# Patient Record
Sex: Female | Born: 1990 | Race: Black or African American | Hispanic: No | Marital: Single | Smoking: Former smoker
Health system: Southern US, Community
[De-identification: ages and names within clinical notes are randomized; demographics above are authoritative.]

## PROBLEM LIST (undated history)

## (undated) DIAGNOSIS — J45909 Unspecified asthma, uncomplicated: Secondary | ICD-10-CM

## (undated) DIAGNOSIS — R072 Precordial pain: Secondary | ICD-10-CM

## (undated) DIAGNOSIS — G919 Hydrocephalus, unspecified: Secondary | ICD-10-CM

## (undated) DIAGNOSIS — I619 Nontraumatic intracerebral hemorrhage, unspecified: Secondary | ICD-10-CM

## (undated) DIAGNOSIS — I509 Heart failure, unspecified: Secondary | ICD-10-CM

## (undated) DIAGNOSIS — Q282 Arteriovenous malformation of cerebral vessels: Secondary | ICD-10-CM

## (undated) DIAGNOSIS — I671 Cerebral aneurysm, nonruptured: Secondary | ICD-10-CM

## (undated) DIAGNOSIS — I639 Cerebral infarction, unspecified: Secondary | ICD-10-CM

## (undated) HISTORY — PX: VENTRICULOPERITONEAL SHUNT: SHX204

## (undated) HISTORY — DX: Arteriovenous malformation of cerebral vessels: Q28.2

## (undated) HISTORY — DX: Nontraumatic intracerebral hemorrhage, unspecified: I61.9

## (undated) HISTORY — DX: Precordial pain: R07.2

## (undated) HISTORY — DX: Cerebral infarction, unspecified: I63.9

## (undated) HISTORY — DX: Hydrocephalus, unspecified: G91.9

---
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
## 2010-02-18 ENCOUNTER — Emergency Department: Payer: Self-pay | Admitting: Emergency Medicine

## 2010-09-20 ENCOUNTER — Emergency Department: Payer: Self-pay | Admitting: Emergency Medicine

## 2011-01-15 ENCOUNTER — Emergency Department: Payer: Self-pay | Admitting: Unknown Physician Specialty

## 2012-12-25 ENCOUNTER — Emergency Department: Payer: Self-pay | Admitting: Emergency Medicine

## 2013-04-14 ENCOUNTER — Emergency Department: Payer: Self-pay | Admitting: Emergency Medicine

## 2014-02-27 ENCOUNTER — Emergency Department: Payer: Self-pay | Admitting: Emergency Medicine

## 2014-08-06 ENCOUNTER — Emergency Department: Payer: Self-pay | Admitting: Emergency Medicine

## 2014-09-19 ENCOUNTER — Emergency Department: Payer: Self-pay | Admitting: Emergency Medicine

## 2014-09-19 LAB — BASIC METABOLIC PANEL
Anion Gap: 7 (ref 7–16)
BUN: 13 mg/dL (ref 7–18)
CALCIUM: 8.2 mg/dL — AB (ref 8.5–10.1)
Chloride: 105 mmol/L (ref 98–107)
Co2: 28 mmol/L (ref 21–32)
Creatinine: 0.79 mg/dL (ref 0.60–1.30)
EGFR (Non-African Amer.): >60
Glucose: 90 mg/dL (ref 65–99)
Osmolality: 279 (ref 275–301)
Potassium: 3.7 mmol/L (ref 3.5–5.1)
SODIUM: 140 mmol/L (ref 136–145)

## 2014-09-19 LAB — CBC
HCT: 40.6 % (ref 35.0–47.0)
HGB: 13.4 g/dL (ref 12.0–16.0)
MCH: 29.7 pg (ref 26.0–34.0)
MCHC: 33.1 g/dL (ref 32.0–36.0)
MCV: 90 fL (ref 80–100)
Platelet: 284 10*3/uL (ref 150–440)
RBC: 4.52 10*6/uL (ref 3.80–5.20)
RDW: 13.3 % (ref 11.5–14.5)
WBC: 8.8 10*3/uL (ref 3.6–11.0)

## 2014-09-19 LAB — TROPONIN I: Troponin-I: <0.02 ng/mL

## 2015-04-18 ENCOUNTER — Encounter: Payer: Self-pay | Admitting: Emergency Medicine

## 2015-04-18 ENCOUNTER — Emergency Department
Admission: EM | Admit: 2015-04-18 | Discharge: 2015-04-18 | Disposition: A | Discharge status: Home / Self Care | Payer: Self-pay | Attending: Emergency Medicine | Admitting: Emergency Medicine

## 2015-04-18 ENCOUNTER — Emergency Department: Payer: Self-pay

## 2015-04-18 DIAGNOSIS — R519 Headache, unspecified: Secondary | ICD-10-CM

## 2015-04-18 DIAGNOSIS — R51 Headache: Secondary | ICD-10-CM | POA: Insufficient documentation

## 2015-04-18 DIAGNOSIS — M542 Cervicalgia: Secondary | ICD-10-CM | POA: Insufficient documentation

## 2015-04-18 HISTORY — DX: Heart failure, unspecified: I50.9

## 2015-04-18 HISTORY — DX: Cerebral aneurysm, nonruptured: I67.1

## 2015-04-18 MED ORDER — DIPHENHYDRAMINE HCL 50 MG/ML IJ SOLN
25.0000 mg | Freq: Once | INTRAMUSCULAR | Status: AC
  Administered 2015-04-18: 25 mg via INTRAVENOUS

## 2015-04-18 MED ORDER — DIPHENHYDRAMINE HCL 50 MG/ML IJ SOLN
INTRAMUSCULAR | Status: AC
  Filled 2015-04-18: qty 1

## 2015-04-18 MED ORDER — METOCLOPRAMIDE HCL 5 MG/ML IJ SOLN: 20.0000 mg | Freq: Once | INTRAVENOUS | Status: AC

## 2015-04-18 MED ORDER — METOCLOPRAMIDE HCL 5 MG/ML IJ SOLN
INTRAMUSCULAR | Status: AC
  Administered 2015-04-18: 20 mg
  Filled 2015-04-18: qty 4

## 2015-04-18 NOTE — ED Notes (Signed)
Pt reports that she has had a headache since Friday, now neck is hurting. Denies N/V/D or fever.

## 2015-04-18 NOTE — ED Notes (Signed)
Pt ambulating independently w/ steady gait on d/c in no acute distress, A&Ox4. D/c instructions reviewed w/ pt - pt denies any further questions or concerns at present.  

## 2015-04-18 NOTE — ED Provider Notes (Signed)
[facility] Emergency Department Provider Note  ____________________________________________  Time seen: On arrival  I have reviewed the triage vital signs and the nursing notes.   HISTORY  Chief Complaint Headache      HPI Anna Tapia is a 24 y.o. female who presents with a headache for 3 days. Patient reports a history of similar headaches several times but this when she "cannot shake ". She has a very distant history of a brain aneurysm. She denies any neuro deficits. No fevers no chills. No nausea no vomiting. No change in vision. No sick contacts. She describes the headache as global with pressure behind her eyes which is how it always is. It is moderate in nature     Past Medical History  Diagnosis Date  . CHF (congestive heart failure)   . Brain aneurysm     There are no active problems to display for this patient.   History reviewed. No pertinent past surgical history.  No current outpatient prescriptions on file.  Allergies Review of patient's allergies indicates no known allergies.  History reviewed. No pertinent family history.  Social History History  Substance Use Topics  . Smoking status: Never Smoker   . Smokeless tobacco: Not on file  . Alcohol Use: No    Review of Systems  Constitutional: Negative for fever. Eyes: Negative for visual changes. ENT: Negative for sore throat Cardiovascular: Negative for chest pain. Respiratory: Negative for shortness of breath. Gastrointestinal: Negative for abdominal pain, vomiting and diarrhea. Genitourinary: Negative for dysuria. Musculoskeletal: Negative for back pain. Positive for neck pain Skin: Negative for rash. Neurological: Negative for focal weakness, positive for headache   10-point ROS otherwise negative.  ____________________________________________   PHYSICAL EXAM:  VITAL SIGNS: ED Triage Vitals  Enc Vitals Group     BP 04/18/15 1500 115/70 mmHg   Pulse Rate 04/18/15 1500 76     Resp 04/18/15 1500 18     Temp --      Temp src --      SpO2 04/18/15 1500 99 %     Weight --      Height --      Head Cir --      Peak Flow --      Pain Score 04/18/15 1523 9     Pain Loc --      Pain Edu? --      Excl. in GC? --      Constitutional: Alert and oriented. Well appearing and in no distress. Eyes: Conjunctivae are normal. PERRL. ENT   Head: Normocephalic and atraumatic.   Nose: No rhinnorhea.   Mouth/Throat: Mucous membranes are moist. Cardiovascular: Normal rate, regular rhythm. Normal and symmetric distal pulses are present in all extremities. No murmurs, rubs, or gallops. Respiratory: Normal respiratory effort without tachypnea nor retractions. Breath sounds are clear and equal bilaterally.  Gastrointestinal: Soft and non-tender in all quadrants. No distention. There is no CVA tenderness. Genitourinary: deferred Musculoskeletal: Nontender with normal range of motion in all extremities. No lower extremity tenderness nor edema. Normal range of motion of her neck although she does have some tenderness to palpation at the insertion sites of the trapezius muscle bilaterally.  Neurologic:  Normal speech and language. No gross focal neurologic deficits are appreciated. Skin:  Skin is warm, dry and intact. No rash noted. Psychiatric: Mood and affect are normal. Patient exhibits appropriate insight and judgment.  ____________________________________________    LABS (pertinent positives/negatives)  Labs Reviewed - No data to  display  ____________________________________________   EKG  None  ____________________________________________    RADIOLOGY  CT with no acute distress  ____________________________________________   PROCEDURES  Procedure(s) performed: none  Critical Care performed: none  ____________________________________________   INITIAL IMPRESSION / ASSESSMENT AND PLAN / ED COURSE  Pertinent  labs & imaging results that were available during my care of the patient were reviewed by me and considered in my medical decision making (see chart for details).  Overall patient well-appearing. Neurologically completely intact. Given history we will obtain CT. We will treat with IV medications. ----------------------------------------- 6:11 PM on 04/18/2015 -----------------------------------------  Patient resting lightly in room. She reports her headache is much better ____________________________________________ ----------------------------------------- 7:04 PM on 04/18/2015 -----------------------------------------  Patient and/or asking to go home. She reports her headache is completely resolved  FINAL CLINICAL IMPRESSION(S) / ED DIAGNOSES  Final diagnoses:  Acute nonintractable headache, unspecified headache type     Jene Every, MD 04/18/15 1904

## 2015-04-18 NOTE — Discharge Instructions (Signed)

## 2015-12-04 ENCOUNTER — Emergency Department: Payer: Self-pay

## 2015-12-04 ENCOUNTER — Emergency Department
Admission: EM | Admit: 2015-12-04 | Discharge: 2015-12-04 | Disposition: A | Discharge status: Home / Self Care | Payer: Self-pay | Attending: Emergency Medicine | Admitting: Emergency Medicine

## 2015-12-04 DIAGNOSIS — R0789 Other chest pain: Secondary | ICD-10-CM | POA: Insufficient documentation

## 2015-12-04 DIAGNOSIS — I509 Heart failure, unspecified: Secondary | ICD-10-CM | POA: Insufficient documentation

## 2015-12-04 LAB — BASIC METABOLIC PANEL
ANION GAP: 7 (ref 5–15)
BUN: 7 mg/dL (ref 6–20)
CALCIUM: 8.9 mg/dL (ref 8.9–10.3)
CO2: 25 mmol/L (ref 22–32)
CREATININE: 0.71 mg/dL (ref 0.44–1.00)
Chloride: 106 mmol/L (ref 101–111)
GFR calc Af Amer: >60 mL/min (ref >60)
Glucose, Bld: 79 mg/dL (ref 65–99)
Potassium: 3.4 mmol/L — ABNORMAL LOW (ref 3.5–5.1)
Sodium: 138 mmol/L (ref 135–145)

## 2015-12-04 LAB — CBC
HCT: 41.5 % (ref 35.0–47.0)
Hemoglobin: 13.5 g/dL (ref 12.0–16.0)
MCH: 29.1 pg (ref 26.0–34.0)
MCHC: 32.6 g/dL (ref 32.0–36.0)
MCV: 89.2 fL (ref 80.0–100.0)
PLATELETS: 221 10*3/uL (ref 150–440)
RBC: 4.65 MIL/uL (ref 3.80–5.20)
RDW: 13.1 % (ref 11.5–14.5)
WBC: 5.2 10*3/uL (ref 3.6–11.0)

## 2015-12-04 LAB — TROPONIN I

## 2015-12-04 LAB — BRAIN NATRIURETIC PEPTIDE: B NATRIURETIC PEPTIDE 5: 58 pg/mL (ref 0.0–100.0)

## 2015-12-04 MED ORDER — NAPROXEN 500 MG PO TABS: 500.0000 mg | ORAL_TABLET | Freq: Two times a day (BID) | ORAL | Status: DC

## 2015-12-04 MED ORDER — KETOROLAC TROMETHAMINE 30 MG/ML IJ SOLN
30.0000 mg | Freq: Once | INTRAMUSCULAR | Status: AC
  Administered 2015-12-04: 30 mg via INTRAMUSCULAR
  Filled 2015-12-04: qty 1

## 2015-12-04 NOTE — Discharge Instructions (Signed)

## 2015-12-04 NOTE — ED Notes (Addendum)
Pt c/o left sided chest pain X 2 days that is constant.  Also c/o SHOB.  NAD in triage. Reports pain will get dull but is always present. Pt has hx heart failure.

## 2015-12-04 NOTE — ED Provider Notes (Signed)
[facility] Emergency Department Provider Note  ____________________________________________    I have reviewed the triage vital signs and the nursing notes.   HISTORY  Chief Complaint Chest Pain    HPI CHAMARA Anna Tapia is a 25 y.o. female who presents with complaints of chest pain. She attributes this to lifting heavy objects at work over the last 2 days. She reports the pain is worse when she flexes her arms or pushes against things. She apparently reports a history of CHF although she is unable to tell me more about this. She denies fevers or chills. No cough. No shortness of breath. No recent travel. No calf pain or swelling. She has not taken anything for the pain     Past Medical History  Diagnosis Date  . CHF (congestive heart failure)   . Brain aneurysm     There are no active problems to display for this patient.   No past surgical history on file.  Current Outpatient Rx  Name  Route  Sig  Dispense  Refill  . naproxen (NAPROSYN) 500 MG tablet   Oral   Take 1 tablet (500 mg total) by mouth 2 (two) times daily with a meal.   20 tablet   2     Allergies Review of patient's allergies indicates no known allergies.  No family history on file.  Social History Social History  Substance Use Topics  . Smoking status: Never Smoker   . Smokeless tobacco: Not on file  . Alcohol Use: No    Review of Systems  Constitutional: Negative for fever. Eyes: Negative for visual changes. ENT: Negative for sore throat Cardiovascular: As above Respiratory: Negative for shortness of breath. Gastrointestinal: Negative for abdominal pain, Genitourinary: Negative for dysuria. Musculoskeletal: Negative for back pain. Skin: Negative for rash. Neurological: Negative for headaches  Psychiatric: No anxiety    ____________________________________________   PHYSICAL EXAM:  VITAL SIGNS: ED Triage Vitals  Enc Vitals Group     BP 12/04/15 1425  131/91 mmHg     Pulse Rate 12/04/15 1425 62     Resp 12/04/15 1425 16     Temp 12/04/15 1425 98.2 F (36.8 C)     Temp Source 12/04/15 1425 Oral     SpO2 12/04/15 1425 100 %     Weight 12/04/15 1425 135 lb (61.236 kg)     Height 12/04/15 1425  (1.549 m)     Head Cir --      Peak Flow --      Pain Score 12/04/15 1427 8     Pain Loc --      Pain Edu? --      Excl. in GC? --      Constitutional: Alert and oriented. Well appearing and in no distress. Eyes: Conjunctivae are normal.  ENT   Head: Normocephalic and atraumatic.   Mouth/Throat: Mucous membranes are moist. Cardiovascular: Normal rate, regular rhythm. Normal and symmetric distal pulses are present in all extremities. Patient has pectoralis muscle tenderness bilaterally which replicates her pain exactly., This pain is worse with extension of her arms Respiratory: Normal respiratory effort without tachypnea nor retractions. Breath sounds are clear and equal bilaterally.  Gastrointestinal: Soft and non-tender in all quadrants. No distention. There is no CVA tenderness. Genitourinary: deferred Musculoskeletal: Nontender with normal range of motion in all extremities. No lower extremity tenderness nor edema. Neurologic:  Normal speech and language. No gross focal neurologic deficits are appreciated. Skin:  Skin is warm, dry and  intact. No rash noted. Psychiatric: Mood and affect are normal. Patient exhibits appropriate insight and judgment.  ____________________________________________    LABS (pertinent positives/negatives)  Labs Reviewed  BASIC METABOLIC PANEL - Abnormal; Notable for the following:    Potassium 3.4 (*)    All other components within normal limits  CBC  TROPONIN I  BRAIN NATRIURETIC PEPTIDE    ____________________________________________   EKG  ED ECG REPORT I, Jene Every, the attending physician, personally viewed and interpreted this ECG.  Date: 12/04/2015 EKG Time: 2:22  PM Rate: 67 Rhythm: normal sinus rhythm QRS Axis: normal Intervals: normal ST/T Wave abnormalities: normal Conduction Disturbances: none Narrative Interpretation: unremarkable   ____________________________________________    RADIOLOGY I have personally reviewed any xrays that were ordered on this patient: Chest x-ray unremarkable  ____________________________________________   PROCEDURES  Procedure(s) performed: none  Critical Care performed: none  ____________________________________________   INITIAL IMPRESSION / ASSESSMENT AND PLAN / ED COURSE  Pertinent labs & imaging results that were available during my care of the patient were reviewed by me and considered in my medical decision making (see chart for details).  Patient well-appearing and in no distress. Her labs and chest x-ray are benign. Her exam is consistent with chest wall pain. Toradol 30 mg IM given.  She did report significant improvement of this. I will discharge her with NSAIDs and a work note  ____________________________________________   FINAL CLINICAL IMPRESSION(S) / ED DIAGNOSES  Final diagnoses:  Acute chest wall pain     Jene Every, MD 12/04/15 2138

## 2016-08-16 ENCOUNTER — Emergency Department
Admission: EM | Admit: 2016-08-16 | Discharge: 2016-08-16 | Disposition: A | Discharge status: Home / Self Care | Payer: Self-pay | Attending: Emergency Medicine | Admitting: Emergency Medicine

## 2016-08-16 ENCOUNTER — Encounter: Payer: Self-pay | Admitting: Medical Oncology

## 2016-08-16 DIAGNOSIS — M62830 Muscle spasm of back: Secondary | ICD-10-CM | POA: Insufficient documentation

## 2016-08-16 DIAGNOSIS — M25511 Pain in right shoulder: Secondary | ICD-10-CM | POA: Insufficient documentation

## 2016-08-16 DIAGNOSIS — I509 Heart failure, unspecified: Secondary | ICD-10-CM | POA: Insufficient documentation

## 2016-08-16 MED ORDER — NAPROXEN 500 MG PO TABS: 500.0000 mg | ORAL_TABLET | Freq: Two times a day (BID) | ORAL | 0 refills | Status: DC

## 2016-08-16 MED ORDER — CYCLOBENZAPRINE HCL 10 MG PO TABS
10.0000 mg | ORAL_TABLET | Freq: Three times a day (TID) | ORAL | 0 refills | Status: DC | PRN
Start: 2016-08-16 — End: 2019-06-11

## 2016-08-16 NOTE — ED Triage Notes (Signed)
Pt reports that she woke up yesterday with mid back pain. Denies injury but reports she has been sleeping on an air mattress that is uncomfortable. Pt denies sob/cough. Pt reports pain worsens upon movement.

## 2016-08-16 NOTE — ED Provider Notes (Signed)
[facility] Emergency Department Provider Note  ____________________________________________  Time seen: Approximately 5:28 PM  I have reviewed the triage vital signs and the nursing notes.   HISTORY  Chief Complaint Back Pain    HPI Anna Tapia is a 25 y.o. female, NAD, presents to emergency with one-day history of upper back pain and tightness. States she has been sleeping on a mattress at her aunt's house over the last month. Woke yesterday with significant tightness between her shoulder blades and up to her shoulders that caused her to have decreased range of motion of her neck. Was unable to go to work due to such. Placed a over-the-counter heating patch on the area which seemed to decrease the pain and tightness allowing her to be able to move her neck. Denies any injury or trauma to the neck or back. Has had no fevers, chills, body aches. Denies chest pain or shortness of breath. Has not taken anything else over-the-counter for her pain. Has not noted any skin sores, redness or swelling. No rashes. No numbness, weakness, tingling of the upper extremities.   Past Medical History:  Diagnosis Date  . Brain aneurysm   . CHF (congestive heart failure) (HCC)     There are no active problems to display for this patient.   Past Surgical History:  Procedure Laterality Date  . VENTRICULOPERITONEAL SHUNT      Prior to Admission medications   Medication Sig Start Date End Date Taking? Authorizing Provider  cyclobenzaprine (FLEXERIL) 10 MG tablet Take 1 tablet (10 mg total) by mouth 3 (three) times daily as needed for muscle spasms. 08/16/16   Ashe Graybeal L Signa Cheek, PA-C  naproxen (NAPROSYN) 500 MG tablet Take 1 tablet (500 mg total) by mouth 2 (two) times daily with a meal. 08/16/16   Karen Huhta L Victorious Cosio, PA-C    Allergies Review of patient's allergies indicates no known allergies.  No family history on file.  Social History Social History  Substance Use  Topics  . Smoking status: Never Smoker  . Smokeless tobacco: Not on file  . Alcohol use No     Review of Systems Constitutional: No fever/chills Cardiovascular: No chest pain. Respiratory: No shortness of breath.  Musculoskeletal: Negative for upper back, scapular and shoulder pain and tightness.  Skin: Negative for rash, redness, swelling, skin sores. Neurological: Negative for numbness, weakness, tingling. 10-point ROS otherwise negative.  ____________________________________________   PHYSICAL EXAM:  VITAL SIGNS: ED Triage Vitals  Enc Vitals Group     BP 08/16/16 1646 125/81     Pulse Rate 08/16/16 1646 87     Resp 08/16/16 1646 16     Temp 08/16/16 1646 98 F (36.7 C)     Temp Source 08/16/16 1646 Oral     SpO2 08/16/16 1646 100 %     Weight 08/16/16 1647 130 lb (59 kg)     Height 08/16/16 1647 5' (1.524 m)     Head Circumference --      Peak Flow --      Pain Score 08/16/16 1647 8     Pain Loc --      Pain Edu? --      Excl. in GC? --      Constitutional: Alert and oriented. Well appearing and in no acute distress. Eyes: Conjunctivae are normal. Head: Atraumatic. Neck: Supple with full range of motion. No cervical spine tenderness to palpation. Bilateral moderate trapezial muscle spasms with tenderness to palpation. Hematological/Lymphatic/Immunilogical: No cervical lymphadenopathy. Cardiovascular: Good  peripheral circulation. Respiratory: Normal respiratory effort without tachypnea or retractions.  Musculoskeletal: No tenderness to palpation midline of the thoracic or lumbar spine. Significant muscle spasm noted about the right scapular region with tenderness to palpation. Full range of motion of bilateral upper extremities without pain or difficulty. Negative Apley's. Neurologic:  Normal speech and language. No gross focal neurologic deficits are appreciated.  Skin:  Skin is warm, dry and intact. No rash, redness, swelling, skin sores noted. Psychiatric:  Mood and affect are normal. Speech and behavior are normal. Patient exhibits appropriate insight and judgement.   ____________________________________________   LABS  None ____________________________________________  EKG  None ____________________________________________  RADIOLOGY  None ____________________________________________    PROCEDURES  Procedure(s) performed: None   Procedures   Medications - No data to display   ____________________________________________   INITIAL IMPRESSION / ASSESSMENT AND PLAN / ED COURSE  Pertinent labs & imaging results that were available during my care of the patient were reviewed by me and considered in my medical decision making (see chart for details).  Clinical Course    Patient's diagnosis is consistent with Muscle spasm of upper back. Patient will be discharged home with prescriptions for Flexeril and Naprosyn to take a stretcher. Patient is to continue to apply warm heat to the affected areas 20 minutes 3-4 times daily as needed. Patient may also completing light range of motion stretching exercises as discussed. [facility] if symptoms persist past this treatment course. Patient is given ED precautions to return to the ED for any worsening or new symptoms.   ____________________________________________  FINAL CLINICAL IMPRESSION(S) / ED DIAGNOSES  Final diagnoses:  Muscle spasm of back      NEW MEDICATIONS STARTED DURING THIS VISIT:  Discharge Medication List as of 08/16/2016  5:36 PM    START taking these medications   Details  cyclobenzaprine (FLEXERIL) 10 MG tablet Take 1 tablet (10 mg total) by mouth 3 (three) times daily as needed for muscle spasms., Starting Fri 08/16/2016, Print             Ernestene KielJami L SpragueHagler, PA-C 08/16/16 1755    Jennye MoccasinBrian S Quigley, MD 08/16/16 [phone]

## 2016-08-16 NOTE — ED Notes (Signed)
Pt states upper back pain since yesterday, states she sleeps on an air mattress and believe that's what caused the pain, pt ambulatory to triage

## 2016-10-25 ENCOUNTER — Emergency Department
Admission: EM | Admit: 2016-10-25 | Discharge: 2016-10-25 | Disposition: A | Discharge status: Home / Self Care | Payer: Self-pay | Attending: Emergency Medicine | Admitting: Emergency Medicine

## 2016-10-25 ENCOUNTER — Encounter: Payer: Self-pay | Admitting: Emergency Medicine

## 2016-10-25 DIAGNOSIS — Z79899 Other long term (current) drug therapy: Secondary | ICD-10-CM | POA: Insufficient documentation

## 2016-10-25 DIAGNOSIS — R519 Headache, unspecified: Secondary | ICD-10-CM

## 2016-10-25 DIAGNOSIS — R51 Headache: Secondary | ICD-10-CM | POA: Insufficient documentation

## 2016-10-25 DIAGNOSIS — I509 Heart failure, unspecified: Secondary | ICD-10-CM | POA: Insufficient documentation

## 2016-10-25 LAB — POCT PREGNANCY, URINE: PREG TEST UR: NEGATIVE

## 2016-10-25 MED ORDER — METOCLOPRAMIDE HCL 5 MG/ML IJ SOLN
10.0000 mg | Freq: Once | INTRAMUSCULAR | Status: AC
  Administered 2016-10-25: 10 mg via INTRAVENOUS
  Filled 2016-10-25: qty 2

## 2016-10-25 MED ORDER — METOCLOPRAMIDE HCL 10 MG PO TABS: 10.0000 mg | ORAL_TABLET | Freq: Three times a day (TID) | ORAL | 1 refills | Status: DC | PRN

## 2016-10-25 MED ORDER — KETOROLAC TROMETHAMINE 30 MG/ML IJ SOLN
15.0000 mg | Freq: Once | INTRAMUSCULAR | Status: AC
  Administered 2016-10-25: 15 mg via INTRAVENOUS
  Filled 2016-10-25: qty 1

## 2016-10-25 MED ORDER — TRAMADOL HCL 50 MG PO TABS
50.0000 mg | ORAL_TABLET | Freq: Once | ORAL | Status: AC
  Administered 2016-10-25: 50 mg via ORAL
  Filled 2016-10-25: qty 1

## 2016-10-25 MED ORDER — SODIUM CHLORIDE 0.9 % IV BOLUS (SEPSIS)
1000.0000 mL | Freq: Once | INTRAVENOUS | Status: AC
  Administered 2016-10-25: 1000 mL via INTRAVENOUS

## 2016-10-25 MED ORDER — TRAMADOL HCL 50 MG PO TABS: 50.0000 mg | ORAL_TABLET | Freq: Four times a day (QID) | ORAL | 0 refills | Status: DC | PRN

## 2016-10-25 NOTE — ED Triage Notes (Signed)
Pt reports headache since last Thursday. Pt reports hx of brain aneurism which was diagnosed at birth along with CHF. Pt reports no primary care since ten+ years ago. Pt is ambulatory to triage with NAD noted at this time.

## 2016-10-25 NOTE — ED Notes (Signed)
Pt reports headache pain at 2/10

## 2016-10-25 NOTE — ED Notes (Signed)
Verified Pt is not driving herself home prior to administration of Tramadol; pt confirms her aunt will come and pick her up

## 2016-10-25 NOTE — ED Notes (Signed)
Pt is in good condition; discharge instructions reviewed, follow up care and home care reviewed; prescription medication reviewed; pt verbalized understanding; pt is ambulatory and went home with aunt

## 2016-10-25 NOTE — ED Notes (Signed)
Per MD order, verified with patient the decision to not do the CT scan

## 2016-10-25 NOTE — ED Provider Notes (Signed)
Time Seen: Approximately 2012  I have reviewed the triage notes  Chief Complaint: Headache   History of Present Illness: Anna Tapia is a 25 y.o. female who presents with a persistent left-sided headache with some nausea that she's had over the last several days. Patient states similar headaches in the past and review of the records shows that she was here to this emergency department received a head CT which was negative. She states this headache is typical of headaches that she's had before in the past. She has a remote history of a aneurysm that [facility] was [facility]. She states she did not have insurance so she did not continue follow-up. She states she's had it since birth? She denies any photophobia, blurred vision or loss of vision. She states she did see some floaters yesterday. She denies any chest pain or shortness of breath. No focal neurologic deficits. No midline neck pain or rigidity. No fever or head trauma   Past Medical History:  Diagnosis Date  . Brain aneurysm   . CHF (congestive heart failure) (HCC)     There are no active problems to display for this patient.   Past Surgical History:  Procedure Laterality Date  . VENTRICULOPERITONEAL SHUNT      Past Surgical History:  Procedure Laterality Date  . VENTRICULOPERITONEAL SHUNT      Current Outpatient Rx  . Order #: [id number] Class: Print  . Order #: [id number] Class: Print    Allergies:  Patient has no known allergies.  Family History: No family history on file.  Social History: Social History  Substance Use Topics  . Smoking status: Never Smoker  . Smokeless tobacco: Never Used  . Alcohol use No     Review of Systems:   10 point review of systems was performed and was otherwise negative:  Constitutional: No fever Eyes: No visual disturbances ENT: No sore throat, ear pain Cardiac: No chest pain Respiratory: No shortness of breath, wheezing, or  stridor Abdomen: No abdominal pain, no vomiting, No diarrhea Endocrine: No weight loss, No night sweats Extremities: No peripheral edema, cyanosis Skin: No rashes, easy bruising Neurologic: No focal weakness, trouble with speech or swollowing Urologic: No dysuria, Hematuria, or urinary frequency   Physical Exam:  ED Triage Vitals  Enc Vitals Group     BP 10/25/16 1917 (!) 145/97     Pulse Rate 10/25/16 1917 76     Resp --      Temp 10/25/16 1917 98.6 F (37 C)     Temp Source 10/25/16 1917 Oral     SpO2 10/25/16 1917 100 %     Weight 10/25/16 1922 125 lb (56.7 kg)     Height 10/25/16 1922 5' (1.524 m)     Head Circumference --      Peak Flow --      Pain Score 10/25/16 1922 8     Pain Loc --      Pain Edu? --      Excl. in GC? --     General: Awake , Alert , and Oriented times 3; GCS 15 Head: Normal cephalic , atraumatic. No obvious reproducible component to her headache Eyes: Pupils equal , round, reactive to light. No papilledema Nose/Throat: No nasal drainage, patent upper airway without erythema or exudate.  Neck: Supple, Full range of motion, No anterior adenopathy or palpable thyroid masses. No meningeal signs Lungs: Clear to ascultation without wheezes , rhonchi, or rales Heart: Regular rate, regular  rhythm without murmurs , gallops , or rubs Abdomen: Soft, non tender without rebound, guarding , or rigidity; bowel sounds positive and symmetric in all 4 quadrants. No organomegaly .        Extremities: 2 plus symmetric pulses. No edema, clubbing or cyanosis Neurologic: normal ambulation, Motor symmetric without deficits, sensory intact Skin: warm, dry, no rashes   Labs:   All laboratory work was reviewed including any pertinent negatives or positives listed below:  Labs Reviewed  POC URINE PREG, ED  POCT PREGNANCY, URINE    ED Course:  Patient received treatment for directed toward migraine headache with a liter of fluid along with IV Reglan. Patient received  IV Toradol and had some symptomatic relief and then had by mouth Ultram. Patient was asked initially whether or not she wanted to proceed with a head CT. I did not have a strong clinical suspicion this was a subarachnoid hemorrhage, meningitis, encephalitis, or any other life-threatening cause for her headache but stated that we would be glad to do head CT, spinal tap if necessary etc. Patient lately active bleed declines and we agreed to treat symptomatically. Clinical Course      Assessment: Acute unspecified cephalgia      Plan Outpatient Patient was advised to return immediately if condition worsens. Patient was advised to follow up with their primary care physician or other specialized physicians involved in their outpatient care. The patient and/or family member/power of attorney had laboratory results reviewed at the bedside. All questions and concerns were addressed and appropriate discharge instructions were distributed by the nursing staff.           Jennye MoccasinBrian S Ayat Drenning, MD 10/25/16 2129

## 2016-10-25 NOTE — Discharge Instructions (Signed)
Please return immediately if condition worsens. Please contact her primary physician or the physician you were given for referral. If you have any specialist physicians involved in her treatment and plan please also contact them. Thank you for using [place] [facility]. ° °

## 2017-04-17 ENCOUNTER — Emergency Department
Admission: EM | Admit: 2017-04-17 | Discharge: 2017-04-17 | Disposition: A | Discharge status: Home / Self Care | Payer: Self-pay | Attending: Emergency Medicine | Admitting: Emergency Medicine

## 2017-04-17 ENCOUNTER — Emergency Department: Payer: Self-pay

## 2017-04-17 DIAGNOSIS — R110 Nausea: Secondary | ICD-10-CM | POA: Insufficient documentation

## 2017-04-17 DIAGNOSIS — F17200 Nicotine dependence, unspecified, uncomplicated: Secondary | ICD-10-CM | POA: Insufficient documentation

## 2017-04-17 DIAGNOSIS — I509 Heart failure, unspecified: Secondary | ICD-10-CM | POA: Insufficient documentation

## 2017-04-17 DIAGNOSIS — R079 Chest pain, unspecified: Secondary | ICD-10-CM | POA: Insufficient documentation

## 2017-04-17 LAB — URINALYSIS, COMPLETE (UACMP) WITH MICROSCOPIC
Bilirubin Urine: NEGATIVE
GLUCOSE, UA: NEGATIVE mg/dL
KETONES UR: 5 mg/dL — AB
LEUKOCYTES UA: NEGATIVE
Nitrite: NEGATIVE
PH: 6 (ref 5.0–8.0)
Protein, ur: NEGATIVE mg/dL
SPECIFIC GRAVITY, URINE: 1.025 (ref 1.005–1.030)

## 2017-04-17 LAB — CBC
HCT: 38.4 % (ref 35.0–47.0)
HEMOGLOBIN: 13 g/dL (ref 12.0–16.0)
MCH: 30.8 pg (ref 26.0–34.0)
MCHC: 33.9 g/dL (ref 32.0–36.0)
MCV: 90.8 fL (ref 80.0–100.0)
PLATELETS: 271 10*3/uL (ref 150–440)
RBC: 4.23 MIL/uL (ref 3.80–5.20)
RDW: 13 % (ref 11.5–14.5)
WBC: 7.8 10*3/uL (ref 3.6–11.0)

## 2017-04-17 LAB — FIBRIN DERIVATIVES D-DIMER (ARMC ONLY): Fibrin derivatives D-dimer (ARMC): 121.42 (ref 0.00–499.00)

## 2017-04-17 LAB — BASIC METABOLIC PANEL
ANION GAP: 6 (ref 5–15)
BUN: 11 mg/dL (ref 6–20)
CALCIUM: 8.8 mg/dL — AB (ref 8.9–10.3)
CO2: 26 mmol/L (ref 22–32)
CREATININE: 0.68 mg/dL (ref 0.44–1.00)
Chloride: 104 mmol/L (ref 101–111)
Glucose, Bld: 85 mg/dL (ref 65–99)
Potassium: 3.6 mmol/L (ref 3.5–5.1)
Sodium: 136 mmol/L (ref 135–145)

## 2017-04-17 LAB — TROPONIN I: Troponin I: <0.03 ng/mL (ref <0.03)

## 2017-04-17 LAB — POCT PREGNANCY, URINE: Preg Test, Ur: NEGATIVE

## 2017-04-17 MED ORDER — KETOROLAC TROMETHAMINE 10 MG PO TABS: 10.0000 mg | ORAL_TABLET | Freq: Three times a day (TID) | ORAL | 0 refills | Status: DC | PRN

## 2017-04-17 MED ORDER — KETOROLAC TROMETHAMINE 30 MG/ML IJ SOLN
30.0000 mg | Freq: Once | INTRAMUSCULAR | Status: AC
  Administered 2017-04-17: 30 mg via INTRAVENOUS

## 2017-04-17 NOTE — ED Notes (Signed)

## 2017-04-17 NOTE — ED Notes (Signed)
MD Brown at bedside at this time.  

## 2017-04-17 NOTE — ED Provider Notes (Signed)
[facility] Emergency Department Provider Note    First MD Initiated Contact with Patient 04/17/17 [phone]     (approximate)  I have reviewed the triage vital signs and the nursing notes.   HISTORY  Chief Complaint Chest Pain   HPI Anna Tapia is a 26 y.o. female with below list of chronic medical conditions including congenital heart disease presents to the emergency department with central/left side sharp nonradiating chest pain that started 7 PM last night. Patient admits to nausea however no vomiting. Patient admits to dyspnea however denies any diaphoresis. Patient states her pain score is 9 out of 10 at present. Patient denies any weakness numbness gait instability or visual changes. Patient does admit to a headache as well.   Past Medical History:  Diagnosis Date  . Brain aneurysm   . CHF (congestive heart failure) (HCC)     There are no active problems to display for this patient.   Past Surgical History:  Procedure Laterality Date  . VENTRICULOPERITONEAL SHUNT      Prior to Admission medications   Medication Sig Start Date End Date Taking? Authorizing Provider  cyclobenzaprine (FLEXERIL) 10 MG tablet Take 1 tablet (10 mg total) by mouth 3 (three) times daily as needed for muscle spasms. 08/16/16   Hagler, Jami L, PA-C  metoCLOPramide (REGLAN) 10 MG tablet Take 1 tablet (10 mg total) by mouth every 8 (eight) hours as needed for nausea. 10/25/16 11/04/16  Jennye Moccasin, MD  naproxen (NAPROSYN) 500 MG tablet Take 1 tablet (500 mg total) by mouth 2 (two) times daily with a meal. 08/16/16   Hagler, Jami L, PA-C  traMADol (ULTRAM) 50 MG tablet Take 1 tablet (50 mg total) by mouth every 6 (six) hours as needed. 10/25/16   Jennye Moccasin, MD    Allergies No known drug allergies No family history on file.  Social History Social History  Substance Use Topics  . Smoking status: Current Every Day Smoker  . Smokeless tobacco: Never Used    . Alcohol use No    Review of Systems Constitutional: No fever/chills Eyes: No visual changes. ENT: No sore throat. Cardiovascular: Positive for chest pain. Respiratory: Denies shortness of breath. Gastrointestinal: No abdominal pain.  No nausea, no vomiting.  No diarrhea.  No constipation. Genitourinary: Negative for dysuria. Musculoskeletal: Negative for neck pain.  Negative for back pain. Integumentary: Negative for rash. Neurological: Negative for headaches, focal weakness or numbness.   ____________________________________________   PHYSICAL EXAM:  VITAL SIGNS: ED Triage Vitals  Enc Vitals Group     BP 04/17/17 0430 121/79     Pulse Rate 04/17/17 0430 (!) 59     Resp 04/17/17 0430 (!) 25     Temp --      Temp src --      SpO2 04/17/17 0430 94 %     Weight 04/17/17 0220 54.4 kg (120 lb)     Height 04/17/17 0220 1.549 m (5\' 1" )     Head Circumference --      Peak Flow --      Pain Score 04/17/17 0219 9     Pain Loc --      Pain Edu? --    Constitutional: Alert and oriented. Well appearing and in no acute distress. Eyes: Conjunctivae are normal.  Head: Atraumatic. Nose: No congestion/rhinnorhea. Mouth/Throat: Mucous membranes are moist. Oropharynx non-erythematous. Neck: No stridor.   Cardiovascular: Normal rate, regular rhythm. Good peripheral circulation. Grossly normal heart sounds.Pain  with gentle left parasternal palpation Respiratory: Normal respiratory effort.  No retractions. Lungs CTAB. Gastrointestinal: Soft and nontender. No distention.  Musculoskeletal: No lower extremity tenderness nor edema. No gross deformities of extremities. Neurologic:  Normal speech and language. No gross focal neurologic deficits are appreciated.  Skin:  Skin is warm, dry and intact. No rash noted. Psychiatric: Mood and affect are normal. Speech and behavior are normal.  ____________________________________________   LABS (all labs ordered are listed, but only abnormal  results are displayed)  Labs Reviewed  BASIC METABOLIC PANEL - Abnormal; Notable for the following:       Result Value   Calcium 8.8 (*)    All other components within normal limits  URINALYSIS, COMPLETE (UACMP) WITH MICROSCOPIC - Abnormal; Notable for the following:    Color, Urine YELLOW (*)    APPearance HAZY (*)    Hgb urine dipstick SMALL (*)    Ketones, ur 5 (*)    Bacteria, UA RARE (*)    Squamous Epithelial / LPF 6-30 (*)    All other components within normal limits  CBC  TROPONIN I  FIBRIN DERIVATIVES D-DIMER ([facility] ONLY)  TROPONIN I  POC URINE PREG, ED  POCT PREGNANCY, URINE   ____________________________________________  EKG  ED ECG REPORT I, [place] N Drema Eddington, the attending physician, personally viewed and interpreted this ECG.   Date: 04/17/2017  EKG Time: 2:18 AM  Rate: 80  Rhythm: Normal sinus rhythm  Axis: Normal  Intervals: Normal  ST&T Change: None  ____________________________________________  RADIOLOGY I, [place] N Wayland Baik, personally viewed and evaluated these images (plain radiographs) as part of my medical decision making, as well as reviewing the written report by the radiologist.  Dg Chest 2 View  Result Date: 04/17/2017 CLINICAL DATA:  Centralized left-sided chest pain EXAM: CHEST  2 VIEW COMPARISON:  12/04/2015 FINDINGS: No focal pulmonary infiltrate, consolidation or effusion. Mild cardiomegaly without edema. No pneumothorax. IMPRESSION: The heart appears slightly enlarged.  No edema or infiltrate. Electronically Signed   By: Jasmine PangKim  Fujinaga M.D.   On: 04/17/2017 02:54      Procedures   ____________________________________________   INITIAL IMPRESSION / ASSESSMENT AND PLAN / ED COURSE  Pertinent labs & imaging results that were available during my care of the patient were reviewed by me and considered in my medical decision making (see chart for details).  She does laboratory data unremarkable including troponin 2 and d-dimer. EKG  revealed no evidence of ischemia or infarction. Patient given IV Toradol with marked improvement of pain. Patient be referred to Dr. Vennie [place] cardiologist.      ____________________________________________  FINAL CLINICAL IMPRESSION(S) / ED DIAGNOSES  Final diagnoses:  Chest pain, unspecified type     MEDICATIONS GIVEN DURING THIS VISIT:  Medications  ketorolac (TORADOL) 30 MG/ML injection 30 mg (30 mg Intravenous Given 04/17/17 0452)     NEW OUTPATIENT MEDICATIONS STARTED DURING THIS VISIT:  New Prescriptions   No medications on file    Modified Medications   No medications on file    Discontinued Medications   No medications on file     Note:  This document was prepared using Dragon voice recognition software and may include unintentional dictation errors.    Darci CurrentBrown,  N, MD 04/17/17 [phone]

## 2017-04-17 NOTE — ED Triage Notes (Addendum)
Pt presents to ED via POV with c/o centralized/left-sided CP without radiation that started around 7pm last night. +nausea, -vomiting, +SHOB, -diaphoresis. Pt also c/o generalized headache that started around the same time. Pt is A&O, in NAD; RR even, regular, and unlabored.

## 2017-09-22 ENCOUNTER — Encounter: Payer: Self-pay | Admitting: *Deleted

## 2017-09-22 ENCOUNTER — Emergency Department: Payer: Self-pay

## 2017-09-22 ENCOUNTER — Emergency Department
Admission: EM | Admit: 2017-09-22 | Discharge: 2017-09-22 | Disposition: A | Discharge status: Home / Self Care | Payer: Self-pay | Attending: Emergency Medicine | Admitting: Emergency Medicine

## 2017-09-22 DIAGNOSIS — R519 Headache, unspecified: Secondary | ICD-10-CM

## 2017-09-22 DIAGNOSIS — Z982 Presence of cerebrospinal fluid drainage device: Secondary | ICD-10-CM | POA: Insufficient documentation

## 2017-09-22 DIAGNOSIS — F17210 Nicotine dependence, cigarettes, uncomplicated: Secondary | ICD-10-CM | POA: Insufficient documentation

## 2017-09-22 DIAGNOSIS — R51 Headache: Secondary | ICD-10-CM | POA: Insufficient documentation

## 2017-09-22 DIAGNOSIS — I509 Heart failure, unspecified: Secondary | ICD-10-CM | POA: Insufficient documentation

## 2017-09-22 DIAGNOSIS — R079 Chest pain, unspecified: Secondary | ICD-10-CM | POA: Insufficient documentation

## 2017-09-22 DIAGNOSIS — T85618A Breakdown (mechanical) of other specified internal prosthetic devices, implants and grafts, initial encounter: Secondary | ICD-10-CM

## 2017-09-22 LAB — BASIC METABOLIC PANEL
Anion gap: 8 (ref 5–15)
BUN: 9 mg/dL (ref 6–20)
CALCIUM: 8.9 mg/dL (ref 8.9–10.3)
CO2: 24 mmol/L (ref 22–32)
Chloride: 105 mmol/L (ref 101–111)
Creatinine, Ser: 0.72 mg/dL (ref 0.44–1.00)
GFR calc Af Amer: >60 mL/min (ref >60)
GFR calc non Af Amer: >60 mL/min (ref >60)
GLUCOSE: 86 mg/dL (ref 65–99)
Potassium: 3.5 mmol/L (ref 3.5–5.1)
Sodium: 137 mmol/L (ref 135–145)

## 2017-09-22 LAB — CBC
HCT: 38.1 % (ref 35.0–47.0)
Hemoglobin: 12.8 g/dL (ref 12.0–16.0)
MCH: 30.7 pg (ref 26.0–34.0)
MCHC: 33.7 g/dL (ref 32.0–36.0)
MCV: 91 fL (ref 80.0–100.0)
Platelets: 234 10*3/uL (ref 150–440)
RBC: 4.18 MIL/uL (ref 3.80–5.20)
RDW: 12.7 % (ref 11.5–14.5)
WBC: 5.8 10*3/uL (ref 3.6–11.0)

## 2017-09-22 LAB — POCT PREGNANCY, URINE: Preg Test, Ur: NEGATIVE

## 2017-09-22 LAB — BRAIN NATRIURETIC PEPTIDE: B Natriuretic Peptide: 63 pg/mL (ref 0.0–100.0)

## 2017-09-22 LAB — TROPONIN I

## 2017-09-22 MED ORDER — KETOROLAC TROMETHAMINE 30 MG/ML IJ SOLN
30.0000 mg | Freq: Once | INTRAMUSCULAR | Status: AC
  Administered 2017-09-22: 30 mg via INTRAVENOUS
  Filled 2017-09-22: qty 1

## 2017-09-22 MED ORDER — METOCLOPRAMIDE HCL 5 MG/ML IJ SOLN
10.0000 mg | Freq: Once | INTRAMUSCULAR | Status: AC
  Administered 2017-09-22: 10 mg via INTRAVENOUS
  Filled 2017-09-22: qty 2

## 2017-09-22 MED ORDER — SODIUM CHLORIDE 0.9 % IV SOLN
Freq: Once | INTRAVENOUS | Status: AC
  Administered 2017-09-22: 08:00:00 via INTRAVENOUS

## 2017-09-22 MED ORDER — DIAZEPAM 5 MG PO TABS: 5.0000 mg | ORAL_TABLET | Freq: Three times a day (TID) | ORAL | 0 refills | Status: DC | PRN

## 2017-09-22 MED ORDER — BUTALBITAL-APAP-CAFFEINE 50-325-40 MG PO TABS: 1.0000 | ORAL_TABLET | Freq: Four times a day (QID) | ORAL | 0 refills | Status: AC | PRN

## 2017-09-22 MED ORDER — IBUPROFEN 800 MG PO TABS: 800.0000 mg | ORAL_TABLET | Freq: Once | ORAL | Status: DC

## 2017-09-22 MED ORDER — DIAZEPAM 5 MG PO TABS: 5.0000 mg | ORAL_TABLET | Freq: Once | ORAL | Status: DC

## 2017-09-22 NOTE — ED Notes (Signed)
ED Provider at bedside. 

## 2017-09-22 NOTE — ED Triage Notes (Signed)
States left sided chest pressure for 2 days with a migraine, states no relief with OTC meds, states hx of migranes, CHF and a brain aneurysm

## 2017-09-22 NOTE — ED Notes (Signed)
Patient transported to X-ray 

## 2017-09-22 NOTE — ED Provider Notes (Signed)
[facility] Emergency Department Provider Note       Time seen: ----------------------------------------- 7:46 AM on 09/22/2017 -----------------------------------------   I have reviewed the triage vital signs and the nursing notes.  HISTORY   Chief Complaint Chest Pain    HPI Anna Tapia is a 26 y.o. female with a history of digestive heart failure who presents to the ED for headache and chest pain.  Patient reports left-sided chest pressure for 2 days with a "migraine headache".  Patient states she has not had any relief with over-the-counter medications.  She reports she has a history of CHF and a brain aneurysm.  Nothing makes her symptoms better or worse.  Pain is 8 out of 10 in the left chest and head  Past Medical History:  Diagnosis Date  . Brain aneurysm   . CHF (congestive heart failure) (HCC)     There are no active problems to display for this patient.   Past Surgical History:  Procedure Laterality Date  . VENTRICULOPERITONEAL SHUNT      Allergies Patient has no known allergies.  Social History Social History   Tobacco Use  . Smoking status: Current Every Day Smoker  . Smokeless tobacco: Never Used  Substance Use Topics  . Alcohol use: No  . Drug use: No    Review of Systems Constitutional: Negative for fever. Eyes: Negative for vision changes ENT:  Negative for congestion, sore throat Cardiovascular: Positive for chest pain Respiratory: Negative for shortness of breath. Gastrointestinal: Negative for abdominal pain, vomiting and diarrhea. Genitourinary: Negative for dysuria. Musculoskeletal: Negative for back pain. Skin: Negative for rash. Neurological: Positive for headache  All systems negative/normal/unremarkable except as stated in the HPI  ____________________________________________   PHYSICAL EXAM:  VITAL SIGNS: ED Triage Vitals  Enc Vitals Group     BP 09/22/17 0731 139/89     Pulse Rate  09/22/17 0731 77     Resp 09/22/17 0731 16     Temp 09/22/17 0731 97.7 F (36.5 C)     Temp Source 09/22/17 0731 Oral     SpO2 09/22/17 0731 100 %     Weight 09/22/17 0729 116 lb (52.6 kg)     Height 09/22/17 0729 5' (1.524 m)     Head Circumference --      Peak Flow --      Pain Score 09/22/17 0729 8     Pain Loc --      Pain Edu? --      Excl. in GC? --     Constitutional: Alert and oriented. Well appearing and in no distress. Eyes: Conjunctivae are normal. Normal extraocular movements. ENT   Head: Normocephalic and atraumatic.   Nose: No congestion/rhinnorhea.   Mouth/Throat: Mucous membranes are moist.   Neck: No stridor. Cardiovascular: Normal rate, regular rhythm. No murmurs, rubs, or gallops. Respiratory: Normal respiratory effort without tachypnea nor retractions. Breath sounds are clear and equal bilaterally. No wheezes/rales/rhonchi. Gastrointestinal: Soft and nontender. Normal bowel sounds Musculoskeletal: Nontender with normal range of motion in extremities. No lower extremity tenderness nor edema. Neurologic:  Normal speech and language. No gross focal neurologic deficits are appreciated.  Skin:  Skin is warm, dry and intact. No rash noted. Psychiatric: Mood and affect are normal. Speech and behavior are normal.  ____________________________________________  EKG: Interpreted by me.  Sinus rhythm rate of 77 bpm, normal PR interval, normal QRS, normal QT.  ____________________________________________  ED COURSE:  Pertinent labs & imaging results that were available during  my care of the patient were reviewed by me and considered in my medical decision making (see chart for details). Patient presents for chest pain and headache, we will assess with labs and imaging as indicated.   Procedures ____________________________________________   LABS (pertinent positives/negatives)  Labs Reviewed  BASIC METABOLIC PANEL  CBC  TROPONIN I  BRAIN  NATRIURETIC PEPTIDE  POC URINE PREG, ED  POCT PREGNANCY, URINE    RADIOLOGY  Chest x-ray, CT head, shunt series Were negative ____________________________________________  DIFFERENTIAL DIAGNOSIS   Tension headache, migraine, cluster headache, costochondritis, PE, MI, pneumothorax  FINAL ASSESSMENT AND PLAN  Chest pain, headache   Plan: Patient had presented for chest pain and headache. Patient's labs were reassuring. Patient's imaging was also reassuring.  Will prescribe medication for headache as well as muscle spasms.  She is stable for outpatient follow-up.   Emily FilbertWilliams, Taneshia Lorence E, MD   Note: This note was generated in part or whole with voice recognition software. Voice recognition is usually quite accurate but there are transcription errors that can and very often do occur. I apologize for any typographical errors that were not detected and corrected.     Emily FilbertWilliams, Keir Viernes E, MD 09/22/17 [phone]

## 2017-09-22 NOTE — ED Notes (Signed)
Pt signed paper copy for discharge due to tapaz malfnx

## 2017-09-22 NOTE — ED Notes (Signed)
Pt verbalizes d/c teaching and rx given. Pt in NAD at time of d/c, VS stable. PT denies further questions at this time, pt ambulatory with significant other to lobby.

## 2018-07-31 ENCOUNTER — Other Ambulatory Visit: Payer: Self-pay

## 2018-07-31 ENCOUNTER — Encounter: Payer: Self-pay | Admitting: Emergency Medicine

## 2018-07-31 ENCOUNTER — Emergency Department
Admission: EM | Admit: 2018-07-31 | Discharge: 2018-07-31 | Disposition: A | Discharge status: Home / Self Care | Payer: Medicaid Other | Attending: Student in an Organized Health Care Education/Training Program | Admitting: Student in an Organized Health Care Education/Training Program

## 2018-07-31 ENCOUNTER — Emergency Department: Payer: Medicaid Other

## 2018-07-31 DIAGNOSIS — R0789 Other chest pain: Secondary | ICD-10-CM | POA: Insufficient documentation

## 2018-07-31 DIAGNOSIS — I509 Heart failure, unspecified: Secondary | ICD-10-CM | POA: Insufficient documentation

## 2018-07-31 DIAGNOSIS — F439 Reaction to severe stress, unspecified: Secondary | ICD-10-CM | POA: Insufficient documentation

## 2018-07-31 DIAGNOSIS — J45909 Unspecified asthma, uncomplicated: Secondary | ICD-10-CM | POA: Insufficient documentation

## 2018-07-31 DIAGNOSIS — F17210 Nicotine dependence, cigarettes, uncomplicated: Secondary | ICD-10-CM | POA: Insufficient documentation

## 2018-07-31 HISTORY — DX: Unspecified asthma, uncomplicated: J45.909

## 2018-07-31 LAB — BASIC METABOLIC PANEL
ANION GAP: 9 (ref 5–15)
BUN: 6 mg/dL (ref 6–20)
CALCIUM: 9 mg/dL (ref 8.9–10.3)
CHLORIDE: 103 mmol/L (ref 98–111)
CO2: 25 mmol/L (ref 22–32)
Creatinine, Ser: 0.72 mg/dL (ref 0.44–1.00)
GFR calc Af Amer: >60 mL/min (ref >60)
GFR calc non Af Amer: >60 mL/min (ref >60)
GLUCOSE: 82 mg/dL (ref 70–99)
POTASSIUM: 3.6 mmol/L (ref 3.5–5.1)
Sodium: 137 mmol/L (ref 135–145)

## 2018-07-31 LAB — CBC
HEMATOCRIT: 39.5 % (ref 35.0–47.0)
Hemoglobin: 13.3 g/dL (ref 12.0–16.0)
MCH: 30.5 pg (ref 26.0–34.0)
MCHC: 33.7 g/dL (ref 32.0–36.0)
MCV: 90.5 fL (ref 80.0–100.0)
Platelets: 252 10*3/uL (ref 150–440)
RBC: 4.36 MIL/uL (ref 3.80–5.20)
RDW: 13 % (ref 11.5–14.5)
WBC: 4.8 10*3/uL (ref 3.6–11.0)

## 2018-07-31 LAB — HCG, QUANTITATIVE, PREGNANCY: hCG, Beta Chain, Quant, S: <1 m[IU]/mL (ref <5)

## 2018-07-31 LAB — TROPONIN I

## 2018-07-31 MED ORDER — LORAZEPAM 1 MG PO TABS
1.0000 mg | ORAL_TABLET | Freq: Once | ORAL | Status: AC
  Administered 2018-07-31: 1 mg via ORAL
  Filled 2018-07-31: qty 1

## 2018-07-31 MED ORDER — KETOROLAC TROMETHAMINE 30 MG/ML IJ SOLN
15.0000 mg | Freq: Once | INTRAMUSCULAR | Status: AC
  Administered 2018-07-31: 15 mg via INTRAMUSCULAR
  Filled 2018-07-31: qty 1

## 2018-07-31 MED ORDER — LORAZEPAM 1 MG PO TABS: 1.0000 mg | ORAL_TABLET | Freq: Three times a day (TID) | ORAL | 0 refills | Status: DC | PRN

## 2018-07-31 NOTE — ED Notes (Signed)
Unavailable wow scanner, attempted two which didn't work, witness Comptroller, Charity fundraiser

## 2018-07-31 NOTE — ED Provider Notes (Signed)
[facility] Emergency Department Provider Note    First MD Initiated Contact with Patient 07/31/18 1324     (approximate)  I have reviewed the triage vital signs and the nursing notes.   HISTORY  Chief Complaint Chest Pain and Headache    HPI Anna Tapia is a 27 y.o. female presents the ER chief complaint of midsternal nonradiating chest pain worsened with movement that started this morning associated with mild headache.  Has had similar headaches in the past.  Her primary concern is pain.  States that she is not concerned about the headache.  Does have a history of possible brain aneurysm followed by [facility].  Also has a history of self-reported congestive heart failure.  Denies any orthopnea.  No leg swelling.  Is not on any birth control.  Does smoke 1 to 2 cigarettes a day.  No productive cough or pleuritic pain.    Past Medical History:  Diagnosis Date  . Asthma   . Brain aneurysm   . CHF (congestive heart failure) (HCC)    History reviewed. No pertinent family history. Past Surgical History:  Procedure Laterality Date  . VENTRICULOPERITONEAL SHUNT     There are no active problems to display for this patient.     Prior to Admission medications   Medication Sig Start Date End Date Taking? Authorizing Provider  butalbital-acetaminophen-caffeine (FIORICET, ESGIC) 50-325-40 MG tablet Take 1-2 tablets every 6 (six) hours as needed by mouth for headache. 09/22/17 09/22/18  Emily Filbert, MD  cyclobenzaprine (FLEXERIL) 10 MG tablet Take 1 tablet (10 mg total) by mouth 3 (three) times daily as needed for muscle spasms. Patient not taking: Reported on 09/22/2017 08/16/16   Hagler, Jami L, PA-C  diazepam (VALIUM) 5 MG tablet Take 1 tablet (5 mg total) every 8 (eight) hours as needed by mouth for muscle spasms. 09/22/17   Emily Filbert, MD  ketorolac (TORADOL) 10 MG tablet Take 1 tablet (10 mg total) by mouth every 8 (eight) hours as  needed. Patient not taking: Reported on 09/22/2017 04/17/17   Darci Current, MD  LORazepam (ATIVAN) 1 MG tablet Take 1 tablet (1 mg total) by mouth every 8 (eight) hours as needed for anxiety. 07/31/18 07/31/19  Willy Eddy, MD  metoCLOPramide (REGLAN) 10 MG tablet Take 1 tablet (10 mg total) by mouth every 8 (eight) hours as needed for nausea. Patient not taking: Reported on 09/22/2017 10/25/16 11/04/16  Jennye Moccasin, MD  naproxen (NAPROSYN) 500 MG tablet Take 1 tablet (500 mg total) by mouth 2 (two) times daily with a meal. Patient not taking: Reported on 09/22/2017 08/16/16   Hagler, Jami L, PA-C  traMADol (ULTRAM) 50 MG tablet Take 1 tablet (50 mg total) by mouth every 6 (six) hours as needed. Patient not taking: Reported on 09/22/2017 10/25/16   Jennye Moccasin, MD    Allergies Patient has no known allergies.    Social History Social History   Tobacco Use  . Smoking status: Current Every Day Smoker    Types: Cigarettes  . Smokeless tobacco: Never Used  Substance Use Topics  . Alcohol use: Yes    Comment: Rare  . Drug use: No    Review of Systems Patient denies headaches, rhinorrhea, blurry vision, numbness, shortness of breath, chest pain, edema, cough, abdominal pain, nausea, vomiting, diarrhea, dysuria, fevers, rashes or hallucinations unless otherwise stated above in HPI. ____________________________________________   PHYSICAL EXAM:  VITAL SIGNS: Vitals:   07/31/18 1129  BP: (!) 142/79  Pulse: 69  Resp: 18  Temp: 98.3 F (36.8 C)  SpO2: 100%    Constitutional: Alert and oriented.  Eyes: Conjunctivae are normal.  Head: Atraumatic. Nose: No congestion/rhinnorhea. Mouth/Throat: Mucous membranes are moist.   Neck: No stridor. Painless ROM.  Cardiovascular: Normal rate, regular rhythm. Grossly normal heart sounds.  Good peripheral circulation. Respiratory: Normal respiratory effort.  No retractions. Lungs CTAB. Gastrointestinal: Soft and nontender.  No distention. No abdominal bruits. No CVA tenderness. Genitourinary:  Musculoskeletal: No lower extremity tenderness nor edema.  No joint effusions. Neurologic:  Normal speech and language. No gross focal neurologic deficits are appreciated. No facial droop Skin:  Skin is warm, dry and intact. No rash noted. Psychiatric: Mood and affect are normal. Speech and behavior are normal.  ____________________________________________   LABS (all labs ordered are listed, but only abnormal results are displayed)  Results for orders placed or performed during the hospital encounter of 07/31/18 (from the past 24 hour(s))  Basic metabolic panel     Status: None   Collection Time: 07/31/18 11:28 AM  Result Value Ref Range   Sodium 137 135 - 145 mmol/L   Potassium 3.6 3.5 - 5.1 mmol/L   Chloride 103 98 - 111 mmol/L   CO2 25 22 - 32 mmol/L   Glucose, Bld 82 70 - 99 mg/dL   BUN 6 6 - 20 mg/dL   Creatinine, Ser 1.61 0.44 - 1.00 mg/dL   Calcium 9.0 8.9 - 09.6 mg/dL   GFR calc non Af Amer >60 >60 mL/min   GFR calc Af Amer >60 >60 mL/min   Anion gap 9 5 - 15  CBC     Status: None   Collection Time: 07/31/18 11:28 AM  Result Value Ref Range   WBC 4.8 3.6 - 11.0 K/uL   RBC 4.36 3.80 - 5.20 MIL/uL   Hemoglobin 13.3 12.0 - 16.0 g/dL   HCT 04.5 40.9 - 81.1 %   MCV 90.5 80.0 - 100.0 fL   MCH 30.5 26.0 - 34.0 pg   MCHC 33.7 32.0 - 36.0 g/dL   RDW 91.4 78.2 - 95.6 %   Platelets 252 150 - 440 K/uL  Troponin I     Status: None   Collection Time: 07/31/18 11:28 AM  Result Value Ref Range   Troponin I <0.03 <0.03 ng/mL  hCG, quantitative, pregnancy     Status: None   Collection Time: 07/31/18  1:00 PM  Result Value Ref Range   hCG, Beta Chain, Quant, S <1 <5 mIU/mL   ____________________________________________  EKG My review and personal interpretation at Time: 11:26   Indication: chest pain  Rate: 65  Rhythm: sinus Axis: normal Other: normal intervals, no  stemi ____________________________________________  RADIOLOGY  I personally reviewed all radiographic images ordered to evaluate for the above acute complaints and reviewed radiology reports and findings.  These findings were personally discussed with the patient.  Please see medical record for radiology report.  ____________________________________________   PROCEDURES  Procedure(s) performed:  Procedures    Critical Care performed: no ____________________________________________   INITIAL IMPRESSION / ASSESSMENT AND PLAN / ED COURSE  Pertinent labs & imaging results that were available during my care of the patient were reviewed by me and considered in my medical decision making (see chart for details).   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 who presents to the ED with symptoms as described above.  Patient low risk by heart score.  Low risk by Wells criteria and is PERC negative.  EKG shows nonspecific changes.  Does have some slight PR depression a few leads but no diffuse ST elevation or diffuse PR depression.  Would be atypical for pericarditis based on symptoms.  Do not appreciate any friction rub.  Chest x-ray is reassuring.  Patient admits to feeling very stressed at work and at home.  Certainly may have a component of stress-induced symptomatology.  Trial Ativan as well as Toradol for muscular skeletal pain and reassess.  Clinical Course as of Jul 31 1434  Fri Jul 31, 2018  1429 Patient rechecked.  Pain improving.  Do suspect some component of stress induced symptomatology.  Patient feels very overwhelmed at work and at home.  No SI or HI.  Headache is resolved.  This point do believe she stable and appropriate for close outpatient follow-up.  Have discussed with the patient and available family all diagnostics and treatments performed thus far and all questions were answered to  the best of my ability. The patient demonstrates understanding and agreement with plan.    [PR]    Clinical Course User Index [PR] Willy Eddy, MD     As part of my medical decision making, I reviewed the following data within the electronic MEDICAL RECORD NUMBER Nursing notes reviewed and incorporated, Labs reviewed, notes from prior ED visits and [place] Controlled Substance Database   ____________________________________________   FINAL CLINICAL IMPRESSION(S) / ED DIAGNOSES  Final diagnoses:  Atypical chest pain  Stress      NEW MEDICATIONS STARTED DURING THIS VISIT:  New Prescriptions   LORAZEPAM (ATIVAN) 1 MG TABLET    Take 1 tablet (1 mg total) by mouth every 8 (eight) hours as needed for anxiety.     Note:  This document was prepared using Dragon voice recognition software and may include unintentional dictation errors.    Willy Eddy, MD 07/31/18 1435

## 2018-07-31 NOTE — ED Triage Notes (Signed)
Pt presents to ED with c/o sudden onset CP and HA that started this AM at approx 0400-0500. Pt endorses pain worsens when she bends over and she feels nauseated, denies any radiation of the pain in her chest. Pt denies photosensitivity or sound sensitivity. Pt A&O x4, NAD noted, ambulatory without difficulty at this time.

## 2018-11-04 DIAGNOSIS — Z8781 Personal history of (healed) traumatic fracture: Secondary | ICD-10-CM

## 2018-11-04 HISTORY — DX: Personal history of (healed) traumatic fracture: Z87.81

## 2018-11-23 IMAGING — CR DG CERVICAL SPINE 1V
1 series · 1 of 1 positions shown · non-contrast
Comparison: Head CT without contrast 04/18/2015 and earlier. Chest
radiographs 04/17/2017

CLINICAL DATA: 25-year-old female with history of prior
intracranial embolization and "Brain aneurysm" . Headache, left side
chest pressure for 2 days. "Has had shunt since a baby. "Query shunt
malfunction.

EXAM:
SKULL - 1-3 VIEW; CHEST 1 VIEW; ABDOMEN - 1 VIEW; DG CERVICAL SPINE
- 1 VIEW

[c-spine lat]
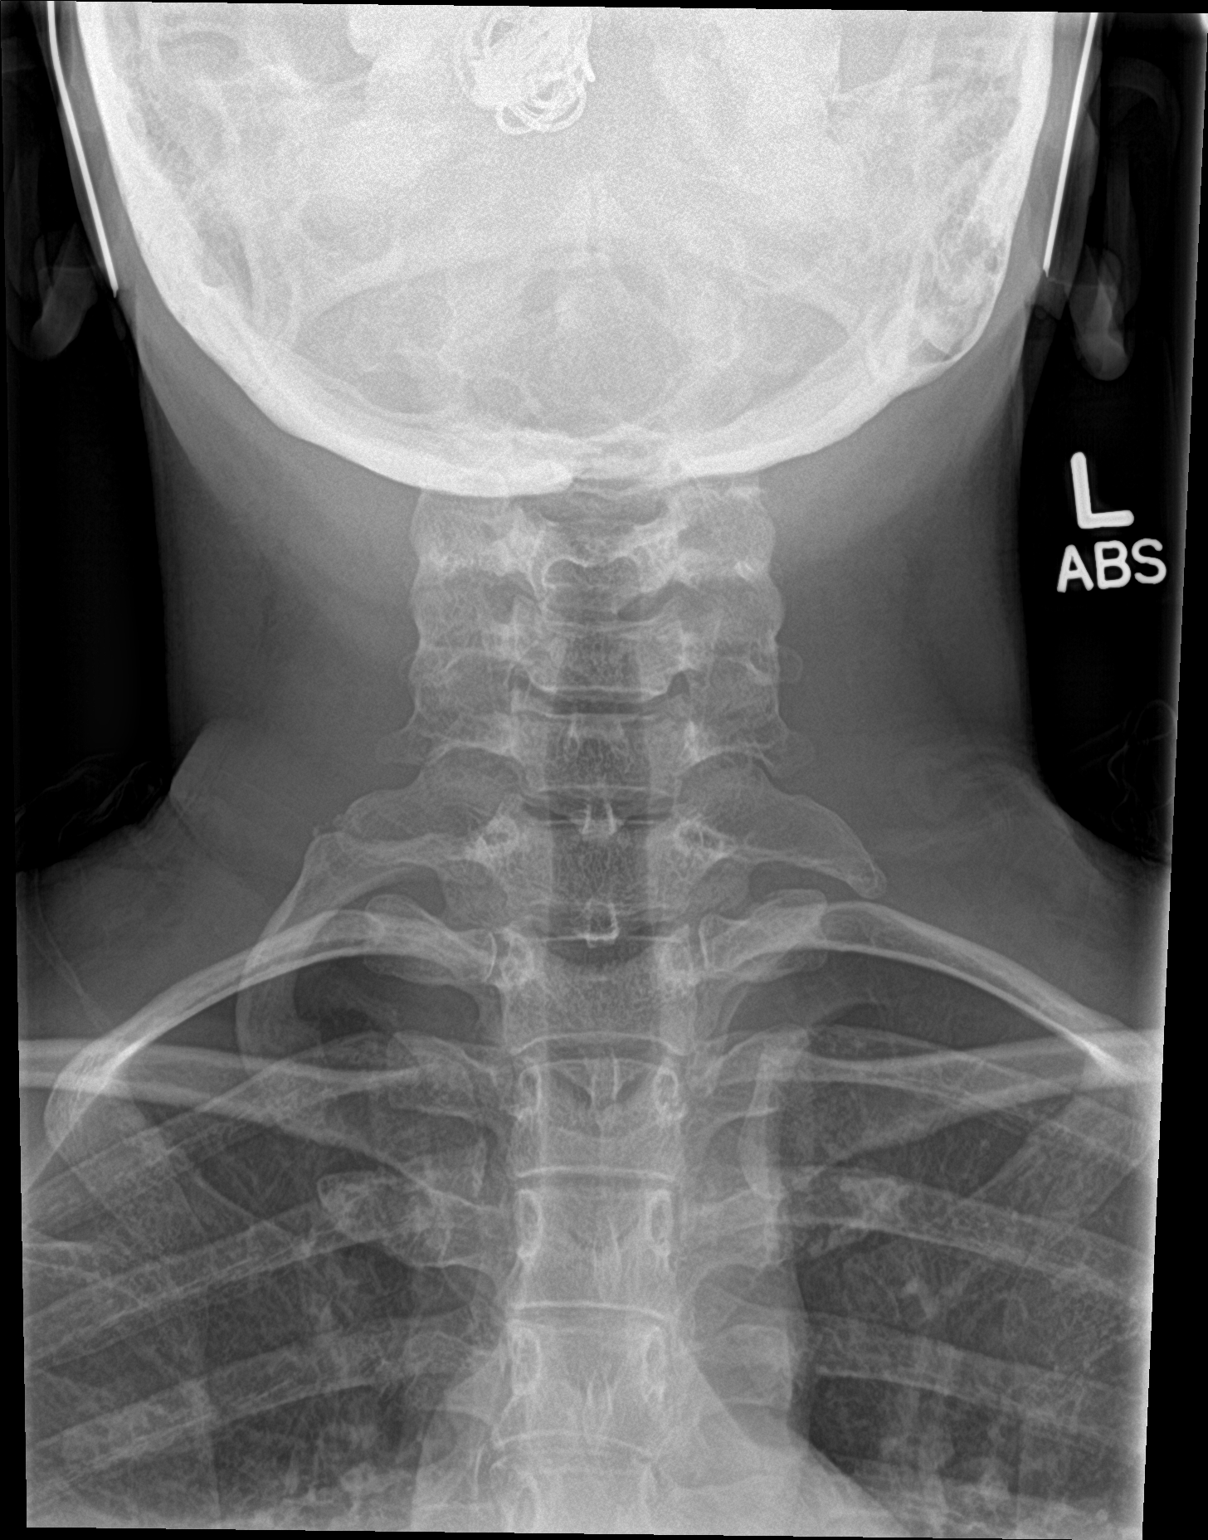

[1 of 1 positions shown; findings below may reference images not displayed]

FINDINGS: Sequelae of intracranial vascular embolization with both bulky 2-3
cm area of vascular coils and surrounding radiopaque glue like
material appears stable compared to the 8253 comparison. There is no
superimposed ventricular drain or CSF shunt.

Prior left occipital small craniectomy near the midline better
demonstrated on the 8253 CT. Bone mineralization is within normal
limits. The skull appears intact. Paranasal sinuses and mastoids
appear symmetrically pneumatized.

Right side C7 cervical rib incidentally noted. Slight dextroconvex
thoracic scoliosis and more moderate levoconvex lumbar scoliosis. No
acute osseous abnormality identified.

Mild cardiomegaly. Other mediastinal contours are within normal
limits. The lungs are clear. No pneumothorax or pneumoperitoneum.
Non obstructed bowel gas pattern. Negative visualized abdominal and
pelvic visceral contours. No catheter material identified in the
neck, chest, abdomen, or pelvis.
IMPRESSION: 1. There is no CSF shunt or catheter tubing.
2. Stable radiographic appearance of prior intracranial embolization
with both coil pack and glue.
3. Chronic cardiomegaly with suspected left atrial enlargement.
4. Previous small left cerebellar craniectomy to the left of
midline. No acute osseous abnormality identified.

## 2018-11-23 IMAGING — CT CT HEAD W/O CM
3 series · 16 of 47 positions shown, 19 images · non-contrast
Comparison: CT scan of April 18, 2015.

CLINICAL DATA: Migraine headache.

EXAM:
CT HEAD WITHOUT CONTRAST
TECHNIQUE: Contiguous axial images were obtained from the base of the skull
through the vertex without intravenous contrast.

[Series 2: head wo · axial · 0.43mm/px · z∈[-77,+58]mm · 10 of 33 slices shown, 13 images]
[im 3/33  brain]
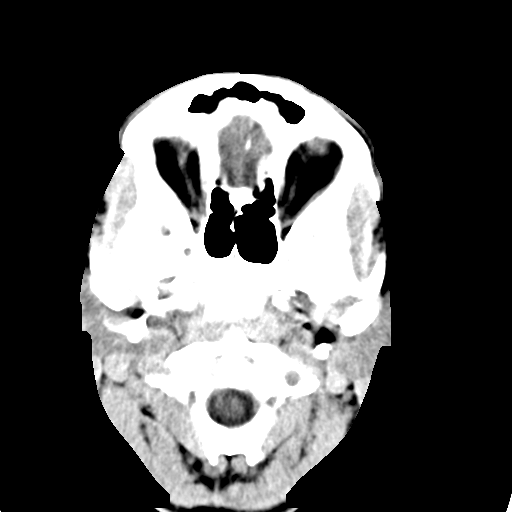
[im 3/33  bone]
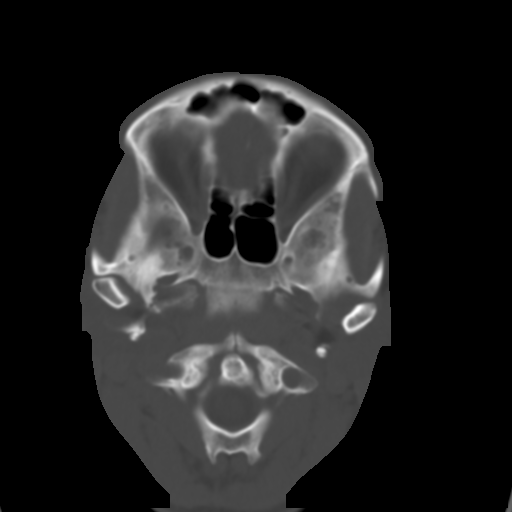
[im 6/33  brain]
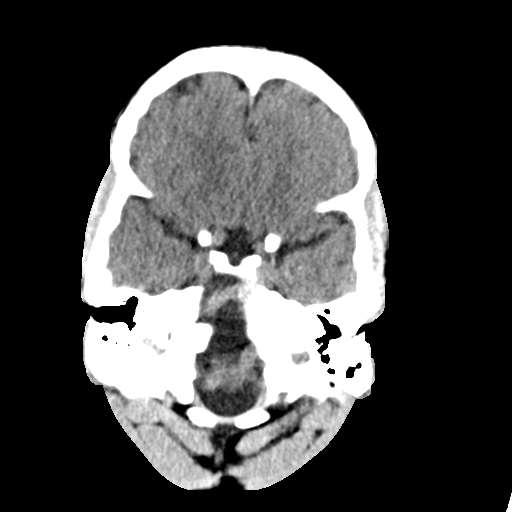
[im 9/33  brain]
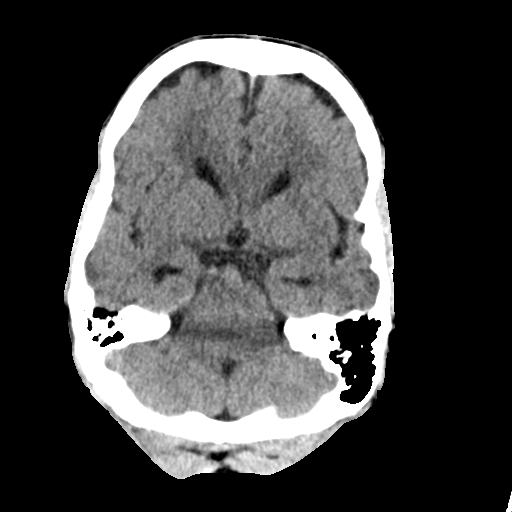
[im 12/33  brain]
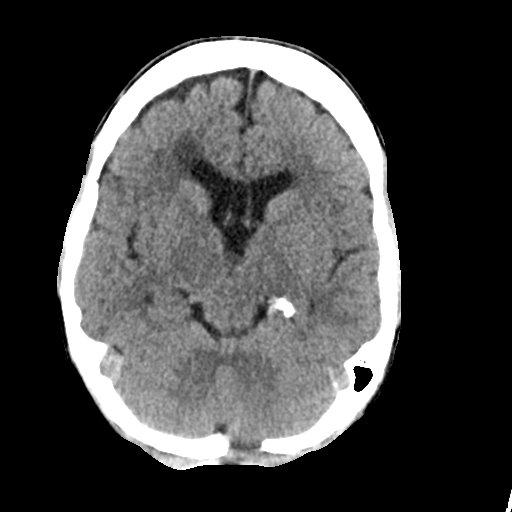
[im 15/33  brain]
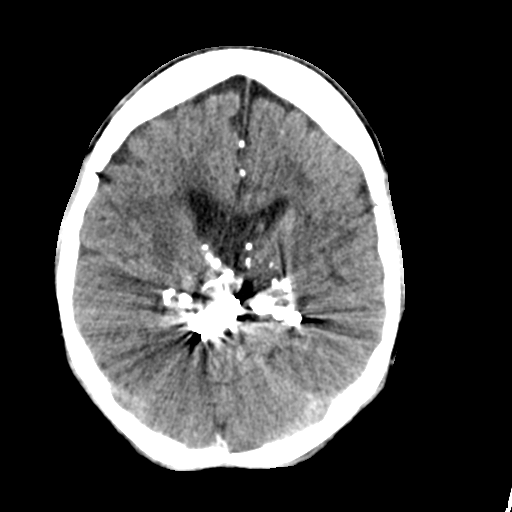
[im 15/33  bone]
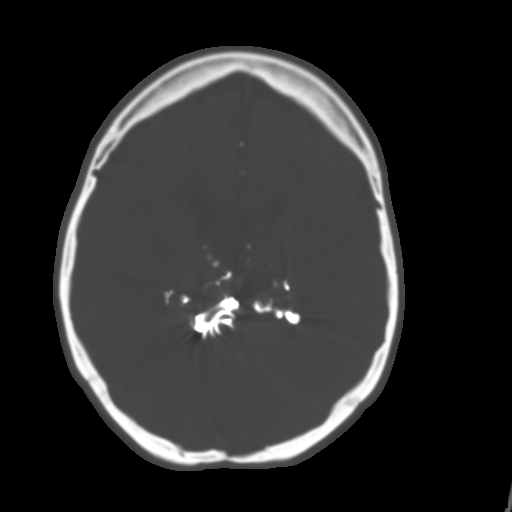
[im 18/33  brain]
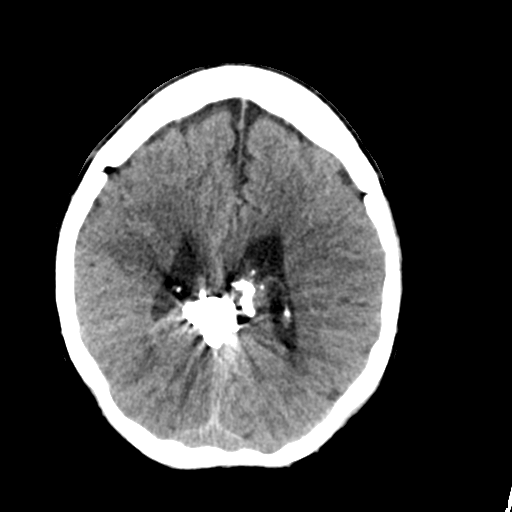
[im 21/33  brain]
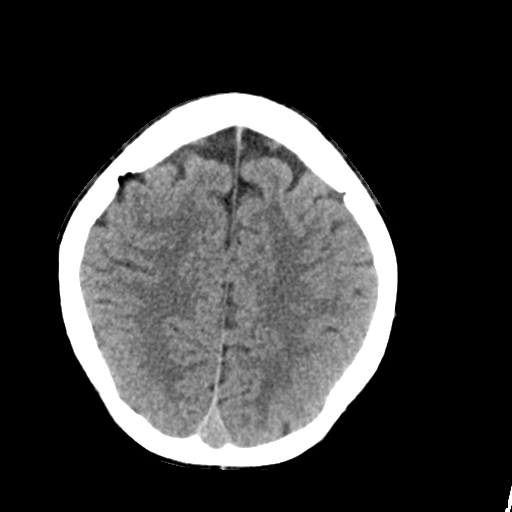
[im 25/33  brain]
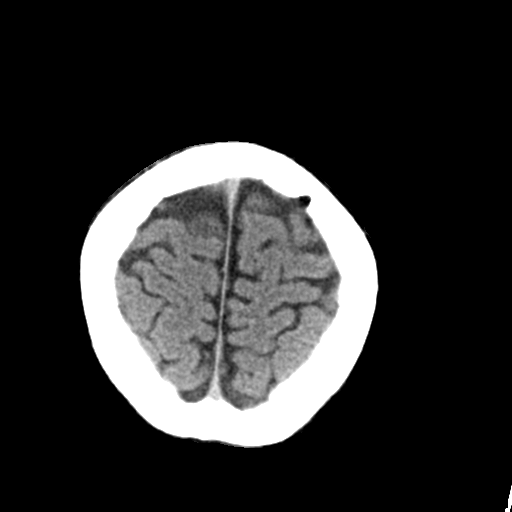
[im 27/33  brain]
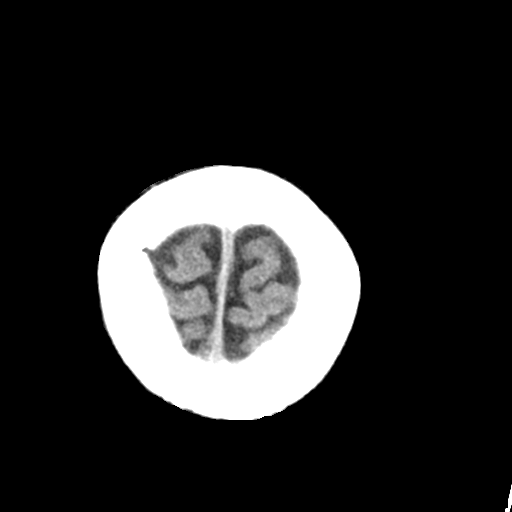
[im 27/33  bone]
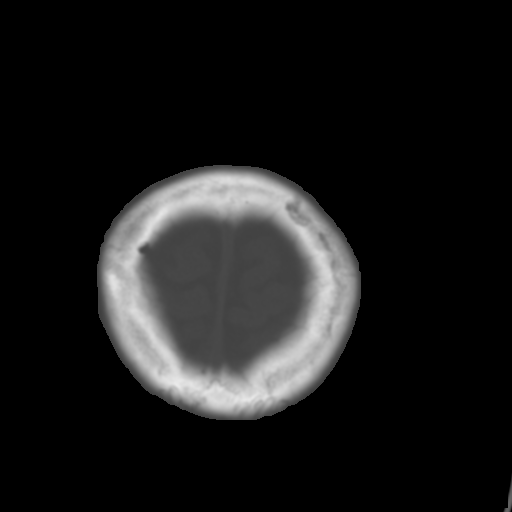
[im 30/33  brain]
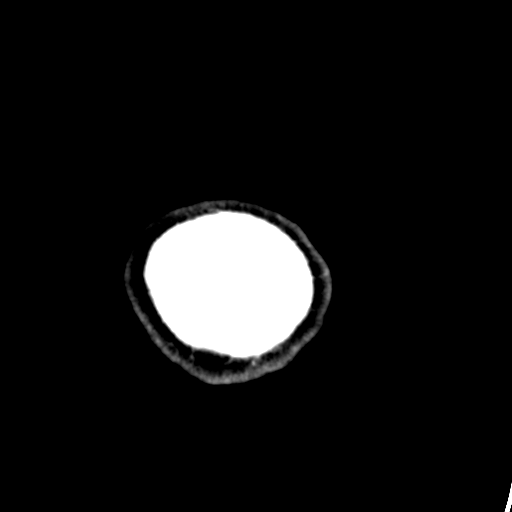

[Series 4: coronal soft tissue · coronal · 0.35mm/px · 3 of 72 slices shown]
[im 24/72  brain]
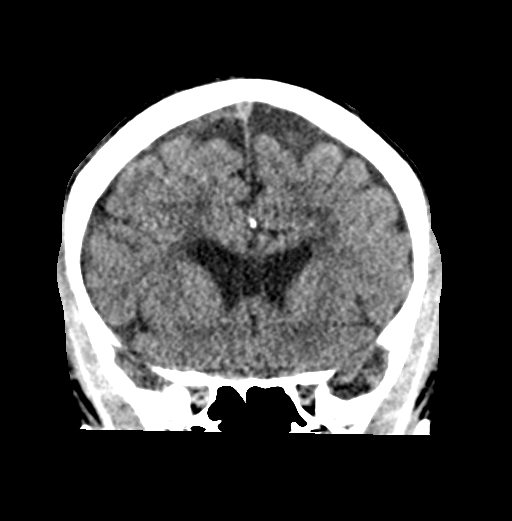
[im 32/72  brain]
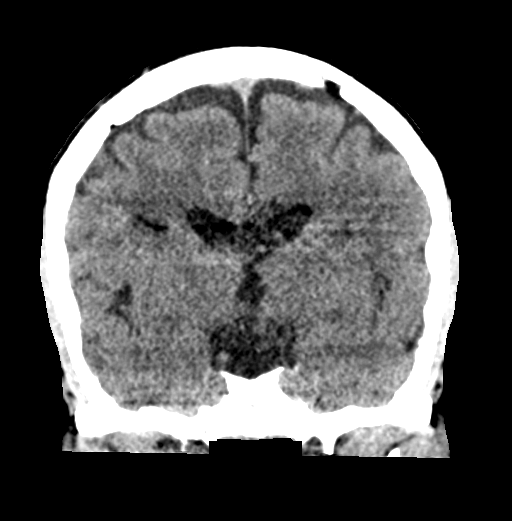
[im 40/72  brain]
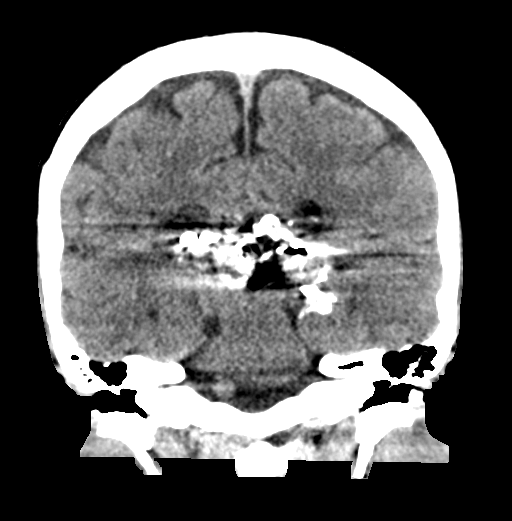

[Series 5: sagittal soft tissue · sagittal · 0.37mm/px · 3 of 55 slices shown]
[im 19/55  brain]
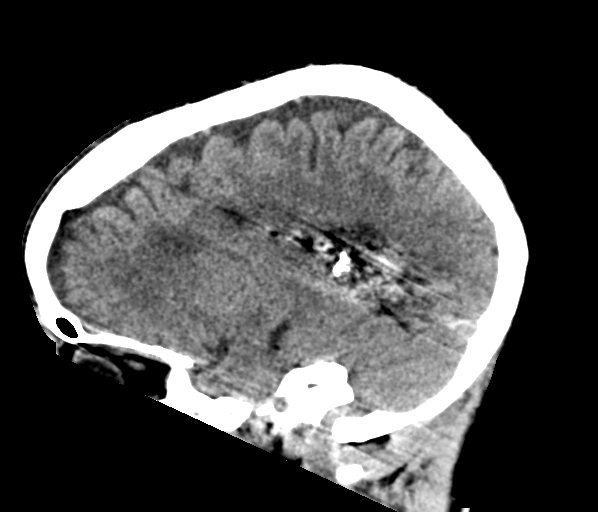
[im 28/55  brain]
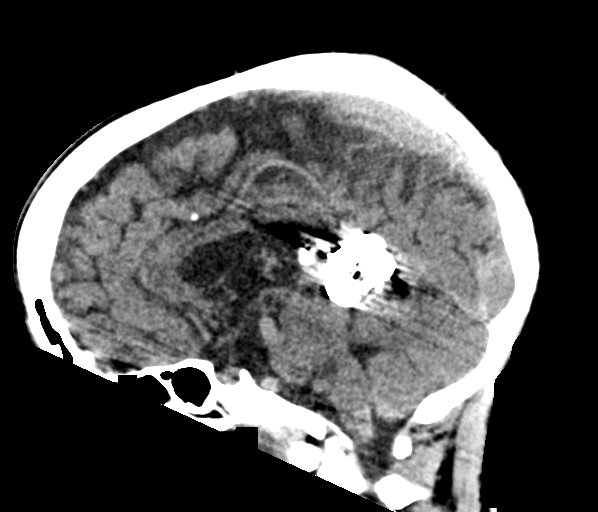
[im 37/55  brain]
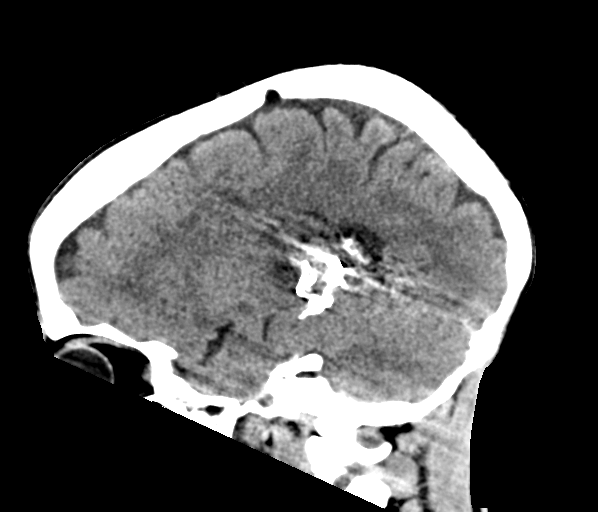

[16 of 47 positions shown; findings below may reference images not displayed]

FINDINGS: Brain: There is again noted multiple embolization coils seen
centrally in the region of the septum vergae consistent with prior
endovascular treatment of AVM. This is unchanged compared to prior
exam. No mass effect or midline shift is noted. Ventricular size is
within normal limits and unchanged compared to prior exam. No acute
hemorrhage or acute infarction is noted.

Vascular: Stable dolichoectasia the basilar artery is noted. No new
findings are noted.

Skull: Stable left occipital craniotomy. No acute abnormality is
noted.

Sinuses/Orbits: No acute finding.

Other: None.
IMPRESSION: Stable postsurgical and other changes are noted consistent with
prior treatment of probable AVM. No significant changes noted
compared to prior exam. No acute abnormality is noted.

## 2019-06-11 ENCOUNTER — Encounter: Payer: Self-pay | Admitting: Emergency Medicine

## 2019-06-11 ENCOUNTER — Emergency Department: Payer: Self-pay

## 2019-06-11 ENCOUNTER — Emergency Department
Admission: EM | Admit: 2019-06-11 | Discharge: 2019-06-11 | Disposition: A | Discharge status: Home / Self Care | Payer: Self-pay | Attending: Emergency Medicine | Admitting: Emergency Medicine

## 2019-06-11 ENCOUNTER — Other Ambulatory Visit: Payer: Self-pay

## 2019-06-11 DIAGNOSIS — F17210 Nicotine dependence, cigarettes, uncomplicated: Secondary | ICD-10-CM | POA: Insufficient documentation

## 2019-06-11 DIAGNOSIS — J45909 Unspecified asthma, uncomplicated: Secondary | ICD-10-CM | POA: Insufficient documentation

## 2019-06-11 DIAGNOSIS — I509 Heart failure, unspecified: Secondary | ICD-10-CM | POA: Insufficient documentation

## 2019-06-11 DIAGNOSIS — G43009 Migraine without aura, not intractable, without status migrainosus: Secondary | ICD-10-CM | POA: Insufficient documentation

## 2019-06-11 MED ORDER — PROCHLORPERAZINE EDISYLATE 10 MG/2ML IJ SOLN
10.0000 mg | Freq: Once | INTRAMUSCULAR | Status: AC
  Administered 2019-06-11: 10 mg via INTRAVENOUS
  Filled 2019-06-11: qty 2

## 2019-06-11 MED ORDER — SODIUM CHLORIDE 0.9 % IV BOLUS
1000.0000 mL | Freq: Once | INTRAVENOUS | Status: AC
  Administered 2019-06-11: 1000 mL via INTRAVENOUS

## 2019-06-11 MED ORDER — DEXAMETHASONE SODIUM PHOSPHATE 10 MG/ML IJ SOLN
10.0000 mg | Freq: Once | INTRAMUSCULAR | Status: AC
  Administered 2019-06-11: 10 mg via INTRAVENOUS
  Filled 2019-06-11: qty 1

## 2019-06-11 MED ORDER — DIPHENHYDRAMINE HCL 50 MG/ML IJ SOLN
25.0000 mg | Freq: Once | INTRAMUSCULAR | Status: AC
  Administered 2019-06-11: 11:00:00 25 mg via INTRAVENOUS
  Filled 2019-06-11: qty 1

## 2019-06-11 MED ORDER — BUTALBITAL-APAP-CAFFEINE 50-325-40 MG PO TABS: 1.0000 | ORAL_TABLET | Freq: Four times a day (QID) | ORAL | 0 refills | Status: DC | PRN

## 2019-06-11 NOTE — ED Notes (Signed)
Esign not working at this time. Pt verbalized discharge instructions and has no questions at this time. 

## 2019-06-11 NOTE — ED Notes (Signed)
CT called and informed pt not able to provide urine specimen at this time

## 2019-06-11 NOTE — ED Provider Notes (Addendum)
[facility] Emergency Department Provider Note  ____________________________________________   First MD Initiated Contact with Patient 06/11/19 1028     (approximate)  I have reviewed the triage vital signs and the nursing notes.   HISTORY  Chief Complaint Headache    HPI Anna Tapia is a 28 y.o. female with remote history of CHF, brain aneurysm, here with headache.   Patient states that ever since her surgery as a small child, she has had near at least weekly headaches.  She has recurrent migraines.  She is unable to follow-up with a neurologist due to lack of insurance.  She reports that over the last 2 days, she has had progressively worsening, aching, throbbing, generalized headache.  This headache is similar to her usual migraines.  Denies any recent trauma.  Denies any focal weakness or numbness.  No vision changes.  She takes over-the-counter medications without relief.  No other acute complaints.  No recent fevers or chills.  No neck pain or neck stiffness.       Past Medical History:  Diagnosis Date   Asthma    Brain aneurysm    CHF (congestive heart failure) (HCC)     There are no active problems to display for this patient.   Past Surgical History:  Procedure Laterality Date   VENTRICULOPERITONEAL SHUNT      Prior to Admission medications   Medication Sig Start Date End Date Taking? Authorizing Provider  acetaminophen (TYLENOL) 325 MG tablet Take 650 mg by mouth every 6 (six) hours as needed.   Yes [provider]  butalbital-acetaminophen-caffeine (FIORICET) 50-325-40 MG tablet Take 1-2 tablets by mouth every 6 (six) hours as needed for headache. 06/11/19 06/10/20  Shaune PollackIsaacs, Arneta Mahmood, MD    Allergies Patient has no known allergies.  No family history on file.  Social History Social History   Tobacco Use   Smoking status: Current Every Day Smoker    Types: Cigarettes   Smokeless tobacco: Never Used  Substance  Use Topics   Alcohol use: Yes    Comment: Rare   Drug use: No    Review of Systems  Review of Systems  Constitutional: Positive for fatigue. Negative for fever.  HENT: Negative for congestion and sore throat.   Eyes: Negative for visual disturbance.  Respiratory: Negative for cough and shortness of breath.   Cardiovascular: Negative for chest pain.  Gastrointestinal: Negative for abdominal pain, diarrhea, nausea and vomiting.  Genitourinary: Negative for flank pain.  Musculoskeletal: Negative for back pain and neck pain.  Skin: Negative for rash and wound.  Neurological: Positive for headaches. Negative for weakness.  All other systems reviewed and are negative.    ____________________________________________  PHYSICAL EXAM:      VITAL SIGNS: ED Triage Vitals  Enc Vitals Group     BP 06/11/19 0953 128/86     Pulse Rate 06/11/19 0953 73     Resp 06/11/19 0953 18     Temp 06/11/19 0953 99.7 F (37.6 C)     Temp Source 06/11/19 0953 Oral     SpO2 06/11/19 0953 100 %     Weight 06/11/19 1124 119 lb 7.8 oz (54.2 kg)     Height 06/11/19 1124 5\' 3"  (1.6 m)     Head Circumference --      Peak Flow --      Pain Score 06/11/19 1001 7     Pain Loc --      Pain Edu? --  Excl. in GC? --      Physical Exam Vitals signs and nursing note reviewed.  Constitutional:      General: She is not in acute distress.    Appearance: She is well-developed.  HENT:     Head: Normocephalic and atraumatic.  Eyes:     Conjunctiva/sclera: Conjunctivae normal.  Neck:     Musculoskeletal: Neck supple.  Cardiovascular:     Rate and Rhythm: Normal rate and regular rhythm.     Heart sounds: Normal heart sounds. No murmur. No friction rub.  Pulmonary:     Effort: Pulmonary effort is normal. No respiratory distress.     Breath sounds: Normal breath sounds. No wheezing or rales.  Abdominal:     General: There is no distension.     Palpations: Abdomen is soft.     Tenderness: There is no  abdominal tenderness.  Skin:    General: Skin is warm.     Capillary Refill: Capillary refill takes less than 2 seconds.  Neurological:     Mental Status: She is alert and oriented to person, place, and time.     GCS: GCS eye subscore is 4. GCS verbal subscore is 5. GCS motor subscore is 6.     Cranial Nerves: Cranial nerves are intact.     Sensory: Sensation is intact.     Motor: Motor function is intact. No abnormal muscle tone.     Coordination: Coordination is intact.       ____________________________________________   LABS (all labs ordered are listed, but only abnormal results are displayed)  Labs Reviewed  POC URINE PREG, ED    ____________________________________________  EKG: NSR, no acute changes ________________________________________  RADIOLOGY All imaging, including plain films, CT scans, and ultrasounds, independently reviewed by me, and interpretations confirmed via formal radiology reads.  ED MD interpretation:   CT Head; Negative  Official radiology report(s): Ct Head Wo Contrast  Result Date: 06/11/2019 CLINICAL DATA:  Headache for 2 days. Previous arteriovenous malformation with coiling. EXAM: CT HEAD WITHOUT CONTRAST TECHNIQUE: Contiguous axial images were obtained from the base of the skull through the vertex without intravenous contrast. COMPARISON:  September 22, 2017 and April 18, 2015 FINDINGS: Brain: The ventricles are stable in appearance, upper normal in size. There is a cavum septum pellucidum and cavum vergae. There is extensive increased attenuation material in the cavum vergae region consistent with multiple coils and apparent glue for arteriovenous malformation embolization. This area appears stable compared to prior studies. Note that there is susceptibility artifact from the embolization material, essentially stable. No demonstrable mass, hemorrhage, extra-axial fluid collection, or midline shift. No acute infarct evident. Areas of relative  decreased attenuation in portions of the periventricular white matter are stable and may in part be due to artifact. 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 No hyperdense vessel. No appreciable vascular calcification. Skull: Previous left occipital craniotomy defect is stable. Bony calvarium otherwise appears intact. Non fusion of the anterior arch of C1 is an anatomic variant. Sinuses/Orbits: Visualized paranasal sinuses are clear. Visualized orbits appear symmetric bilaterally. Other: Mastoid air cells are clear. IMPRESSION: 1. Previous coil and glue embolization for apparent arteriovenous malformation in the region of the cavum vergae, stable. Artifact from this increased attenuation material is extensive and stable. 2. Ventricles upper normal in size, stable. There is a patent cavum septum pellucidum, an anatomic variant. 3.  No acute infarct evident.  No mass or hemorrhage. 4.  Postoperative left occipital craniotomy defect.  wwwwww Electronically Signed   By: Lowella Grip III M.D.   On: 06/11/2019 12:04    ____________________________________________  PROCEDURES   Procedure(s) performed (including Critical Care):  Procedures  ____________________________________________  INITIAL IMPRESSION / MDM / [place] / ED COURSE  As part of my medical decision making, I reviewed the following data within the electronic MEDICAL RECORD NUMBER Notes from prior ED visits and [place] Controlled Substance Database      *Anna Tapia was evaluated in Emergency Department on 06/11/2019 for the symptoms described in the history of present illness. She was evaluated in the context of the global COVID-19 pandemic, which necessitated consideration that the patient might be at risk for infection with the SARS-CoV-2 virus that causes COVID-19. Institutional protocols and algorithms that pertain to the evaluation of patients at risk for COVID-19 are in a state of rapid change  based on information released by regulatory bodies including the CDC and federal and state organizations. These policies and algorithms were followed during the patient's care in the ED.  Some ED evaluations and interventions may be delayed as a result of limited staffing during the pandemic.*      Medical Decision Making: 28 yo F here with acute on chronic HA. H/o chronic migraines. No fever, neck pain/stiffness or signs of meningitis or encephalitis. HA began gradually and do not suspect this is SAH. No focal neuro deficits. CT head neg. Will start on brief course of migraine meds, d/c with outpt follow-up.  ____________________________________________  FINAL CLINICAL IMPRESSION(S) / ED DIAGNOSES  Final diagnoses:  Migraine without aura and without status migrainosus, not intractable     MEDICATIONS GIVEN DURING THIS VISIT:  Medications  sodium chloride 0.9 % bolus 1,000 mL (0 mLs Intravenous Stopped 06/11/19 1319)  dexamethasone (DECADRON) injection 10 mg (10 mg Intravenous Given 06/11/19 1120)  prochlorperazine (COMPAZINE) injection 10 mg (10 mg Intravenous Given 06/11/19 1119)  diphenhydrAMINE (BENADRYL) injection 25 mg (25 mg Intravenous Given 06/11/19 1120)     ED Discharge Orders         Ordered    butalbital-acetaminophen-caffeine (FIORICET) 50-325-40 MG tablet  Every 6 hours PRN     06/11/19 1323           Note:  This document was prepared using Dragon voice recognition software and may include unintentional dictation errors.   Duffy Bruce, MD 06/11/19 1605    Duffy Bruce, MD 06/28/19 Curly [place]

## 2019-06-11 NOTE — ED Notes (Addendum)
Pt A&Ox4 and playing on phone when nurse entered the room. Pt neurologically intact. NAD noted. Pt states hx of migraines, states pain with nausea x2 days. States has tried 500 mg of tylenol without relief. Will continue to monitor.

## 2019-06-11 NOTE — ED Triage Notes (Signed)
PT c/o headache x2days. Hx of migraines, takes OTC meds. Speech clear, NAD noted, VSS

## 2019-06-13 ENCOUNTER — Other Ambulatory Visit: Payer: Self-pay

## 2019-06-13 DIAGNOSIS — Y999 Unspecified external cause status: Secondary | ICD-10-CM | POA: Insufficient documentation

## 2019-06-13 DIAGNOSIS — W01198A Fall on same level from slipping, tripping and stumbling with subsequent striking against other object, initial encounter: Secondary | ICD-10-CM | POA: Insufficient documentation

## 2019-06-13 DIAGNOSIS — I509 Heart failure, unspecified: Secondary | ICD-10-CM | POA: Insufficient documentation

## 2019-06-13 DIAGNOSIS — Y92002 Bathroom of unspecified non-institutional (private) residence single-family (private) house as the place of occurrence of the external cause: Secondary | ICD-10-CM | POA: Insufficient documentation

## 2019-06-13 DIAGNOSIS — S42021A Displaced fracture of shaft of right clavicle, initial encounter for closed fracture: Secondary | ICD-10-CM | POA: Insufficient documentation

## 2019-06-13 DIAGNOSIS — F17210 Nicotine dependence, cigarettes, uncomplicated: Secondary | ICD-10-CM | POA: Insufficient documentation

## 2019-06-13 DIAGNOSIS — Y9389 Activity, other specified: Secondary | ICD-10-CM | POA: Insufficient documentation

## 2019-06-13 MED ORDER — OXYCODONE-ACETAMINOPHEN 5-325 MG PO TABS
1.0000 | ORAL_TABLET | Freq: Once | ORAL | Status: AC
  Administered 2019-06-13: 1 via ORAL
  Filled 2019-06-13: qty 1

## 2019-06-13 NOTE — ED Triage Notes (Signed)
Patient reports mechanical fall, struck arm on tub. Patient c/o right shoulder pain.

## 2019-06-14 ENCOUNTER — Emergency Department
Admission: EM | Admit: 2019-06-14 | Discharge: 2019-06-14 | Disposition: A | Discharge status: Home / Self Care | Payer: Self-pay | Attending: Emergency Medicine | Admitting: Emergency Medicine

## 2019-06-14 ENCOUNTER — Telehealth: Payer: Self-pay | Admitting: Emergency Medicine

## 2019-06-14 ENCOUNTER — Emergency Department: Payer: Self-pay

## 2019-06-14 DIAGNOSIS — S42021A Displaced fracture of shaft of right clavicle, initial encounter for closed fracture: Secondary | ICD-10-CM

## 2019-06-14 MED ORDER — OXYCODONE-ACETAMINOPHEN 5-325 MG PO TABS: 2.0000 | ORAL_TABLET | Freq: Four times a day (QID) | ORAL | 0 refills | Status: DC | PRN

## 2019-06-14 MED ORDER — DOCUSATE SODIUM 100 MG PO CAPS: ORAL_CAPSULE | ORAL | 0 refills | Status: DC

## 2019-06-14 MED ORDER — HYDROMORPHONE HCL 1 MG/ML IJ SOLN
1.0000 mg | Freq: Once | INTRAMUSCULAR | Status: AC
  Administered 2019-06-14: 1 mg via INTRAMUSCULAR
  Filled 2019-06-14: qty 1

## 2019-06-14 MED ORDER — OXYCODONE-ACETAMINOPHEN 5-325 MG PO TABS: 1.0000 | ORAL_TABLET | Freq: Four times a day (QID) | ORAL | 0 refills | Status: DC | PRN

## 2019-06-14 NOTE — ED Provider Notes (Signed)
[facility] Emergency Department Provider Note  ____________________________________________   First MD Initiated Contact with Patient 06/14/19 0104     (approximate)  I have reviewed the triage vital signs and the nursing notes.   HISTORY  Chief Complaint Shoulder Pain    HPI Anna Tapia is a 28 y.o. female with medical history as listed below who presents for evaluation of pain in her right shoulder after mechanical fall.  She reports that she slipped in the bathroom and struck her arm on the bathtub.  She did not hit her head and did not lose consciousness.  She has no headache or neck pain but has severe pain in her right shoulder that is worse with any amount of movement.  She has no numbness nor tingling in the hand but feels like she cannot move her arm because of the pain.  She has no other signs or symptoms and has not been exposed to COVID-19 recently of which she is aware.  She has no sore throat, chest pain, shortness of breath, nor cough.        Past Medical History:  Diagnosis Date   Asthma    Brain aneurysm    CHF (congestive heart failure) (HCC)     There are no active problems to display for this patient.   Past Surgical History:  Procedure Laterality Date   VENTRICULOPERITONEAL SHUNT      Prior to Admission medications   Medication Sig Start Date End Date Taking? Authorizing Provider  acetaminophen (TYLENOL) 325 MG tablet Take 650 mg by mouth every 6 (six) hours as needed.    [provider]  butalbital-acetaminophen-caffeine (FIORICET) [phone] MG tablet Take 1-2 tablets by mouth every 6 (six) hours as needed for headache. 06/11/19 06/10/20  Shaune PollackIsaacs, [place], MD  docusate sodium (COLACE) 100 MG capsule Take 1 tablet once or twice daily as needed for constipation while taking narcotic pain medicine 06/14/19   Loleta RoseForbach, Brinsley Wence, MD  oxyCODONE-acetaminophen (PERCOCET) 5-325 MG tablet Take 2 tablets by mouth every 6  (six) hours as needed for severe pain. 06/14/19   Loleta RoseForbach, Ismeal Heider, MD    Allergies Patient has no known allergies.  No family history on file.  Social History Social History   Tobacco Use   Smoking status: Current Every Day Smoker    Types: Cigarettes   Smokeless tobacco: Never Used  Substance Use Topics   Alcohol use: Yes    Comment: Rare   Drug use: No    Review of Systems Constitutional: No fever/chills ENT: No sore throat. Cardiovascular: Denies chest pain. Respiratory: Denies shortness of breath. Musculoskeletal: Pain in right shoulder. Integumentary: Negative for rash. Neurological: Negative for headaches, focal weakness or numbness.   ____________________________________________   PHYSICAL EXAM:  VITAL SIGNS: ED Triage Vitals  Enc Vitals Group     BP 06/13/19 2331 134/87     Pulse Rate 06/13/19 2331 94     Resp 06/13/19 2331 19     Temp 06/13/19 2331 98.9 F (37.2 C)     Temp src --      SpO2 06/13/19 2331 100 %     Weight 06/13/19 2332 54 kg (119 lb 0.8 oz)     Height --      Head Circumference --      Peak Flow --      Pain Score 06/13/19 2332 10     Pain Loc --      Pain Edu? --  Excl. in GC? --     Constitutional: Alert and oriented.  Appears to be in pain from the shoulder but without any other signs or symptoms or injuries. Eyes: Conjunctivae are normal.  Head: Atraumatic. Cardiovascular: Normal rate, regular rhythm. Good peripheral circulation. Respiratory: Normal respiratory effort.  No retractions. Musculoskeletal: Pain in the right shoulder reproducible with any attempted movement.  No gross deformity except for a slight bump in the middle of her right clavicle.  No tenting of the skin.  Neurovascularly intact distally down to the wrist. Neurologic:  Normal speech and language. No gross focal neurologic deficits are appreciated.  Skin:  Skin is warm, dry and intact.   ____________________________________________   LABS (all  labs ordered are listed, but only abnormal results are displayed)  Labs Reviewed - No data to display ____________________________________________  EKG  No indication for EKG ____________________________________________  RADIOLOGY I, Loleta Roseory Kamaljit Hizer, personally viewed and evaluated these images (plain radiographs) as part of my medical decision making, as well as reviewing the written report by the radiologist.  ED MD interpretation: Displaced distal clavicular fracture on the right    Official radiology report(s): Dg Shoulder Right  Result Date: 06/14/2019 CLINICAL DATA:  28 year old female with fall and right shoulder pain. EXAM: RIGHT SHOULDER - 2+ VIEW COMPARISON:  None. FINDINGS: There is a displaced fracture of the distal third of the right clavicle with full shaft width inferior displacement of the distal fracture fragment and approximately 2 cm overlap. No other acute fracture identified. There is no dislocation. The soft tissues are grossly unremarkable. IMPRESSION: Displaced fracture of the distal third of the right clavicle. Electronically Signed   By: Elgie CollardArash  Radparvar M.D.   On: 06/14/2019 00:29    ____________________________________________   PROCEDURES   Procedure(s) performed (including Critical Care):  Procedures   ____________________________________________   INITIAL IMPRESSION / MDM / ASSESSMENT AND PLAN / ED COURSE  As part of my medical decision making, I reviewed the following data within the electronic MEDICAL RECORD NUMBER Nursing notes reviewed and incorporated, Old chart reviewed, Radiograph reviewed , Discussed with orthopedics, Notes from prior ED visits and [place] Controlled Substance Database   Patient has a fracture of the distal third of the right clavicle with some displacement and overlap.  Neurovascular intact.  No other signs of trauma or injury and she is acting very appropriate in the room.  I gave her Dilaudid 1 mg intramuscular and in writing a  prescription for Percocet for her.  I discussed the case with Dr. Martha ClanKrasinski with orthopedics who will follow up with her in clinic and recommended immobilization and discharge.  The patient is comfortable with this plan.  I gave her my usual customary return precautions.          ____________________________________________  FINAL CLINICAL IMPRESSION(S) / ED DIAGNOSES  Final diagnoses:  Displaced fracture of shaft of right clavicle, initial encounter for closed fracture     MEDICATIONS GIVEN DURING THIS VISIT:  Medications  oxyCODONE-acetaminophen (PERCOCET/ROXICET) 5-325 MG per tablet 1 tablet (1 tablet Oral Given 06/13/19 2344)  HYDROmorphone (DILAUDID) injection 1 mg (1 mg Intramuscular Given 06/14/19 0151)     ED Discharge Orders         Ordered    oxyCODONE-acetaminophen (PERCOCET) 5-325 MG tablet  Every 6 hours PRN     06/14/19 0223    docusate sodium (COLACE) 100 MG capsule     06/14/19 0223          *Please note:  Anna Tapia was evaluated in Emergency Department on 06/14/2019 for the symptoms described in the history of present illness. She was evaluated in the context of the global COVID-19 pandemic, which necessitated consideration that the patient might be at risk for infection with the SARS-CoV-2 virus that causes COVID-19. Institutional protocols and algorithms that pertain to the evaluation of patients at risk for COVID-19 are in a state of rapid change based on information released by regulatory bodies including the CDC and federal and state organizations. These policies and algorithms were followed during the patient's care in the ED.  Some ED evaluations and interventions may be delayed as a result of limited staffing during the pandemic.*  Note:  This document was prepared using Dragon voice recognition software and may include unintentional dictation errors.   Hinda Kehr, MD 06/14/19 [phone]

## 2019-06-14 NOTE — Discharge Instructions (Signed)
You were seen today for an injury that resulted in a fracture of your clavicle.  Please use these sling provided as much as possible to keep the arm in place.  Take over-the-counter pain medicine as needed for discomfort or use the prescription medication for moderate to severe pain, but remember it will make you sleepy and may cause constipation.  Follow-up with the orthopedic doctor listed in his documentation as recommended.  Return to the emergency department if you develop new or worsening symptoms that concern you.  Take Percocet as prescribed for severe pain. Do not drink alcohol, drive or participate in any other potentially dangerous activities while taking this medication as it may make you sleepy. Do not take this medication with any other sedating medications, either prescription or over-the-counter. If you were prescribed Percocet or Vicodin, do not take these with acetaminophen (Tylenol) as it is already contained within these medications.   This medication is an opiate (or narcotic) pain medication and can be habit forming.  Use it as little as possible to achieve adequate pain control.  Do not use or use it with extreme caution if you have a history of opiate abuse or dependence.  If you are on a pain contract with your primary care doctor or a pain specialist, be sure to let them know you were prescribed this medication today from the [facility] Emergency Department.  This medication is intended for your use only - do not give any to anyone else and keep it in a secure place where nobody else, especially children, have access to it.  It will also cause or worsen constipation, so you may want to consider taking an over-the-counter stool softener while you are taking this medication.

## 2019-06-14 NOTE — Telephone Encounter (Signed)
[facility] not filling narcotic rx

## 2019-11-05 DIAGNOSIS — A419 Sepsis, unspecified organism: Secondary | ICD-10-CM

## 2019-11-05 HISTORY — DX: Urinary tract infection, site not specified: A41.9

## 2020-02-18 ENCOUNTER — Emergency Department
Admission: EM | Admit: 2020-02-18 | Discharge: 2020-02-18 | Disposition: A | Discharge status: Home / Self Care | Payer: Self-pay | Attending: Emergency Medicine | Admitting: Emergency Medicine

## 2020-02-18 ENCOUNTER — Other Ambulatory Visit: Payer: Self-pay

## 2020-02-18 ENCOUNTER — Encounter: Payer: Self-pay | Admitting: Emergency Medicine

## 2020-02-18 ENCOUNTER — Emergency Department: Payer: Self-pay

## 2020-02-18 DIAGNOSIS — K5900 Constipation, unspecified: Secondary | ICD-10-CM | POA: Insufficient documentation

## 2020-02-18 DIAGNOSIS — M25511 Pain in right shoulder: Secondary | ICD-10-CM | POA: Insufficient documentation

## 2020-02-18 DIAGNOSIS — J45909 Unspecified asthma, uncomplicated: Secondary | ICD-10-CM | POA: Insufficient documentation

## 2020-02-18 DIAGNOSIS — R110 Nausea: Secondary | ICD-10-CM | POA: Insufficient documentation

## 2020-02-18 DIAGNOSIS — F17210 Nicotine dependence, cigarettes, uncomplicated: Secondary | ICD-10-CM | POA: Insufficient documentation

## 2020-02-18 LAB — URINALYSIS, COMPLETE (UACMP) WITH MICROSCOPIC
Bilirubin Urine: NEGATIVE
Glucose, UA: NEGATIVE mg/dL
Hgb urine dipstick: NEGATIVE
Ketones, ur: NEGATIVE mg/dL
Leukocytes,Ua: NEGATIVE
Nitrite: NEGATIVE
Protein, ur: 30 mg/dL — AB
Specific Gravity, Urine: 1.03 (ref 1.005–1.030)
pH: 5 (ref 5.0–8.0)

## 2020-02-18 LAB — CBC
HCT: 40 % (ref 36.0–46.0)
Hemoglobin: 13.6 g/dL (ref 12.0–15.0)
MCH: 29.9 pg (ref 26.0–34.0)
MCHC: 34 g/dL (ref 30.0–36.0)
MCV: 87.9 fL (ref 80.0–100.0)
Platelets: 302 10*3/uL (ref 150–400)
RBC: 4.55 MIL/uL (ref 3.87–5.11)
RDW: 12.6 % (ref 11.5–15.5)
WBC: 6.5 10*3/uL (ref 4.0–10.5)
nRBC: 0 % (ref 0.0–0.2)

## 2020-02-18 LAB — COMPREHENSIVE METABOLIC PANEL
ALT: 27 U/L (ref 0–44)
AST: 27 U/L (ref 15–41)
Albumin: 4.1 g/dL (ref 3.5–5.0)
Alkaline Phosphatase: 47 U/L (ref 38–126)
Anion gap: 9 (ref 5–15)
BUN: 6 mg/dL (ref 6–20)
CO2: 24 mmol/L (ref 22–32)
Calcium: 8.8 mg/dL — ABNORMAL LOW (ref 8.9–10.3)
Chloride: 104 mmol/L (ref 98–111)
Creatinine, Ser: 0.66 mg/dL (ref 0.44–1.00)
GFR calc Af Amer: >60 mL/min (ref >60)
GFR calc non Af Amer: >60 mL/min (ref >60)
Glucose, Bld: 101 mg/dL — ABNORMAL HIGH (ref 70–99)
Potassium: 4 mmol/L (ref 3.5–5.1)
Sodium: 137 mmol/L (ref 135–145)
Total Bilirubin: 0.6 mg/dL (ref 0.3–1.2)
Total Protein: 7.7 g/dL (ref 6.5–8.1)

## 2020-02-18 LAB — LIPASE, BLOOD: Lipase: 30 U/L (ref 11–51)

## 2020-02-18 LAB — PREGNANCY, URINE: Preg Test, Ur: NEGATIVE

## 2020-02-18 MED ORDER — DOCUSATE SODIUM 50 MG/5ML PO LIQD
100.0000 mg | Freq: Once | ORAL | Status: AC
  Administered 2020-02-18: 100 mg via ORAL
  Filled 2020-02-18: qty 10

## 2020-02-18 MED ORDER — MAGNESIUM CITRATE PO SOLN
1.0000 | Freq: Once | ORAL | Status: AC
  Administered 2020-02-18: 17:00:00 1 via ORAL
  Filled 2020-02-18: qty 296

## 2020-02-18 MED ORDER — SODIUM CHLORIDE 0.9% FLUSH: 3.0000 mL | Freq: Once | INTRAVENOUS | Status: DC

## 2020-02-18 NOTE — ED Triage Notes (Signed)
Pt to ED via POV c/o nausea. Pt states that for the past few days she has been having nausea and unable to eat. Pt states that anytime she tries to eat anything it hurts her stomach. Pt also reports having right should pain. Pt states that last time she was here she was told that she had a broken collar bone. Pt does not believe that it healed correctly, pt has not been able to follow up because she cannot afford it. Pt would also liked this looked at while she is here. Pt is in NAD.

## 2020-02-18 NOTE — Discharge Instructions (Addendum)
You may take naproxen (Aleve) 1 twice a day with food every day until your shoulder feels better.  You may also use ice as needed for discomfort.  X-rays also showed that you have constipation.  You should increase fiber in your diet.  Drink lots of water.  The medication that is given to you to take home is a stool softener plus medication to help with your constipation.  You should plan on drinking at least half the bottle, then drinking 16 ounces of water.  Drink the other half of the bottle later this evening and drink water with that as well.  You may also obtain glycerin suppositories which are over-the-counter and inexpensive.  This will loosen up the stool and your rectal area.  If this becomes a frequent problem you may want to look at a bottle of MiraLAX and mix a capful of this with any liquid first thing in the morning and drink 2 bottles of water with it.

## 2020-02-18 NOTE — ED Notes (Signed)
See triage note  Presents with 2-3 days of nausea  Denies any vomiting  No fever  Also having right shoulder pain  States she had a fx clavicle last year and thinks that is where the pain is coming from

## 2020-02-18 NOTE — ED Provider Notes (Signed)
[facility] Emergency Department Provider Note   ____________________________________________   First MD Initiated Contact with Patient 02/18/20 1428     (approximate)  I have reviewed the triage vital signs and the nursing notes.   HISTORY  Chief Complaint Nausea and Shoulder Pain   HPI Anna Tapia is a 29 y.o. female presents to the ED with 2 complaints.  Patient first wants to have an x-ray of her right clavicle as she was seen in August 2020 for a fracture but never followed up with the orthopedist.  She states that there is pain coming from that area and wants to make sure that the clavicle has healed properly.  The other is nausea without vomiting.  She states that anytime she eats it hurts her stomach.  She also gives a history of constipation with having a difficult time having a BM last night.  She denies any fever or chills.       Past Medical History:  Diagnosis Date  . Asthma   . Brain aneurysm   . CHF (congestive heart failure) (HCC)     There are no problems to display for this patient.   Past Surgical History:  Procedure Laterality Date  . VENTRICULOPERITONEAL SHUNT      Prior to Admission medications   Medication Sig Start Date End Date Taking? Authorizing Provider  acetaminophen (TYLENOL) 325 MG tablet Take 650 mg by mouth every 6 (six) hours as needed.    [provider]    Allergies Patient has no known allergies.  No family history on file.  Social History Social History   Tobacco Use  . Smoking status: Current Every Day Smoker    Types: Cigarettes  . Smokeless tobacco: Never Used  Substance Use Topics  . Alcohol use: Yes    Comment: Rare  . Drug use: No    Review of Systems Constitutional: No fever/chills Eyes: No visual changes. ENT: No sore throat. Cardiovascular: Denies chest pain. Respiratory: Denies shortness of breath. Gastrointestinal: Positive abdominal pain.  Positive nausea, no  vomiting.  No diarrhea.  Positive constipation. Genitourinary: Negative for dysuria. Musculoskeletal: Painful right clavicle. Skin: Negative for rash. Neurological: Negative for headaches, focal weakness or numbness. ____________________________________________   PHYSICAL EXAM:  VITAL SIGNS: ED Triage Vitals  Enc Vitals Group     BP 02/18/20 1221 122/75     Pulse Rate 02/18/20 1221 85     Resp 02/18/20 1221 16     Temp 02/18/20 1221 98.8 F (37.1 C)     Temp Source 02/18/20 1221 Oral     SpO2 02/18/20 1221 99 %     Weight 02/18/20 1222 120 lb (54.4 kg)     Height 02/18/20 1222 5' (1.524 m)     Head Circumference --      Peak Flow --      Pain Score 02/18/20 1222 8     Pain Loc --      Pain Edu? --      Excl. in [place]? --     Constitutional: Alert and oriented. Well appearing and in no acute distress. Eyes: Conjunctivae are normal. PERRL. EOMI. Head: Atraumatic. Neck: No stridor.   Cardiovascular: Normal rate, regular rhythm. Grossly normal heart sounds.  Good peripheral circulation. Respiratory: Normal respiratory effort.  No retractions. Lungs CTAB. Gastrointestinal: Soft and nontender. No distention.  Bowel sounds are normoactive x4 quadrants. Musculoskeletal: Tender right clavicle with no new skin abrasions or discoloration.  Patient is ambulatory without  any assistance. Neurologic:  Normal speech and language. No gross focal neurologic deficits are appreciated. No gait instability. Skin:  Skin is warm, dry and intact. No rash noted. Psychiatric: Mood and affect are normal. Speech and behavior are normal.  ____________________________________________   LABS (all labs ordered are listed, but only abnormal results are displayed)  Labs Reviewed  COMPREHENSIVE METABOLIC PANEL - Abnormal; Notable for the following components:      Result Value   Glucose, Bld 101 (*)    Calcium 8.8 (*)    All other components within normal limits  URINALYSIS, COMPLETE (UACMP) WITH  MICROSCOPIC - Abnormal; Notable for the following components:   Color, Urine YELLOW (*)    APPearance CLOUDY (*)    Protein, ur 30 (*)    Bacteria, UA RARE (*)    All other components within normal limits  LIPASE, BLOOD  CBC  PREGNANCY, URINE     RADIOLOGY   Official radiology report(s): DG Clavicle Right  Result Date: 02/18/2020 CLINICAL DATA:  Right clavicle pain after falling in October 2020 EXAM: RIGHT CLAVICLE - 2+ VIEWS COMPARISON:  06/14/2019 FINDINGS: Interval healing of the previously demonstrated displaced right clavicle fracture. There is 1 shaft width of inferior displacement of the distal fragment with 1.3 cm of overlapping of the fragments. No acute fracture or dislocation is seen. IMPRESSION: Interval healing of the previously demonstrated displaced right clavicle fracture. Electronically Signed   By: Beckie Salts M.D.   On: 02/18/2020 16:16   DG Abdomen 1 View  Result Date: 02/18/2020 CLINICAL DATA:  Constipation and nausea for the past 3 days. EXAM: ABDOMEN - 1 VIEW COMPARISON:  09/22/2017. FINDINGS: Normal bowel gas pattern. Mildly prominent stool in the right colon, including the cecum located in the central pelvis on the right. Tortuous and mildly dilated gas-filled sigmoid colon in the left mid abdomen. Gas in normal caliber rectosigmoid junction region with no visible gas in the rectum. Mild levoconvex thoracolumbar rotary scoliosis. IMPRESSION: 1. Prominent stool in the right colon. 2. Mild ileus involving the sigmoid colon. Electronically Signed   By: Beckie Salts M.D.   On: 02/18/2020 16:19    ____________________________________________   PROCEDURES  Procedure(s) performed (including Critical Care):  Procedures   ____________________________________________   INITIAL IMPRESSION / ASSESSMENT AND PLAN / ED COURSE  As part of my medical decision making, I reviewed the following data within the electronic MEDICAL RECORD NUMBER Notes from prior ED visits and [place]  Controlled Substance Database  29 year old female presents to the ED with complaint of right shoulder pain and is worried about her fractured clavicle that happened in August 2020. She wants her shoulder x-ray so that she can see if it healed right. She also has had some issues with constipation and has been unable to have a BM in the last 2 days. She states that she had a small one last evening which was difficult. X-ray abdomen shows constipation and patient was made aware. She was given a combination of Colace 100 mg suspension mixed with a bottle of mag citrate. She was given instructions to drink half the bottle when she gets home and follow it by 2 bottles of water. She may drink the other half of the mag citrate Colace combination later this evening and continue drinking water. She also is encouraged to increase fluids on a daily basis and also vegetables and fruits.  ____________________________________________   FINAL CLINICAL IMPRESSION(S) / ED DIAGNOSES  Final diagnoses:  Right shoulder pain, unspecified chronicity  Constipation, unspecified constipation type     ED Discharge Orders    None       Note:  This document was prepared using Dragon voice recognition software and may include unintentional dictation errors.    Tommi Rumps, PA-C 02/18/20 Marcene Brawn, MD 02/19/20 [phone]

## 2020-03-04 ENCOUNTER — Emergency Department
Admission: EM | Admit: 2020-03-04 | Discharge: 2020-03-04 | Disposition: A | Discharge status: Home / Self Care | Payer: Medicaid Other | Attending: Emergency Medicine | Admitting: Emergency Medicine

## 2020-03-04 ENCOUNTER — Other Ambulatory Visit: Payer: Self-pay

## 2020-03-04 DIAGNOSIS — J45909 Unspecified asthma, uncomplicated: Secondary | ICD-10-CM | POA: Insufficient documentation

## 2020-03-04 DIAGNOSIS — I509 Heart failure, unspecified: Secondary | ICD-10-CM | POA: Insufficient documentation

## 2020-03-04 DIAGNOSIS — Z79899 Other long term (current) drug therapy: Secondary | ICD-10-CM | POA: Insufficient documentation

## 2020-03-04 DIAGNOSIS — F17210 Nicotine dependence, cigarettes, uncomplicated: Secondary | ICD-10-CM | POA: Insufficient documentation

## 2020-03-04 DIAGNOSIS — N760 Acute vaginitis: Secondary | ICD-10-CM

## 2020-03-04 DIAGNOSIS — K5900 Constipation, unspecified: Secondary | ICD-10-CM

## 2020-03-04 DIAGNOSIS — B9689 Other specified bacterial agents as the cause of diseases classified elsewhere: Secondary | ICD-10-CM

## 2020-03-04 DIAGNOSIS — K640 First degree hemorrhoids: Secondary | ICD-10-CM

## 2020-03-04 LAB — COMPREHENSIVE METABOLIC PANEL
ALT: 24 U/L (ref 0–44)
AST: 28 U/L (ref 15–41)
Albumin: 4.4 g/dL (ref 3.5–5.0)
Alkaline Phosphatase: 52 U/L (ref 38–126)
Anion gap: 9 (ref 5–15)
BUN: 9 mg/dL (ref 6–20)
CO2: 21 mmol/L — ABNORMAL LOW (ref 22–32)
Calcium: 8.9 mg/dL (ref 8.9–10.3)
Chloride: 106 mmol/L (ref 98–111)
Creatinine, Ser: 0.69 mg/dL (ref 0.44–1.00)
GFR calc Af Amer: >60 mL/min (ref >60)
GFR calc non Af Amer: >60 mL/min (ref >60)
Glucose, Bld: 146 mg/dL — ABNORMAL HIGH (ref 70–99)
Potassium: 3.5 mmol/L (ref 3.5–5.1)
Sodium: 136 mmol/L (ref 135–145)
Total Bilirubin: 0.8 mg/dL (ref 0.3–1.2)
Total Protein: 8 g/dL (ref 6.5–8.1)

## 2020-03-04 LAB — CBC
HCT: 40.1 % (ref 36.0–46.0)
Hemoglobin: 13.4 g/dL (ref 12.0–15.0)
MCH: 29.4 pg (ref 26.0–34.0)
MCHC: 33.4 g/dL (ref 30.0–36.0)
MCV: 87.9 fL (ref 80.0–100.0)
Platelets: 312 10*3/uL (ref 150–400)
RBC: 4.56 MIL/uL (ref 3.87–5.11)
RDW: 12.1 % (ref 11.5–15.5)
WBC: 7.4 10*3/uL (ref 4.0–10.5)
nRBC: 0 % (ref 0.0–0.2)

## 2020-03-04 LAB — CHLAMYDIA/NGC RT PCR (ARMC ONLY)
Chlamydia Tr: NOT DETECTED
N gonorrhoeae: NOT DETECTED

## 2020-03-04 LAB — URINALYSIS, COMPLETE (UACMP) WITH MICROSCOPIC
Bacteria, UA: NONE SEEN
Bilirubin Urine: NEGATIVE
Glucose, UA: NEGATIVE mg/dL
Ketones, ur: NEGATIVE mg/dL
Nitrite: NEGATIVE
Protein, ur: NEGATIVE mg/dL
Specific Gravity, Urine: 1.023 (ref 1.005–1.030)
pH: 5 (ref 5.0–8.0)

## 2020-03-04 LAB — WET PREP, GENITAL
Sperm: NONE SEEN
Trich, Wet Prep: NONE SEEN
Yeast Wet Prep HPF POC: NONE SEEN

## 2020-03-04 LAB — TYPE AND SCREEN
ABO/RH(D): B POS
Antibody Screen: NEGATIVE

## 2020-03-04 LAB — LIPASE, BLOOD: Lipase: 32 U/L (ref 11–51)

## 2020-03-04 LAB — POCT PREGNANCY, URINE: Preg Test, Ur: NEGATIVE

## 2020-03-04 MED ORDER — HYDROCORTISONE ACETATE 25 MG RE SUPP
25.0000 mg | Freq: Once | RECTAL | Status: AC
  Administered 2020-03-04: 16:00:00 25 mg via RECTAL
  Filled 2020-03-04: qty 1

## 2020-03-04 MED ORDER — ONDANSETRON 4 MG PO TBDP
4.0000 mg | ORAL_TABLET | Freq: Once | ORAL | Status: AC
  Administered 2020-03-04: 4 mg via ORAL
  Filled 2020-03-04: qty 1

## 2020-03-04 MED ORDER — HYDROCORTISONE ACETATE 25 MG RE SUPP: 25.0000 mg | Freq: Two times a day (BID) | RECTAL | 0 refills | Status: AC

## 2020-03-04 MED ORDER — METRONIDAZOLE 500 MG PO TABS: 500.0000 mg | ORAL_TABLET | Freq: Two times a day (BID) | ORAL | 0 refills | Status: AC

## 2020-03-04 MED ORDER — ONDANSETRON 4 MG PO TBDP: 4.0000 mg | ORAL_TABLET | Freq: Three times a day (TID) | ORAL | 0 refills | Status: DC | PRN

## 2020-03-04 MED ORDER — DIBUCAINE (PERIANAL) 1 % EX OINT
1.0000 | TOPICAL_OINTMENT | Freq: Three times a day (TID) | CUTANEOUS | 0 refills | Status: DC | PRN
Start: 2020-03-04 — End: 2022-04-16

## 2020-03-04 MED ORDER — METRONIDAZOLE 500 MG PO TABS
500.0000 mg | ORAL_TABLET | Freq: Once | ORAL | Status: AC
  Administered 2020-03-04: 17:00:00 500 mg via ORAL
  Filled 2020-03-04: qty 1

## 2020-03-04 MED ORDER — POLYETHYLENE GLYCOL 3350 17 G PO PACK
17.0000 g | PACK | Freq: Every day | ORAL | Status: DC
  Administered 2020-03-04: 16:00:00 17 g via ORAL
  Filled 2020-03-04: qty 1

## 2020-03-04 NOTE — Discharge Instructions (Signed)
You are being treated for hemorrhoids due to constipation and BV. Use the suppositories as directed. Start Miralax daily as a stool softener. Take the antibiotic as directed for BV. Follow-up with [facility] for continued symptoms and routine care. Increase your fiber-rich food intake.

## 2020-03-04 NOTE — ED Provider Notes (Signed)
Medical screening examination/treatment/procedure(s) were conducted as a shared visit with non-physician practitioner(s) and myself.  I personally evaluated the patient during the encounter.    Sharyn Creamer, MD 03/04/20 1550

## 2020-03-04 NOTE — ED Triage Notes (Signed)
FIRST NURSE NOTE: Pt c/o stomach pain and nausea, seen on 4/16 for similar issues.

## 2020-03-04 NOTE — ED Triage Notes (Signed)
Pt states nausea and abd pain. Seen on 4/16 for same. States been taking colace and drinking magnesium. States now having blood in stool, states formed no liquid.   Pt requesting to be checked for STD.

## 2020-03-09 NOTE — ED Provider Notes (Signed)
[facility] Emergency Department Provider Note ____________________________________________  Time seen: 1421  I have reviewed the triage vital signs and the nursing notes.  HISTORY  Chief Complaint  Nausea  HPI Anna Tapia is a 29 y.o. female presents her self to the ED with complaints of nausea abdominal pain.  Patient was seen on 4/16, for similar symptoms.   She has been taking the Colace intermittently and drinking the magnesium citrate, but denies any significant benefit.  She continues to eat, and describes food that a carb rich and even heavy in dairy and cheese.  She presented today because she noted bright red blood on the toilet tissue after having a bowel movement.  She describes the stool was formed and denies any diarrhea.  She gives a history of chronic slow outlet constipation.  She admits to not following up with GI as she had been previously suggested, citing financial constraints.  She also reports she has been poorly compliant with taking over-the-counter daily stool softeners.  Patient denies any interim fevers, chills, or sweats.  She also has a secondary request for STD check, citing some vaginal discharge.  Past Medical History:  Diagnosis Date  . Asthma   . Brain aneurysm   . CHF (congestive heart failure) (HCC)     There are no problems to display for this patient.   Past Surgical History:  Procedure Laterality Date  . VENTRICULOPERITONEAL SHUNT      Prior to Admission medications   Medication Sig Start Date End Date Taking? Authorizing Provider  acetaminophen (TYLENOL) 325 MG tablet Take 650 mg by mouth every 6 (six) hours as needed.    [provider]  dibucaine (NUPERCAINAL) 1 % OINT Place 1 application rectally 3 (three) times daily as needed. 03/04/20   Purity Irmen, Charlesetta Ivory, PA-C  hydrocortisone (ANUSOL-HC) 25 MG suppository Place 1 suppository (25 mg total) rectally every 12 (twelve) hours for 6 days. 03/04/20  03/10/20  Bomani Oommen, Charlesetta Ivory, PA-C  metroNIDAZOLE (FLAGYL) 500 MG tablet Take 1 tablet (500 mg total) by mouth 2 (two) times daily for 7 days. 03/05/20 03/12/20  Jaionna Weisse, Charlesetta Ivory, PA-C  ondansetron (ZOFRAN ODT) 4 MG disintegrating tablet Take 1 tablet (4 mg total) by mouth every 8 (eight) hours as needed. 03/04/20   Jazariah Teall, Charlesetta Ivory, PA-C    Allergies Patient has no known allergies.  History reviewed. No pertinent family history.  Social History Social History   Tobacco Use  . Smoking status: Current Every Day Smoker    Types: Cigarettes  . Smokeless tobacco: Never Used  Substance Use Topics  . Alcohol use: Yes    Comment: Rare  . Drug use: No    Review of Systems  Constitutional: Negative for fever. Cardiovascular: Negative for chest pain. Respiratory: Negative for shortness of breath. Gastrointestinal: Negative for vomiting and diarrhea.  Reports nausea and lower abdominal pain bright red blood on the toilet tissue. Genitourinary: Negative for dysuria.  Reports vaginal discharge as above. Musculoskeletal: Negative for back pain. Skin: Negative for rash. Neurological: Negative for headaches, focal weakness or numbness. ____________________________________________  PHYSICAL EXAM:  VITAL SIGNS: ED Triage Vitals  Enc Vitals Group     BP 03/04/20 1245 136/79     Pulse Rate 03/04/20 1245 100     Resp 03/04/20 1245 16     Temp 03/04/20 1245 98.4 F (36.9 C)     Temp Source 03/04/20 1245 Oral     SpO2 03/04/20 1245 99 %  Weight 03/04/20 1245 120 lb (54.4 kg)     Height 03/04/20 1245 5' (1.524 m)     Head Circumference --      Peak Flow --      Pain Score 03/04/20 1249 7     Pain Loc --      Pain Edu? --      Excl. in [place]? --     Constitutional: Alert and oriented. Well appearing and in no distress. Head: Normocephalic and atraumatic. Eyes: Conjunctivae are normal. Normal extraocular movements Cardiovascular: Normal rate, regular rhythm. Normal distal  pulses. Respiratory: Normal respiratory effort. No wheezes/rales/rhonchi. Gastrointestinal: Soft and nontender. No distention, rebound, guarding, or rigidity.  No CVA tenderness is elicited. Tenderness on digital rectal exam, consistent with internal hemorrhoid.  GU: Normal external genitalia.  Whitish discharge noted in the vault.  No CMT or adnexal masses appreciated. Musculoskeletal: Nontender with normal range of motion in all extremities.  Neurologic:  Normal gait without ataxia. Normal speech and language. No gross focal neurologic deficits are appreciated. ____________________________________________   LABS (pertinent positives/negatives) Labs Reviewed  WET PREP, GENITAL - Abnormal; Notable for the following components:      Result Value   Clue Cells Wet Prep HPF POC PRESENT (*)    WBC, Wet Prep HPF POC MANY (*)    All other components within normal limits  COMPREHENSIVE METABOLIC PANEL - Abnormal; Notable for the following components:   CO2 21 (*)    Glucose, Bld 146 (*)    All other components within normal limits  URINALYSIS, COMPLETE (UACMP) WITH MICROSCOPIC - Abnormal; Notable for the following components:   Color, Urine YELLOW (*)    APPearance CLOUDY (*)    Hgb urine dipstick SMALL (*)    Leukocytes,Ua SMALL (*)    All other components within normal limits  CHLAMYDIA/NGC RT PCR ([facility] ONLY)  LIPASE, BLOOD  CBC  POC URINE PREG, ED  POCT PREGNANCY, URINE  TYPE AND SCREEN  ____________________________________________  PROCEDURES  Cortisone suppository 1 PR Zofran 4 mg ODT Miralax 17 g PO Metronidazole 500 mg PO  Procedures ____________________________________________  INITIAL IMPRESSION / ASSESSMENT AND PLAN / ED COURSE  Patient with ED evaluation of abdominal pain and nausea as well as bright red blood on toilet tissue.  Patient's exam is consistent with likely internal hemorrhoid and acute rectal bleeding.  Labs are reassuring at this time.  Wet prep  did reveal clue cells.  Patient be treated for rectal pain due to hemorrhoids as well as BV.  Prescriptions for cortisone suppositories, metronidazole, Zofran are provided for her benefit.  She is to follow-up with multiple community clinic for ongoing symptom management.  She is again referred to GI for ongoing treatment.  Anna Tapia was evaluated in Emergency Department on 03/09/2020 for the symptoms described in the history of present illness. She was evaluated in the context of the global COVID-19 pandemic, which necessitated consideration that the patient might be at risk for infection with the SARS-CoV-2 virus that causes COVID-19. Institutional protocols and algorithms that pertain to the evaluation of patients at risk for COVID-19 are in a state of rapid change based on information released by regulatory bodies including the CDC and federal and state organizations. These policies and algorithms were followed during the patient's care in the ED. ____________________________________________  FINAL CLINICAL IMPRESSION(S) / ED DIAGNOSES  Final diagnoses:  Constipation, unspecified constipation type  Grade I hemorrhoids  BV (bacterial vaginosis)      Penni Penado V  Loreli Slot 03/09/20 2012    Delman Kitten, MD 03/11/20 [phone]

## 2020-05-01 ENCOUNTER — Inpatient Hospital Stay
Admission: EM | Admit: 2020-05-01 | Discharge: 2020-05-04 | DRG: 872 | Disposition: A | Discharge status: Home / Self Care | Payer: Self-pay | Attending: Internal Medicine | Admitting: Internal Medicine

## 2020-05-01 ENCOUNTER — Encounter: Payer: Self-pay | Admitting: Intensive Care

## 2020-05-01 ENCOUNTER — Other Ambulatory Visit: Payer: Self-pay

## 2020-05-01 ENCOUNTER — Emergency Department: Payer: Self-pay

## 2020-05-01 DIAGNOSIS — N83201 Unspecified ovarian cyst, right side: Secondary | ICD-10-CM

## 2020-05-01 DIAGNOSIS — J45909 Unspecified asthma, uncomplicated: Secondary | ICD-10-CM | POA: Diagnosis present

## 2020-05-01 DIAGNOSIS — F17210 Nicotine dependence, cigarettes, uncomplicated: Secondary | ICD-10-CM | POA: Diagnosis present

## 2020-05-01 DIAGNOSIS — R7989 Other specified abnormal findings of blood chemistry: Secondary | ICD-10-CM | POA: Diagnosis present

## 2020-05-01 DIAGNOSIS — A419 Sepsis, unspecified organism: Secondary | ICD-10-CM

## 2020-05-01 DIAGNOSIS — N12 Tubulo-interstitial nephritis, not specified as acute or chronic: Secondary | ICD-10-CM

## 2020-05-01 DIAGNOSIS — N390 Urinary tract infection, site not specified: Secondary | ICD-10-CM

## 2020-05-01 DIAGNOSIS — Z982 Presence of cerebrospinal fluid drainage device: Secondary | ICD-10-CM

## 2020-05-01 DIAGNOSIS — N939 Abnormal uterine and vaginal bleeding, unspecified: Secondary | ICD-10-CM | POA: Diagnosis present

## 2020-05-01 DIAGNOSIS — Z20822 Contact with and (suspected) exposure to covid-19: Secondary | ICD-10-CM | POA: Diagnosis present

## 2020-05-01 DIAGNOSIS — N10 Acute tubulo-interstitial nephritis: Secondary | ICD-10-CM | POA: Diagnosis present

## 2020-05-01 DIAGNOSIS — A4151 Sepsis due to Escherichia coli [E. coli]: Principal | ICD-10-CM | POA: Diagnosis present

## 2020-05-01 DIAGNOSIS — Z8679 Personal history of other diseases of the circulatory system: Secondary | ICD-10-CM

## 2020-05-01 LAB — COMPREHENSIVE METABOLIC PANEL
ALT: 68 U/L — ABNORMAL HIGH (ref 0–44)
AST: 61 U/L — ABNORMAL HIGH (ref 15–41)
Albumin: 4.3 g/dL (ref 3.5–5.0)
Alkaline Phosphatase: 68 U/L (ref 38–126)
Anion gap: 11 (ref 5–15)
BUN: 7 mg/dL (ref 6–20)
CO2: 24 mmol/L (ref 22–32)
Calcium: 9.2 mg/dL (ref 8.9–10.3)
Chloride: 102 mmol/L (ref 98–111)
Creatinine, Ser: 0.78 mg/dL (ref 0.44–1.00)
GFR calc Af Amer: >60 mL/min (ref >60)
GFR calc non Af Amer: >60 mL/min (ref >60)
Glucose, Bld: 95 mg/dL (ref 70–99)
Potassium: 3.6 mmol/L (ref 3.5–5.1)
Sodium: 137 mmol/L (ref 135–145)
Total Bilirubin: 0.6 mg/dL (ref 0.3–1.2)
Total Protein: 7.9 g/dL (ref 6.5–8.1)

## 2020-05-01 LAB — CBC
HCT: 37.9 % (ref 36.0–46.0)
Hemoglobin: 13.1 g/dL (ref 12.0–15.0)
MCH: 30.1 pg (ref 26.0–34.0)
MCHC: 34.6 g/dL (ref 30.0–36.0)
MCV: 87.1 fL (ref 80.0–100.0)
Platelets: 350 10*3/uL (ref 150–400)
RBC: 4.35 MIL/uL (ref 3.87–5.11)
RDW: 12.4 % (ref 11.5–15.5)
WBC: 20.2 10*3/uL — ABNORMAL HIGH (ref 4.0–10.5)
nRBC: 0 % (ref 0.0–0.2)

## 2020-05-01 LAB — URINALYSIS, COMPLETE (UACMP) WITH MICROSCOPIC
Bilirubin Urine: NEGATIVE
Glucose, UA: NEGATIVE mg/dL
Ketones, ur: NEGATIVE mg/dL
Nitrite: POSITIVE — AB
Protein, ur: 100 mg/dL — AB
Specific Gravity, Urine: 1.012 (ref 1.005–1.030)
WBC, UA: >50 WBC/hpf — ABNORMAL HIGH (ref 0–5)
pH: 6 (ref 5.0–8.0)

## 2020-05-01 LAB — POCT PREGNANCY, URINE: Preg Test, Ur: NEGATIVE

## 2020-05-01 LAB — LIPASE, BLOOD: Lipase: 25 U/L (ref 11–51)

## 2020-05-01 LAB — LACTIC ACID, PLASMA: Lactic Acid, Venous: 1.1 mmol/L (ref 0.5–1.9)

## 2020-05-01 MED ORDER — ACETAMINOPHEN 650 MG RE SUPP: 650.0000 mg | Freq: Four times a day (QID) | RECTAL | Status: DC | PRN

## 2020-05-01 MED ORDER — ACETAMINOPHEN 325 MG PO TABS
650.0000 mg | ORAL_TABLET | Freq: Four times a day (QID) | ORAL | Status: DC | PRN
  Administered 2020-05-03 – 2020-05-04 (×2): 650 mg via ORAL
  Filled 2020-05-01 (×2): qty 2

## 2020-05-01 MED ORDER — OXYCODONE-ACETAMINOPHEN 5-325 MG PO TABS
1.0000 | ORAL_TABLET | ORAL | Status: AC | PRN
  Administered 2020-05-01 (×2): 1 via ORAL
  Filled 2020-05-01 (×2): qty 1

## 2020-05-01 MED ORDER — SODIUM CHLORIDE 0.9 % IV SOLN
1.0000 g | Freq: Once | INTRAVENOUS | Status: AC
  Administered 2020-05-01: 1 g via INTRAVENOUS
  Filled 2020-05-01: qty 10

## 2020-05-01 MED ORDER — ONDANSETRON HCL 4 MG PO TABS: 4.0000 mg | ORAL_TABLET | Freq: Four times a day (QID) | ORAL | Status: DC | PRN

## 2020-05-01 MED ORDER — ACETAMINOPHEN 325 MG PO TABS
650.0000 mg | ORAL_TABLET | Freq: Once | ORAL | Status: AC | PRN
  Administered 2020-05-01: 650 mg via ORAL
  Filled 2020-05-01: qty 2

## 2020-05-01 MED ORDER — MORPHINE SULFATE (PF) 4 MG/ML IV SOLN: 4.0000 mg | Freq: Once | INTRAVENOUS | Status: AC

## 2020-05-01 MED ORDER — SODIUM CHLORIDE 0.9 % IV SOLN
1.0000 g | INTRAVENOUS | Status: DC
  Administered 2020-05-02 – 2020-05-03 (×2): 1 g via INTRAVENOUS
  Filled 2020-05-01 (×2): qty 1
  Filled 2020-05-01: qty 10

## 2020-05-01 MED ORDER — MORPHINE SULFATE (PF) 4 MG/ML IV SOLN
INTRAVENOUS | Status: AC
  Administered 2020-05-01: 4 mg
  Filled 2020-05-01: qty 1

## 2020-05-01 MED ORDER — LACTATED RINGERS IV BOLUS
1000.0000 mL | Freq: Once | INTRAVENOUS | Status: AC
  Administered 2020-05-01: 1000 mL via INTRAVENOUS

## 2020-05-01 MED ORDER — ONDANSETRON 4 MG PO TBDP
4.0000 mg | ORAL_TABLET | Freq: Once | ORAL | Status: AC | PRN
  Administered 2020-05-01: 4 mg via ORAL
  Filled 2020-05-01: qty 1

## 2020-05-01 MED ORDER — ONDANSETRON HCL 4 MG/2ML IJ SOLN
4.0000 mg | Freq: Four times a day (QID) | INTRAMUSCULAR | Status: DC | PRN
  Administered 2020-05-02 – 2020-05-03 (×5): 4 mg via INTRAVENOUS
  Filled 2020-05-01 (×5): qty 2

## 2020-05-01 MED ORDER — SODIUM CHLORIDE 0.9 % IV SOLN: INTRAVENOUS | Status: DC

## 2020-05-01 MED ORDER — KETOROLAC TROMETHAMINE 30 MG/ML IJ SOLN
30.0000 mg | Freq: Four times a day (QID) | INTRAMUSCULAR | Status: DC | PRN
  Administered 2020-05-02 (×2): 30 mg via INTRAVENOUS
  Filled 2020-05-01 (×3): qty 1

## 2020-05-01 MED ORDER — ONDANSETRON HCL 4 MG/2ML IJ SOLN
4.0000 mg | Freq: Once | INTRAMUSCULAR | Status: AC
  Administered 2020-05-01: 4 mg via INTRAVENOUS

## 2020-05-01 MED ORDER — HYDROCODONE-ACETAMINOPHEN 5-325 MG PO TABS
1.0000 | ORAL_TABLET | ORAL | Status: DC | PRN
  Administered 2020-05-02 – 2020-05-03 (×6): 2 via ORAL
  Filled 2020-05-01 (×6): qty 2

## 2020-05-01 MED ORDER — ENOXAPARIN SODIUM 40 MG/0.4ML ~~LOC~~ SOLN
40.0000 mg | SUBCUTANEOUS | Status: DC
  Filled 2020-05-01 (×2): qty 0.4

## 2020-05-01 MED ORDER — ONDANSETRON HCL 4 MG/2ML IJ SOLN
INTRAMUSCULAR | Status: AC
  Filled 2020-05-01: qty 2

## 2020-05-01 NOTE — ED Provider Notes (Signed)
[facility] Emergency Department Provider Note   ____________________________________________   First MD Initiated Contact with Patient 05/01/20 1734     (approximate)  I have reviewed the triage vital signs and the nursing notes.   HISTORY  Chief Complaint Abdominal Pain and Fever    HPI Anna Tapia is a 29 y.o. female with past medical history of remote CHF, brain aneurysm status post VP shunt who presents to the ED complaining of abdominal pain.  Patient reports that she developed pain last night in the right upper and lower quadrants of her abdomen that has extended into her right flank.  She describes it as a constant crampy pain that is not exacerbated or alleviated by anything.  She has noticed some vaginal spotting as well as dysuria, but denies any discharge.  She was not aware of any fevers until she checked into the ED and was told she had one.  She denies any cough, chest pain, shortness of breath.  She did feel nauseous and had one episode of vomiting just prior to arrival.  She denies any history of kidney stones, does not think she could be pregnant.        Past Medical History:  Diagnosis Date  . Asthma   . Brain aneurysm   . CHF (congestive heart failure) [facility])     Patient Active Problem List   Diagnosis Date Noted  . Sepsis secondary to UTI (HCC) 05/01/2020    Past Surgical History:  Procedure Laterality Date  . VENTRICULOPERITONEAL SHUNT      Prior to Admission medications   Medication Sig Start Date End Date Taking? Authorizing Provider  acetaminophen (TYLENOL) 325 MG tablet Take 650 mg by mouth every 6 (six) hours as needed.    [provider]  dibucaine (NUPERCAINAL) 1 % OINT Place 1 application rectally 3 (three) times daily as needed. 03/04/20   Menshew, Charlesetta Ivory, PA-C  ondansetron (ZOFRAN ODT) 4 MG disintegrating tablet Take 1 tablet (4 mg total) by mouth every 8 (eight) hours as needed. 03/04/20    Menshew, Charlesetta Ivory, PA-C    Allergies Patient has no known allergies.  History reviewed. No pertinent family history.  Social History Social History   Tobacco Use  . Smoking status: Current Every Day Smoker    Types: Cigarettes  . Smokeless tobacco: Never Used  Substance Use Topics  . Alcohol use: Yes    Comment: Rare  . Drug use: No    Review of Systems  Constitutional: Positive for fever/chills Eyes: No visual changes. ENT: No sore throat. Cardiovascular: Denies chest pain. Respiratory: Denies shortness of breath. Gastrointestinal: Positive for abdominal pain.  Positive for nausea and vomiting.  No diarrhea.  No constipation. Genitourinary: Negative for dysuria. Musculoskeletal: Negative for back pain. Skin: Negative for rash. Neurological: Negative for headaches, focal weakness or numbness.  ____________________________________________   PHYSICAL EXAM:  VITAL SIGNS: ED Triage Vitals  Enc Vitals Group     BP 05/01/20 1533 (!) 127/92     Pulse Rate 05/01/20 1533 (!) 115     Resp 05/01/20 1533 16     Temp 05/01/20 1533 (!) 101.4 F (38.6 C)     Temp Source 05/01/20 1533 Oral     SpO2 05/01/20 1533 98 %     Weight 05/01/20 1534 120 lb (54.4 kg)     Height 05/01/20 1534 5' (1.524 m)     Head Circumference --      Peak Flow --  Pain Score 05/01/20 1533 10     Pain Loc --      Pain Edu? --      Excl. in [place]? --     Constitutional: Alert and oriented. Eyes: Conjunctivae are normal. Head: Atraumatic. Nose: No congestion/rhinnorhea. Mouth/Throat: Mucous membranes are moist. Neck: Normal ROM Cardiovascular: Tachycardic, regular rhythm. Grossly normal heart sounds. Respiratory: Normal respiratory effort.  No retractions. Lungs CTAB. Gastrointestinal: Soft and tender to palpation in the right upper and right lower quadrants with right-sided CVA tenderness. No distention. Genitourinary: deferred Musculoskeletal: No lower extremity tenderness nor  edema. Neurologic:  Normal speech and language. No gross focal neurologic deficits are appreciated. Skin:  Skin is warm, dry and intact. No rash noted. Psychiatric: Mood and affect are normal. Speech and behavior are normal.  ____________________________________________   LABS (all labs ordered are listed, but only abnormal results are displayed)  Labs Reviewed  COMPREHENSIVE METABOLIC PANEL - Abnormal; Notable for the following components:      Result Value   AST 61 (*)    ALT 68 (*)    All other components within normal limits  CBC - Abnormal; Notable for the following components:   WBC 20.2 (*)    All other components within normal limits  URINALYSIS, COMPLETE (UACMP) WITH MICROSCOPIC - Abnormal; Notable for the following components:   Color, Urine YELLOW (*)    APPearance CLOUDY (*)    Hgb urine dipstick MODERATE (*)    Protein, ur 100 (*)    Nitrite POSITIVE (*)    Leukocytes,Ua LARGE (*)    WBC, UA >50 (*)    Bacteria, UA MANY (*)    All other components within normal limits  CULTURE, BLOOD (ROUTINE X 2)  CULTURE, BLOOD (ROUTINE X 2)  URINE CULTURE  SARS CORONAVIRUS 2 (TAT 6-24 HRS)  LIPASE, BLOOD  LACTIC ACID, PLASMA  LACTIC ACID, PLASMA  POC URINE PREG, ED  POCT PREGNANCY, URINE    PROCEDURES  Procedure(s) performed (including Critical Care):  Procedures   ____________________________________________   INITIAL IMPRESSION / ASSESSMENT AND PLAN / ED COURSE       29 year old female with possible history of remote CHF, brain aneurysm, and VP shunt who presents to the ED complaining of right-sided abdominal pain extending into her flank starting last night and continuing into today.  She has tenderness along the right side of her abdomen with right-sided CVA tenderness on my exam, UA is also concerning for infection consistent with pyelonephritis.  She was febrile on arrival to the ED and tachycardic, noted to have leukocytosis on labs.  She is overall  well-appearing, but we will check blood cultures and lactate, hydrate with IV fluids and treat with Rocephin.  We will check CT renal to rule out infected kidney stone.  CT is negative for kidney stones or appendicitis.  Patient continues to have pain and nausea following treatment with IV fluids, Rocephin, oxycodone, and Zofran.  We will treat with morphine and IV Zofran, continue hydration with IV fluids.  Patient meets sepsis criteria based on her temperature, tachycardia, and leukocytosis, case was discussed with hospitalist for admission.      ____________________________________________   FINAL CLINICAL IMPRESSION(S) / ED DIAGNOSES  Final diagnoses:  Pyelonephritis     ED Discharge Orders    None       Note:  This document was prepared using Dragon voice recognition software and may include unintentional dictation errors.   Chesley Noon, MD 05/01/20 [phone]

## 2020-05-01 NOTE — H&P (Signed)
History and Physical    Anna Tapia:[id number] DOB: 1991-02-10 DOA: 05/01/2020  PCP: Patient, No Pcp Per   Patient coming from: Home  I have personally briefly reviewed patient's old medical records in [facility]  Chief Complaint: Right flank pain, nausea vomiting  HPI: Anna Tapia is a 29 y.o. 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  No aggravating or alleviating factors.  She denies change in bowel habits.  She has no cough.  Did not detect a fever at home.  No affected contacts.  Denies headache, rash ED Course: On arrival in the ER, she was febrile at 101.4 and tachycardic at 115 with otherwise normal vitals.  Blood work significant for leukocytosis of 20,000, with lactic acid 1.1.  Urinalysis strongly consistent with UTI.  CT abdomen and pelvis was negative for stone but showed an incidental finding of a 5 cm midline cystic mass on the right ovary.  Patient was given sepsis fluid bolus, started on Rocephin.  Hospitalist consulted for admission.  Review of Systems: As per HPI otherwise all other systems on review of systems negative.    Past Medical History:  Diagnosis Date  . Asthma   . Brain aneurysm   . CHF (congestive heart failure) (HCC)     Past Surgical History:  Procedure Laterality Date  . VENTRICULOPERITONEAL SHUNT       reports that she has been smoking cigarettes. She has never used smokeless tobacco. She reports current alcohol use. She reports that she does not use drugs.  No Known Allergies  History reviewed. No pertinent family history.    Prior to Admission medications   Medication Sig Start Date End Date Taking?  Authorizing Provider  acetaminophen (TYLENOL) 325 MG tablet Take 650 mg by mouth every 6 (six) hours as needed.    [provider]  dibucaine (NUPERCAINAL) 1 % OINT Place 1 application rectally 3 (three) times daily as needed. 03/04/20   Menshew, Charlesetta Ivory, PA-C  ondansetron (ZOFRAN ODT) 4 MG disintegrating tablet Take 1 tablet (4 mg total) by mouth every 8 (eight) hours as needed. 03/04/20   Menshew, Charlesetta Ivory, PA-C    Physical Exam: Vitals:   05/01/20 1819 05/01/20 1820 05/01/20 1821 05/01/20 1821  BP:    114/74  Pulse: 77 72 74 78  Resp:    18  Temp:      TempSrc:      SpO2: 98% 99% 98% 100%  Weight:      Height:         Vitals:   05/01/20 1819 05/01/20 1820 05/01/20 1821 05/01/20 1821  BP:    114/74  Pulse: 77 72 74 78  Resp:    18  Temp:      TempSrc:      SpO2: 98% 99% 98% 100%  Weight:      Height:          Constitutional: Alert and oriented x 3 . Not in any apparent distress HEENT:      Head: Normocephalic and atraumatic.         Eyes: PERLA, EOMI, Conjunctivae are normal. Sclera is non-icteric.       Mouth/Throat: Mucous membranes  are moist.       Neck: Supple with no signs of meningismus. Cardiovascular: Regular rate and rhythm. No murmurs, gallops, or rubs. 2+ symmetrical distal pulses are present . No JVD. No LE edema Respiratory: Respiratory effort normal .Lungs sounds clear bilaterally. N wheezes, crackles, or rhonchi.  Gastrointestinal:  Low right abdominal wall tenderness, non distended with positive bowel sounds. No rebound or guarding. Genitourinary: mild CVA tenderness. Musculoskeletal: Nontender with normal range of motion in all extremities. No cyanosis, or erythema of extremities. Neurologic: Normal speech and language. Face is symmetric. Moving all extremities. No gross focal neurologic deficits . Skin: Skin is warm, dry.  No rash or ulcers Psychiatric: Mood and affect are normal Speech and behavior are normal   Labs on  Admission: I have personally reviewed following labs and imaging studies  CBC: Recent Labs  Lab 05/01/20 1544  WBC 20.2*  HGB 13.1  HCT 37.9  MCV 87.1  PLT 518   Basic Metabolic Panel: Recent Labs  Lab 05/01/20 1544  NA 137  K 3.6  CL 102  CO2 24  GLUCOSE 95  BUN 7  CREATININE 0.78  CALCIUM 9.2   GFR: Estimated Creatinine Clearance: 74.5 mL/min (by C-G formula based on SCr of 0.78 mg/dL). Liver Function Tests: Recent Labs  Lab 05/01/20 1544  AST 61*  ALT 68*  ALKPHOS 68  BILITOT 0.6  PROT 7.9  ALBUMIN 4.3   Recent Labs  Lab 05/01/20 1544  LIPASE 25   No results for input(s): AMMONIA in the last 168 hours. Coagulation Profile: No results for input(s): INR, PROTIME in the last 168 hours. Cardiac Enzymes: No results for input(s): CKTOTAL, CKMB, CKMBINDEX, TROPONINI in the last 168 hours. BNP (last 3 results) No results for input(s): PROBNP in the last 8760 hours. HbA1C: No results for input(s): HGBA1C in the last 72 hours. CBG: No results for input(s): GLUCAP in the last 168 hours. Lipid Profile: No results for input(s): CHOL, HDL, LDLCALC, TRIG, CHOLHDL, LDLDIRECT in the last 72 hours. Thyroid Function Tests: No results for input(s): TSH, T4TOTAL, FREET4, T3FREE, THYROIDAB in the last 72 hours. Anemia Panel: No results for input(s): VITAMINB12, FOLATE, FERRITIN, TIBC, IRON, RETICCTPCT in the last 72 hours. Urine analysis:    Component Value Date/Time   COLORURINE YELLOW (A) 05/01/2020 1544   APPEARANCEUR CLOUDY (A) 05/01/2020 1544   LABSPEC 1.012 05/01/2020 1544   PHURINE 6.0 05/01/2020 1544   GLUCOSEU NEGATIVE 05/01/2020 1544   HGBUR MODERATE (A) 05/01/2020 1544   BILIRUBINUR NEGATIVE 05/01/2020 1544   KETONESUR NEGATIVE 05/01/2020 1544   PROTEINUR 100 (A) 05/01/2020 1544   NITRITE POSITIVE (A) 05/01/2020 1544   LEUKOCYTESUR LARGE (A) 05/01/2020 1544    Radiological Exams on Admission: CT Renal Stone Study  Result Date: 05/01/2020 CLINICAL  DATA:  Right flank pain, nausea, vomiting EXAM: CT ABDOMEN AND PELVIS WITHOUT CONTRAST TECHNIQUE: Multidetector CT imaging of the abdomen and pelvis was performed following the standard protocol without IV contrast. COMPARISON:  None. FINDINGS: Lower chest: No acute abnormality. Hepatobiliary: No focal hepatic abnormality. Gallbladder unremarkable. Pancreas: No focal abnormality or ductal dilatation. Spleen: No focal abnormality.  Normal size. Adrenals/Urinary Tract: No adrenal abnormality. No focal renal abnormality. No stones or hydronephrosis. Urinary bladder is unremarkable. Stomach/Bowel: Normal appendix. Stomach, large and small bowel grossly unremarkable. Vascular/Lymphatic: No evidence of aneurysm or adenopathy. Reproductive: 5 cm cystic areas seen in the midline of the pelvis superior to the urinary bladder, this likely arises from the right ovary. Uterus and  left adnexa unremarkable. Other: No free fluid or free air. Musculoskeletal: No acute bony abnormality. IMPRESSION: No renal or ureteral stones.  No hydronephrosis. 5 cm midline cystic mass in the pelvis, likely of right ovarian origin. Normal appendix. Electronically Signed   By: Charlett Nose M.D.   On: 05/01/2020 18:45    EKG: Not done in ER  Assessment/Plan 29 year old female with history of a VP shunt in childhood and questionable history of childhood structural cardiac abnormality lost to cardiology follow-up who presents with right flank pain    Sepsis secondary to UTI [facility]) Suspect acute pyelonephritis -Patient presents with right flank pain, UTI on urinalysis, fever and tachycardia.  Lactic acid was normal at 1.1 -Continue IV fluids -Rocephin IV -Follow cultures    VP (ventriculoperitoneal) shunt status -No acute concerns    Right ovarian cyst -Incidental finding on CT abdomen and pelvis -Outpatient referral  Elevated LFTs -AST 61, ALT 68 -Possibly related to current acute infection.  Continue to monitor -Follow  hepatitis panel  Vaginal spotting -Patient reports spotting on her underwear that started along with UTI symptoms. -Suspect related to UTI but can refer to GYN as outpatient or inpatient consult if worsening    DVT prophylaxis: Lovenox  Code Status: full code  Family Communication:  none  Disposition Plan: Back to previous home environment Consults called: none  Status:obs    Andris Baumann MD [facility]     05/01/2020, 9:14 PM

## 2020-05-01 NOTE — ED Notes (Signed)
Patient is resting comfortably. 

## 2020-05-01 NOTE — Progress Notes (Signed)
CODE SEPSIS - PHARMACY COMMUNICATION  **Broad Spectrum Antibiotics should be administered within 1 hour of Sepsis diagnosis**  Time Code Sepsis Called/Page Received: 2101  Antibiotics Ordered: ceftriaxone  Time of 1st antibiotic administration: 1820  Additional action taken by pharmacy:   If necessary, Name of Provider/Nurse Contacted:     Thomasene Ripple ,PharmD Clinical Pharmacist  05/01/2020  10:42 PM

## 2020-05-01 NOTE — ED Notes (Signed)
Pt unhooked to go to the bathroom.  

## 2020-05-01 NOTE — ED Triage Notes (Addendum)
Patient c/o abd pain/right flank pain with nausea/vomiting. Reports generalized body aches. Fever 101.4 present in triage. Patient reports she started spotting X2 days ago but it is not time for her period. States having to urinate frequently with little results.

## 2020-05-02 DIAGNOSIS — Z982 Presence of cerebrospinal fluid drainage device: Secondary | ICD-10-CM

## 2020-05-02 DIAGNOSIS — N83201 Unspecified ovarian cyst, right side: Secondary | ICD-10-CM

## 2020-05-02 DIAGNOSIS — R7989 Other specified abnormal findings of blood chemistry: Secondary | ICD-10-CM

## 2020-05-02 DIAGNOSIS — N939 Abnormal uterine and vaginal bleeding, unspecified: Secondary | ICD-10-CM

## 2020-05-02 LAB — CBC
HCT: 35.1 % — ABNORMAL LOW (ref 36.0–46.0)
Hemoglobin: 11.5 g/dL — ABNORMAL LOW (ref 12.0–15.0)
MCH: 29.3 pg (ref 26.0–34.0)
MCHC: 32.8 g/dL (ref 30.0–36.0)
MCV: 89.5 fL (ref 80.0–100.0)
Platelets: 300 10*3/uL (ref 150–400)
RBC: 3.92 MIL/uL (ref 3.87–5.11)
RDW: 12.6 % (ref 11.5–15.5)
WBC: 14.8 10*3/uL — ABNORMAL HIGH (ref 4.0–10.5)
nRBC: 0 % (ref 0.0–0.2)

## 2020-05-02 LAB — PROTIME-INR
INR: 1.1 (ref 0.8–1.2)
Prothrombin Time: 13.9 seconds (ref 11.4–15.2)

## 2020-05-02 LAB — BASIC METABOLIC PANEL
Anion gap: 7 (ref 5–15)
BUN: 8 mg/dL (ref 6–20)
CO2: 26 mmol/L (ref 22–32)
Calcium: 8.3 mg/dL — ABNORMAL LOW (ref 8.9–10.3)
Chloride: 105 mmol/L (ref 98–111)
Creatinine, Ser: 0.68 mg/dL (ref 0.44–1.00)
GFR calc Af Amer: >60 mL/min (ref >60)
GFR calc non Af Amer: >60 mL/min (ref >60)
Glucose, Bld: 113 mg/dL — ABNORMAL HIGH (ref 70–99)
Potassium: 3.9 mmol/L (ref 3.5–5.1)
Sodium: 138 mmol/L (ref 135–145)

## 2020-05-02 LAB — HEPATITIS PANEL, ACUTE
HCV Ab: NONREACTIVE
Hep A IgM: NONREACTIVE
Hep B C IgM: NONREACTIVE
Hepatitis B Surface Ag: NONREACTIVE

## 2020-05-02 LAB — PROCALCITONIN: Procalcitonin: 22.1 ng/mL

## 2020-05-02 LAB — SARS CORONAVIRUS 2 (TAT 6-24 HRS): SARS Coronavirus 2: NEGATIVE

## 2020-05-02 LAB — HIV ANTIBODY (ROUTINE TESTING W REFLEX): HIV Screen 4th Generation wRfx: NONREACTIVE

## 2020-05-02 LAB — CORTISOL-AM, BLOOD: Cortisol - AM: 3.9 ug/dL — ABNORMAL LOW (ref 6.7–22.6)

## 2020-05-02 NOTE — Progress Notes (Signed)
   05/02/20 1300  Clinical Encounter Type  Visited With Patient  Visit Type Initial  Referral From Chaplain  Consult/Referral To Chaplain  Chaplain briefly visited with patient. Patient said that she is feeling much better than yesterday. Chaplain told her that she will check on her tomorrow.

## 2020-05-02 NOTE — Progress Notes (Signed)
Patient alert and oriented x 4,pain is controled at this time, given Toradol, patient reports having improved appetite and eating sandwich, no nausea at this time, explained to patient what tests physician had ordered and reason for IV fluids, answered all questions

## 2020-05-02 NOTE — Progress Notes (Signed)
Patient has stat HIV test that was  ordered in ER, that was not collected, called lab to get test drawn, and was told that it was send out test and they added it to her morning labs, to decrease patient being stuck extra times

## 2020-05-02 NOTE — Progress Notes (Signed)
PROGRESS NOTE    Anna Tapia  KGM:[id number] DOB: 1991-03-26 DOA: 05/01/2020 PCP: Patient, No Pcp Per   Brief Narrative:  HPI On 05/01/2020 By Dr. Lindajo Royal  Anna Tapia is a 29 y.o. 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  No aggravating or alleviating factors.  She denies change in bowel habits.  She has no cough.  Did not detect a fever at home.  No affected contacts.  Denies headache, rash  Assessment & Plan   Sepsis secondary to UTI/suspected acute pyelonephritis -Present on admission, patient presented with fever, tachycardia, leukocytosis, along with suprapubic and right flank pain -UA: Many bacteria,>50 WBC, large leukocytes, positive nitrites, moderate hemoglobin urine dipstick -Reason urine culture pending -Patient placed on IV fluids as well as IV ceftriaxone -CT renal stone study: No renal or ureteral stones.  No hydronephrosis.  VP shunt -Currently stable no acute concerns  Right ovarian cyst -Incidental finding on CT scan -5 cm midline cystic mass in the pelvis, likely of right ovarian origin -Would follow-up with gynecology as an outpatient  Elevated LFTs -AST 61, ALT 68 -Hepatitis panel pending -Continue IV fluids -Continue to monitor   Vaginal spotting -Possibly secondary to UTI -Patient states she had her menstrual cycle earlier this month approximately 2 weeks ago -Hemoglobin has dropped mildly, currently down to 11.5.  However suspect this is dilutional component given IV fluids -Continue to monitor CBC  DVT Prophylaxis Lovenox  Code Status: Full  Family Communication: None at bedside  Disposition Plan:  Status is:  Observation  The patient will require care spanning > 2 midnights and should be moved to inpatient because: IV treatments appropriate due to intensity of illness or inability to take PO and Inpatient level of care appropriate due to severity of illness  Dispo: The patient is from: Home              Anticipated d/c is to: Home              Anticipated d/c date is: 1 day              Patient currently is not medically stable to d/c.  Consultants None  Procedures  None  Antibiotics   Anti-infectives (From admission, onward)   Start     Dose/Rate Route Frequency Ordered Stop   05/02/20 1800  cefTRIAXone (ROCEPHIN) 1 g in sodium chloride 0.9 % 100 mL IVPB     Discontinue     1 g 200 mL/hr over 30 Minutes Intravenous Every 24 hours 05/01/20 2104     05/01/20 1800  cefTRIAXone (ROCEPHIN) 1 g in sodium chloride 0.9 % 100 mL IVPB        1 g 200 mL/hr over 30 Minutes Intravenous  Once 05/01/20 1746 05/01/20 1855      Subjective:   Anna Tapia seen and examined today.  Patient continues to complain of suprapubic pain and urinary frequency along with burning.  She denies current chest pain or shortness of breath, nausea or vomiting, dizziness or headache.  Objective:   Vitals:   05/01/20 2140 05/01/20 2216 05/01/20 2358 05/02/20 0409  BP:  128/74 120/79 139/75  Pulse:  87 94 73  Resp:  17 17   Temp: 98.7 F (37.1 C) 99.8 F (37.7 C) 98.4 F (36.9 C) 98.2 F (36.8 C)  TempSrc: Oral Oral Oral Oral  SpO2:  100% 99% 99%  Weight:  66 kg    Height:  5' (1.524 m)      Intake/Output Summary (Last 24 hours) at 05/02/2020 0913 Last data filed at 05/02/2020 0400 Gross per 24 hour  Intake 297.57 ml  Output 350 ml  Net -52.43 ml   Filed Weights   05/01/20 1534 05/01/20 2216  Weight: 54.4 kg 66 kg    Exam  General: Well developed, well nourished, NAD, appears stated age  HEENT: NCAT, mucous membranes moist.   Cardiovascular: S1 S2 auscultated, RRR, soft  SEM  Respiratory: Clear to auscultation bilaterally with equal chest rise  Abdomen: Soft, suprapubic TTP, nondistended, + bowel sounds  Extremities: warm dry without cyanosis clubbing or edema  Neuro: AAOx3, nonfocal  Psych: Mood and affect, pleasant   Data Reviewed: I have personally reviewed following labs and imaging studies  CBC: Recent Labs  Lab 05/01/20 1544 05/02/20 0541  WBC 20.2* 14.8*  HGB 13.1 11.5*  HCT 37.9 35.1*  MCV 87.1 89.5  PLT 350 300   Basic Metabolic Panel: Recent Labs  Lab 05/01/20 1544 05/02/20 0541  NA 137 138  K 3.6 3.9  CL 102 105  CO2 24 26  GLUCOSE 95 113*  BUN 7 8  CREATININE 0.78 0.68  CALCIUM 9.2 8.3*   GFR: Estimated Creatinine Clearance: 88 mL/min (by C-G formula based on SCr of 0.68 mg/dL). Liver Function Tests: Recent Labs  Lab 05/01/20 1544  AST 61*  ALT 68*  ALKPHOS 68  BILITOT 0.6  PROT 7.9  ALBUMIN 4.3   Recent Labs  Lab 05/01/20 1544  LIPASE 25   No results for input(s): AMMONIA in the last 168 hours. Coagulation Profile: Recent Labs  Lab 05/02/20 0541  INR 1.1   Cardiac Enzymes: No results for input(s): CKTOTAL, CKMB, CKMBINDEX, TROPONINI in the last 168 hours. BNP (last 3 results) No results for input(s): PROBNP in the last 8760 hours. HbA1C: No results for input(s): HGBA1C in the last 72 hours. CBG: No results for input(s): GLUCAP in the last 168 hours. Lipid Profile: No results for input(s): CHOL, HDL, LDLCALC, TRIG, CHOLHDL, LDLDIRECT in the last 72 hours. Thyroid Function Tests: No results for input(s): TSH, T4TOTAL, FREET4, T3FREE, THYROIDAB in the last 72 hours. Anemia Panel: No results for input(s): VITAMINB12, FOLATE, FERRITIN, TIBC, IRON, RETICCTPCT in the last 72 hours. Urine analysis:    Component Value Date/Time   COLORURINE YELLOW (A) 05/01/2020 1544   APPEARANCEUR CLOUDY (A) 05/01/2020 1544   LABSPEC 1.012 05/01/2020 1544   PHURINE 6.0 05/01/2020 1544   GLUCOSEU NEGATIVE  05/01/2020 1544   HGBUR MODERATE (A) 05/01/2020 1544   BILIRUBINUR NEGATIVE 05/01/2020 1544   KETONESUR NEGATIVE 05/01/2020 1544   PROTEINUR 100 (A) 05/01/2020 1544   NITRITE POSITIVE (A) 05/01/2020 1544   LEUKOCYTESUR LARGE (A) 05/01/2020 1544   Sepsis Labs: @LABRCNTIP (procalcitonin:4,lacticidven:4)  ) Recent Results (from the past 240 hour(s))  Culture, blood (routine x 2)     Status: None (Preliminary result)   Collection Time: 05/01/20  6:10 PM   Specimen: BLOOD  Result Value Ref Range Status   Specimen Description BLOOD LEFT ANTECUBITAL  Final   Special Requests   Final    BOTTLES DRAWN AEROBIC AND ANAEROBIC Blood Culture adequate volume   Culture   Final    NO  GROWTH < 12 HOURS Performed at [facility], [address] [place], [place] [address]    Report Status PENDING  Incomplete  Culture, blood (routine x 2)     Status: None (Preliminary result)   Collection Time: 05/01/20  6:10 PM   Specimen: BLOOD  Result Value Ref Range Status   Specimen Description BLOOD BLOOD LEFT ARM  Final   Special Requests   Final    BOTTLES DRAWN AEROBIC AND ANAEROBIC Blood Culture adequate volume   Culture   Final    NO GROWTH < 12 HOURS Performed at [facility], [address], [place], [place] [address]    Report Status PENDING  Incomplete      Radiology Studies: CT Renal Stone Study  Result Date: 05/01/2020 CLINICAL DATA:  Right flank pain, nausea, vomiting EXAM: CT ABDOMEN AND PELVIS WITHOUT CONTRAST TECHNIQUE: Multidetector CT imaging of the abdomen and pelvis was performed following the standard protocol without IV contrast. COMPARISON:  None. FINDINGS: Lower chest: No acute abnormality. Hepatobiliary: No focal hepatic abnormality. Gallbladder unremarkable. Pancreas: No focal abnormality or ductal dilatation. Spleen: No focal abnormality.  Normal size. Adrenals/Urinary Tract: No adrenal abnormality. No focal renal abnormality. No stones or hydronephrosis.  Urinary bladder is unremarkable. Stomach/Bowel: Normal appendix. Stomach, large and small bowel grossly unremarkable. Vascular/Lymphatic: No evidence of aneurysm or adenopathy. Reproductive: 5 cm cystic areas seen in the midline of the pelvis superior to the urinary bladder, this likely arises from the right ovary. Uterus and left adnexa unremarkable. Other: No free fluid or free air. Musculoskeletal: No acute bony abnormality. IMPRESSION: No renal or ureteral stones.  No hydronephrosis. 5 cm midline cystic mass in the pelvis, likely of right ovarian origin. Normal appendix. Electronically Signed   By: Charlett Nose M.D.   On: 05/01/2020 18:45     Scheduled Meds: . enoxaparin (LOVENOX) injection  40 mg Subcutaneous Q24H   Continuous Infusions: . sodium chloride 100 mL/hr at 05/02/20 0400  . cefTRIAXone (ROCEPHIN)  IV       LOS: 0 days   Time Spent in minutes   45 minutes  Cortez Steelman D.O. on 05/02/2020 at 9:13 AM  Between 7am to 7pm - Please see pager noted on [email or web address]  After 7pm go to [email or web address]  And look for the night coverage person covering for me after hours  [facility] Office  [phone]

## 2020-05-02 NOTE — Plan of Care (Signed)
  Problem: Nutrition Goal: Patient maintains adequate hydration Outcome: Progressing Goal: Patient maintains weight Outcome: Progressing Goal: Patient/Family demonstrates understanding of diet Outcome: Progressing Goal: Patient/Family independently completes tube feeding Outcome: Progressing Goal: Patient will have no more than 5 lb weight change during LOS Outcome: Progressing Goal: Patient will utilize adaptive techniques to administer nutrition Outcome: Progressing Goal: Patient will verbalize dietary restrictions Outcome: Progressing   

## 2020-05-03 DIAGNOSIS — N10 Acute tubulo-interstitial nephritis: Secondary | ICD-10-CM

## 2020-05-03 LAB — PROCALCITONIN: Procalcitonin: 14.92 ng/mL

## 2020-05-03 LAB — COMPREHENSIVE METABOLIC PANEL
ALT: 56 U/L — ABNORMAL HIGH (ref 0–44)
AST: 45 U/L — ABNORMAL HIGH (ref 15–41)
Albumin: 3.5 g/dL (ref 3.5–5.0)
Alkaline Phosphatase: 76 U/L (ref 38–126)
Anion gap: 7 (ref 5–15)
BUN: 7 mg/dL (ref 6–20)
CO2: 25 mmol/L (ref 22–32)
Calcium: 8.2 mg/dL — ABNORMAL LOW (ref 8.9–10.3)
Chloride: 104 mmol/L (ref 98–111)
Creatinine, Ser: 0.71 mg/dL (ref 0.44–1.00)
GFR calc Af Amer: >60 mL/min (ref >60)
GFR calc non Af Amer: >60 mL/min (ref >60)
Glucose, Bld: 93 mg/dL (ref 70–99)
Potassium: 3.7 mmol/L (ref 3.5–5.1)
Sodium: 136 mmol/L (ref 135–145)
Total Bilirubin: 0.6 mg/dL (ref 0.3–1.2)
Total Protein: 7.1 g/dL (ref 6.5–8.1)

## 2020-05-03 LAB — CBC
HCT: 35 % — ABNORMAL LOW (ref 36.0–46.0)
Hemoglobin: 11.7 g/dL — ABNORMAL LOW (ref 12.0–15.0)
MCH: 29.7 pg (ref 26.0–34.0)
MCHC: 33.4 g/dL (ref 30.0–36.0)
MCV: 88.8 fL (ref 80.0–100.0)
Platelets: 298 10*3/uL (ref 150–400)
RBC: 3.94 MIL/uL (ref 3.87–5.11)
RDW: 12.3 % (ref 11.5–15.5)
WBC: 11.3 10*3/uL — ABNORMAL HIGH (ref 4.0–10.5)
nRBC: 0 % (ref 0.0–0.2)

## 2020-05-03 NOTE — Progress Notes (Signed)
PROGRESS NOTE    Anna Tapia  PNT:[id number] DOB: 31-Oct-1991 DOA: 05/01/2020 PCP: Patient, No Pcp Per   Chief complaint.  Right flank pain. Brief Narrative:  Anna Tapia is a 29 y.o. 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  No aggravating or alleviating factors.  She denies change in bowel habits.  She has no cough.  Did not detect a fever at home.  No affected contacts.  Denies headache, rash ED Course: On arrival in the ER, she was febrile at 101.4 and tachycardic at 115 with otherwise normal vitals.  Blood work significant for leukocytosis of 20,000, with lactic acid 1.1.  Urinalysis strongly consistent with UTI.  CT abdomen and pelvis was negative for stone but showed an incidental finding of a 5 cm midline cystic mass on the right ovary.  Patient was given sepsis fluid bolus, started on Rocephin.  Hospitalist consulted for admission.  6/30.  Patient feels much better today.  Flank pain seem to be resolved.  Still has significant elevation of procalcitonin level.  Urine culture still pending.  Assessment & Plan:   Principal Problem:   Sepsis secondary to UTI [facility]) Active Problems:   VP (ventriculoperitoneal) shunt status   Right ovarian cyst   Elevated LFTs   Vaginal spotting  #1.  Sepsis secondary to E. coli pyelonephritis. Condition has improved.  Discontinue IV fluids.  Continue antibiotics.  2.  Acute pyelonephritis secondary to E. coli. Patient had a significant right flank pain, which has resolved totally today.  Patient might have passed a kidney stone.  Due to profound elevation of procalcitonin level, I will continue IV Rocephin for another  day.  Check procalcitonin level tomorrow.  3.  Vaginal spotting. Patient can follow-up with OB/GYN as outpatient.   DVT prophylaxis: Lovenox Code Status: Full Family Communication: Boyfriend at bedside Disposition Plan:  . Patient came from: Home            . Anticipated d/c place: Home . Barriers to d/c OR conditions which need to be met to effect a safe d/c:   Consultants:   None  Procedures: None. Antimicrobials: Rocephin.  Subjective: Patient condition much improved today.  Right flank pain is resolved.  No nausea vomiting.  No fever or chills.  She still has some headaches.  Objective: Vitals:   05/02/20 2130 05/03/20 0529 05/03/20 1207 05/03/20 1218  BP: 134/86 132/75 (!) 150/91 (!) 142/80  Pulse: 78 72 70   Resp: _0 Temp: 99.1 F (37.3 C) 98.8 F (37.1 C) 98.9 F (37.2 C)   TempSrc: Oral Oral Oral   SpO2: 99% 96% 100%   Weight:      Height:        Intake/Output Summary (Last 24 hours) at 05/03/2020 1521 Last data filed at 05/03/2020 0529 Gross per 24 hour  Intake 2893.24 ml  Output 1400 ml  Net 1493.24 ml   Filed Weights   05/01/20 1534 05/01/20 2216  Weight: 54.4 kg 66 kg    Examination:  General exam: Appears calm and comfortable  Respiratory system: Clear to auscultation. Respiratory effort normal. Cardiovascular system: S1 & S2 heard, RRR. No JVD, murmurs, rubs, gallops or clicks. No pedal  edema. Gastrointestinal system: Abdomen is nondistended, soft and nontender. No organomegaly or masses felt. Normal bowel sounds heard. Central nervous system: Alert and oriented. No focal neurological deficits. Extremities: Symmetric 5 x 5 power. Skin: No rashes, lesions or ulcers Psychiatry: Judgement and insight appear normal. Mood & affect appropriate.     Data Reviewed: I have personally reviewed following labs and imaging studies  CBC: Recent Labs  Lab 05/01/20 1544 05/02/20 0541 05/03/20 0600  WBC 20.2* 14.8* 11.3*  HGB 13.1 11.5*  11.7*  HCT 37.9 35.1* 35.0*  MCV 87.1 89.5 88.8  PLT 350 300 665   Basic Metabolic Panel: Recent Labs  Lab 05/01/20 1544 05/02/20 0541 05/03/20 0600  NA 137 138 136  K 3.6 3.9 3.7  CL 102 105 104  CO2 _0 GLUCOSE 95 113* 93  BUN _1 CREATININE 0.78 0.68 0.71  CALCIUM 9.2 8.3* 8.2*   GFR: Estimated Creatinine Clearance: 88 mL/min (by C-G formula based on SCr of 0.71 mg/dL). Liver Function Tests: Recent Labs  Lab 05/01/20 1544 05/03/20 0600  AST 61* 45*  ALT 68* 56*  ALKPHOS 68 76  BILITOT 0.6 0.6  PROT 7.9 7.1  ALBUMIN 4.3 3.5   Recent Labs  Lab 05/01/20 1544  LIPASE 25   No results for input(s): AMMONIA in the last 168 hours. Coagulation Profile: Recent Labs  Lab 05/02/20 0541  INR 1.1   Cardiac Enzymes: No results for input(s): CKTOTAL, CKMB, CKMBINDEX, TROPONINI in the last 168 hours. BNP (last 3 results) No results for input(s): PROBNP in the last 8760 hours. HbA1C: No results for input(s): HGBA1C in the last 72 hours. CBG: No results for input(s): GLUCAP in the last 168 hours. Lipid Profile: No results for input(s): CHOL, HDL, LDLCALC, TRIG, CHOLHDL, LDLDIRECT in the last 72 hours. Thyroid Function Tests: No results for input(s): TSH, T4TOTAL, FREET4, T3FREE, THYROIDAB in the last 72 hours. Anemia Panel: No results for input(s): VITAMINB12, FOLATE, FERRITIN, TIBC, IRON, RETICCTPCT in the last 72 hours. Sepsis Labs: Recent Labs  Lab 05/01/20 1810 05/02/20 0541 05/03/20 0600  PROCALCITON  --  22.10 14.92  LATICACIDVEN 1.1  --   --     Recent Results (from the past 240 hour(s))  Urine culture     Status: Abnormal (Preliminary result)   Collection Time: 05/01/20  3:44 PM   Specimen: Urine, Random  Result Value Ref Range Status   Specimen Description   Final    URINE, RANDOM Performed at [facility], [address], [place], [place] [address]    Special Requests   Final    NONE Performed at [facility], [address] [place], [place] [address]    Culture (A)  Final    >=100,000 COLONIES/mL ESCHERICHIA COLI SUSCEPTIBILITIES TO FOLLOW Performed at [facility], [place] [address], [place], [place] [address]    Report Status PENDING  Incomplete  Culture, blood (routine x 2)     Status: None (Preliminary result)   Collection Time: 05/01/20  6:10 PM   Specimen: BLOOD  Result Value Ref Range Status   Specimen Description BLOOD LEFT ANTECUBITAL  Final   Special Requests   Final    BOTTLES DRAWN AEROBIC AND ANAEROBIC Blood Culture adequate volume   Culture   Final    NO GROWTH 2 DAYS Performed at [facility], [address] [place], [place] [address]    Report Status PENDING  Incomplete  Culture, blood (routine  x 2)     Status: None (Preliminary result)   Collection Time: 05/01/20  6:10 PM   Specimen: BLOOD  Result Value Ref Range Status   Specimen Description BLOOD BLOOD LEFT ARM  Final   Special Requests   Final    BOTTLES DRAWN AEROBIC AND ANAEROBIC Blood Culture adequate volume   Culture   Final    NO GROWTH 2 DAYS Performed at [facility], [place], [place] [address]    Report Status PENDING  Incomplete  SARS CORONAVIRUS 2 (TAT 6-24 HRS) Nasopharyngeal Nasopharyngeal Swab     Status: None   Collection Time: 05/01/20  8:36 PM   Specimen: Nasopharyngeal Swab  Result Value Ref Range Status   SARS Coronavirus 2 NEGATIVE NEGATIVE Final    Comment: (NOTE) SARS-CoV-2 target nucleic acids are NOT DETECTED.  The SARS-CoV-2 RNA is generally detectable in upper and lower respiratory specimens during the acute phase of infection. Negative results do not preclude SARS-CoV-2 infection, do not rule out co-infections with other pathogens, and should not be used as the sole basis for treatment or other patient management decisions. Negative results must be combined with clinical observations, patient history, and epidemiological information. The  expected result is Negative.  Fact Sheet for Patients: SugarRoll.be  Fact Sheet for Healthcare Providers: https://www.woods-mathews.com/  This test is not yet approved or cleared by the [place] FDA and  has been authorized for detection and/or diagnosis of SARS-CoV-2 by FDA under an Emergency Use Authorization (EUA). This EUA will remain  in effect (meaning this test can be used) for the duration of the COVID-19 declaration under Se ction 564(b)(1) of the Act, 21 U.S.C. section 360bbb-3(b)(1), unless the authorization is terminated or revoked sooner.  Performed at [facility], [place] [address] [place], [place] [address]          Radiology Studies: CT Renal Stone Study  Result Date: 05/01/2020 CLINICAL DATA:  Right flank pain, nausea, vomiting EXAM: CT ABDOMEN AND PELVIS WITHOUT CONTRAST TECHNIQUE: Multidetector CT imaging of the abdomen and pelvis was performed following the standard protocol without IV contrast. COMPARISON:  None. FINDINGS: Lower chest: No acute abnormality. Hepatobiliary: No focal hepatic abnormality. Gallbladder unremarkable. Pancreas: No focal abnormality or ductal dilatation. Spleen: No focal abnormality.  Normal size. Adrenals/Urinary Tract: No adrenal abnormality. No focal renal abnormality. No stones or hydronephrosis. Urinary bladder is unremarkable. Stomach/Bowel: Normal appendix. Stomach, large and small bowel grossly unremarkable. Vascular/Lymphatic: No evidence of aneurysm or adenopathy. Reproductive: 5 cm cystic areas seen in the midline of the pelvis superior to the urinary bladder, this likely arises from the right ovary. Uterus and left adnexa unremarkable. Other: No free fluid or free air. Musculoskeletal: No acute bony abnormality. IMPRESSION: No renal or ureteral stones.  No hydronephrosis. 5 cm midline cystic mass in the pelvis, likely of right ovarian origin. Normal appendix. Electronically Signed    By: Rolm Baptise M.D.   On: 05/01/2020 18:45        Scheduled Meds: . enoxaparin (LOVENOX) injection  40 mg Subcutaneous Q24H   Continuous Infusions: . cefTRIAXone (ROCEPHIN)  IV Stopped (05/02/20 1851)     LOS: 1 day    Time spent: 28 minutes    Sharen Hones, MD [facility]   To contact the attending provider between 7A-7P or the covering provider during after hours 7P-7A, please log into the web site [email or web address] and access using universal [place] password for that web site. If you do not  have the password, please call the hospital operator.  05/03/2020, 3:21 PM

## 2020-05-04 ENCOUNTER — Telehealth: Payer: Self-pay | Admitting: Gerontology

## 2020-05-04 LAB — CBC WITH DIFFERENTIAL/PLATELET
Abs Immature Granulocytes: 0.02 10*3/uL (ref 0.00–0.07)
Basophils Absolute: 0.1 10*3/uL (ref 0.0–0.1)
Basophils Relative: 1 %
Eosinophils Absolute: 0.3 10*3/uL (ref 0.0–0.5)
Eosinophils Relative: 3 %
HCT: 34.8 % — ABNORMAL LOW (ref 36.0–46.0)
Hemoglobin: 12 g/dL (ref 12.0–15.0)
Immature Granulocytes: 0 %
Lymphocytes Relative: 30 %
Lymphs Abs: 2.8 10*3/uL (ref 0.7–4.0)
MCH: 29.6 pg (ref 26.0–34.0)
MCHC: 34.5 g/dL (ref 30.0–36.0)
MCV: 85.7 fL (ref 80.0–100.0)
Monocytes Absolute: 1.2 10*3/uL — ABNORMAL HIGH (ref 0.1–1.0)
Monocytes Relative: 13 %
Neutro Abs: 4.9 10*3/uL (ref 1.7–7.7)
Neutrophils Relative %: 53 %
Platelets: 321 10*3/uL (ref 150–400)
RBC: 4.06 MIL/uL (ref 3.87–5.11)
RDW: 12.1 % (ref 11.5–15.5)
WBC: 9.2 10*3/uL (ref 4.0–10.5)
nRBC: 0 % (ref 0.0–0.2)

## 2020-05-04 LAB — BASIC METABOLIC PANEL
Anion gap: 9 (ref 5–15)
BUN: 6 mg/dL (ref 6–20)
CO2: 27 mmol/L (ref 22–32)
Calcium: 8.4 mg/dL — ABNORMAL LOW (ref 8.9–10.3)
Chloride: 102 mmol/L (ref 98–111)
Creatinine, Ser: 0.8 mg/dL (ref 0.44–1.00)
GFR calc Af Amer: >60 mL/min (ref >60)
GFR calc non Af Amer: >60 mL/min (ref >60)
Glucose, Bld: 85 mg/dL (ref 70–99)
Potassium: 3.8 mmol/L (ref 3.5–5.1)
Sodium: 138 mmol/L (ref 135–145)

## 2020-05-04 LAB — URINE CULTURE: Culture: >=100000 — AB

## 2020-05-04 LAB — PROCALCITONIN: Procalcitonin: 8.18 ng/mL

## 2020-05-04 MED ORDER — AMOXICILLIN-POT CLAVULANATE 875-125 MG PO TABS
1.0000 | ORAL_TABLET | Freq: Two times a day (BID) | ORAL | 0 refills | Status: AC
Start: 2020-05-04 — End: 2020-05-12

## 2020-05-04 MED ORDER — AMOXICILLIN-POT CLAVULANATE 875-125 MG PO TABS
1.0000 | ORAL_TABLET | Freq: Two times a day (BID) | ORAL | 0 refills | Status: DC
Start: 2020-05-04 — End: 2020-05-04

## 2020-05-04 NOTE — Discharge Instructions (Signed)
Follow-up with PCP in 1 week.  Social worker will help set up. Please follow-up with OB/GYN in the next month.

## 2020-05-04 NOTE — Care Management (Signed)
Patient to discharge today.  Uninsured.  Reviewed options for free and low cost PCP.  Patient would like application for [facility] .  Patient provided application to [facility]  And Medication Management .  RNCM notified [facility]  That patient would be submitting application  Patient provided good rx coupons prior to discharge.  She confirms she will be able to afford her medications at discharge

## 2020-05-04 NOTE — Discharge Summary (Signed)
Physician Discharge Summary  Patient ID: Anna Tapia MRN: [id number] DOB/AGE: 11/29/1990 29 y.o.  Admit date: 05/01/2020 Discharge date: 05/04/2020  Admission Diagnoses:  Discharge Diagnoses:  Principal Problem:   Sepsis secondary to UTI [facility]) Active Problems:   VP (ventriculoperitoneal) shunt status   Right ovarian cyst   Elevated LFTs   Vaginal spotting   Discharged Condition: good  Hospital Course: Anna L Holmesis a 29 y.o.femalewith medical history significant forhistory of VP shunt as a child, and questionable childhood history of CHF, lost to follow-up to cardiology, who presents with 1 day history of severe, colicky right flank pain radiating to the groin, associated with nausea and one episode of nonbloody nonbilious vomiting, body aches, frequent urination with spotting. No aggravating or alleviating factors. She denies change in bowel habits. She has no cough. Did not detect a fever at home. No affected contacts. Denies headache, rash ED Course:On arrival in the ER, she was febrile at 101.4 and tachycardic at 115 with otherwise normal vitals. Blood work significant for leukocytosis of 20,000, with lactic acid 1.1. Urinalysis strongly consistent with UTI. CT abdomen and pelvis was negative for stone but showed an incidental finding of a 5 cm midline cystic mass on the right ovary. Patient was given sepsis fluid bolus, started on Rocephin. Hospitalist consulted for admission.  6/30.  Patient feels much better today.  Flank pain seem to be resolved.  Still has significant elevation of procalcitonin level.  Urine culture still pending.  7/1.  Urine culture came back with E. coli pansensitive.  We will treat with oral Augmentin for total course of 8 more days.  #1.  Sepsis secondary to E. coli pyelonephritis. Condition has improved.    2.  Acute pyelonephritis secondary to E. coli. Patient had a significant right flank pain, which has resolved  totally.  Patient might have passed a kidney stone.  Due to profound elevation of procalcitonin level, she was kept in the hospital with IV antibiotics.  Condition had improved today.  Urine culture grow pansensitive E. coli.  Continue Augmentin for additional 8 days.  3.  Vaginal spotting. Patient can follow-up with OB/GYN as outpatient.  Consults: None  Significant Diagnostic Studies:  Multidetector CT imaging of the abdomen and pelvis was performed following the standard protocol without IV contrast.  COMPARISON:  None.  FINDINGS: Lower chest: No acute abnormality.  Hepatobiliary: No focal hepatic abnormality. Gallbladder unremarkable.  Pancreas: No focal abnormality or ductal dilatation.  Spleen: No focal abnormality.  Normal size.  Adrenals/Urinary Tract: No adrenal abnormality. No focal renal abnormality. No stones or hydronephrosis. Urinary bladder is unremarkable.  Stomach/Bowel: Normal appendix. Stomach, large and small bowel grossly unremarkable.  Vascular/Lymphatic: No evidence of aneurysm or adenopathy.  Reproductive: 5 cm cystic areas seen in the midline of the pelvis superior to the urinary bladder, this likely arises from the right ovary. Uterus and left adnexa unremarkable.  Other: No free fluid or free air.  Musculoskeletal: No acute bony abnormality.  IMPRESSION: No renal or ureteral stones.  No hydronephrosis.  5 cm midline cystic mass in the pelvis, likely of right ovarian origin.  Normal appendix.   Electronically Signed   By: Charlett Nose M.D.   On: 05/01/2020 18:45    Treatments: Antibiotics with Rocephin.  Discharge Exam: Blood pressure 135/77, pulse 63, temperature 98.6 F (37 C), temperature source Oral, resp. rate 16, height 5' (1.524 m), weight 66 kg, last menstrual period 04/19/2020, SpO2 100 %. General appearance: alert and cooperative Resp:  clear to auscultation bilaterally Cardio: regular rate and rhythm,  S1, S2 normal, no murmur, click, rub or gallop GI: soft, non-tender; bowel sounds normal; no masses,  no organomegaly Extremities: extremities normal, atraumatic, no cyanosis or edema  Disposition: Discharge disposition: 01-Home or Self Care       Discharge Instructions    Diet - low sodium heart healthy   Complete by: As directed    Increase activity slowly   Complete by: As directed      Allergies as of 05/04/2020   No Known Allergies     Medication List    TAKE these medications   acetaminophen 325 MG tablet Commonly known as: TYLENOL Take 650 mg by mouth every 6 (six) hours as needed.   amoxicillin-clavulanate 875-125 MG tablet Commonly known as: Augmentin Take 1 tablet by mouth 2 (two) times daily for 8 days.   dibucaine 1 % Oint Commonly known as: NUPERCAINAL Place 1 application rectally 3 (three) times daily as needed.   ondansetron 4 MG disintegrating tablet Commonly known as: Zofran ODT Take 1 tablet (4 mg total) by mouth every 8 (eight) hours as needed.        Signed: Marrion Coy 05/04/2020, 12:59 PM

## 2020-05-04 NOTE — Progress Notes (Signed)
Patient ready for discharge, no complaints of pain at this time. IV removed. AVS given to patient along with goodrx coupon and [facility] application. Patient requests wheelchair, this writer called volunteers to pick her up, she states her ride will be out front. No questions comments or concerns, and verbalizes understanding via education and packet.

## 2020-05-04 NOTE — Plan of Care (Signed)
  Problem: Nutrition Goal: Patient maintains adequate hydration Outcome: Progressing Goal: Patient maintains weight Outcome: Progressing Goal: Patient/Family demonstrates understanding of diet Outcome: Progressing Goal: Patient/Family independently completes tube feeding Outcome: Progressing Goal: Patient will have no more than 5 lb weight change during LOS Outcome: Progressing Goal: Patient will utilize adaptive techniques to administer nutrition Outcome: Progressing Goal: Patient will verbalize dietary restrictions Outcome: Progressing   

## 2020-05-06 ENCOUNTER — Emergency Department: Payer: Medicaid Other

## 2020-05-06 ENCOUNTER — Emergency Department
Admission: EM | Admit: 2020-05-06 | Discharge: 2020-05-06 | Disposition: A | Discharge status: Home / Self Care | Payer: Medicaid Other | Attending: Emergency Medicine | Admitting: Emergency Medicine

## 2020-05-06 ENCOUNTER — Other Ambulatory Visit: Payer: Self-pay

## 2020-05-06 DIAGNOSIS — F17210 Nicotine dependence, cigarettes, uncomplicated: Secondary | ICD-10-CM | POA: Insufficient documentation

## 2020-05-06 DIAGNOSIS — G43819 Other migraine, intractable, without status migrainosus: Secondary | ICD-10-CM

## 2020-05-06 DIAGNOSIS — J45909 Unspecified asthma, uncomplicated: Secondary | ICD-10-CM | POA: Insufficient documentation

## 2020-05-06 DIAGNOSIS — I509 Heart failure, unspecified: Secondary | ICD-10-CM | POA: Insufficient documentation

## 2020-05-06 DIAGNOSIS — T85618A Breakdown (mechanical) of other specified internal prosthetic devices, implants and grafts, initial encounter: Secondary | ICD-10-CM

## 2020-05-06 LAB — COMPREHENSIVE METABOLIC PANEL
ALT: 34 U/L (ref 0–44)
AST: 32 U/L (ref 15–41)
Albumin: 3.9 g/dL (ref 3.5–5.0)
Alkaline Phosphatase: 72 U/L (ref 38–126)
Anion gap: 10 (ref 5–15)
BUN: 9 mg/dL (ref 6–20)
CO2: 25 mmol/L (ref 22–32)
Calcium: 8.8 mg/dL — ABNORMAL LOW (ref 8.9–10.3)
Chloride: 103 mmol/L (ref 98–111)
Creatinine, Ser: 0.8 mg/dL (ref 0.44–1.00)
GFR calc Af Amer: >60 mL/min (ref >60)
GFR calc non Af Amer: >60 mL/min (ref >60)
Glucose, Bld: 142 mg/dL — ABNORMAL HIGH (ref 70–99)
Potassium: 3.4 mmol/L — ABNORMAL LOW (ref 3.5–5.1)
Sodium: 138 mmol/L (ref 135–145)
Total Bilirubin: 0.4 mg/dL (ref 0.3–1.2)
Total Protein: 7.9 g/dL (ref 6.5–8.1)

## 2020-05-06 LAB — CBC
HCT: 37.5 % (ref 36.0–46.0)
Hemoglobin: 12.9 g/dL (ref 12.0–15.0)
MCH: 29.7 pg (ref 26.0–34.0)
MCHC: 34.4 g/dL (ref 30.0–36.0)
MCV: 86.2 fL (ref 80.0–100.0)
Platelets: 387 10*3/uL (ref 150–400)
RBC: 4.35 MIL/uL (ref 3.87–5.11)
RDW: 12.4 % (ref 11.5–15.5)
WBC: 7.8 10*3/uL (ref 4.0–10.5)
nRBC: 0 % (ref 0.0–0.2)

## 2020-05-06 LAB — CULTURE, BLOOD (ROUTINE X 2)
Culture: NO GROWTH
Culture: NO GROWTH
Special Requests: ADEQUATE
Special Requests: ADEQUATE

## 2020-05-06 LAB — POCT PREGNANCY, URINE: Preg Test, Ur: NEGATIVE

## 2020-05-06 MED ORDER — PROCHLORPERAZINE EDISYLATE 10 MG/2ML IJ SOLN
10.0000 mg | Freq: Once | INTRAMUSCULAR | Status: AC
  Administered 2020-05-06: 10 mg via INTRAVENOUS
  Filled 2020-05-06: qty 2

## 2020-05-06 MED ORDER — IOHEXOL 350 MG/ML SOLN
75.0000 mL | Freq: Once | INTRAVENOUS | Status: AC | PRN
  Administered 2020-05-06: 75 mL via INTRAVENOUS

## 2020-05-06 MED ORDER — DEXAMETHASONE SODIUM PHOSPHATE 10 MG/ML IJ SOLN
10.0000 mg | Freq: Once | INTRAMUSCULAR | Status: DC
  Filled 2020-05-06: qty 1

## 2020-05-06 MED ORDER — DIPHENHYDRAMINE HCL 50 MG/ML IJ SOLN
12.5000 mg | Freq: Once | INTRAMUSCULAR | Status: AC
  Administered 2020-05-06: 12.5 mg via INTRAVENOUS
  Filled 2020-05-06: qty 1

## 2020-05-06 MED ORDER — HYDROMORPHONE HCL 1 MG/ML IJ SOLN: 0.5000 mg | Freq: Once | INTRAMUSCULAR | Status: DC

## 2020-05-06 MED ORDER — HALOPERIDOL LACTATE 5 MG/ML IJ SOLN
2.0000 mg | Freq: Once | INTRAMUSCULAR | Status: DC
  Filled 2020-05-06: qty 1

## 2020-05-06 MED ORDER — SODIUM CHLORIDE 0.9 % IV BOLUS
1000.0000 mL | Freq: Once | INTRAVENOUS | Status: AC
  Administered 2020-05-06: 1000 mL via INTRAVENOUS

## 2020-05-06 MED ORDER — MAGNESIUM SULFATE IN D5W 1-5 GM/100ML-% IV SOLN
1.0000 g | Freq: Once | INTRAVENOUS | Status: DC
  Filled 2020-05-06: qty 100

## 2020-05-06 NOTE — ED Notes (Signed)
Pt stating she wants to leave AMA. Pt stating she does not want anymore medication either and just wants to go home. MD made aware.

## 2020-05-06 NOTE — ED Notes (Signed)
MD at bedside. 

## 2020-05-06 NOTE — ED Provider Notes (Signed)
Told by nursing staff that patient wanted to leave. Discussed with patient that CT scans had not yet resulted. Did discuss concern for bleeding. However patient stated she did not want to wait for the results to come back.    Phineas Semen, MD 05/06/20 [phone]

## 2020-05-06 NOTE — ED Provider Notes (Signed)
[facility] Emergency Department Provider Note  ____________________________________________   First MD Initiated Contact with Patient 05/06/20 1344     (approximate)  I have reviewed the triage vital signs and the nursing notes.   HISTORY  Chief Complaint Migraine    HPI NISHKA HEIDE is a 29 y.o. female with history of CHF, brain aneurysm who comes in with headache.  Patient was recently admitted for sepsis secondary to pyelonephritis E. Coli.  Patient reports upon discharge she has started have a little bit of a headache but is been gradual getting worse over the past 2 days.  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  She also has a VP shunt still in place per patient however she is not sure about this..  She denies any recurrent fevers, neck stiffness.  She just reports the headache is over her entire head, constant, not better with Tylenol, nothing makes it worse.  She states that she is had headaches previously and this feels similar but she is just getting tired of constantly having coming to the hospital.  She reports some depression associated with this but she is able to contract for safety stating that she did not have any SI.  She is not on any medications and does not have a therapist that she sees          Past Medical History:  Diagnosis Date  . Asthma   . Brain aneurysm   . CHF (congestive heart failure) [facility])     Patient Active Problem List   Diagnosis Date Noted  . Sepsis secondary to UTI (HCC) 05/01/2020  . 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 Right ovarian cyst 05/01/2020  . Elevated LFTs 05/01/2020  . Vaginal spotting 05/01/2020    Past Surgical History:  Procedure Laterality Date  . VENTRICULOPERITONEAL SHUNT      Prior to Admission medications   Medication Sig Start Date End Date Taking? Authorizing Provider  acetaminophen (TYLENOL) 325 MG tablet Take 650 mg by  mouth every 6 (six) hours as needed.    [provider]  amoxicillin-clavulanate (AUGMENTIN) 875-125 MG tablet Take 1 tablet by mouth 2 (two) times daily for 8 days. 05/04/20 05/12/20  Marrion Coy, MD  dibucaine (NUPERCAINAL) 1 % OINT Place 1 application rectally 3 (three) times daily as needed. 03/04/20   Menshew, Charlesetta Ivory, PA-C  ondansetron (ZOFRAN ODT) 4 MG disintegrating tablet Take 1 tablet (4 mg total) by mouth every 8 (eight) hours as needed. 03/04/20   Menshew, Charlesetta Ivory, PA-C    Allergies Patient has no known allergies.  History reviewed. No pertinent family history.  Social History Social History   Tobacco Use  . Smoking status: Current Every Day Smoker    Types: Cigarettes  . Smokeless tobacco: Never Used  Substance Use Topics  . Alcohol use: Yes    Comment: Rare  . Drug use: No      Review of Systems Constitutional: No fever/chills Eyes: No visual changes. ENT: No sore throat. Cardiovascular: Denies chest pain. Respiratory: Denies shortness of breath. Gastrointestinal: No abdominal pain.  No nausea, no vomiting.  No diarrhea.  No constipation. Genitourinary: Negative for dysuria. Musculoskeletal: Negative for back pain. Skin: Negative for rash. Neurological: Positive headache, no focal weakness or numbness. Psych: Some depression related to being in the hospital recently but denies any SI All other ROS negative ____________________________________________   PHYSICAL EXAM:  VITAL SIGNS: ED Triage  Vitals  Enc Vitals Group     BP 05/06/20 1127 (!) 149/99     Pulse Rate 05/06/20 1127 78     Resp 05/06/20 1127 18     Temp 05/06/20 1127 98.1 F (36.7 C)     Temp Source 05/06/20 1127 Oral     SpO2 05/06/20 1127 99 %     Weight 05/06/20 1128 120 lb (54.4 kg)     Height 05/06/20 1128 5' (1.524 m)     Head Circumference --      Peak Flow --      Pain Score 05/06/20 1127 10     Pain Loc --      Pain Edu? --      Excl. in GC? --      Constitutional: Alert and oriented. Well appearing and in no acute distress. Eyes: Conjunctivae are normal. EOMI. Head: Atraumatic.  No palpable shunt Nose: No congestion/rhinnorhea. Mouth/Throat: Mucous membranes are moist.   Neck: No stridor. Trachea Midline. FROM Cardiovascular: Normal rate, regular rhythm. Grossly normal heart sounds.  Good peripheral circulation. Respiratory: Normal respiratory effort.  No retractions. Lungs CTAB. Gastrointestinal: Soft and nontender. No distention. No abdominal bruits.  Musculoskeletal: No lower extremity tenderness nor edema.  No joint effusions. Neurologic:  Normal speech and language. No gross focal neurologic deficits are appreciated.  Cranial nerves II to XII are intact.  Denies any difficulties with walking.  Equal strength in arms and legs. Skin:  Skin is warm, dry and intact. No rash noted. Psychiatric: Mood and affect are normal. Speech and behavior are normal. GU: Deferred   ____________________________________________   LABS (all labs ordered are listed, but only abnormal results are displayed)  Labs Reviewed  COMPREHENSIVE METABOLIC PANEL - Abnormal; Notable for the following components:      Result Value   Potassium 3.4 (*)    Glucose, Bld 142 (*)    Calcium 8.8 (*)    All other components within normal limits  CBC  POC URINE PREG, ED  POCT PREGNANCY, URINE   ____________________________________________   RADIOLOGY Vela Prose, personally viewed and evaluated these images (plain radiographs) as part of my medical decision making, as well as reviewing the written report by the radiologist.  ED MD interpretation: Pending  Official radiology report(s): No results found.  ____________________________________________   PROCEDURES  Procedure(s) performed (including Critical Care):  Procedures   ____________________________________________   INITIAL IMPRESSION / ASSESSMENT AND PLAN / ED COURSE  JOYCE HEITMAN was evaluated in Emergency Department on 05/06/2020 for the symptoms described in the history of present illness. She was evaluated in the context of the global COVID-19 pandemic, which necessitated consideration that the patient might be at risk for infection with the SARS-CoV-2 virus that causes COVID-19. Institutional protocols and algorithms that pertain to the evaluation of patients at risk for COVID-19 are in a state of rapid change based on information released by regulatory bodies including the CDC and federal and state organizations. These policies and algorithms were followed during the patient's care in the ED.     Patient is a well-appearing 29 year old who comes in with headaches.  Most likely this is secondary to her baseline migraines.  It does not sound like a subarachnoid hemorrhage given it was gradual in onset over the past 2 days but given her history of AVMs I think it be wise to repeat a CTA to make sure no signs of aneurysm rupture, recurrent AVM, intracranial hemorrhage.  Patient has  no neck stiffness or fever to suggest meningitis.  She reports having a shot by not able to palpate warm exam I reviewed her prior CT imaging I do not see a shunt on the CT.  I went to call x-ray but patient was already got an x-ray and they also were not able to visualize a shunt either.  Patient was given initial migraine cocktail then her symptoms were somewhat getting better so given some magnesium, Haldol, steroids to try to help with the rest of her pain.  Patient did report some sadness about being in the hospital but she is able to contract for safety denies any SI.  At this time do not think she meets IVC criteria but she is willing to stay voluntarily and just talk with psychiatry for resources for outpatient.  Patient handed off to oncoming team pending CT scans, reevaluation of headache and further disposition      ____________________________________________   FINAL CLINICAL  IMPRESSION(S) / ED DIAGNOSES   Final diagnoses:  Other migraine without status migrainosus, intractable      MEDICATIONS GIVEN DURING THIS VISIT:  Medications  prochlorperazine (COMPAZINE) injection 10 mg (10 mg Intravenous Given 05/06/20 1408)  diphenhydrAMINE (BENADRYL) injection 12.5 mg (12.5 mg Intravenous Given 05/06/20 1409)  sodium chloride 0.9 % bolus 1,000 mL (0 mLs Intravenous Stopped 05/06/20 1507)  iohexol (OMNIPAQUE) 350 MG/ML injection 75 mL (75 mLs Intravenous Contrast Given 05/06/20 1517)     ED Discharge Orders    None       Note:  This document was prepared using Dragon voice recognition software and may include unintentional dictation errors.   Concha Se, MD 05/07/20 [phone]

## 2020-05-06 NOTE — ED Notes (Signed)
When asked if pt wants to hurt herself, pt states "i'm just tired of the hurting. To be honest yes ma'am." but pt denies thoughts of SI, just generalized depression.

## 2020-05-06 NOTE — ED Triage Notes (Signed)
Pt comes POV with migraine for two days. Reports pulsating all over her head. Recently d/c for kidney infection and has been taking all meds. Hx of aneurysm and headaches similar.

## 2020-05-06 NOTE — ED Notes (Signed)
First Nurse Note: Pt to ED for HA. Pt is in NAD

## 2020-05-06 NOTE — ED Notes (Signed)
Pt still wanting to leave AMA. Pt assured we are waiting on CT scan to result prior to discharge. Pt stating she wants to leave now. Pt IV taken out and and pt signed AMA form.

## 2020-05-06 NOTE — ED Notes (Signed)
Pt transported to xray 

## 2020-05-06 NOTE — ED Notes (Signed)
Pt stating migraine pain is still significant and has decreased only a little. Pt rating pain 9/10. MD made aware.

## 2020-05-09 ENCOUNTER — Telehealth: Payer: Self-pay | Admitting: Emergency Medicine

## 2020-05-09 NOTE — Telephone Encounter (Addendum)
Says she has the application filled out and knows the documents outside of that she needs to bring... should be stopping by later this week  ----- Message from Benjamin Stain, CMA sent at 05/04/2020  2:13 PM EDT ----- Referral from [facility]. Please contact patient to review eligibility. ----- Message ----- From: Chapman Fitch, RN Sent: 05/04/2020   1:11 PM EDT To: Benjamin Stain, CMA  Patient interested in establishing care.  I have provided application

## 2020-05-09 NOTE — Telephone Encounter (Signed)
Called patient to discuss follow up plans as she left ama.(  I had dr Lenard Lance review ct result and there is nothing acute, but she should just follow with her neurosurgeon.)  She says she does not have any neurosurgeon or any specialty care since her medicaid ran out. Says she was [facility] patient as a child.   She is aware of [facility].  I told her that [facility] has charity care that she can apply for, where charges are on sliding scale.  She  Says she will look into that.  I told her that she can always return here any time.

## 2020-05-23 ENCOUNTER — Telehealth: Payer: Self-pay | Admitting: General Practice

## 2020-05-23 NOTE — Telephone Encounter (Signed)
Individual has been contacted 3+ times regarding ED referral. No further attempts to contact individual will be made. 

## 2020-08-14 ENCOUNTER — Encounter: Payer: Self-pay | Admitting: Emergency Medicine

## 2020-08-14 ENCOUNTER — Other Ambulatory Visit: Payer: Self-pay

## 2020-08-14 DIAGNOSIS — K5900 Constipation, unspecified: Secondary | ICD-10-CM | POA: Insufficient documentation

## 2020-08-14 DIAGNOSIS — J45909 Unspecified asthma, uncomplicated: Secondary | ICD-10-CM | POA: Insufficient documentation

## 2020-08-14 DIAGNOSIS — I509 Heart failure, unspecified: Secondary | ICD-10-CM | POA: Insufficient documentation

## 2020-08-14 DIAGNOSIS — K625 Hemorrhage of anus and rectum: Secondary | ICD-10-CM | POA: Insufficient documentation

## 2020-08-14 DIAGNOSIS — F17210 Nicotine dependence, cigarettes, uncomplicated: Secondary | ICD-10-CM | POA: Insufficient documentation

## 2020-08-14 LAB — COMPREHENSIVE METABOLIC PANEL
ALT: 47 U/L — ABNORMAL HIGH (ref 0–44)
AST: 33 U/L (ref 15–41)
Albumin: 4.3 g/dL (ref 3.5–5.0)
Alkaline Phosphatase: 59 U/L (ref 38–126)
Anion gap: 13 (ref 5–15)
BUN: 6 mg/dL (ref 6–20)
CO2: 23 mmol/L (ref 22–32)
Calcium: 9.4 mg/dL (ref 8.9–10.3)
Chloride: 103 mmol/L (ref 98–111)
Creatinine, Ser: 0.7 mg/dL (ref 0.44–1.00)
GFR, Estimated: >60 mL/min (ref >60)
Glucose, Bld: 124 mg/dL — ABNORMAL HIGH (ref 70–99)
Potassium: 3.7 mmol/L (ref 3.5–5.1)
Sodium: 139 mmol/L (ref 135–145)
Total Bilirubin: 0.5 mg/dL (ref 0.3–1.2)
Total Protein: 8.3 g/dL — ABNORMAL HIGH (ref 6.5–8.1)

## 2020-08-14 LAB — CBC
HCT: 41.4 % (ref 36.0–46.0)
Hemoglobin: 13.5 g/dL (ref 12.0–15.0)
MCH: 29 pg (ref 26.0–34.0)
MCHC: 32.6 g/dL (ref 30.0–36.0)
MCV: 89 fL (ref 80.0–100.0)
Platelets: 334 10*3/uL (ref 150–400)
RBC: 4.65 MIL/uL (ref 3.87–5.11)
RDW: 12.3 % (ref 11.5–15.5)
WBC: 8.3 10*3/uL (ref 4.0–10.5)
nRBC: 0 % (ref 0.0–0.2)

## 2020-08-14 LAB — POCT PREGNANCY, URINE: Preg Test, Ur: NEGATIVE

## 2020-08-14 NOTE — ED Triage Notes (Addendum)
Pt presents to ED with headache for the past week with constipation for the past 4 days. Pt reports she was concerned after noticing bright red blood from her rectum when wiping the past two days after having a a couple small bowel movements. Pt denies abd pain or vomiting. Dx with internal hemorrhoids in June but pt states she thought they were better now and reports there is more blood this time.

## 2020-08-15 ENCOUNTER — Emergency Department
Admission: EM | Admit: 2020-08-15 | Discharge: 2020-08-15 | Disposition: A | Discharge status: Home / Self Care | Payer: Medicaid Other | Attending: Emergency Medicine | Admitting: Emergency Medicine

## 2020-08-15 DIAGNOSIS — K625 Hemorrhage of anus and rectum: Secondary | ICD-10-CM

## 2020-08-15 DIAGNOSIS — K5900 Constipation, unspecified: Secondary | ICD-10-CM

## 2020-08-15 MED ORDER — LACTULOSE 10 GM/15ML PO SOLN
30.0000 g | Freq: Once | ORAL | Status: AC
  Administered 2020-08-15: 30 g via ORAL
  Filled 2020-08-15: qty 60

## 2020-08-15 NOTE — Discharge Instructions (Signed)
You were seen in the emergency department today for constipation and rectal bleeding.  We believe that you have some internal hemorrhoids and they are being exacerbated and made to bleed as a result of your constipation.  We recommend that you use one or more of the following over-the-counter medications in the order described:   1)  Colace (or Dulcolax) 100 mg:  This is a stool softener, and you may take it once or twice a day as needed. 2)  Senna tablets:  This is a bowel stimulant that will help "push" out your stool. It is the next step to add after you have tried a stool softener. 3)  Miralax (powder):  This medication works by drawing additional fluid into your intestines and helps to flush out your stool.  Mix the powder with water or juice according to label instructions.  It may help if the Colace and Senna are not sufficient, but you must be sure to use the recommended amount of water or juice when you mix up the powder. 4)  Look for magnesium citrate at the pharmacy (it is usually a small glass bottle).  Drink the bottle according to the label instructions.  Remember that narcotic pain medications are constipating, so avoid them or minimize their use.  Drink plenty of fluids.  Also keep in mind what we discussed, that MiraLAX is a very safe and well-tolerated medication and treatment for chronic constipation and you can safely take a dose every morning with juice, Gatorade, or even water, and it will help you with your chronic constipation.  Please return to the Emergency Department immediately if you develop new or worsening symptoms that concern you, such as (but not limited to) fever > 101 degrees, severe abdominal pain, or persistent vomiting.

## 2020-08-15 NOTE — ED Provider Notes (Signed)
[facility] Emergency Department Provider Note  ____________________________________________   First MD Initiated Contact with Patient 08/15/20 0601     (approximate)  I have reviewed the triage vital signs and the nursing notes.   HISTORY  Chief Complaint Headache and Constipation    HPI Anna Tapia is a 29 y.o. female with noncontributory medical history as listed below who also suffers from issues with constipation.  She presents for evaluation of constipation for about a week.  She has had a few very small hard bowel movements and said that the Colace she has been taking does not seem to be helping.  She came in tonight because of the constipation but also because she has started to notice bright red blood in her stool and also sometimes when she is not having a bowel movement.  It is not painful although having the bowel movements themselves are sometimes painful.  She is not lightheaded and dizzy or weak.  She has been told in the past that she has internal hemorrhoids but she did not have any bleeding before the last couple of days.  Nothing particular seems to make the symptoms better or worse and she is concerned that it is severe.  She denies abdominal pain, nausea, vomiting, chest pain, shortness of breath, fever/chills, and dysuria.         Past Medical History:  Diagnosis Date  . Asthma   . Brain aneurysm   . CHF (congestive heart failure) [facility])     Patient Active Problem List   Diagnosis Date Noted  . Sepsis secondary to UTI (HCC) 05/01/2020  . 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 Right ovarian cyst 05/01/2020  . Elevated LFTs 05/01/2020  . Vaginal spotting 05/01/2020    Past Surgical History:  Procedure Laterality Date  . VENTRICULOPERITONEAL SHUNT      Prior to Admission medications   Medication Sig Start Date End Date Taking? Authorizing Provider  acetaminophen (TYLENOL) 325 MG tablet Take 650 mg by  mouth every 6 (six) hours as needed.    [provider]  dibucaine (NUPERCAINAL) 1 % OINT Place 1 application rectally 3 (three) times daily as needed. 03/04/20   Menshew, Charlesetta Ivory, PA-C  ondansetron (ZOFRAN ODT) 4 MG disintegrating tablet Take 1 tablet (4 mg total) by mouth every 8 (eight) hours as needed. 03/04/20   Menshew, Charlesetta Ivory, PA-C    Allergies Patient has no known allergies.  No family history on file.  Social History Social History   Tobacco Use  . Smoking status: Current Some Day Smoker    Types: Cigarettes  . Smokeless tobacco: Never Used  Vaping Use  . Vaping Use: Every day  Substance Use Topics  . Alcohol use: Yes    Comment: Rare  . Drug use: No    Review of Systems Constitutional: No fever/chills Eyes: No visual changes. ENT: No sore throat. Cardiovascular: Denies chest pain. Respiratory: Denies shortness of breath. Gastrointestinal: Constipation and bright red blood per rectum.  no abdominal pain, no nausea no vomiting. Genitourinary: Negative for dysuria. Musculoskeletal: Negative for neck pain.  Negative for back pain. Integumentary: Negative for rash. Neurological: Negative for headaches, focal weakness or numbness.   ____________________________________________   PHYSICAL EXAM:  VITAL SIGNS: ED Triage Vitals  Enc Vitals Group     BP 08/14/20 1934 (!) 131/92     Pulse Rate 08/14/20 1934 (!) 102     Resp 08/14/20 1934 20  Temp 08/14/20 1934 98.5 F (36.9 C)     Temp Source 08/14/20 1934 Oral     SpO2 08/14/20 1934 100 %     Weight 08/14/20 1935 52.2 kg (115 lb)     Height 08/14/20 1935 1.524 m (5')     Head Circumference --      Peak Flow --      Pain Score 08/14/20 1935 5     Pain Loc --      Pain Edu? --      Excl. in GC? --     Constitutional: Alert and oriented.  Eyes: Conjunctivae are normal.  Head: Atraumatic. Nose: No congestion/rhinnorhea. Mouth/Throat: Patient is wearing a mask. Neck: No stridor.  No  meningeal signs.   Cardiovascular: Normal rate, regular rhythm. Good peripheral circulation. Grossly normal heart sounds. Respiratory: Normal respiratory effort.  No retractions. Gastrointestinal: Soft and nontender. No distention.  Rectal exam is notable for no external abnormalities including no external hemorrhoids.  She has formed stool in her rectum but no large volume that would preclude passage of stool.  There is no gross blood present but the stool is Hemoccult positive.  ED nurse was present throughout the exam as a chaperone. Musculoskeletal: No lower extremity tenderness nor edema. No gross deformities of extremities. Neurologic:  Normal speech and language. No gross focal neurologic deficits are appreciated.  Skin:  Skin is warm, dry and intact. Psychiatric: Mood and affect are normal. Speech and behavior are normal.  ____________________________________________   LABS (all labs ordered are listed, but only abnormal results are displayed)  Labs Reviewed  COMPREHENSIVE METABOLIC PANEL - Abnormal; Notable for the following components:      Result Value   Glucose, Bld 124 (*)    Total Protein 8.3 (*)    ALT 47 (*)    All other components within normal limits  CBC  POC URINE PREG, ED  POCT PREGNANCY, URINE   ____________________________________________  EKG  No indication for emergent EKG ____________________________________________  RADIOLOGY I, Loleta Rose, personally viewed and evaluated these images (plain radiographs) as part of my medical decision making, as well as reviewing the written report by the radiologist.  ED MD interpretation: No indication for emergent imaging  Official radiology report(s): No results found.  ____________________________________________   PROCEDURES   Procedure(s) performed (including Critical Care):  Procedures   ____________________________________________   INITIAL IMPRESSION / MDM / ASSESSMENT AND PLAN / ED  COURSE  As part of my medical decision making, I reviewed the following data within the electronic MEDICAL RECORD NUMBER Nursing notes reviewed and incorporated, Labs reviewed , Old chart reviewed, Notes from prior ED visits and [place] Controlled Substance Database   Differential diagnosis includes, but is not limited to, constipation, electrolyte abnormality, hemorrhoids, AV malformation, diverticulosis, neoplasm.  The patient seems to suffer from a degree of chronic constipation.  She is taking some Dulcolax but nothing osmotic such as MiraLAX.  Her physical exam was reassuring with Hemoccult positive stool but no gross blood.  No abdominal tenderness no distention.  We discussed various options and I encouraged her to try over-the-counter glycerin suppositories, Dulcolax, and aggressive use of MiraLAX.  I am giving her a dose of lactulose 30 g by mouth prior to discharge and she is comfortable with the plan to go home.  She does not have a primary care doctor but I provided her the number of the patient navigator as well as with the GI clinic for follow-up.  Her rectal bleeding  does not appear to be the result of an acute or emergent medical condition requiring additional evaluation or admission to the hospital at this time and she knows to follow-up as an outpatient and return if she develops new or worsening symptoms.           ____________________________________________  FINAL CLINICAL IMPRESSION(S) / ED DIAGNOSES  Final diagnoses:  Constipation, unspecified constipation type  Rectal bleeding     MEDICATIONS GIVEN DURING THIS VISIT:  Medications  lactulose (CHRONULAC) 10 GM/15ML solution 30 g (has no administration in time range)     ED Discharge Orders    None      *Please note:  Anna Tapia was evaluated in Emergency Department on 08/15/2020 for the symptoms described in the history of present illness. She was evaluated in the context of the global COVID-19 pandemic,  which necessitated consideration that the patient might be at risk for infection with the SARS-CoV-2 virus that causes COVID-19. Institutional protocols and algorithms that pertain to the evaluation of patients at risk for COVID-19 are in a state of rapid change based on information released by regulatory bodies including the CDC and federal and state organizations. These policies and algorithms were followed during the patient's care in the ED.  Some ED evaluations and interventions may be delayed as a result of limited staffing during and after the pandemic.*  Note:  This document was prepared using Dragon voice recognition software and may include unintentional dictation errors.   Loleta Rose, MD 08/15/20 [phone]

## 2020-11-04 DIAGNOSIS — U071 COVID-19: Secondary | ICD-10-CM

## 2020-11-04 HISTORY — DX: COVID-19: U07.1

## 2020-11-10 ENCOUNTER — Other Ambulatory Visit: Payer: Self-pay

## 2020-11-10 ENCOUNTER — Emergency Department: Payer: Medicaid Other

## 2020-11-10 ENCOUNTER — Emergency Department
Admission: EM | Admit: 2020-11-10 | Discharge: 2020-11-10 | Disposition: A | Discharge status: Home / Self Care | Payer: Medicaid Other | Attending: Emergency Medicine | Admitting: Emergency Medicine

## 2020-11-10 DIAGNOSIS — J45909 Unspecified asthma, uncomplicated: Secondary | ICD-10-CM | POA: Insufficient documentation

## 2020-11-10 DIAGNOSIS — I509 Heart failure, unspecified: Secondary | ICD-10-CM | POA: Insufficient documentation

## 2020-11-10 DIAGNOSIS — R079 Chest pain, unspecified: Secondary | ICD-10-CM

## 2020-11-10 DIAGNOSIS — F17210 Nicotine dependence, cigarettes, uncomplicated: Secondary | ICD-10-CM | POA: Insufficient documentation

## 2020-11-10 LAB — BASIC METABOLIC PANEL
Anion gap: 9 (ref 5–15)
BUN: 9 mg/dL (ref 6–20)
CO2: 25 mmol/L (ref 22–32)
Calcium: 9.1 mg/dL (ref 8.9–10.3)
Chloride: 105 mmol/L (ref 98–111)
Creatinine, Ser: 0.63 mg/dL (ref 0.44–1.00)
GFR, Estimated: >60 mL/min (ref >60)
Glucose, Bld: 100 mg/dL — ABNORMAL HIGH (ref 70–99)
Potassium: 3.8 mmol/L (ref 3.5–5.1)
Sodium: 139 mmol/L (ref 135–145)

## 2020-11-10 LAB — CBC
HCT: 39.5 % (ref 36.0–46.0)
Hemoglobin: 13.2 g/dL (ref 12.0–15.0)
MCH: 30.1 pg (ref 26.0–34.0)
MCHC: 33.4 g/dL (ref 30.0–36.0)
MCV: 90 fL (ref 80.0–100.0)
Platelets: 362 10*3/uL (ref 150–400)
RBC: 4.39 MIL/uL (ref 3.87–5.11)
RDW: 12.4 % (ref 11.5–15.5)
WBC: 5.9 10*3/uL (ref 4.0–10.5)
nRBC: 0 % (ref 0.0–0.2)

## 2020-11-10 LAB — POC URINE PREG, ED
Preg Test, Ur: NEGATIVE
Preg Test, Ur: NEGATIVE

## 2020-11-10 LAB — TROPONIN I (HIGH SENSITIVITY): Troponin I (High Sensitivity): 3 ng/L (ref <18)

## 2020-11-10 MED ORDER — NAPROXEN 500 MG PO TABS
500.0000 mg | ORAL_TABLET | Freq: Once | ORAL | Status: AC
  Administered 2020-11-10: 500 mg via ORAL
  Filled 2020-11-10: qty 1

## 2020-11-10 NOTE — ED Provider Notes (Signed)
[facility] Emergency Department Provider Note  ____________________________________________   Event Date/Time   First MD Initiated Contact with Patient 11/10/20 [phone]     (approximate)  I have reviewed the triage vital signs and the nursing notes.   HISTORY  Chief Complaint Chest Pain   HPI Anna Tapia is a 30 y.o. female with a history of CHF, asthma, brain aneurysm presents to the emergency department for treatment and evaluation of heaviness. She has had similar pain in the past.  Since last night, she has felt like something was sitting on her chest. She is not had any shortness of breath, nausea, vomiting, diaphoresis. She has a history of clavicle fracture and states that when the weather changes she has pain that seems to radiate across her chest. She is not had any palpitations or syncope/near syncope episodes  Past Medical History:  Diagnosis Date  . Asthma   . Brain aneurysm   . CHF (congestive heart failure) [facility])     Patient Active Problem List   Diagnosis Date Noted  . Sepsis secondary to UTI ([place]) 05/01/2020  . 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 Right ovarian cyst 05/01/2020  . Elevated LFTs 05/01/2020  . Vaginal spotting 05/01/2020    Past Surgical History:  Procedure Laterality Date  . VENTRICULOPERITONEAL SHUNT      Prior to Admission medications   Medication Sig Start Date End Date Taking? Authorizing Provider  acetaminophen (TYLENOL) 325 MG tablet Take 650 mg by mouth every 6 (six) hours as needed.    [provider]  dibucaine (NUPERCAINAL) 1 % OINT Place 1 application rectally 3 (three) times daily as needed. 03/04/20   Menshew, Dannielle Karvonen, PA-C  ondansetron (ZOFRAN ODT) 4 MG disintegrating tablet Take 1 tablet (4 mg total) by mouth every 8 (eight) hours as needed. 03/04/20   Menshew, Dannielle Karvonen, PA-C    Allergies Patient has no known allergies.  No family history on  file.  Social History Social History   Tobacco Use  . Smoking status: Current Some Day Smoker    Types: Cigarettes  . Smokeless tobacco: Never Used  Vaping Use  . Vaping Use: Every day  Substance Use Topics  . Alcohol use: Yes    Comment: Rare  . Drug use: No    Review of Systems  Constitutional: No fever/chills. Eyes: No visual changes. ENT: No sore throat. Cardiovascular: Positive for chest pressure. Negative for pleuritic pain. Negative for palpitations. Negative for leg pain. Respiratory: Negative shortness of breath. Gastrointestinal: No abdominal pain. No nausea, no vomiting.  No diarrhea.  No constipation. Genitourinary: Negative for dysuria. Musculoskeletal: Negative for back pain.  Skin: Negative for rash, lesion, wound. Neurological: Negative for headaches, focal weakness or numbness. ____________________________________________   PHYSICAL EXAM:  VITAL SIGNS: ED Triage Vitals  Enc Vitals Group     BP 11/10/20 0801 104/77     Pulse Rate 11/10/20 0801 79     Resp 11/10/20 0801 18     Temp 11/10/20 0804 98.4 F (36.9 C)     Temp Source 11/10/20 0801 Oral     SpO2 11/10/20 0801 100 %     Weight 11/10/20 0801 120 lb (54.4 kg)     Height 11/10/20 0801 5' (1.524 m)     Head Circumference --      Peak Flow --      Pain Score 11/10/20 0801 9     Pain Loc --  Pain Edu? --      Excl. in GC? --     Constitutional: Alert and oriented. Well appearing and in no acute distress. Normal mental status. Eyes: Conjunctivae are normal. PERRL. Head: Atraumatic. Nose: No congestion/rhinnorhea. Mouth/Throat: Mucous membranes are moist.  Oropharynx non-erythematous. Tongue normal in size and color. Neck: No stridor. No carotid bruit appreciated on exam. Hematological/Lymphatic/Immunilogical: No cervical lymphadenopathy. Cardiovascular: Normal rate, regular rhythm. Grossly normal heart sounds.  Good peripheral circulation. Respiratory: Normal respiratory effort.  No  retractions. Lungs CTAB. Gastrointestinal: Soft and nontender. No distention. No abdominal bruits. No CVA tenderness. Genitourinary: Exam deferred. Musculoskeletal: No lower extremity tenderness. No edema of extremities. Neurologic:  Normal speech and language. No gross focal neurologic deficits are appreciated. Skin:  Skin is warm, dry and intact. No rash noted. Psychiatric: Mood and affect are normal. Speech and behavior are normal.  ____________________________________________   LABS (all labs ordered are listed, but only abnormal results are displayed)  Labs Reviewed  BASIC METABOLIC PANEL - Abnormal; Notable for the following components:      Result Value   Glucose, Bld 100 (*)    All other components within normal limits  CBC  POC URINE PREG, ED  POC URINE PREG, ED  TROPONIN I (HIGH SENSITIVITY)   ____________________________________________  EKG  ED ECG REPORT I, Lyana Asbill, FNP-BC personally viewed and interpreted this ECG.   Date: 11/10/2020  EKG Time: 0806  Rate: 77  Rhythm: normal EKG, normal sinus rhythm  Axis: normal  Intervals:none  ST&T Change: no ST elevation  ____________________________________________  RADIOLOGY  ED MD interpretation:  No acute cardiopulmonary abnormality.  I, Kem Boroughs, personally viewed and evaluated these images (plain radiographs) as part of my medical decision making, as well as reviewing the written report by the radiologist.  Official radiology report(s): DG Chest 2 View  Result Date: 11/10/2020 CLINICAL DATA:  Pt states chest pain/pressure "like someone is sitting on my chest" since last night, denies SOB, nausea. Hx of asthma and CHF. Current smoker. EXAM: CHEST - 2 VIEW COMPARISON:  05/06/2020 FINDINGS: Cardiac silhouette is borderline to mildly enlarged. No mediastinal or hilar masses. No evidence of of adenopathy. Clear lungs.  No pleural effusion or pneumothorax. Old healed right clavicle fracture. Skeletal  structures otherwise unremarkable. IMPRESSION: 1. No acute cardiopulmonary disease. Borderline to mildly increased heart size. Electronically Signed   By: Amie Portland M.D.   On: 11/10/2020 08:29    ____________________________________________   PROCEDURES  Procedure(s) performed: None  Procedures  Critical Care performed: No  ____________________________________________   INITIAL IMPRESSION / ASSESSMENT AND PLAN   As part of my medical decision making, I reviewed the following data within the electronic MEDICAL RECORD NUMBER     30 year old female presenting to the emergency department for treatment and evaluation of heaviness in her chest. See HPI for further details. EKG is unremarkable. Troponin is normal. She will be discharged home and advised to follow up with PCP or cardiology. She is to return to the ER for symptoms that change or worsen if unable to schedule an appointment.    FINAL CLINICAL IMPRESSION(S) / ED DIAGNOSES  Final diagnoses:  Nonspecific chest pain     ED Discharge Orders    None       PASCUALA KLUTTS was evaluated in Emergency Department on 11/10/2020 for the symptoms described in the history of present illness. She was evaluated in the context of the global COVID-19 pandemic, which necessitated consideration that the patient might  be at risk for infection with the SARS-CoV-2 virus that causes COVID-19. Institutional protocols and algorithms that pertain to the evaluation of patients at risk for COVID-19 are in a state of rapid change based on information released by regulatory bodies including the CDC and federal and state organizations. These policies and algorithms were followed during the patient's care in the ED.   Note:  This document was prepared using Dragon voice recognition software and may include unintentional dictation errors.   Chinita Pester, FNP 11/10/20 1601    Jene Every, MD 11/10/20 [phone]

## 2020-11-10 NOTE — ED Triage Notes (Signed)
Pt c/o chest pain/pressure "like someone is sitting on my chest" since last night, denies SOB, nausea or diaphoresis.Marland Kitchen pt is in NAD.Marland Kitchen

## 2020-11-10 NOTE — ED Notes (Signed)
Pt provided with warm blanket. 

## 2021-03-21 ENCOUNTER — Encounter: Payer: Self-pay | Admitting: Emergency Medicine

## 2021-03-21 ENCOUNTER — Emergency Department
Admission: EM | Admit: 2021-03-21 | Discharge: 2021-03-21 | Disposition: A | Discharge status: Home / Self Care | Payer: Self-pay | Attending: Emergency Medicine | Admitting: Emergency Medicine

## 2021-03-21 ENCOUNTER — Other Ambulatory Visit: Payer: Self-pay

## 2021-03-21 ENCOUNTER — Emergency Department: Payer: Self-pay

## 2021-03-21 DIAGNOSIS — I509 Heart failure, unspecified: Secondary | ICD-10-CM | POA: Insufficient documentation

## 2021-03-21 DIAGNOSIS — F17210 Nicotine dependence, cigarettes, uncomplicated: Secondary | ICD-10-CM | POA: Insufficient documentation

## 2021-03-21 DIAGNOSIS — J45909 Unspecified asthma, uncomplicated: Secondary | ICD-10-CM | POA: Insufficient documentation

## 2021-03-21 DIAGNOSIS — U071 COVID-19: Secondary | ICD-10-CM | POA: Insufficient documentation

## 2021-03-21 MED ORDER — BENZONATATE 100 MG PO CAPS: 100.0000 mg | ORAL_CAPSULE | Freq: Four times a day (QID) | ORAL | 0 refills | Status: AC | PRN

## 2021-03-21 MED ORDER — PSEUDOEPH-BROMPHEN-DM 30-2-10 MG/5ML PO SYRP: 10.0000 mL | ORAL_SOLUTION | Freq: Four times a day (QID) | ORAL | 0 refills | Status: DC | PRN

## 2021-03-21 MED ORDER — PREDNISONE 50 MG PO TABS: 50.0000 mg | ORAL_TABLET | Freq: Every day | ORAL | 0 refills | Status: DC

## 2021-03-21 MED ORDER — NIRMATRELVIR/RITONAVIR (PAXLOVID)TABLET: 3.0000 | ORAL_TABLET | Freq: Two times a day (BID) | ORAL | 0 refills | Status: AC

## 2021-03-21 NOTE — ED Triage Notes (Signed)
Pt comes with c/o COVID+ dx yesterday. Pt states fever, sweats and chills.

## 2021-03-21 NOTE — ED Notes (Signed)
Pt to X-Ray at this time.

## 2021-03-21 NOTE — ED Notes (Signed)
Pt back from xray at this time.

## 2021-03-21 NOTE — ED Notes (Signed)
Pt verbalized understanding of d/c instructions at this time. Prescriptions reviewed. Pt given opportunity to ask questions as needed. Pt ambulatory to ED lobby, NAD noted, steady gait noted.

## 2021-03-21 NOTE — ED Notes (Signed)
See triage note. Pt got positive home covid test yesterday.  Pt states hx of congestive heart failure. Pt states she is having trouble catching her breath starting two days ago. Pt states she had one episode of emesis this weekend.

## 2021-03-21 NOTE — ED Provider Notes (Signed)
[facility] Emergency Department Provider Note  ____________________________________________  Time seen: Approximately 4:51 PM  I have reviewed the triage vital signs and the nursing notes.   HISTORY  Chief Complaint COVID+    HPI Anna Tapia is a 30 y.o. female who presents the emergency department complaining of COVID-like symptoms.  Patient states that she is having headache, sore throat, cough.  Today she felt short of breath and presented to the emergency department.  No frank difficulty breathing.  Patient had some GI upset 3 days ago, none currently.  At this time patient has taken positive home COVID test yesterday.  History of asthma, CHF, and brain aneurysm.  Patient denies any chest pain, frank difficulty breathing, visual changes, unilateral weakness or numbness.        Past Medical History:  Diagnosis Date  . Asthma   . Brain aneurysm   . CHF (congestive heart failure) [facility])     Patient Active Problem List   Diagnosis Date Noted  . Sepsis secondary to UTI ([facility]) 05/01/2020  . 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 Right ovarian cyst 05/01/2020  . Elevated LFTs 05/01/2020  . Vaginal spotting 05/01/2020    Past Surgical History:  Procedure Laterality Date  . VENTRICULOPERITONEAL SHUNT      Prior to Admission medications   Medication Sig Start Date End Date Taking? Authorizing Provider  benzonatate (TESSALON PERLES) 100 MG capsule Take 1 capsule (100 mg total) by mouth every 6 (six) hours as needed for cough. 03/21/21 03/21/22 Yes Jonell Brumbaugh, Delorise Royals, PA-C  brompheniramine-pseudoephedrine-DM 30-2-10 MG/5ML syrup Take 10 mLs by mouth 4 (four) times daily as needed. 03/21/21  Yes Jahmeir Geisen, Delorise Royals, PA-C  nirmatrelvir/ritonavir EUA (PAXLOVID) TABS Take 3 tablets by mouth 2 (two) times daily for 5 days. Patient GFR is >60. Take nirmatrelvir (150 mg) two tablets twice daily for 5 days and ritonavir (100 mg) one tablet  twice daily for 5 days. 03/21/21 03/26/21 Yes Camillia Marcy, Delorise Royals, PA-C  predniSONE (DELTASONE) 50 MG tablet Take 1 tablet (50 mg total) by mouth daily with breakfast. 03/21/21  Yes Bekki Tavenner, Delorise Royals, PA-C  acetaminophen (TYLENOL) 325 MG tablet Take 650 mg by mouth every 6 (six) hours as needed.    [provider]  dibucaine (NUPERCAINAL) 1 % OINT Place 1 application rectally 3 (three) times daily as needed. 03/04/20   Menshew, Charlesetta Ivory, PA-C  ondansetron (ZOFRAN ODT) 4 MG disintegrating tablet Take 1 tablet (4 mg total) by mouth every 8 (eight) hours as needed. 03/04/20   Menshew, Charlesetta Ivory, PA-C    Allergies Patient has no known allergies.  No family history on file.  Social History Social History   Tobacco Use  . Smoking status: Current Some Day Smoker    Types: Cigarettes  . Smokeless tobacco: Never Used  Vaping Use  . Vaping Use: Every day  Substance Use Topics  . Alcohol use: Yes    Comment: Rare  . Drug use: No     Review of Systems  Constitutional: No fever/chills.  Positive for sweats Eyes: No visual changes. No discharge ENT: No upper respiratory complaints. Cardiovascular: no chest pain. Respiratory: Positive cough.  Mild SOB. Gastrointestinal: No abdominal pain.  No nausea, no vomiting.  No diarrhea.  No constipation. Musculoskeletal: Negative for musculoskeletal pain. Skin: Negative for rash, abrasions, lacerations, ecchymosis. Neurological: Negative for headaches, focal weakness or numbness.  10 System ROS otherwise negative.  ____________________________________________   PHYSICAL EXAM:  VITAL SIGNS:  ED Triage Vitals  Enc Vitals Group     BP 03/21/21 1506 125/89     Pulse Rate 03/21/21 1506 71     Resp 03/21/21 1506 19     Temp 03/21/21 1506 98.1 F (36.7 C)     Temp Source 03/21/21 1506 Oral     SpO2 03/21/21 1506 98 %     Weight --      Height --      Head Circumference --      Peak Flow --      Pain Score 03/21/21 1504 6      Pain Loc --      Pain Edu? --      Excl. in GC? --      Constitutional: Alert and oriented. Well appearing and in no acute distress. Eyes: Conjunctivae are normal. PERRL. EOMI. Head: Atraumatic. ENT:      Ears:       Nose: No congestion/rhinnorhea.      Mouth/Throat: Mucous membranes are moist.  Neck: No stridor.  Neck is supple full range of motion Hematological/Lymphatic/Immunilogical: No cervical lymphadenopathy. Cardiovascular: Normal rate, regular rhythm. Normal S1 and S2.  Good peripheral circulation. Respiratory: Normal respiratory effort without tachypnea or retractions. Lungs CTAB. Good air entry to the bases with no decreased or absent breath sounds. Gastrointestinal: Bowel sounds 4 quadrants. Soft and nontender to palpation. No guarding or rigidity. No palpable masses. No distention. No CVA tenderness. Musculoskeletal: Full range of motion to all extremities. No gross deformities appreciated. Neurologic:  Normal speech and language. No gross focal neurologic deficits are appreciated.  Skin:  Skin is warm, dry and intact. No rash noted. Psychiatric: Mood and affect are normal. Speech and behavior are normal. Patient exhibits appropriate insight and judgement.   ____________________________________________   LABS (all labs ordered are listed, but only abnormal results are displayed)  Labs Reviewed - No data to display ____________________________________________  EKG   ____________________________________________  RADIOLOGY I personally viewed and evaluated these images as part of my medical decision making, as well as reviewing the written report by the radiologist.  ED Provider Interpretation:   DG Chest 2 View  Result Date: 03/21/2021 CLINICAL DATA:  COVID, cough EXAM: CHEST - 2 VIEW COMPARISON:  11/10/2020 FINDINGS: Borderline to mild cardiomegaly. No focal opacity or pleural effusion. No pneumothorax. Mild dextrocurvature of the spine IMPRESSION: No  active cardiopulmonary disease.  Borderline to mild cardiomegaly Electronically Signed   By: Jasmine Pang M.D.   On: 03/21/2021 17:56    ____________________________________________    PROCEDURES  Procedure(s) performed:    Procedures    Medications - No data to display   ____________________________________________   INITIAL IMPRESSION / ASSESSMENT AND PLAN / ED COURSE  Pertinent labs & imaging results that were available during my care of the patient were reviewed by me and considered in my medical decision making (see chart for details).  Review of the [place] CSRS was performed in accordance of the [facility] prior to dispensing any controlled drugs.           Patient's diagnosis is consistent with COVID-19.  Patient presents emergency department multiple viral illness symptoms.  She has a positive COVID-19 test at home.  This time symptoms were consistent with COVID-19.  She does have a history of CHF given the shortness of breath chest x-ray was obtained.  No acute cardiopulmonary findings.  There is no chest pain.  No increased work of breathing.  Vital signs of been stable.  At this time symptoms are most consistent with COVID-19.  Have given strict return precautions to the patient to return for any new or worsening symptoms.  Differential included influenza, COVID-19, pneumonia, CHF exacerbation, PE.Marland Kitchen  Patient will have prescription for prednisone, Paxlovid, Bromfed cough syrup and Tessalon Perles.  Patient is given ED precautions to return to the ED for any worsening or new symptoms.     ____________________________________________  FINAL CLINICAL IMPRESSION(S) / ED DIAGNOSES  Final diagnoses:  COVID-19      NEW MEDICATIONS STARTED DURING THIS VISIT:  ED Discharge Orders         Ordered    predniSONE (DELTASONE) 50 MG tablet  Daily with breakfast        03/21/21 1820    nirmatrelvir/ritonavir EUA (PAXLOVID) TABS  2 times daily        03/21/21 1820     brompheniramine-pseudoephedrine-DM 30-2-10 MG/5ML syrup  4 times daily PRN        03/21/21 1820    benzonatate (TESSALON PERLES) 100 MG capsule  Every 6 hours PRN        03/21/21 1820              This chart was dictated using voice recognition software/Dragon. Despite best efforts to proofread, errors can occur which can change the meaning. Any change was purely unintentional.    Racheal Patches, PA-C 03/21/21 1823    Shaune Pollack, MD 03/27/21 2055

## 2021-11-04 DIAGNOSIS — I619 Nontraumatic intracerebral hemorrhage, unspecified: Secondary | ICD-10-CM

## 2021-11-04 HISTORY — DX: Nontraumatic intracerebral hemorrhage, unspecified: I61.9

## 2022-01-10 ENCOUNTER — Ambulatory Visit: Payer: Medicaid Other

## 2022-01-21 ENCOUNTER — Other Ambulatory Visit: Payer: Self-pay

## 2022-01-21 ENCOUNTER — Ambulatory Visit: Payer: Medicaid Other | Admitting: Nurse Practitioner

## 2022-01-21 VITALS — BP 135/78 | Ht 60.0 in | Wt 126.2 lb

## 2022-01-21 DIAGNOSIS — Z3009 Encounter for other general counseling and advice on contraception: Secondary | ICD-10-CM | POA: Diagnosis not present

## 2022-01-21 DIAGNOSIS — Z113 Encounter for screening for infections with a predominantly sexual mode of transmission: Secondary | ICD-10-CM

## 2022-01-21 DIAGNOSIS — Z309 Encounter for contraceptive management, unspecified: Secondary | ICD-10-CM | POA: Diagnosis not present

## 2022-01-21 DIAGNOSIS — Z0389 Encounter for observation for other suspected diseases and conditions ruled out: Secondary | ICD-10-CM | POA: Diagnosis not present

## 2022-01-21 DIAGNOSIS — Z01419 Encounter for gynecological examination (general) (routine) without abnormal findings: Secondary | ICD-10-CM

## 2022-01-21 DIAGNOSIS — Z1388 Encounter for screening for disorder due to exposure to contaminants: Secondary | ICD-10-CM | POA: Diagnosis not present

## 2022-01-21 DIAGNOSIS — A599 Trichomoniasis, unspecified: Secondary | ICD-10-CM

## 2022-01-21 DIAGNOSIS — F32A Depression, unspecified: Secondary | ICD-10-CM

## 2022-01-21 LAB — WET PREP FOR TRICH, YEAST, CLUE
Trichomonas Exam: POSITIVE — AB
Yeast Exam: NEGATIVE

## 2022-01-21 LAB — HM HIV SCREENING LAB: HM HIV Screening: NEGATIVE

## 2022-01-21 MED ORDER — METRONIDAZOLE 500 MG PO TABS: 500.0000 mg | ORAL_TABLET | Freq: Two times a day (BID) | ORAL | 0 refills | Status: DC

## 2022-01-21 MED ORDER — METRONIDAZOLE 500 MG PO TABS: 500.0000 mg | ORAL_TABLET | Freq: Two times a day (BID) | ORAL | 0 refills | Status: AC
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
## 2022-01-21 NOTE — Progress Notes (Signed)
Pt here for family planning visit and requested STD screening. Wet prep reviewed. Pt treated for Trich per providers orders.  ?Bwiedenheft RN ?

## 2022-01-21 NOTE — Progress Notes (Addendum)
Pt here for STD testing. Positive for Trich. Medication given per standing orders. Contact card given.  ?Norm Parcel RN ?

## 2022-01-25 LAB — IGP, APTIMA HPV
HPV Aptima: POSITIVE — AB
PAP Smear Comment: 0

## 2022-02-07 ENCOUNTER — Ambulatory Visit: Payer: Medicaid Other | Admitting: Licensed Clinical Social Worker

## 2022-02-07 DIAGNOSIS — F411 Generalized anxiety disorder: Secondary | ICD-10-CM

## 2022-02-07 DIAGNOSIS — F331 Major depressive disorder, recurrent, moderate: Secondary | ICD-10-CM
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
## 2022-02-14 ENCOUNTER — Ambulatory Visit: Payer: Medicaid Other | Admitting: Licensed Clinical Social Worker

## 2022-02-14 DIAGNOSIS — F411 Generalized anxiety disorder: Secondary | ICD-10-CM

## 2022-02-14 DIAGNOSIS — F331 Major depressive disorder, recurrent, moderate: Secondary | ICD-10-CM

## 2022-02-14 NOTE — Progress Notes (Signed)
Counselor/Therapist Progress Note ? ?Patient ID: Anna Tapia, MRN: [id number],   ? ?Date: 02/14/2022 ? ?Time Spent: 48 minutes   ? ?Treatment Type: Psychotherapy ? ?Reported Symptoms: Feelings of Worthlessness, Hopelessness, Obsessive thinking, Anhedonia, Sleep disturbance, Appetite disturbance, Isolation and withdrawal, Fatigue, Physical aches and pain, and Depression  ? ?Mental Status Exam: ? ?Appearance:   Casual and Well Groomed     ?Behavior:  Appropriate and Sharing  ?Motor:  Normal  ?Speech/Language:   Clear and Coherent and Normal Rate  ?Affect:  Appropriate, Congruent, Depressed, Full Range, and Tearful  ?Mood:  sad  ?Thought process:  normal  ?Thought content:    WNL  ?Sensory/Perceptual disturbances:    WNL  ?Orientation:  oriented to person, place, time/date, situation, and day of week  ?Attention:  Good  ?Concentration:  Good  ?Memory:  WNL  ?Fund of knowledge:   Good  ?Insight:    Good  ?Judgment:   Good  ?Impulse Control:  Good  ? ?Risk Assessment: ?Danger to Self:  No ?Self-injurious Behavior: No ?Danger to Others: No ?Duty to Warn:no ?Physical Aggression / Violence:No  ?Access to Firearms a concern: No  ?Gang Involvement:No  ? ?Subjective: Patient was receptive to feedback and intervention from LCSW and actively and effectively participated throughout the session sharing thoughts and feelings.  ?  ?Interventions: Cognitive Behavioral Therapy ?Checked in with patient and reviewed previous session, including assessment and goal of treatment. Reviewed CBTs, including a rational for all the new wave cognitive behavioral therapies. Explored patient's goal of treatment and continued to explore and understand patient's challenges and worked collaboratively to develop CBTs treatment plan. Provided support through active listening, validation of feelings, and highlighted patient's strengths.  ? ?Diagnosis: ?  ICD-10-CM   ?1. Major depressive disorder, recurrent episode, moderate (HCC)  F33.1   ?  ?2.  Generalized anxiety disorder  F41.1   ?  ? ?Plan: Patient's goal of treatment is to have a peace of mind. To stop thinking all of the time.  ? ?Treatment Target: Understand the relationship between thoughts, emotions, and behaviors  ?Psychoeducation on CBT model   ?Teach the connection between thoughts, emotions, and behaviors  ? ?Treatment Target: Increase Contact With the Present Moment  ?Explore client's ability to be ?in-touch? with the present moment ?Teach mindfulness skills  ?Noting or describing  ?Body scan meditation  ? ?Treatment Target: Increase realistic balanced thinking -to learn how to replace thinking with thoughts that are more accurate or helpful ?Explore patient's thoughts, beliefs, automatic thoughts, assumptions  ?Identify and replace unhelpful thinking patterns (upsetting ideas, self-talk and mental images) ?Process distress and allow for emotional release  ?Process distress/trauma, identify ?stuck? points and allow for emotional release  ?Questioning and challenging thoughts ?Cognitive reappraisal  ?Restructuring, Socratic questioning  ?Provided psychoeducation on core beliefs, explore, and assist patient in identifying core beliefs and noticing them   ? ?Treatment Target: Reducing vulnerability to ?emotional mind? ?Values clarification   ?Self-care - nutrition, sleep, exercise  ?Increase positive events ?Radical Acceptance  ? ?Future Appointments  ?Date Time Provider Department Center  ?02/21/2022  2:00 PM Kathreen Cosier, LCSW [facility] None  ? ? ?Kathreen Cosier, LCSW ? ?

## 2022-02-21 ENCOUNTER — Ambulatory Visit: Payer: Medicaid Other | Admitting: Licensed Clinical Social Worker

## 2022-02-21 DIAGNOSIS — F331 Major depressive disorder, recurrent, moderate: Secondary | ICD-10-CM

## 2022-02-21 DIAGNOSIS — F411 Generalized anxiety disorder: Secondary | ICD-10-CM

## 2022-02-21 NOTE — Progress Notes (Signed)
Counselor/Therapist Progress Note ? ?Patient ID: Anna Tapia, MRN: [id number],   ? ?Date: 02/21/2022 ? ?Time Spent: 45 minutes  ? ?Treatment Type: Psychotherapy ? ?Reported Symptoms:  Overall stable with increased anxiety since yesterday and low mood  ? ?Mental Status Exam: ? ?Appearance:   Casual and Well Groomed     ?Behavior:  Appropriate and Sharing  ?Motor:  Normal  ?Speech/Language:   Clear and Coherent and Normal Rate  ?Affect:  Appropriate, Congruent, and Full Range  ?Mood:  normal  ?Thought process:  normal  ?Thought content:    WNL  ?Sensory/Perceptual disturbances:    WNL  ?Orientation:  oriented to person, place, time/date, and situation  ?Attention:  Good  ?Concentration:  Good  ?Memory:  WNL  ?Fund of knowledge:   Good  ?Insight:    Good  ?Judgment:   Good  ?Impulse Control:  Good  ? ?Risk Assessment: ?Danger to Self:  No ?Self-injurious Behavior: No ?Danger to Others: No ?Duty to Warn:no ?Physical Aggression / Violence:No  ?Access to Firearms a concern: No  ?Gang Involvement:No  ? ?Subjective: Patient was engaged and cooperative throughout the session using time effectively to discuss thoughts and feelings. Patient was receptive to feedback and intervention from LCSW and reported engaging in activities with family over the weekend. Patient is likely to benefit from future treatment because they remain motivated to decrease symptoms and reports some benefit of regular sessions.       ? ?Interventions: Cognitive Behavioral Therapy ?Checked in with patient regarding her week. Reviewed previous session regarding engaging in activities. Taught patient about radical acceptance. LCSW assisted patient in processing their emotions about their recent and previous experiences with their mom. Validated patient's feelings. Provided support through active listening, validation of feelings, and highlighted patient's strengths.   ? ?Diagnosis: ?  ICD-10-CM   ?1. Major depressive disorder, recurrent episode,  moderate (HCC)  F33.1   ?  ?2. Generalized anxiety disorder  F41.1   ?  ? ?Plan: Patient's goal of treatment is to have a peace of mind. To stop thinking all of the time.  ?  ?Treatment Target: Understand the relationship between thoughts, emotions, and behaviors  ?Psychoeducation on CBT model   ?Teach the connection between thoughts, emotions, and behaviors  ?  ?Treatment Target: Increase Contact With the Present Moment  ?Explore client's ability to be ?in-touch? with the present moment ?Teach mindfulness skills  ?Noting or describing  ?Body scan meditation  ?  ?Treatment Target: Increase realistic balanced thinking -to learn how to replace thinking with thoughts that are more accurate or helpful ?Explore patient's thoughts, beliefs, automatic thoughts, assumptions  ?Identify and replace unhelpful thinking patterns (upsetting ideas, self-talk and mental images) ?Process distress and allow for emotional release  ?Process distress/trauma, identify ?stuck? points and allow for emotional release  ?Questioning and challenging thoughts ?Cognitive reappraisal  ?Restructuring, Socratic questioning  ?Provided psychoeducation on core beliefs, explore, and assist patient in identifying core beliefs and noticing them   ?  ?Treatment Target: Reducing vulnerability to ?emotional mind? ?Values clarification   ?Self-care - nutrition, sleep, exercise  ?Behavior activation  ?Increase positive events ?Radical Acceptance  ? ?Future Appointments  ?Date Time Provider Department Center  ?02/28/2022 11:00 AM Kathreen Cosier, LCSW [facility] None  ? ? ? ?Kathreen Cosier, LCSW ? ?

## 2022-02-23 ENCOUNTER — Other Ambulatory Visit: Payer: Self-pay

## 2022-02-23 ENCOUNTER — Emergency Department
Admission: EM | Admit: 2022-02-23 | Discharge: 2022-02-23 | Disposition: A | Discharge status: Home / Self Care | Payer: Medicaid Other | Attending: Emergency Medicine | Admitting: Emergency Medicine

## 2022-02-23 DIAGNOSIS — M542 Cervicalgia: Secondary | ICD-10-CM | POA: Insufficient documentation

## 2022-02-23 DIAGNOSIS — I509 Heart failure, unspecified: Secondary | ICD-10-CM | POA: Insufficient documentation

## 2022-02-23 DIAGNOSIS — G44209 Tension-type headache, unspecified, not intractable: Secondary | ICD-10-CM

## 2022-02-23 DIAGNOSIS — J45909 Unspecified asthma, uncomplicated: Secondary | ICD-10-CM | POA: Insufficient documentation

## 2022-02-23 MED ORDER — IBUPROFEN 400 MG PO TABS
400.0000 mg | ORAL_TABLET | Freq: Once | ORAL | Status: AC
  Administered 2022-02-23: 400 mg via ORAL
  Filled 2022-02-23: qty 1

## 2022-02-23 MED ORDER — CYCLOBENZAPRINE HCL 10 MG PO TABS: 10.0000 mg | ORAL_TABLET | Freq: Three times a day (TID) | ORAL | 0 refills | Status: AC | PRN

## 2022-02-23 MED ORDER — ACETAMINOPHEN 500 MG PO TABS
1000.0000 mg | ORAL_TABLET | Freq: Once | ORAL | Status: AC
  Administered 2022-02-23: 1000 mg via ORAL
  Filled 2022-02-23: qty 2

## 2022-02-23 MED ORDER — CYCLOBENZAPRINE HCL 10 MG PO TABS
5.0000 mg | ORAL_TABLET | Freq: Once | ORAL | Status: AC
  Administered 2022-02-23: 5 mg via ORAL
  Filled 2022-02-23: qty 1

## 2022-02-23 NOTE — Discharge Instructions (Addendum)
-  Take the cyclobenzaprine as prescribed.  You may additionally add Tylenol/ibuprofen as needed. ?-Please follow-up with your primary care provider. ?-Return to the emergency department anytime if you begin to experience any new or worsening symptoms. ?

## 2022-02-23 NOTE — ED Notes (Signed)
Pt to room alert and oriented on foot. Pt requesting lights to be turned off ?
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
## 2022-02-23 NOTE — ED Provider Notes (Signed)
? ?[facility] ?Provider Note ? ? ? Event Date/Time  ? First MD Initiated Contact with Patient 02/23/22 1406   ?  (approximate) ? ? ?History  ? ?Chief Complaint ?Headache ? ? ?HPI ?Anna Tapia is a 31 y.o. female, history of MDD, asthma, CHF, presents to the emergency department for evaluation of headache/neck pain.  She states that she fractured her clavicle a few years ago and since then she has had intermittent headaches and neck pain.  She states that the past few weeks, has been particularly bad.  Endorses paraspinal neck pain with headache along the posterior aspect of her head.  Patient states that she currently has a mild headache right now.  Denies fever/chills, chest pain, shortness of breath, abdominal pain, numbness/tingling in upper extremities, visual changes, hearing changes, rash/lesions, or nausea/vomiting ? ?History Limitations: No limitations. ? ?    ? ? ?Physical Exam  ?Triage Vital Signs: ?ED Triage Vitals  ?Enc Vitals Group  ?   BP 02/23/22 1402 134/88  ?   Pulse Rate 02/23/22 1402 72  ?   Resp 02/23/22 1402 18  ?   Temp 02/23/22 1402 98 ?F (36.7 ?C)  ?   Temp src --   ?   SpO2 02/23/22 1402 98 %  ?   Weight --   ?   Height --   ?   Head Circumference --   ?   Peak Flow --   ?   Pain Score 02/23/22 1401 9  ?   Pain Loc --   ?   Pain Edu? --   ?   Excl. in GC? --   ? ? ?Most recent vital signs: ?Vitals:  ? 02/23/22 1402  ?BP: 134/88  ?Pulse: 72  ?Resp: 18  ?Temp: 98 ?F (36.7 ?C)  ?SpO2: 98%  ? ? ?General: Awake, NAD.  ?Skin: Warm, dry. No rashes or lesions.  ?Eyes: PERRL.  EOMI.  Conjunctivae normal.  ?CV: Good peripheral perfusion.  ?Resp: Normal effort.  ?Abd: Soft, non-tender. No distention.  ?Neuro: At baseline. No gross neurological deficits.  ? ?Focused Exam: Notable paraspinal muscle tightness.  Patient maintains normal range of motion of her neck.  No midline cervical spine tenderness.  No nuchal rigidity. ? ?Physical Exam ? ? ? ?ED Results / Procedures /  Treatments  ?Labs ?(all labs ordered are listed, but only abnormal results are displayed) ?Labs Reviewed - No data to display ? ? ?EKG ?Not applicable. ? ? ?RADIOLOGY ? ?ED Provider Interpretation: Not applicable. ? ?No results found. ? ?PROCEDURES: ? ?Critical Care performed: Not applicable ? ?Procedures ? ? ? ?MEDICATIONS ORDERED IN ED: ?Medications  ?cyclobenzaprine (FLEXERIL) tablet 5 mg (5 mg Oral Given 02/23/22 1522)  ?ibuprofen (ADVIL) tablet 400 mg (400 mg Oral Given 02/23/22 1521)  ?acetaminophen (TYLENOL) tablet 1,000 mg (1,000 mg Oral Given 02/23/22 1520)  ? ? ? ?IMPRESSION / MDM / ASSESSMENT AND PLAN / ED COURSE  ?I reviewed the triage vital signs and the nursing notes. ?             ?               ? ?Differential diagnosis includes, but is not limited to, tension headache, migraine, cervical strain ? ?ED Course ?Patient appears well, vitals within normal limits.  NAD. ? ?Assessment/Plan ?Presentation consistent with tension headache.  Her symptoms appear chronic.  Low suspicion for any acute process requiring advanced imaging.  Her history is not concerning  for subarachnoid hemorrhage or meningitis.  She is currently endorsing mild pain in her neck and head at this time.  Treated here with acetaminophen, ibuprofen, and cyclobenzaprine.  We will provide her with a prescription for cyclobenzaprine for the next 5 days.  Encouraged her to follow-up with her primary care provider for ongoing management. ? ?Provided the patient with anticipatory guidance, return precautions, and educational material. Encouraged the patient to return to the emergency department at any time if they begin to experience any new or worsening symptoms. Patient expressed understanding and agreed with the plan.  ? ?  ? ? ?FINAL CLINICAL IMPRESSION(S) / ED DIAGNOSES  ? ?Final diagnoses:  ?Tension headache  ? ? ? ?Rx / DC Orders  ? ?ED Discharge Orders   ? ?      Ordered  ?  cyclobenzaprine (FLEXERIL) 10 MG tablet  3 times daily PRN        ? 02/23/22 1510  ? ?  ?  ? ?  ? ? ? ?Note:  This document was prepared using Dragon voice recognition software and may include unintentional dictation errors. ?  ?Varney Daily, [place] ?02/23/22 1714 ? ?  ?Georga Hacking, MD ?02/24/22 1104 ? ?

## 2022-02-28 ENCOUNTER — Ambulatory Visit: Payer: Medicaid Other | Admitting: Licensed Clinical Social Worker

## 2022-02-28 DIAGNOSIS — F411 Generalized anxiety disorder: Secondary | ICD-10-CM

## 2022-02-28 DIAGNOSIS — F331 Major depressive disorder, recurrent, moderate: Secondary | ICD-10-CM

## 2022-02-28 NOTE — Progress Notes (Signed)
Counselor/Therapist Progress Note ? ?Patient ID: Anna Tapia, MRN: [id number],   ? ?Date: 02/28/2022 ? ?Time Spent: 33 minutes  ? ?Treatment Type: Psychotherapy ? ?Reported Symptoms:  mild depressive symptoms and anxiety symptoms; decrease in ruminating negative thoughts ? ?Mental Status Exam: ? ?Appearance:   Casual, Neat, and Well Groomed     ?Behavior:  Appropriate and Sharing  ?Motor:  Normal  ?Speech/Language:   Clear and Coherent and Normal Rate  ?Affect:  Appropriate, Congruent, and Full Range  ?Mood:  normal  ?Thought process:  normal  ?Thought content:    WNL  ?Sensory/Perceptual disturbances:    WNL  ?Orientation:  oriented to person, place, time/date, and situation  ?Attention:  Good  ?Concentration:  Good  ?Memory:  WNL  ?Fund of knowledge:   Good  ?Insight:    Good  ?Judgment:   Good  ?Impulse Control:  Good  ? ?Risk Assessment: ?Danger to Self:  No ?Self-injurious Behavior: No ?Danger to Others: No ?Duty to Warn:no ?Physical Aggression / Violence:No  ?Access to Firearms a concern: No  ?Gang Involvement:No  ? ?Subjective: Patient was receptive to feedback and intervention from LCSW and actively and effectively participated throughout the session. Patient is likely to benefit from future treatment because she remains motivated to decrease depressive symptoms and anxiety and reports benefit of regular sessions in addressing these symptoms.   ?  ? ?Interventions: Cognitive Behavioral Therapy and Mindfulness Meditation ?Checked in with patient regarding their week. Engaged patient in processing challenges in relationship with her mother, validating patient's feelings. Briefly discussed radical acceptance. Provided Psychoeducation on mindfulness, engaged patient in mindfulness exercise, processed exercise, and contracted with patient to complete daily.  Provided support through active listening, validation of feelings, and highlighted patient's strengths.   ? ?Diagnosis: ?  ICD-10-CM   ?1. Major  depressive disorder, recurrent episode, moderate (HCC)  F33.1   ?  ?2. Generalized anxiety disorder  F41.1   ?  ? ?Plan: Patient's goal of treatment is to have a peace of mind. To stop thinking all of the time.  ?  ?Treatment Target: Understand the relationship between thoughts, emotions, and behaviors  ?Psychoeducation on CBT model   ?Teach the connection between thoughts, emotions, and behaviors  ?  ?Treatment Target: Increase Contact With the Present Moment  ?Explore client's ability to be ?in-touch? with the present moment ?Teach mindfulness skills  ?Noting or describing  ?Body scan meditation  ?  ?Treatment Target: Increase realistic balanced thinking -to learn how to replace thinking with thoughts that are more accurate or helpful ?Explore patient's thoughts, beliefs, automatic thoughts, assumptions  ?Identify and replace unhelpful thinking patterns (upsetting ideas, self-talk and mental images) ?Process distress and allow for emotional release  ?Process distress/trauma, identify ?stuck? points and allow for emotional release  ?Questioning and challenging thoughts ?Cognitive reappraisal  ?Restructuring, Socratic questioning  ?Provided psychoeducation on core beliefs, explore, and assist patient in identifying core beliefs and noticing them   ?  ?Treatment Target: Reducing vulnerability to ?emotional mind? ?Values clarification   ?Self-care - nutrition, sleep, exercise  ?Behavior activation  ?Increase positive events ?Radical Acceptance  ? ?Future Appointments  ?Date Time Provider Department Center  ?03/21/2022 11:00 AM Kathreen Cosier, LCSW [facility] None  ? ? ?Kathreen Cosier, LCSW ? ?

## 2022-03-21 ENCOUNTER — Ambulatory Visit: Payer: Medicaid Other | Admitting: Licensed Clinical Social Worker

## 2022-03-21 DIAGNOSIS — F411 Generalized anxiety disorder: Secondary | ICD-10-CM

## 2022-03-21 DIAGNOSIS — F331 Major depressive disorder, recurrent, moderate: Secondary | ICD-10-CM

## 2022-03-21 NOTE — Progress Notes (Signed)
Counselor/Therapist Progress Note  Patient ID: Anna Tapia, MRN: [id number],    Date: 03/21/2022  Time Spent: 45 minutes   Treatment Type: Psychotherapy  Reported Symptoms:  mild depressive symptoms and mild anxiety symptoms  Mental Status Exam:  Appearance:   Casual and Well Groomed     Behavior:  Appropriate and Sharing  Motor:  Normal  Speech/Language:   Clear and Coherent and Normal Rate  Affect:  Appropriate, Congruent, and Full Range  Mood:  normal  Thought process:  normal  Thought content:    WNL  Sensory/Perceptual disturbances:    WNL  Orientation:  oriented to person, place, time/date, and situation  Attention:  Good  Concentration:  Good  Memory:  WNL  Fund of knowledge:   Good  Insight:    Good  Judgment:   Good  Impulse Control:  Good   Risk Assessment: Danger to Self:  No Self-injurious Behavior: No Danger to Others: No Duty to Warn:no Physical Aggression / Violence:No  Access to Firearms a concern: No  Gang Involvement:No   Subjective: Patient was receptive to feedback and intervention from LCSW and actively and effectively participated throughout the session. Patient is likely to benefit from future treatment because she remains motivated to decrease depression and anxiety and reports benefit of regular sessions in addressing these symptoms.      Interventions: Cognitive Behavioral Therapy Checked in with patient regarding their week. LCSW assisted patient in processing their thoughts and emotions about what they have experienced in their relationship with their mom. LCSW highlighted unhelpful thoughts and reframed thoughts leading to distress. LCSW provided psychoeducation on trauma and radical acceptance. Provided patient with Unhelpful Thinking Styles worksheet to read over prior to next session.  Provided support through active listening, validation of feelings, and highlighted patient's strengths.    Diagnosis:   ICD-10-CM   1. Major  depressive disorder, recurrent episode, moderate (HCC)  F33.1     2. Generalized anxiety disorder  F41.1      Plan: Review Unhelpful Thought Styles at next session.   Patient's goal of treatment is to have a peace of mind. To stop thinking all of the time.    Treatment Target: Understand the relationship between thoughts, emotions, and behaviors  Psychoeducation on CBT model   Teach the connection between thoughts, emotions, and behaviors    Treatment Target: Increase Contact With the Present Moment  Explore client's ability to be "in-touch" with the present moment Teach mindfulness skills  Noting or describing  Body scan meditation    Treatment Target: Increase realistic balanced thinking -to learn how to replace thinking with thoughts that are more accurate or helpful Explore patient's thoughts, beliefs, automatic thoughts, assumptions  Identify and replace unhelpful thinking patterns (upsetting ideas, self-talk and mental images) Process distress and allow for emotional release  Process distress/trauma, identify "stuck" points and allow for emotional release  Questioning and challenging thoughts Cognitive reappraisal  Restructuring, Socratic questioning  Provided psychoeducation on core beliefs, explore, and assist patient in identifying core beliefs and noticing them     Treatment Target: Reducing vulnerability to "emotional mind" Values clarification   Self-care - nutrition, sleep, exercise  Behavior activation  Increase positive events Radical Acceptance   Future Appointments  Date Time Provider Department Center  03/27/2022 11:30 AM Kathreen Cosier, LCSW [facility] None   Kathreen Cosier, LCSW

## 2022-03-27 ENCOUNTER — Ambulatory Visit: Payer: Medicaid Other | Admitting: Licensed Clinical Social Worker

## 2022-03-27 DIAGNOSIS — F411 Generalized anxiety disorder: Secondary | ICD-10-CM

## 2022-03-27 DIAGNOSIS — F331 Major depressive disorder, recurrent, moderate: Secondary | ICD-10-CM

## 2022-03-27 NOTE — Progress Notes (Signed)
Counselor/Therapist Progress Note  Patient ID: CITLALLI GARGUILO, MRN: [id number],    Date: 03/27/2022  Time Spent: 45 minutes    Treatment Type: Psychotherapy  Reported Symptoms:  Anxiety, anxious thoughts, irritability, overwhelm   Mental Status Exam:  Appearance:   Casual and Neat     Behavior:  Appropriate and Sharing  Motor:  Normal  Speech/Language:   Clear and Coherent and Normal Rate  Affect:  Appropriate, Congruent, and Full Range  Mood:  normal  Thought process:  normal  Thought content:    WNL  Sensory/Perceptual disturbances:    WNL  Orientation:  oriented to person, place, time/date, and situation  Attention:  Good  Concentration:  Good  Memory:  WNL  Fund of knowledge:   Good  Insight:    Good  Judgment:   Good  Impulse Control:  Good   Risk Assessment: Danger to Self:  No Self-injurious Behavior: No Danger to Others: No Duty to Warn:no Physical Aggression / Violence:No  Access to Firearms a concern: No  Gang Involvement:No   Subjective: Patient was receptive to feedback and intervention from LCSW and actively and effectively participated throughout the session. Patient is likely to benefit from future treatment because she remains motivated to decrease depression and anxiety and reports benefit of regular sessions in addressing these symptoms.      Interventions: Cognitive Behavioral Therapy and Mindfulness Meditation Checked in with patient regarding her week. LCSW provided supportive space encouraging emotional release and processing of current psychosocial stressors, continued challenges with where she is in her life. LCSW checked in with patient about completing tasks towards stated goals and addressed barriers/ avoidance behavior. LCSW reviewed mindfulness meditation and the mindfulness attitudes encouraging patient to engage in guided practice daily and use of 5 senses to be present. Provided support through active listening, validation of feelings,  and highlighted patient's strengths.    Diagnosis:   ICD-10-CM   1. Generalized anxiety disorder  F41.1     2. Major depressive disorder, recurrent episode, moderate ([place])  F33.1      Plan: Patient's goal of treatment is to have a peace of mind. To stop thinking all of the time.    Treatment Target: Understand the relationship between thoughts, emotions, and behaviors  Psychoeducation on CBT model   Teach the connection between thoughts, emotions, and behaviors    Treatment Target: Increase Contact With the Present Moment  Explore client's ability to be "in-touch" with the present moment Teach mindfulness skills  Noting or describing  Body scan meditation    Treatment Target: Increase realistic balanced thinking -to learn how to replace thinking with thoughts that are more accurate or helpful Explore patient's thoughts, beliefs, automatic thoughts, assumptions  Identify and replace unhelpful thinking patterns (upsetting ideas, self-talk and mental images) Process distress and allow for emotional release  Process distress/trauma, identify "stuck" points and allow for emotional release  Questioning and challenging thoughts Cognitive reappraisal  Restructuring, Socratic questioning  Provided psychoeducation on core beliefs, explore, and assist patient in identifying core beliefs and noticing them     Treatment Target: Reducing vulnerability to "emotional mind" Values clarification   Self-care - nutrition, sleep, exercise  Behavior activation  Increase positive events Radical Acceptance   Future Appointments  Date Time Provider [place]  04/03/2022 11:00 AM Milton Ferguson, LCSW [facility] None    Milton Ferguson, [place]

## 2022-04-03 ENCOUNTER — Ambulatory Visit: Payer: Medicaid Other | Admitting: Licensed Clinical Social Worker

## 2022-04-16 ENCOUNTER — Emergency Department
Admission: EM | Admit: 2022-04-16 | Discharge: 2022-04-16 | Disposition: A | Discharge status: Home / Self Care | Payer: Self-pay | Attending: Emergency Medicine | Admitting: Emergency Medicine

## 2022-04-16 ENCOUNTER — Other Ambulatory Visit: Payer: Self-pay

## 2022-04-16 DIAGNOSIS — K5900 Constipation, unspecified: Secondary | ICD-10-CM | POA: Insufficient documentation

## 2022-04-16 DIAGNOSIS — K649 Unspecified hemorrhoids: Secondary | ICD-10-CM | POA: Insufficient documentation

## 2022-04-16 DIAGNOSIS — R42 Dizziness and giddiness: Secondary | ICD-10-CM | POA: Insufficient documentation

## 2022-04-16 LAB — BASIC METABOLIC PANEL
Anion gap: 7 (ref 5–15)
BUN: 7 mg/dL (ref 6–20)
CO2: 26 mmol/L (ref 22–32)
Calcium: 9.2 mg/dL (ref 8.9–10.3)
Chloride: 106 mmol/L (ref 98–111)
Creatinine, Ser: 0.68 mg/dL (ref 0.44–1.00)
GFR, Estimated: >60 mL/min (ref >60)
Glucose, Bld: 121 mg/dL — ABNORMAL HIGH (ref 70–99)
Potassium: 3.8 mmol/L (ref 3.5–5.1)
Sodium: 139 mmol/L (ref 135–145)

## 2022-04-16 LAB — CBC
HCT: 38.2 % (ref 36.0–46.0)
Hemoglobin: 12.6 g/dL (ref 12.0–15.0)
MCH: 29.2 pg (ref 26.0–34.0)
MCHC: 33 g/dL (ref 30.0–36.0)
MCV: 88.4 fL (ref 80.0–100.0)
Platelets: 268 10*3/uL (ref 150–400)
RBC: 4.32 MIL/uL (ref 3.87–5.11)
RDW: 11.9 % (ref 11.5–15.5)
WBC: 7.5 10*3/uL (ref 4.0–10.5)
nRBC: 0 % (ref 0.0–0.2)

## 2022-04-16 MED ORDER — DIBUCAINE (PERIANAL) 1 % EX OINT: 1.0000 "application " | TOPICAL_OINTMENT | Freq: Three times a day (TID) | CUTANEOUS | 0 refills | Status: DC | PRN

## 2022-04-16 MED ORDER — HYDROCORTISONE ACETATE 25 MG RE SUPP: 25.0000 mg | Freq: Two times a day (BID) | RECTAL | 0 refills | Status: AC

## 2022-04-16 NOTE — Discharge Instructions (Addendum)
Use the prescription meds as directed.  Increase the MiraLAX to 3 times daily for the next few days until stools are soft and spontaneous.  Follow-up with your primary provider in the community clinic for ongoing symptoms.  Return to the ED if needed.

## 2022-04-16 NOTE — ED Triage Notes (Signed)
Pt states she was dx with internal hemorrhoids a year ago but states the bleeding and the pain have gotten worse. Pt also states lightheadedness after having a bowel movement due to the straining.

## 2022-04-17 ENCOUNTER — Ambulatory Visit: Payer: Medicaid Other | Admitting: Licensed Clinical Social Worker

## 2022-04-17 DIAGNOSIS — F411 Generalized anxiety disorder: Secondary | ICD-10-CM

## 2022-04-17 DIAGNOSIS — F331 Major depressive disorder, recurrent, moderate: Secondary | ICD-10-CM
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
## 2022-04-17 NOTE — ED Provider Notes (Cosign Needed)
[facility] Emergency Department Provider Note     Event Date/Time   First MD Initiated Contact with Patient 04/16/22 1734     (approximate)   History   Hemorrhoids   HPI  Anna Tapia is a 31 y.o. female presents to the ED for evaluation of bright red blood per rectum with recent firm and straining bowel movements.  She denies any nausea, vomiting, or dizziness patient also denies any fevers, chills, sweats.  Patient reports some lightheadedness after sitting on the toilet for prolonged period and straining to have bowel movements.  She denies any recent use of medications for her report of internal hemorrhoids.     Physical Exam   Triage Vital Signs: ED Triage Vitals [04/16/22 1620]  Enc Vitals Group     BP 125/80     Pulse Rate 83     Resp 16     Temp 98.9 F (37.2 C)     Temp Source Oral     SpO2 99 %     Weight 124 lb (56.2 kg)     Height 5' (1.524 m)     Head Circumference      Peak Flow      Pain Score 7     Pain Loc      Pain Edu?      Excl. in [place]?     Most recent vital signs: Vitals:   04/16/22 1620  BP: 125/80  Pulse: 83  Resp: 16  Temp: 98.9 F (37.2 C)  SpO2: 99%    General Awake, no distress.  CV:  Good peripheral perfusion.  RESP:  Normal effort.  ABD:  No distention. DRE deferred   ED Results / Procedures / Treatments   Labs (all labs ordered are listed, but only abnormal results are displayed) Labs Reviewed  BASIC METABOLIC PANEL - Abnormal; Notable for the following components:      Result Value   Glucose, Bld 121 (*)    All other components within normal limits  CBC     EKG    RADIOLOGY   No results found.   PROCEDURES:  Critical Care performed: No  Procedures   MEDICATIONS ORDERED IN ED: Medications - No data to display   IMPRESSION / MDM / [place] / ED COURSE  I reviewed the triage vital signs and the nursing notes.                               Differential diagnosis includes, but is not limited to, hemorrhoids, anal fissures, fecal impaction, constipation  Patient's presentation is most consistent with acute, uncomplicated illness.  Patient to the ED for evaluation of bright red blood per rectum with associated firm stools and constipation.  Patient gives a history of hemorrhoids diagnosed about a year prior.  She denies any current treatment.  She was concerned for the bright red bleeding, that she might be anemic.  Patient's work-up is overall reassuring.  She is stable without signs of critical anemia, hypotension, or acute abdominal process.  Patient has declined a visual or DRE at this time.  Patient's diagnosis is consistent with hemorrhoid. Patient will be discharged home with prescriptions for Dibucaine and Anusol suppositories. Patient is to follow up with her primary provider as needed or otherwise directed. Patient is given ED precautions to return to the ED for any worsening or new symptoms.  FINAL CLINICAL IMPRESSION(S) / ED DIAGNOSES   Final diagnoses:  Hemorrhoids, unspecified hemorrhoid type  Constipation, unspecified constipation type     Rx / DC Orders   ED Discharge Orders          Ordered    dibucaine (NUPERCAINAL) 1 % OINT  3 times daily PRN        04/16/22 1753    hydrocortisone (ANUSOL-HC) 25 MG suppository  Every 12 hours        04/16/22 1753             Note:  This document was prepared using Dragon voice recognition software and may include unintentional dictation errors.    Melvenia Needles, PA-C 04/18/22 0001

## 2022-05-02 ENCOUNTER — Ambulatory Visit: Payer: Medicaid Other | Admitting: Licensed Clinical Social Worker

## 2022-05-02 DIAGNOSIS — F331 Major depressive disorder, recurrent, moderate: Secondary | ICD-10-CM

## 2022-05-02 DIAGNOSIS — F411 Generalized anxiety disorder: Secondary | ICD-10-CM

## 2022-05-02 NOTE — Progress Notes (Signed)
Counselor/Therapist Progress Note  Patient ID: Anna Tapia, MRN: [id number],    Date: 05/02/2022  Time Spent: 45 minutes   Treatment Type: Individual Therapy  Reported Symptoms:  Depressed mood, sadness, nightmares, physical pain, low energy and low motivation, difficulties getting out of bed; anxiety, anxious thoughts  Mental Status Exam:  Appearance:   Neat and Well Groomed     Behavior:  Appropriate and Sharing  Motor:  Normal  Speech/Language:   Clear and Coherent and Normal Rate  Affect:  Appropriate, Congruent, and Full Range  Mood:  normal  Thought process:  normal  Thought content:    WNL  Sensory/Perceptual disturbances:    WNL  Orientation:  oriented to person, place, time/date, and situation  Attention:  Good  Concentration:  Good  Memory:  WNL  Fund of knowledge:   Good  Insight:    Good  Judgment:   Good  Impulse Control:  Good   Risk Assessment: Danger to Self:  No Self-injurious Behavior: No Danger to Others: No Duty to Warn:no Physical Aggression / Violence:No  Access to Firearms a concern: No  Gang Involvement:No   Subjective: Patient was receptive to feedback and intervention from LCSW and actively and effectively participated throughout the session. Patient is likely to benefit from future treatment because she remains motivated to decrease depressive symptoms and anxiety and reports benefit from mindfulness meditation and use of CBT strategies learned in sessions.     Interventions: Cognitive Behavioral Therapy and Mindfulness Meditation Checked in with patient regarding their week. LCSW engaged patient in processing their thoughts and emotions about what they have experienced 2 years ago when she was hospitalized, and with unresolved past issues with family. LCSW taught patient about triggers, highlighted unhelpful thoughts and reframed thoughts and discussed noticing unhelpful thoughts leading to increased distress. LCSW encouraged patient to  continue mindfulness meditations. Provided support through active listening, validation of feelings, and highlighted patient's strengths.    Diagnosis:   ICD-10-CM   1. Generalized anxiety disorder  F41.1     2. Major depressive disorder, recurrent episode, moderate (HCC)  F33.1      Plan:  Patient's goal of treatment is to have a peace of mind. To stop thinking all of the time.    Treatment Target: Understand the relationship between thoughts, emotions, and behaviors  Psychoeducation on CBT model   Teach the connection between thoughts, emotions, and behaviors    Treatment Target: Increase Contact With the Present Moment  Explore client's ability to be "in-touch" with the present moment Teach mindfulness skills  Noting or describing  Body scan meditation    Treatment Target: Increase realistic balanced thinking -to learn how to replace thinking with thoughts that are more accurate or helpful Explore patient's thoughts, beliefs, automatic thoughts, assumptions  Identify and replace unhelpful thinking patterns (upsetting ideas, self-talk and mental images) Process distress and allow for emotional release  Process distress/trauma, identify "stuck" points and allow for emotional release  Questioning and challenging thoughts Cognitive reappraisal  Restructuring, Socratic questioning  Provided psychoeducation on core beliefs, explore, and assist patient in identifying core beliefs and noticing them     Treatment Target: Reducing vulnerability to "emotional mind" Values clarification   Self-care - nutrition, sleep, exercise  Behavior activation  Increase positive events Radical Acceptance   Future Appointments  Date Time Provider Department Center  05/09/2022 11:20 AM Kathreen Cosier, LCSW [facility] None     Kathreen Cosier, LCSW

## 2022-05-09 ENCOUNTER — Ambulatory Visit: Payer: Medicaid Other | Admitting: Licensed Clinical Social Worker

## 2022-05-09 DIAGNOSIS — F331 Major depressive disorder, recurrent, moderate: Secondary | ICD-10-CM

## 2022-05-09 DIAGNOSIS — F411 Generalized anxiety disorder: Secondary | ICD-10-CM
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
## 2022-05-23 ENCOUNTER — Ambulatory Visit: Payer: Medicaid Other | Admitting: Licensed Clinical Social Worker

## 2022-05-23 DIAGNOSIS — F331 Major depressive disorder, recurrent, moderate: Secondary | ICD-10-CM

## 2022-05-23 DIAGNOSIS — F411 Generalized anxiety disorder: Secondary | ICD-10-CM

## 2022-05-23 NOTE — Progress Notes (Signed)
Counselor/Therapist Progress Note  Patient ID: Anna Tapia, MRN: [id number],    Date: 05/23/2022  Time Spent: 30 minutes    Treatment Type: Psychotherapy  Reported Symptoms: Feelings of Worthlessness, Hopelessness, Anhedonia, Isolation and withdrawal, Fatigue, Physical aches and pain, Lack of motivation, and depressed, sleep disturbance  Mental Status Exam:  Appearance:   Casual and Well Groomed     Behavior:  Appropriate and Sharing  Motor:  Normal  Speech/Language:   Clear and Coherent and Normal Rate  Affect:  Appropriate, Congruent, and Full Range  Mood:  normal  Thought process:  normal  Thought content:    WNL  Sensory/Perceptual disturbances:    WNL  Orientation:  oriented to person, place, time/date, and situation  Attention:  Good  Concentration:  Good  Memory:  WNL  Fund of knowledge:   Good  Insight:    Good  Judgment:   Good  Impulse Control:  Good   Risk Assessment: Danger to Self:  No Self-injurious Behavior: No Danger to Others: No Duty to Warn:no Physical Aggression / Violence:No  Access to Firearms a concern: No  Gang Involvement:No   Subjective: Patient was receptive to feedback and intervention from LCSW and actively and effectively participated throughout the session. Patient reports trying to use cognitive strategies and mindfulness to manage symptoms and reports that it has been too difficult. She is open to transferring care to [facility] to seek medication management and is in agreement to terminate services at this time. Patient is likely to benefit from psychiatric medication management evaluation.   Interventions: Cognitive Behavioral Therapy Checked in with patient about her week. Continued depressive symptoms and anxiety. Discussed lack of lasting benefit from coping skills, including mindfulness practice, cognitive strategies and behavior activation. Discussed termination of services and patient seeking future care at [facility] to have access to  possible medication management due to lack of symptom improvement. Provided support through active listening, validation of feelings, and highlighted patient's strengths.    Diagnosis:   ICD-10-CM   1. Generalized anxiety disorder  F41.1     2. Major depressive disorder, recurrent episode, moderate (HCC)  F33.1      Plan: Patient to transfer care to [facility] due to lack of symptoms reduction and need for medication evaluation.    No future appointments.  Kathreen Cosier, LCSW

## 2022-07-03 ENCOUNTER — Other Ambulatory Visit: Payer: Self-pay

## 2022-07-03 ENCOUNTER — Emergency Department: Payer: Self-pay

## 2022-07-03 ENCOUNTER — Emergency Department
Admission: EM | Admit: 2022-07-03 | Discharge: 2022-07-03 | Disposition: A | Discharge status: Home / Self Care | Payer: Self-pay | Attending: Emergency Medicine | Admitting: Emergency Medicine

## 2022-07-03 DIAGNOSIS — Z20822 Contact with and (suspected) exposure to covid-19: Secondary | ICD-10-CM | POA: Insufficient documentation

## 2022-07-03 DIAGNOSIS — R110 Nausea: Secondary | ICD-10-CM | POA: Insufficient documentation

## 2022-07-03 DIAGNOSIS — R0789 Other chest pain: Secondary | ICD-10-CM | POA: Insufficient documentation

## 2022-07-03 DIAGNOSIS — J45909 Unspecified asthma, uncomplicated: Secondary | ICD-10-CM | POA: Insufficient documentation

## 2022-07-03 DIAGNOSIS — I509 Heart failure, unspecified: Secondary | ICD-10-CM | POA: Insufficient documentation

## 2022-07-03 DIAGNOSIS — R0602 Shortness of breath: Secondary | ICD-10-CM | POA: Insufficient documentation

## 2022-07-03 LAB — CBC
HCT: 40.7 % (ref 36.0–46.0)
Hemoglobin: 13 g/dL (ref 12.0–15.0)
MCH: 28.6 pg (ref 26.0–34.0)
MCHC: 31.9 g/dL (ref 30.0–36.0)
MCV: 89.6 fL (ref 80.0–100.0)
Platelets: 311 10*3/uL (ref 150–400)
RBC: 4.54 MIL/uL (ref 3.87–5.11)
RDW: 12.2 % (ref 11.5–15.5)
WBC: 7.7 10*3/uL (ref 4.0–10.5)
nRBC: 0 % (ref 0.0–0.2)

## 2022-07-03 LAB — SARS CORONAVIRUS 2 BY RT PCR: SARS Coronavirus 2 by RT PCR: NEGATIVE

## 2022-07-03 LAB — HEPATIC FUNCTION PANEL
ALT: 39 U/L (ref 0–44)
AST: 34 U/L (ref 15–41)
Albumin: 4.1 g/dL (ref 3.5–5.0)
Alkaline Phosphatase: 51 U/L (ref 38–126)
Bilirubin, Direct: <0.1 mg/dL (ref 0.0–0.2)
Total Bilirubin: 0.6 mg/dL (ref 0.3–1.2)
Total Protein: 7.7 g/dL (ref 6.5–8.1)

## 2022-07-03 LAB — BASIC METABOLIC PANEL
Anion gap: 11 (ref 5–15)
BUN: 8 mg/dL (ref 6–20)
CO2: 19 mmol/L — ABNORMAL LOW (ref 22–32)
Calcium: 9.2 mg/dL (ref 8.9–10.3)
Chloride: 105 mmol/L (ref 98–111)
Creatinine, Ser: 0.82 mg/dL (ref 0.44–1.00)
GFR, Estimated: >60 mL/min (ref >60)
Glucose, Bld: 159 mg/dL — ABNORMAL HIGH (ref 70–99)
Potassium: 3.3 mmol/L — ABNORMAL LOW (ref 3.5–5.1)
Sodium: 135 mmol/L (ref 135–145)

## 2022-07-03 LAB — TROPONIN I (HIGH SENSITIVITY)
Troponin I (High Sensitivity): <2 ng/L (ref <18)
Troponin I (High Sensitivity): <2 ng/L (ref <18)

## 2022-07-03 LAB — D-DIMER, QUANTITATIVE: D-Dimer, Quant: 0.38 ug/mL-FEU (ref 0.00–0.50)

## 2022-07-03 LAB — LIPASE, BLOOD: Lipase: 40 U/L (ref 11–51)

## 2022-07-03 MED ORDER — FAMOTIDINE 20 MG PO TABS: 20.0000 mg | ORAL_TABLET | Freq: Two times a day (BID) | ORAL | 0 refills | Status: DC

## 2022-07-03 MED ORDER — METOCLOPRAMIDE HCL 10 MG PO TABS: 10.0000 mg | ORAL_TABLET | Freq: Four times a day (QID) | ORAL | 0 refills | Status: DC | PRN

## 2022-07-03 MED ORDER — FAMOTIDINE 20 MG PO TABS
40.0000 mg | ORAL_TABLET | Freq: Once | ORAL | Status: AC
  Administered 2022-07-03: 40 mg via ORAL
  Filled 2022-07-03: qty 2

## 2022-07-03 MED ORDER — ALUMINUM-MAGNESIUM-SIMETHICONE 200-200-20 MG/5ML PO SUSP: 30.0000 mL | Freq: Three times a day (TID) | ORAL | 0 refills | Status: DC

## 2022-07-03 MED ORDER — ALUM & MAG HYDROXIDE-SIMETH 200-200-20 MG/5ML PO SUSP
30.0000 mL | Freq: Once | ORAL | Status: AC
  Administered 2022-07-03: 30 mL via ORAL
  Filled 2022-07-03: qty 30

## 2022-07-03 MED ORDER — METOCLOPRAMIDE HCL 10 MG PO TABS
10.0000 mg | ORAL_TABLET | Freq: Once | ORAL | Status: AC
  Administered 2022-07-03: 10 mg via ORAL
  Filled 2022-07-03: qty 1

## 2022-07-03 NOTE — ED Triage Notes (Signed)
Pt here with CP since Friday evening. Pt states that the pain varies in intensity but she has felt it consistently. Pt states the pain radiates to her back, jaw, and arm. Pt also nauseous but denies vomiting or diarrhea.

## 2022-07-03 NOTE — ED Notes (Signed)
First Nurse Note: Pt to ED via POV stating that since Friday she has been having chest pain, jaw pain, and headache. Pt is currently in NAD. Pt pulled by EDT for EKG.

## 2022-07-03 NOTE — ED Provider Notes (Signed)
[facility] Provider Note    Event Date/Time   First MD Initiated Contact with Patient 07/03/22 1041     (approximate)   History   Chief Complaint: Chest Pain   HPI  Anna Tapia is a 31 y.o. female with a history of CHF, VP shunt, asthma who comes the ED complaining of chest pain for the past 5 days, constant, no aggravating or alleviating factors.  Mostly feels like "indigestion," radiating up to the throat.  Also at times seems to radiate to the back and the left arm.  She has had nausea but no vomiting or diarrhea.  No diaphoresis palpitations or syncope.  No exertional chest pain.  Pain is not pleuritic.  She does report some associated shortness of breath.     Physical Exam   Triage Vital Signs: ED Triage Vitals  Enc Vitals Group     BP 07/03/22 0940 127/72     Pulse Rate 07/03/22 0940 (!) 102     Resp 07/03/22 0940 18     Temp 07/03/22 0940 98.5 F (36.9 C)     Temp Source 07/03/22 0940 Oral     SpO2 07/03/22 0940 100 %     Weight 07/03/22 0941 123 lb 14.4 oz (56.2 kg)     Height 07/03/22 0941 5' (1.524 m)     Head Circumference --      Peak Flow --      Pain Score 07/03/22 0941 8     Pain Loc --      Pain Edu? --      Excl. in [place]? --     Most recent vital signs: Vitals:   07/03/22 1158 07/03/22 1200  BP:  112/79  Pulse:  79  Resp:  12  Temp:    SpO2: 100% 98%    General: Awake, no distress.  CV:  Good peripheral perfusion.  Regular rate and rhythm, heart rate of 80. Resp:  Normal effort.  Clear to auscultation bilaterally.  No wheezes or crackles. Abd:  No distention.  Soft and nontender Other:  No lower extremity tenderness, no calf swelling.  Symmetric calf circumference.  Moist oral mucosa.   ED Results / Procedures / Treatments   Labs (all labs ordered are listed, but only abnormal results are displayed) Labs Reviewed  BASIC METABOLIC PANEL - Abnormal; Notable for the following components:      Result Value    Potassium 3.3 (*)    CO2 19 (*)    Glucose, Bld 159 (*)    All other components within normal limits  SARS CORONAVIRUS 2 BY RT PCR  CBC  HEPATIC FUNCTION PANEL  LIPASE, BLOOD  D-DIMER, QUANTITATIVE (NOT AT [facility])  POC URINE PREG, ED  TROPONIN I (HIGH SENSITIVITY)  TROPONIN I (HIGH SENSITIVITY)     EKG Interpreted by me Sinus tachycardia rate 108.  Normal axis and intervals.  Normal QRS and ST segments.  Isolated T wave inversion in aVF which is nonspecific.   Compared to prior EKG on November 10, 2020, no significant change  RADIOLOGY Chest x-ray interpreted by me, appears normal.  Radiology report reviewed   PROCEDURES:  .1-3 Lead EKG Interpretation  Performed by: Sharman Cheek, MD Authorized by: Sharman Cheek, MD     Interpretation: normal     ECG rate:  85   ECG rate assessment: normal     Rhythm: sinus rhythm     Ectopy: none     Conduction: normal  MEDICATIONS ORDERED IN ED: Medications  alum & mag hydroxide-simeth (MAALOX/MYLANTA) 200-200-20 MG/5ML suspension 30 mL (30 mLs Oral Given 07/03/22 1115)  famotidine (PEPCID) tablet 40 mg (40 mg Oral Given 07/03/22 1114)  metoCLOPramide (REGLAN) tablet 10 mg (10 mg Oral Given 07/03/22 1114)     IMPRESSION / MDM / ASSESSMENT AND PLAN / ED COURSE  I reviewed the triage vital signs and the nursing notes.                              Differential diagnosis includes, but is not limited to, GERD, musculoskeletal pain, non-STEMI, pulmonary embolism, viral illness, bronchitis  Patient's presentation is most consistent with acute presentation with potential threat to life or bodily function.  Patient presents with atypical chest pain for the past 5 days with normal exam.  Initial EKG is unremarkable, chest x-ray normal, troponin negative.  No further cardiac work-up indicated.  I am adding a D-dimer to risk stratify for PE or TAD which I think is unlikely.  ----------------------------------------- 12:47 PM  on 07/03/2022 ----------------------------------------- LFTs, lipase, D-dimer all normal.  Patient reports that her symptoms have resolved after antacids. Considering the patient's symptoms, medical history, and physical examination today, I have low suspicion for ACS, PE, TAD, pneumothorax, carditis, mediastinitis, pneumonia, CHF, or sepsis. Does not require admission, stable for discharge       FINAL CLINICAL IMPRESSION(S) / ED DIAGNOSES   Final diagnoses:  Atypical chest pain     Rx / DC Orders   ED Discharge Orders          Ordered    aluminum-magnesium hydroxide-simethicone (MAALOX) 200-200-20 MG/5ML SUSP  3 times daily before meals & bedtime        07/03/22 1247    famotidine (PEPCID) 20 MG tablet  2 times daily        07/03/22 1247    metoCLOPramide (REGLAN) 10 MG tablet  Every 6 hours PRN        07/03/22 1247             Note:  This document was prepared using Dragon voice recognition software and may include unintentional dictation errors.   Sharman Cheek, MD 07/03/22 1248

## 2022-07-24 ENCOUNTER — Encounter: Payer: Self-pay | Admitting: Emergency Medicine

## 2022-07-24 ENCOUNTER — Emergency Department: Payer: Medicaid Other

## 2022-07-24 ENCOUNTER — Emergency Department
Admission: EM | Admit: 2022-07-24 | Discharge: 2022-07-24 | Disposition: A | Discharge status: Other Acute Inpatient Hospital | Payer: Medicaid Other | Attending: Emergency Medicine | Admitting: Emergency Medicine

## 2022-07-24 ENCOUNTER — Other Ambulatory Visit: Payer: Self-pay

## 2022-07-24 DIAGNOSIS — R4781 Slurred speech: Secondary | ICD-10-CM | POA: Insufficient documentation

## 2022-07-24 DIAGNOSIS — J45909 Unspecified asthma, uncomplicated: Secondary | ICD-10-CM | POA: Insufficient documentation

## 2022-07-24 DIAGNOSIS — R531 Weakness: Secondary | ICD-10-CM | POA: Insufficient documentation

## 2022-07-24 DIAGNOSIS — Q282 Arteriovenous malformation of cerebral vessels: Secondary | ICD-10-CM | POA: Insufficient documentation

## 2022-07-24 DIAGNOSIS — Y93E1 Activity, personal bathing and showering: Secondary | ICD-10-CM | POA: Insufficient documentation

## 2022-07-24 DIAGNOSIS — I639 Cerebral infarction, unspecified: Secondary | ICD-10-CM | POA: Insufficient documentation

## 2022-07-24 DIAGNOSIS — I613 Nontraumatic intracerebral hemorrhage in brain stem: Secondary | ICD-10-CM

## 2022-07-24 DIAGNOSIS — W182XXA Fall in (into) shower or empty bathtub, initial encounter: Secondary | ICD-10-CM | POA: Insufficient documentation

## 2022-07-24 DIAGNOSIS — I629 Nontraumatic intracranial hemorrhage, unspecified: Secondary | ICD-10-CM | POA: Insufficient documentation

## 2022-07-24 DIAGNOSIS — I509 Heart failure, unspecified: Secondary | ICD-10-CM | POA: Insufficient documentation

## 2022-07-24 DIAGNOSIS — S06360A Traumatic hemorrhage of cerebrum, unspecified, without loss of consciousness, initial encounter: Secondary | ICD-10-CM | POA: Insufficient documentation

## 2022-07-24 DIAGNOSIS — Q283 Other malformations of cerebral vessels: Secondary | ICD-10-CM

## 2022-07-24 LAB — CBC
HCT: 38 % (ref 36.0–46.0)
Hemoglobin: 12.3 g/dL (ref 12.0–15.0)
MCH: 28.9 pg (ref 26.0–34.0)
MCHC: 32.4 g/dL (ref 30.0–36.0)
MCV: 89.4 fL (ref 80.0–100.0)
Platelets: 325 10*3/uL (ref 150–400)
RBC: 4.25 MIL/uL (ref 3.87–5.11)
RDW: 12.4 % (ref 11.5–15.5)
WBC: 9.8 10*3/uL (ref 4.0–10.5)
nRBC: 0 % (ref 0.0–0.2)

## 2022-07-24 LAB — DIFFERENTIAL
Abs Immature Granulocytes: 0.03 10*3/uL (ref 0.00–0.07)
Basophils Absolute: 0.1 10*3/uL (ref 0.0–0.1)
Basophils Relative: 1 %
Eosinophils Absolute: 0.1 10*3/uL (ref 0.0–0.5)
Eosinophils Relative: 1 %
Immature Granulocytes: 0 %
Lymphocytes Relative: 21 %
Lymphs Abs: 2 10*3/uL (ref 0.7–4.0)
Monocytes Absolute: 0.8 10*3/uL (ref 0.1–1.0)
Monocytes Relative: 8 %
Neutro Abs: 6.8 10*3/uL (ref 1.7–7.7)
Neutrophils Relative %: 69 %

## 2022-07-24 LAB — COMPREHENSIVE METABOLIC PANEL
ALT: 45 U/L — ABNORMAL HIGH (ref 0–44)
AST: 44 U/L — ABNORMAL HIGH (ref 15–41)
Albumin: 4.6 g/dL (ref 3.5–5.0)
Alkaline Phosphatase: 53 U/L (ref 38–126)
Anion gap: 8 (ref 5–15)
BUN: 13 mg/dL (ref 6–20)
CO2: 25 mmol/L (ref 22–32)
Calcium: 9.2 mg/dL (ref 8.9–10.3)
Chloride: 106 mmol/L (ref 98–111)
Creatinine, Ser: 0.7 mg/dL (ref 0.44–1.00)
GFR, Estimated: >60 mL/min (ref >60)
Glucose, Bld: 120 mg/dL — ABNORMAL HIGH (ref 70–99)
Potassium: 3.7 mmol/L (ref 3.5–5.1)
Sodium: 139 mmol/L (ref 135–145)
Total Bilirubin: 0.5 mg/dL (ref 0.3–1.2)
Total Protein: 8.1 g/dL (ref 6.5–8.1)

## 2022-07-24 LAB — ETHANOL: Alcohol, Ethyl (B): <10 mg/dL (ref <10)

## 2022-07-24 LAB — PROTIME-INR
INR: 1.1 (ref 0.8–1.2)
Prothrombin Time: 13.7 seconds (ref 11.4–15.2)

## 2022-07-24 LAB — APTT: aPTT: 28 seconds (ref 24–36)

## 2022-07-24 LAB — CBG MONITORING, ED: Glucose-Capillary: 125 mg/dL — ABNORMAL HIGH (ref 70–99)

## 2022-07-24 MED ORDER — SODIUM CHLORIDE 0.9% FLUSH
3.0000 mL | Freq: Once | INTRAVENOUS | Status: AC
  Administered 2022-07-24: 3 mL via INTRAVENOUS

## 2022-07-24 MED ORDER — IOHEXOL 350 MG/ML SOLN
75.0000 mL | Freq: Once | INTRAVENOUS | Status: AC | PRN
  Administered 2022-07-24: 75 mL via INTRAVENOUS

## 2022-07-24 NOTE — ED Notes (Signed)
[facility] flight crew at bedside, report given.  PT transferred to their care.

## 2022-07-24 NOTE — ED Notes (Signed)
[facility] refused pt at approx. 8:15pm

## 2022-07-24 NOTE — ED Provider Notes (Signed)
[facility] Provider Note    Event Date/Time   First MD Initiated Contact with Patient 07/24/22 1941     (approximate)   History   Code Stroke   HPI  Anna Tapia is a 31 y.o. female who fell in the shower at about 630.  She is now having slurry speech and right-sided weakness.  She has a history of aneurysms.  She looks to me like there is some hemorrhage.      Physical Exam   Triage Vital Signs: ED Triage Vitals [07/24/22 1924]  Enc Vitals Group     BP 134/83     Pulse Rate 94     Resp 18     Temp 98.1 F (36.7 C)     Temp Source Oral     SpO2 100 %     Weight      Height      Head Circumference      Peak Flow      Pain Score      Pain Loc      Pain Edu?      Excl. in [place]?     Most recent vital signs: Vitals:   07/24/22 1924 07/24/22 1948  BP: 134/83 138/76  Pulse: 94 83  Resp: 18 18  Temp: 98.1 F (36.7 C)   SpO2: 100% 100%     General: Awake, alert slurry speech CV:  Good peripheral perfusion.  Regular rate and rhythm no audible murmurs Resp:  Normal effort. \Lungs are clear Abd:  No distention.  Soft and nontender Neuro patient with slurry speech unable to lift right arm or leg off the bed.  Seems to have a little bit of a visual field cut as well difficult to tell right this minute   ED Results / Procedures / Treatments   Labs (all labs ordered are listed, but only abnormal results are displayed) Labs Reviewed  COMPREHENSIVE METABOLIC PANEL - Abnormal; Notable for the following components:      Result Value   Glucose, Bld 120 (*)    AST 44 (*)    ALT 45 (*)    All other components within normal limits  CBG MONITORING, ED - Abnormal; Notable for the following components:   Glucose-Capillary 125 (*)    All other components within normal limits  PROTIME-INR  APTT  CBC  DIFFERENTIAL  ETHANOL  I-STAT CREATININE, ED  POC URINE PREG, ED     EKG  EKG read and interpreted by me shows normal sinus  rhythm rate of 84 normal axis no acute ST-T changes.   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 No evidence of intraventricular extension.   PROCEDURES:  Critical Care performed: Critical care time at least 35 minutes.  This includes speaking to [facility] transfer center and [facility] transfer center and Dr. Marcell Barlow our neurosurgeon at least twice and are neurologist at least 3 times.  Additionally I examined the patient reviewed her's findings spoke with the radiologist about her CT and monitor her progress.  Procedures   MEDICATIONS ORDERED IN ED: Medications  sodium chloride flush (NS) 0.9 % injection 3 mL (3 mLs Intravenous Given 07/24/22 2029)  iohexol (OMNIPAQUE) 350 MG/ML injection 75 mL (75 mLs Intravenous Contrast Given 07/24/22 2019)     IMPRESSION / MDM / ASSESSMENT AND  PLAN / ED COURSE  I reviewed the triage vital signs and the nursing notes. Discussed the patient with Dr. Cari Caraway who will call the neuro people over at [facility] and give them a heads up.  I have already spoken with [facility] and waiting for them to have the stroke team call back.  We will talk to the [facility] transfer center as well and neurology is talking to [facility].  We do not do any intracranial coiling or gluing here.  We will keep the patient's blood pressure stable Dr. Cari Caraway advises no TXA or any other kind of clot enhancer  Patient's presentation is most consistent with acute presentation with potential threat to life or bodily function.  The patient is on the cardiac monitor to evaluate for evidence of arrhythmia and/or significant heart rate changes.None have been seen  Dr. Cari Caraway talk to Dr. Pattricia Boss at [facility].  He is happy to see the patient.  Transfer center has come through and the patient is excepted will go  ER to ER.  We will try to fly her there.  Patient has been stable.  She is on no medicines. FINAL CLINICAL IMPRESSION(S) / ED DIAGNOSES   Final diagnoses:  Intracranial hemorrhage ([place])     Rx / DC Orders   ED Discharge Orders     None        Note:  This document was prepared using Dragon voice recognition software and may include unintentional dictation errors.   Nena Polio, MD 07/24/22 2055

## 2022-07-24 NOTE — ED Notes (Signed)
[facility] called for transport spoke with Lennette Bihari

## 2022-07-24 NOTE — Progress Notes (Signed)
1938 Code Stroke activated, pt already back from CT. Slurred speech and R sided weakness and facial droop. H/O aneurysm. 1944 Dr Curly Shores paged [phone] Dr Curly Shores at bedside- pt has bleed on CT, will consult neurosurgery.

## 2022-07-24 NOTE — ED Notes (Signed)
Pt accepted to  [facility])

## 2022-07-24 NOTE — ED Notes (Signed)
Pt taken via [place] flight to [facility] for further care.

## 2022-07-24 NOTE — ED Notes (Signed)
CODE STROKE CALLED SPOKE WITH TAMMY

## 2022-07-24 NOTE — ED Triage Notes (Signed)
Pt in via POV, had unwitnessed fall while in shower at approximately 1830, denies LOC, states she does recall the fall but does not know what caused the fall.  Was found by family with right side weakness and brought directly to ER.  Patient presents with weakness to right arm and leg, slurred speech, right side facial droop.    Hx brain aneurysm.  A/Ox4 at this time; difficulty finding her words.  EDP, Malinda at bedside upon arrival from CT.

## 2022-07-24 NOTE — ED Notes (Signed)
Called [facility] transfer [facility]) per Cinda Quest, MD concerning transfer

## 2022-07-24 NOTE — Progress Notes (Signed)
   07/24/22 2100  Clinical Encounter Type  Visited With Patient and family together  Visit Type Initial  Referral From Nurse  Consult/Referral To Chaplain   Chaplain responded to code stroke. Chaplain provided compassionate presence and reflective listening as family members spoke of patient's health challenges. Chaplain provided space for family to share strong emotions about patient's current situation. Chaplain is available for follow up as needed.

## 2022-07-24 NOTE — ED Notes (Signed)
Spoke with Katrina of [employer].

## 2022-07-24 NOTE — ED Notes (Signed)
Called [facility] transfer [facility] Hammans) concerning transfer per Cinda Quest, MD

## 2022-07-24 NOTE — ED Notes (Signed)
Lynnell Dike, MD per Cinda Quest, MD at 7:53 pm and 8:05pm

## 2022-07-24 NOTE — ED Notes (Signed)
Report given to Sarah RN of [facility]

## 2022-07-24 NOTE — Consult Note (Signed)
Neurology Consultation Reason for Consult: Code stroke Requesting Physician: Conni Slipper  CC: Right-sided weakness  History is obtained from: Patient, chart review and family at bedside  HPI: Anna Tapia is a 31 y.o. 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  Per report from family she was showering at 1830 and had an unwitnessed fall.  She was subsequently found by family with right-sided weakness, confusion, gaze abnormality and brought to the ED by private vehicle  LKW: 1830 Thrombolytic given?: No, intracerebral hemorrhage IA performed?: No, intracerebral hemorrhage Premorbid modified rankin scale: 0-1     0 - No symptoms.     1 - No significant disability. Able to carry out all usual activities, despite some symptoms.  ICH Score 1 point for infratentorial location  ROS: Unable to obtain due to altered mental status.   Past Medical History:  Diagnosis Date   Asthma    Brain aneurysm    CHF (congestive heart failure) (HCC)    Past Surgical History:  Procedure Laterality Date   VENTRICULOPERITONEAL SHUNT     Family History  Problem Relation Age of Onset   Seizures Mother    Hypertension Mother    Diabetes Mother    Cancer Maternal Aunt    Cancer Maternal Uncle    Social History:  reports that she has quit smoking. Her smoking use included cigarettes. She has never used smokeless tobacco. She reports current alcohol use. She reports that she does not use drugs.   Exam: Current vital signs: BP 138/76 (BP Location: Left Arm)   Pulse 83   Temp 98.1 F (36.7 C) (Oral)   Resp 18   LMP 06/13/2022   SpO2 100%  Vital signs in last 24 hours: Temp:  [98.1 F (36.7 C)] 98.1 F (36.7 C) (09/20 1924) Pulse Rate:  [83-94] 83  (09/20 1948) Resp:  [18] 18 (09/20 1948) BP: (134-138)/(76-83) 138/76 (09/20 1948) SpO2:  [100 %] 100 % (09/20 1948)   Physical Exam  Constitutional: Appears well-developed and well-nourished.  Psych: Confused but calm and cooperative Eyes: No scleral injection HENT: No oropharyngeal obstruction.  MSK: no joint deformities.  Cardiovascular: Normal rate and regular rhythm. Perfusing extremities well Respiratory: Effort normal, non-labored breathing GI: Soft.  No distension. There is no tenderness.  Skin: Warm dry and intact visible skin  Neuro: Mental Status: Patient is slightly drowsy but following commands, disoriented to age reporting she is 49; cannot answer what month it is Patient is able to give limited history, unable to name her thumb replying "I do not know" Cranial Nerves: II: Visual Fields are full to orienting to stimuli in all 4 quadrants. Pupils are equal, round, and reactive to light sluggish 3 mm to 2 mm.   III,IV, VI: Right eye is medially deviated.  Left eye has more intact eye movements but both eyes exhibit significant direction changing nystagmus and patient reports a double vision  V: Facial sensation is symmetric to light touch VII: Facial movement is notable for right facial droop VIII: hearing is intact to voice Motor: Tone is normal. Bulk is normal.  Withdraws in the right upper and lower extremity to noxious stimulation.  No drift in the left upper and lower extremity Sensory: Reduced reaction to stimuli on the right but does respond to painful  stimulation Cerebellar: Finger-nose and heel-to-shin intact on the left, unable to perform on the right Gait:  Deferred in acute setting  . NIHSS total  Score breakdown: 12  I have reviewed labs in epic and the results pertinent to this consultation are:  Basic Metabolic Panel: Recent Labs  Lab 07/24/22 1948  NA 139  K 3.7  CL 106  CO2 25  GLUCOSE 120*  BUN 13  CREATININE 0.70  CALCIUM 9.2     CBC: Recent Labs  Lab 07/24/22 1948  WBC 9.8  NEUTROABS 6.8  HGB 12.3  HCT 38.0  MCV 89.4  PLT 325    Coagulation Studies: Recent Labs    07/24/22 1948  LABPROT 13.7  INR 1.1      I have reviewed the images obtained:  Head CT personally reviewed, agree with radiology: Acute intracranial hemorrhage in the left pons, midbrain, cerebral peduncle and thalamus, adjacent to and presumably related to a previously treated arteriovenous malformation. No evidence of intraventricular extension.  Impression: Intracerebral hemorrhage likely related to her AV malformation.  Recommendations: -Strict blood pressure control, systolic blood pressure less than 150 -Emergent neurosurgical consultation to tertiary care center familiar with vein of Galen malformations -Head of bed 30 degrees -CTA head and neck if patient can tolerate laying flat without significant increase in blood pressure or symptoms -Discussed with Dr. Cinda Quest in person  Lesleigh Noe MD-PhD [facility] [phone] [facility] coverage for [facility] is from 8 AM to 4 AM in-house and 4 PM to 8 PM by telephone/video. 8 PM to 8 AM emergent questions or overnight urgent questions should be addressed to Teleneurology On-call or [facility] neurohospitalist; contact information can be found on AMION   Total critical care time: 40 minutes   Critical care time was exclusive of separately billable procedures and treating other patients.   Critical care was necessary to treat or prevent imminent or life-threatening deterioration.   Critical care was time spent personally by me on the following activities: development of treatment plan with patient and/or surrogate as well as nursing, discussions with consultants/primary team, evaluation of patient's response to treatment, examination of patient, obtaining history from patient or surrogate, ordering and performing treatments and interventions, ordering  and review of laboratory studies, ordering and review of radiographic studies, and re-evaluation of patient's condition as needed, as documented above.

## 2022-07-24 NOTE — ED Notes (Signed)
Called [facility] transfer [facility] Hammans) for update

## 2022-08-01 DIAGNOSIS — I619 Nontraumatic intracerebral hemorrhage, unspecified: Secondary | ICD-10-CM | POA: Insufficient documentation

## 2022-10-04 DIAGNOSIS — Z419 Encounter for procedure for purposes other than remedying health state, unspecified: Secondary | ICD-10-CM | POA: Diagnosis not present

## 2022-10-06 DIAGNOSIS — M79601 Pain in right arm: Secondary | ICD-10-CM | POA: Diagnosis not present

## 2022-10-06 DIAGNOSIS — G459 Transient cerebral ischemic attack, unspecified: Secondary | ICD-10-CM | POA: Diagnosis not present

## 2022-10-06 DIAGNOSIS — Z8673 Personal history of transient ischemic attack (TIA), and cerebral infarction without residual deficits: Secondary | ICD-10-CM | POA: Diagnosis not present

## 2022-10-06 DIAGNOSIS — R531 Weakness: Secondary | ICD-10-CM | POA: Diagnosis not present

## 2022-10-06 DIAGNOSIS — R933 Abnormal findings on diagnostic imaging of other parts of digestive tract: Secondary | ICD-10-CM | POA: Diagnosis not present

## 2022-10-06 DIAGNOSIS — R29818 Other symptoms and signs involving the nervous system: Secondary | ICD-10-CM | POA: Diagnosis not present

## 2022-10-06 DIAGNOSIS — K625 Hemorrhage of anus and rectum: Secondary | ICD-10-CM | POA: Diagnosis not present

## 2022-10-06 DIAGNOSIS — R10819 Abdominal tenderness, unspecified site: Secondary | ICD-10-CM | POA: Diagnosis not present

## 2022-10-06 DIAGNOSIS — R29898 Other symptoms and signs involving the musculoskeletal system: Secondary | ICD-10-CM | POA: Diagnosis not present

## 2022-10-06 DIAGNOSIS — M79604 Pain in right leg: Secondary | ICD-10-CM | POA: Diagnosis not present

## 2022-10-06 DIAGNOSIS — Q282 Arteriovenous malformation of cerebral vessels: Secondary | ICD-10-CM | POA: Diagnosis not present

## 2022-10-06 DIAGNOSIS — F17200 Nicotine dependence, unspecified, uncomplicated: Secondary | ICD-10-CM | POA: Diagnosis not present

## 2022-10-06 DIAGNOSIS — R200 Anesthesia of skin: Secondary | ICD-10-CM | POA: Diagnosis not present

## 2022-10-06 DIAGNOSIS — R0989 Other specified symptoms and signs involving the circulatory and respiratory systems: Secondary | ICD-10-CM | POA: Diagnosis not present

## 2022-10-06 DIAGNOSIS — R109 Unspecified abdominal pain: Secondary | ICD-10-CM | POA: Diagnosis not present

## 2022-10-09 DIAGNOSIS — Z8673 Personal history of transient ischemic attack (TIA), and cerebral infarction without residual deficits: Secondary | ICD-10-CM | POA: Diagnosis not present

## 2022-10-14 DIAGNOSIS — F32A Depression, unspecified: Secondary | ICD-10-CM | POA: Diagnosis not present

## 2022-10-14 DIAGNOSIS — Q283 Other malformations of cerebral vessels: Secondary | ICD-10-CM | POA: Diagnosis not present

## 2022-10-14 DIAGNOSIS — I5022 Chronic systolic (congestive) heart failure: Secondary | ICD-10-CM | POA: Diagnosis not present

## 2022-10-14 DIAGNOSIS — F419 Anxiety disorder, unspecified: Secondary | ICD-10-CM | POA: Diagnosis not present

## 2022-10-14 DIAGNOSIS — I69151 Hemiplegia and hemiparesis following nontraumatic intracerebral hemorrhage affecting right dominant side: Secondary | ICD-10-CM | POA: Diagnosis not present

## 2022-10-14 DIAGNOSIS — K5900 Constipation, unspecified: Secondary | ICD-10-CM | POA: Diagnosis not present

## 2022-10-14 DIAGNOSIS — J45909 Unspecified asthma, uncomplicated: Secondary | ICD-10-CM | POA: Diagnosis not present

## 2022-10-18 DIAGNOSIS — F419 Anxiety disorder, unspecified: Secondary | ICD-10-CM | POA: Diagnosis not present

## 2022-10-18 DIAGNOSIS — K5900 Constipation, unspecified: Secondary | ICD-10-CM | POA: Diagnosis not present

## 2022-10-18 DIAGNOSIS — J45909 Unspecified asthma, uncomplicated: Secondary | ICD-10-CM | POA: Diagnosis not present

## 2022-10-18 DIAGNOSIS — Q283 Other malformations of cerebral vessels: Secondary | ICD-10-CM | POA: Diagnosis not present

## 2022-10-18 DIAGNOSIS — I5022 Chronic systolic (congestive) heart failure: Secondary | ICD-10-CM | POA: Diagnosis not present

## 2022-10-18 DIAGNOSIS — F32A Depression, unspecified: Secondary | ICD-10-CM | POA: Diagnosis not present

## 2022-10-18 DIAGNOSIS — I69151 Hemiplegia and hemiparesis following nontraumatic intracerebral hemorrhage affecting right dominant side: Secondary | ICD-10-CM | POA: Diagnosis not present

## 2022-10-22 DIAGNOSIS — K5900 Constipation, unspecified: Secondary | ICD-10-CM | POA: Diagnosis not present

## 2022-10-22 DIAGNOSIS — I5022 Chronic systolic (congestive) heart failure: Secondary | ICD-10-CM | POA: Diagnosis not present

## 2022-10-22 DIAGNOSIS — I69151 Hemiplegia and hemiparesis following nontraumatic intracerebral hemorrhage affecting right dominant side: Secondary | ICD-10-CM | POA: Diagnosis not present

## 2022-10-22 DIAGNOSIS — F32A Depression, unspecified: Secondary | ICD-10-CM | POA: Diagnosis not present

## 2022-10-22 DIAGNOSIS — Q283 Other malformations of cerebral vessels: Secondary | ICD-10-CM | POA: Diagnosis not present

## 2022-10-22 DIAGNOSIS — J45909 Unspecified asthma, uncomplicated: Secondary | ICD-10-CM | POA: Diagnosis not present

## 2022-10-22 DIAGNOSIS — F419 Anxiety disorder, unspecified: Secondary | ICD-10-CM | POA: Diagnosis not present

## 2022-10-24 DIAGNOSIS — F32A Depression, unspecified: Secondary | ICD-10-CM | POA: Diagnosis not present

## 2022-10-24 DIAGNOSIS — K5900 Constipation, unspecified: Secondary | ICD-10-CM | POA: Diagnosis not present

## 2022-10-24 DIAGNOSIS — J45909 Unspecified asthma, uncomplicated: Secondary | ICD-10-CM | POA: Diagnosis not present

## 2022-10-24 DIAGNOSIS — F419 Anxiety disorder, unspecified: Secondary | ICD-10-CM | POA: Diagnosis not present

## 2022-10-24 DIAGNOSIS — I69151 Hemiplegia and hemiparesis following nontraumatic intracerebral hemorrhage affecting right dominant side: Secondary | ICD-10-CM | POA: Diagnosis not present

## 2022-10-24 DIAGNOSIS — I5022 Chronic systolic (congestive) heart failure: Secondary | ICD-10-CM | POA: Diagnosis not present

## 2022-10-24 DIAGNOSIS — Q283 Other malformations of cerebral vessels: Secondary | ICD-10-CM | POA: Diagnosis not present

## 2022-10-29 DIAGNOSIS — K5900 Constipation, unspecified: Secondary | ICD-10-CM | POA: Diagnosis not present

## 2022-10-29 DIAGNOSIS — F32A Depression, unspecified: Secondary | ICD-10-CM | POA: Diagnosis not present

## 2022-10-29 DIAGNOSIS — Q283 Other malformations of cerebral vessels: Secondary | ICD-10-CM | POA: Diagnosis not present

## 2022-10-29 DIAGNOSIS — J45909 Unspecified asthma, uncomplicated: Secondary | ICD-10-CM | POA: Diagnosis not present

## 2022-10-29 DIAGNOSIS — F419 Anxiety disorder, unspecified: Secondary | ICD-10-CM | POA: Diagnosis not present

## 2022-10-29 DIAGNOSIS — I69151 Hemiplegia and hemiparesis following nontraumatic intracerebral hemorrhage affecting right dominant side: Secondary | ICD-10-CM | POA: Diagnosis not present

## 2022-10-29 DIAGNOSIS — I5022 Chronic systolic (congestive) heart failure: Secondary | ICD-10-CM | POA: Diagnosis not present

## 2022-11-04 DIAGNOSIS — Z419 Encounter for procedure for purposes other than remedying health state, unspecified: Secondary | ICD-10-CM | POA: Diagnosis not present

## 2022-11-07 DIAGNOSIS — Q283 Other malformations of cerebral vessels: Secondary | ICD-10-CM | POA: Diagnosis not present

## 2022-11-07 DIAGNOSIS — F419 Anxiety disorder, unspecified: Secondary | ICD-10-CM | POA: Diagnosis not present

## 2022-11-07 DIAGNOSIS — K5900 Constipation, unspecified: Secondary | ICD-10-CM | POA: Diagnosis not present

## 2022-11-07 DIAGNOSIS — I5022 Chronic systolic (congestive) heart failure: Secondary | ICD-10-CM | POA: Diagnosis not present

## 2022-11-07 DIAGNOSIS — J45909 Unspecified asthma, uncomplicated: Secondary | ICD-10-CM | POA: Diagnosis not present

## 2022-11-07 DIAGNOSIS — F32A Depression, unspecified: Secondary | ICD-10-CM | POA: Diagnosis not present

## 2022-11-07 DIAGNOSIS — I69151 Hemiplegia and hemiparesis following nontraumatic intracerebral hemorrhage affecting right dominant side: Secondary | ICD-10-CM | POA: Diagnosis not present

## 2022-11-14 DIAGNOSIS — Q283 Other malformations of cerebral vessels: Secondary | ICD-10-CM | POA: Diagnosis not present

## 2022-11-14 DIAGNOSIS — F419 Anxiety disorder, unspecified: Secondary | ICD-10-CM | POA: Diagnosis not present

## 2022-11-14 DIAGNOSIS — J45909 Unspecified asthma, uncomplicated: Secondary | ICD-10-CM | POA: Diagnosis not present

## 2022-11-14 DIAGNOSIS — I69151 Hemiplegia and hemiparesis following nontraumatic intracerebral hemorrhage affecting right dominant side: Secondary | ICD-10-CM | POA: Diagnosis not present

## 2022-11-14 DIAGNOSIS — K5900 Constipation, unspecified: Secondary | ICD-10-CM | POA: Diagnosis not present

## 2022-11-14 DIAGNOSIS — I5022 Chronic systolic (congestive) heart failure: Secondary | ICD-10-CM | POA: Diagnosis not present

## 2022-11-14 DIAGNOSIS — F32A Depression, unspecified: Secondary | ICD-10-CM | POA: Diagnosis not present

## 2022-11-19 ENCOUNTER — Encounter: Payer: Self-pay | Admitting: Internal Medicine

## 2022-11-19 ENCOUNTER — Ambulatory Visit: Payer: Medicaid Other | Admitting: Internal Medicine

## 2022-11-19 VITALS — BP 120/82 | HR 99 | Temp 98.2°F | Resp 16 | Ht 60.0 in | Wt 141.1 lb

## 2022-11-19 DIAGNOSIS — Z23 Encounter for immunization: Secondary | ICD-10-CM

## 2022-11-19 DIAGNOSIS — K219 Gastro-esophageal reflux disease without esophagitis: Secondary | ICD-10-CM | POA: Diagnosis not present

## 2022-11-19 DIAGNOSIS — I509 Heart failure, unspecified: Secondary | ICD-10-CM

## 2022-11-19 DIAGNOSIS — I6930 Unspecified sequelae of cerebral infarction: Secondary | ICD-10-CM

## 2022-11-19 DIAGNOSIS — J4520 Mild intermittent asthma, uncomplicated: Secondary | ICD-10-CM | POA: Diagnosis not present

## 2022-11-19 MED ORDER — FLUTICASONE-SALMETEROL 100-50 MCG/ACT IN AEPB: 1.0000 | INHALATION_SPRAY | Freq: Two times a day (BID) | RESPIRATORY_TRACT | 3 refills | Status: DC

## 2022-11-19 MED ORDER — BACLOFEN 10 MG PO TABS: 10.0000 mg | ORAL_TABLET | Freq: Two times a day (BID) | ORAL | 3 refills | Status: DC

## 2022-11-19 NOTE — Patient Instructions (Signed)
It was great seeing you today!  Plan discussed at today's visit: -Blood work ordered today, results will be uploaded to [facility].  -Referral to Cardiology and PT  -Baclofen refilled and Advair sent as well  -Flu and Tdap vaccines given   Follow up in: 3 months   Take care and let us know if you have any questions or concerns prior to your next visit.  Dr. Rosana Berger

## 2022-11-19 NOTE — Progress Notes (Signed)
New Patient Office Visit  Subjective    Patient ID: Anna Tapia, female    DOB: 1991/03/07  Age: 32 y.o. MRN: [id number]  CC:  Chief Complaint  Patient presents with   Establish Care    [facility] cardiology, neurologist   Medication Refill    bacoflen    HPI GLENICE CICCONE presents to establish care.  She is here with her mom.   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  Hx of CHF as a newborn: -Had been following with Cardiology at [facility], last seen in 2018 -Last echo 9/23 EF 58% -Denies chest pain, palpitations, shortness of breath or lower extremity swelling  Asthma:  -Asthma status: worse -Current Treatments: Nothing currently, had been on Advair in the past but does not have an active prescription -Satisfied with current treatment?: no -Dyspnea frequency: worsening, especially with activity  -Wheezing frequency: none -Cough frequency: a little  -Nocturnal symptom frequency: none  -Limitation of activity: yes -Current upper respiratory symptoms: no -Triggers: cold air  -Pneumovax: unknown -Influenza: Not up to Date  GERD: -Currently on Pepcid 20 mg PRN  Health Maintenance: -Blood work due -Flu and tdap due  Outpatient Encounter Medications as of 11/19/2022  Medication Sig   acetaminophen (TYLENOL) 325 MG tablet Take 650 mg by mouth every 6 (six) hours as needed.   dibucaine (NUPERCAINAL) 1 % OINT Place 1 application  rectally 3 (three) times daily as needed for hemorrhoids.   famotidine (PEPCID) 20 MG tablet  Take 1 tablet (20 mg total) by mouth 2 (two) times daily.   pregabalin (LYRICA) 25 MG capsule Take 25 mg by mouth 2 (two) times daily.   baclofen (LIORESAL) 10 MG tablet Take 10 mg by mouth 2 (two) times daily. (Patient not taking: Reported on 11/19/2022)   [DISCONTINUED] aluminum-magnesium hydroxide-simethicone (MAALOX) 102-725-36 MG/5ML SUSP Take 30 mLs by mouth 4 (four) times daily -  before meals and at bedtime.   [DISCONTINUED] metoCLOPramide (REGLAN) 10 MG tablet Take 1 tablet (10 mg total) by mouth every 6 (six) hours as needed.   No facility-administered encounter medications on file as of 11/19/2022.    Past Medical History:  Diagnosis Date   Asthma    Brain aneurysm    CHF (congestive heart failure) (HCC)    Stroke [facility])     Past Surgical History:  Procedure Laterality Date   VENTRICULOPERITONEAL SHUNT      Family History  Problem Relation Age of Onset   Seizures Mother    Hypertension Mother    Diabetes Mother    Cancer Maternal Aunt    Cancer Maternal Uncle     Social History   Socioeconomic History   Marital status: Single    Spouse name: Not on file   Number of children: Not on file   Years of education: Not on file   Highest education level: Not on file  Occupational History   Not on file  Tobacco Use   Smoking status: Former    Types: Cigarettes   Smokeless tobacco: Never  Vaping Use  Vaping Use: Former   Substances: Nicotine, Flavoring  Substance and Sexual Activity   Alcohol use: Yes    Comment: Rare   Drug use: Never   Sexual activity: Yes    Partners: Male    Birth control/protection: Condom  Other Topics Concern   Not on file  Social History Narrative   Not on file   Social Determinants of Health   Financial Resource Strain: Not on file  Food Insecurity: Not on file  Transportation Needs: Not on file  Physical Activity: Not on file  Stress: Not on file  Social Connections: Not on file  Intimate Partner Violence: Not At Risk  (01/21/2022)   Humiliation, Afraid, Rape, and Kick questionnaire    Fear of Current or Ex-Partner: No    Emotionally Abused: No    Physically Abused: No    Sexually Abused: No    Review of Systems  Constitutional:  Negative for chills and fever.  Eyes:  Negative for blurred vision.  Respiratory:  Positive for shortness of breath. Negative for cough and wheezing.   Cardiovascular:  Negative for chest pain, palpitations and leg swelling.  Neurological:  Positive for sensory change and focal weakness.        Objective    BP 120/82   Pulse 99   Temp 98.2 F (36.8 C)   Resp 16   Ht 5' (1.524 m)   Wt 141 lb 1.6 oz (64 kg)   LMP 11/06/2022   SpO2 100%   BMI 27.56 kg/m   Physical Exam Constitutional:      Appearance: Normal appearance.  HENT:     Head: Normocephalic and atraumatic.     Mouth/Throat:     Mouth: Mucous membranes are moist.     Pharynx: Oropharynx is clear.  Eyes:     Conjunctiva/sclera: Conjunctivae normal.  Cardiovascular:     Rate and Rhythm: Normal rate and regular rhythm.  Pulmonary:     Effort: Pulmonary effort is normal.     Breath sounds: Normal breath sounds.  Musculoskeletal:     Right lower leg: No edema.     Left lower leg: No edema.  Skin:    General: Skin is warm and dry.  Neurological:     Mental Status: She is alert. Mental status is at baseline.  Psychiatric:        Mood and Affect: Mood normal.        Behavior: Behavior normal.         Assessment & Plan:   1. History of CVA with residual deficit: Potassium low on last set of labs from December, recheck CMP, CBC and lipid panel today.  Patient is fasting.  Patient is currently using baclofen 10 mg twice daily for right-sided deficits as well as Lyrica 25 mg twice daily for sensory changes.  Baclofen will be refilled today.  Referral to physical therapy will also be placed today.  Patient is following with neurology, telemedicine note from 10/09/2022 reviewed.  Follow-up in 3  months.  - COMPLETE METABOLIC PANEL WITH GFR - CBC w/Diff/Platelet - Lipid Profile - Ambulatory referral to Physical Therapy - baclofen (LIORESAL) 10 MG tablet; Take 1 tablet (10 mg total) by mouth 2 (two) times daily.  Dispense: 30 each; Refill: 3  2. Chronic congestive heart failure, unspecified heart failure type [facility]): Echo from the hospital normal, however patient has had congestive heart failure from infancy and should be established with cardiology for annual evaluations.  Referral placed.  - Ambulatory  referral to Cardiology  3. Intermittent asthma without complication, unspecified asthma severity: Uncontrolled, restart Advair.  - fluticasone-salmeterol (ADVAIR) 100-50 MCG/ACT AEPB; Inhale 1 puff into the lungs 2 (two) times daily.  Dispense: 1 each; Refill: 3  4. Gastroesophageal reflux disease, unspecified whether esophagitis present: Symptoms stable on Pepcid as needed.  5. Need for diphtheria-tetanus-pertussis (Tdap) vaccine/Need for influenza vaccination: Tetanus and flu vaccines administered today.  - Tdap vaccine greater than or equal to 7yo IM - Flu Vaccine QUAD 6+ mos PF IM (Fluarix Quad PF)   Return in about 3 months (around 02/18/2023).   Margarita Mail, DO

## 2022-11-20 LAB — COMPLETE METABOLIC PANEL WITH GFR
AG Ratio: 1.3 (calc) (ref 1.0–2.5)
ALT: 13 U/L (ref 6–29)
AST: 17 U/L (ref 10–30)
Albumin: 4.3 g/dL (ref 3.6–5.1)
Alkaline phosphatase (APISO): 53 U/L (ref 31–125)
BUN: 7 mg/dL (ref 7–25)
CO2: 26 mmol/L (ref 20–32)
Calcium: 9.3 mg/dL (ref 8.6–10.2)
Chloride: 105 mmol/L (ref 98–110)
Creat: 0.71 mg/dL (ref 0.50–0.97)
Globulin: 3.2 g/dL (calc) (ref 1.9–3.7)
Glucose, Bld: 76 mg/dL (ref 65–99)
Potassium: 4.5 mmol/L (ref 3.5–5.3)
Sodium: 140 mmol/L (ref 135–146)
Total Bilirubin: 0.3 mg/dL (ref 0.2–1.2)
Total Protein: 7.5 g/dL (ref 6.1–8.1)
eGFR: 116 mL/min/{1.73_m2} (ref >=60)

## 2022-11-20 LAB — CBC WITH DIFFERENTIAL/PLATELET
Absolute Monocytes: 708 cells/uL (ref 200–950)
Basophils Absolute: 43 cells/uL (ref 0–200)
Basophils Relative: 0.7 %
Eosinophils Absolute: 92 cells/uL (ref 15–500)
Eosinophils Relative: 1.5 %
HCT: 39.7 % (ref 35.0–45.0)
Hemoglobin: 13 g/dL (ref 11.7–15.5)
Lymphs Abs: 1958 cells/uL (ref 850–3900)
MCH: 28.8 pg (ref 27.0–33.0)
MCHC: 32.7 g/dL (ref 32.0–36.0)
MCV: 87.8 fL (ref 80.0–100.0)
MPV: 9.7 fL (ref 7.5–12.5)
Monocytes Relative: 11.6 %
Neutro Abs: 3300 cells/uL (ref 1500–7800)
Neutrophils Relative %: 54.1 %
Platelets: 415 10*3/uL — ABNORMAL HIGH (ref 140–400)
RBC: 4.52 10*6/uL (ref 3.80–5.10)
RDW: 11.7 % (ref 11.0–15.0)
Total Lymphocyte: 32.1 %
WBC: 6.1 10*3/uL (ref 3.8–10.8)

## 2022-11-20 LAB — LIPID PANEL
Cholesterol: 206 mg/dL — ABNORMAL HIGH (ref <200)
HDL: 70 mg/dL (ref >=50)
LDL Cholesterol (Calc): 121 mg/dL (calc) — ABNORMAL HIGH
Non-HDL Cholesterol (Calc): 136 mg/dL (calc) — ABNORMAL HIGH (ref <130)
Total CHOL/HDL Ratio: 2.9 (calc) (ref <5.0)
Triglycerides: 63 mg/dL (ref <150)

## 2022-11-21 DIAGNOSIS — Z1331 Encounter for screening for depression: Secondary | ICD-10-CM | POA: Diagnosis not present

## 2022-11-21 DIAGNOSIS — Z8679 Personal history of other diseases of the circulatory system: Secondary | ICD-10-CM | POA: Diagnosis not present

## 2022-11-21 DIAGNOSIS — R202 Paresthesia of skin: Secondary | ICD-10-CM | POA: Diagnosis not present

## 2022-11-21 DIAGNOSIS — Z8774 Personal history of (corrected) congenital malformations of heart and circulatory system: Secondary | ICD-10-CM | POA: Diagnosis not present

## 2022-11-28 DIAGNOSIS — Z87891 Personal history of nicotine dependence: Secondary | ICD-10-CM | POA: Diagnosis not present

## 2022-11-28 DIAGNOSIS — Z48812 Encounter for surgical aftercare following surgery on the circulatory system: Secondary | ICD-10-CM | POA: Diagnosis not present

## 2022-11-28 DIAGNOSIS — Z8679 Personal history of other diseases of the circulatory system: Secondary | ICD-10-CM | POA: Diagnosis not present

## 2022-11-28 DIAGNOSIS — Z09 Encounter for follow-up examination after completed treatment for conditions other than malignant neoplasm: Secondary | ICD-10-CM | POA: Diagnosis not present

## 2022-11-28 DIAGNOSIS — Z8673 Personal history of transient ischemic attack (TIA), and cerebral infarction without residual deficits: Secondary | ICD-10-CM | POA: Diagnosis not present

## 2022-12-03 DIAGNOSIS — I6781 Acute cerebrovascular insufficiency: Secondary | ICD-10-CM | POA: Diagnosis not present

## 2022-12-05 DIAGNOSIS — Z419 Encounter for procedure for purposes other than remedying health state, unspecified: Secondary | ICD-10-CM | POA: Diagnosis not present

## 2022-12-26 ENCOUNTER — Ambulatory Visit: Payer: Medicaid Other | Attending: Internal Medicine

## 2022-12-26 DIAGNOSIS — M25611 Stiffness of right shoulder, not elsewhere classified: Secondary | ICD-10-CM | POA: Insufficient documentation

## 2022-12-26 DIAGNOSIS — M6281 Muscle weakness (generalized): Secondary | ICD-10-CM | POA: Diagnosis not present

## 2022-12-26 DIAGNOSIS — I6930 Unspecified sequelae of cerebral infarction: Secondary | ICD-10-CM | POA: Diagnosis not present

## 2022-12-26 DIAGNOSIS — M25561 Pain in right knee: Secondary | ICD-10-CM

## 2022-12-26 DIAGNOSIS — R2689 Other abnormalities of gait and mobility: Secondary | ICD-10-CM

## 2022-12-26 DIAGNOSIS — M25511 Pain in right shoulder: Secondary | ICD-10-CM

## 2022-12-26 DIAGNOSIS — R262 Difficulty in walking, not elsewhere classified: Secondary | ICD-10-CM | POA: Diagnosis not present

## 2022-12-26 DIAGNOSIS — M25661 Stiffness of right knee, not elsewhere classified: Secondary | ICD-10-CM | POA: Diagnosis not present

## 2022-12-26 DIAGNOSIS — R2681 Unsteadiness on feet: Secondary | ICD-10-CM | POA: Diagnosis not present

## 2022-12-26 NOTE — Therapy (Addendum)
OUTPATIENT PHYSICAL THERAPY NEURO EVALUATION   Patient Name: Anna Tapia MRN: [id number] DOB:03-Aug-1991, 32 y.o., female Today's Date: 01/02/2023   PCP: Teodora Medici, DO  REFERRING PROVIDER: Teodora Medici, DO   END OF SESSION:  PT End of Session - 12/26/22 1449     Visit Number 1    Number of Visits 24    Date for PT Re-Evaluation 03/20/23    Authorization Type [place] MEDICA[employer] PREPAID HEALTH PLAN    PT Start Time [id number]    PT Stop Time 1530    PT Time Calculation (min) 48 min    Equipment Utilized During Treatment Gait belt    Activity Tolerance Patient tolerated treatment well    Behavior During Therapy WFL for tasks assessed/performed             Past Medical History:  Diagnosis Date   Asthma    Brain aneurysm    CHF (congestive heart failure) ([place])    Stroke [facility])    Past Surgical History:  Procedure Laterality Date   VENTRICULOPERITONEAL SHUNT     Patient Active Problem List   Diagnosis Date Noted   Major depressive disorder, recurrent episode, moderate ([place]) 02/07/2022   Sepsis secondary to UTI ([place]) 05/01/2020   VP (ventriculoperitoneal) shunt status 05/01/2020   Right ovarian cyst 05/01/2020   Elevated LFTs 05/01/2020   Vaginal spotting 05/01/2020    ONSET DATE: 07/24/2022  REFERRING DIAG: I69.30 (ICD-10-CM) - History of CVA with residual deficit  THERAPY DIAG:  Muscle weakness (generalized) - Plan: PT plan of care cert/re-cert  Difficulty in walking, not elsewhere classified  Balance disorder  Other abnormalities of gait and mobility  Unsteadiness on feet  Rationale for Evaluation and Treatment: Rehabilitation  SUBJECTIVE:                                                                                                                                                                                             SUBJECTIVE STATEMENT:  Pt had a stroke and came to [facility], but it was found that she was bleeding on her brain, so  she was life-lined to [facility].  Pt received treatment there and had surgery to stop the bleeding.  Pt did have an angiogram last month as well.    Pt reports that her aunt saved her life because the pt just wanted to go to sleep, but she told her she had to go to the hospital to be checked out.   Pt reports she has pain in the whole R side of her body.    Pt accompanied by: family member  PERTINENT HISTORY:  Per MD report on 11/19/22:  Hx of left-sided mid-brain and thalamic ICH in 9/23: -Also history of known vein of galen malformation s/p embolization of PCA and ACA feeders 07/29/22 with history of partial embolization as an infant in [place] with Neurology, last seen 10/09/22 -Does have residual right sided deficits  -Currently on Baclofen 10 mg BID, Lyrica 25 mg BID -Difficulty using using right hand and right side of face - completed home PT/OT but interested in continuing with therapy outside the home.  PAIN:  Are you having pain? Yes: NPRS scale: 7/10 Pain location: R shoulder and R knee Pain description: Achey feeling Aggravating factors: Moving it and when she first wakes up. Relieving factors: Nothing that she has found  PRECAUTIONS: None  WEIGHT BEARING RESTRICTIONS: No  FALLS: Has patient fallen in last 6 months? Yes. Number of falls 1, during the stroke and fell in the shower.  LIVING ENVIRONMENT: Lives with:  mother Lives in: House/apartment Stairs: Yes: External: 3 steps; can reach both Has following equipment at home: Gilford Rile - 4 wheeled, pt reports that she does not like to use the rollator.  PLOF: Independent  PATIENT GOALS: Get more mobility in the arm and less pain in the leg.  OBJECTIVE:   DIAGNOSTIC FINDINGS:    CLINICAL DATA:  Initial evaluation for intracranial hemorrhage.   EXAM: CT ANGIOGRAPHY HEAD AND NECK   IMPRESSION: 1. 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 Again,  prominent arterial contribution to the AVM is seen from the left ACA and posterior circulation, with primary venous drainage into the deep venous system. Associated 7 mm aneurysm along the right anterolateral aspect of the AVM as above. 2. Diffuse tortuosity and ectasia elsewhere about the major arterial vasculature of the head and neck. No large vessel occlusion or hemodynamically significant stenosis. No other acute vascular abnormality.  COGNITION: Overall cognitive status: Within functional limits for tasks assessed   SENSATION: WFL  COORDINATION: Pt limited with quick movements during supination/pronation of the wrist on the R side and also with thumb to finger movements.  POSTURE: rounded shoulders  LOWER EXTREMITY ROM:     Active  Right Eval Left Eval  Hip flexion    Hip extension    Hip abduction    Hip adduction    Hip internal rotation    Hip external rotation    Knee flexion    Knee extension    Ankle dorsiflexion    Ankle plantarflexion    Ankle inversion    Ankle eversion     (Blank rows = not tested)  LOWER EXTREMITY MMT:    MMT Right Eval Left Eval  Hip flexion 4 4+  Hip abduction 4 4+  Hip adduction 4- 4-  Knee flexion 4- 4+  Knee extension 4 4+  Ankle dorsiflexion 4 4+  (Blank rows = not tested)   TRANSFERS: Assistive device utilized: None  Sit to stand: Complete Independence Stand to sit: Complete Independence Chair to chair: Complete Independence  GAIT: Gait pattern: step through pattern, decreased arm swing- Right, decreased step length- Left, decreased stance time- Right, decreased stride length, decreased hip/knee flexion- Right, decreased ankle dorsiflexion- Right, and circumduction- Right Distance walked: 40' Assistive device utilized: None Level of assistance: Complete Independence Comments: Pt able to ambulate safely without AD.  FUNCTIONAL TESTS:  5 times sit to stand: 15.44 sec Timed up and go (TUG): 15.65 sec 6 minute  walk test: Not performed  at initial evaluation 10 meter walk test: 15.46 sec Functional gait assessment: Not performed at initial evaluation  PATIENT SURVEYS:  FOTO 48/58  TODAY'S TREATMENT:                                                                                                                              DATE: 01/02/23   Eval Only  MHP applied to knee due to pt reporting increased pain in the R knee (not billed)  PATIENT EDUCATION: Education details: Pt educated on role of PT and services provided during current POC, along with prognosis and information about the clinic. Person educated: Patient and aunt. Education method: Explanation Education comprehension: verbalized understanding  HOME EXERCISE PROGRAM:  Deferred to next visit.  GOALS: Goals reviewed with patient? Yes  SHORT TERM GOALS: Target date: 01/23/2023  Pt will be independent with HEP in order to demonstrate increased ability to perform tasks related to occupation/hobbies. Baseline: Pt not given HEP at initial evaluation. Goal status: INITIAL   LONG TERM GOALS: Target date: 03/20/2023  1.  Patient (> 57 years old) will complete five times sit to stand test in < 15 seconds indicating an increased LE strength and improved balance. Baseline: 15.44 sec Goal status: INITIAL  2.  Patient will increase FOTO score to equal to or greater than 58 to demonstrate statistically significant improvement in mobility and quality of life.  Baseline: 48 Goal status: INITIAL   3.  Patient will increase FGA score by > 4 points to demonstrate decreased fall risk during functional activities. Baseline: Not performed at initial evaluation Goal status: INITIAL   4.  Patient will reduce timed up and go to <11 seconds to reduce fall risk and demonstrate improved transfer/gait ability. Baseline: 15.65 sec Goal status: INITIAL  5.  Patient will increase 10 meter walk test to >1.56ms as to improve gait speed for better  community ambulation and to reduce fall risk. Baseline: 15.46 sec Goal status: INITIAL  6.  Patient will increase six minute walk test distance to >1000 for progression to community ambulator and improve gait ability Baseline: Not performed at initial evaluation Goal status: INITIAL    ASSESSMENT:  CLINICAL IMPRESSION: Patient is a 32y.o. female who was seen today for physical therapy evaluation and treatment for history of CVA with residual deficit.  Prior to CVA, pt was independent without any deficits with walking or any other activity.  Pt presents with physical impairments of decreased activity tolerance, decreased ROM of the R UE and R LE,  increased pain in R shoulder and R knee, and decreased strength in the R side of the body as noted above.  Pt will benefit from skilled therapy to address tolerance, ROM, pain, and strength impairments necessary for improvement in quality of life.  Pt. demonstrates understanding of this plan of care and agrees with this plan.   OBJECTIVE IMPAIRMENTS: Abnormal gait, decreased activity tolerance, decreased balance, decreased endurance, decreased mobility, difficulty  walking, decreased ROM, decreased strength, and pain.   ACTIVITY LIMITATIONS: carrying, lifting, bending, standing, squatting, sleeping, bathing, toileting, reach over head, hygiene/grooming, and locomotion level  PARTICIPATION LIMITATIONS: meal prep, cleaning, laundry, driving, shopping, community activity, and yard work  PERSONAL FACTORS: Age, Past/current experiences, Social background, Time since onset of injury/illness/exacerbation, Transportation, and 3+ comorbidities: asthma, depression, CHF  are also affecting patient's functional outcome.   REHAB POTENTIAL: Good  CLINICAL DECISION MAKING: Stable/uncomplicated  EVALUATION COMPLEXITY: Moderate  PLAN:  PT FREQUENCY: 1-2x/week, preferably 2x/week but due to insurance, may only be able to be seen 1x/week  PT DURATION: 12  weeks  PLANNED INTERVENTIONS: Therapeutic exercises, Therapeutic activity, Neuromuscular re-education, Balance training, Gait training, Patient/Family education, Self Care, Joint mobilization, Joint manipulation, Stair training, Vestibular training, Canalith repositioning, Orthotic/Fit training, DME instructions, Aquatic Therapy, Dry Needling, Electrical stimulation, Spinal manipulation, Spinal mobilization, Cryotherapy, Moist heat, Taping, Manual therapy, and Re-evaluation  PLAN FOR NEXT SESSION:   Assess 6MWT, FGA.  Continue with balance and LE strengthening program.  See if pt has received confirmation on OT evaluation and potential scheduling changes.      Gwenlyn Saran, PT, DPT Physical Therapist - [facility]  01/02/23, 5:03 PM

## 2023-01-02 ENCOUNTER — Ambulatory Visit: Payer: Medicaid Other

## 2023-01-02 DIAGNOSIS — M6281 Muscle weakness (generalized): Secondary | ICD-10-CM

## 2023-01-02 DIAGNOSIS — Z7689 Persons encountering health services in other specified circumstances: Secondary | ICD-10-CM | POA: Diagnosis not present

## 2023-01-02 DIAGNOSIS — I6930 Unspecified sequelae of cerebral infarction: Secondary | ICD-10-CM | POA: Diagnosis not present

## 2023-01-02 DIAGNOSIS — M25511 Pain in right shoulder: Secondary | ICD-10-CM | POA: Diagnosis not present

## 2023-01-02 DIAGNOSIS — R262 Difficulty in walking, not elsewhere classified: Secondary | ICD-10-CM | POA: Diagnosis not present

## 2023-01-02 DIAGNOSIS — M25611 Stiffness of right shoulder, not elsewhere classified: Secondary | ICD-10-CM | POA: Diagnosis not present

## 2023-01-02 DIAGNOSIS — M25661 Stiffness of right knee, not elsewhere classified: Secondary | ICD-10-CM | POA: Diagnosis not present

## 2023-01-02 DIAGNOSIS — R2681 Unsteadiness on feet: Secondary | ICD-10-CM | POA: Diagnosis not present

## 2023-01-02 DIAGNOSIS — M25561 Pain in right knee: Secondary | ICD-10-CM | POA: Diagnosis not present

## 2023-01-02 DIAGNOSIS — R2689 Other abnormalities of gait and mobility: Secondary | ICD-10-CM | POA: Diagnosis not present

## 2023-01-02 NOTE — Therapy (Addendum)
OUTPATIENT PHYSICAL THERAPY NEURO EVALUATION   Patient Name: Anna Tapia MRN: [id number] DOB:[birth year], 32 y.o., female Today's Date: 01/02/2023   PCP: Teodora Medici, DO  REFERRING PROVIDER: Teodora Medici, DO   END OF SESSION:  PT End of Session - 12/26/22 1449     Visit Number 2    Number of Visits 24    Date for PT Re-Evaluation 03/20/23    Authorization Type [place] MEDICAID PREPAID HEALTH PLAN    PT Start Time 1442    PT Stop Time 1530    PT Time Calculation (min) 48 min    Equipment Utilized During Treatment Gait belt    Activity Tolerance Patient tolerated treatment well    Behavior During Therapy WFL for tasks assessed/performed             Past Medical History:  Diagnosis Date   Asthma    Brain aneurysm    CHF (congestive heart failure) ([place])    Stroke [facility])    Past Surgical History:  Procedure Laterality Date   VENTRICULOPERITONEAL SHUNT     Patient Active Problem List   Diagnosis Date Noted   Major depressive disorder, recurrent episode, moderate ([place]) 02/07/2022   Sepsis secondary to UTI ([place]) 05/01/2020   VP (ventriculoperitoneal) shunt status 05/01/2020   Right ovarian cyst 05/01/2020   Elevated LFTs 05/01/2020   Vaginal spotting 05/01/2020    ONSET DATE: 07/24/2022  REFERRING DIAG: I69.30 (ICD-10-CM) - History of CVA with residual deficit  THERAPY DIAG:  No diagnosis found.  Rationale for Evaluation and Treatment: Rehabilitation  SUBJECTIVE:                                                                                                                                                                                             SUBJECTIVE STATEMENT:  Pt reports she has not done much of anything since evaluation.  Pt state she is experiencing 4/10 pain in the R shoulder.   Pt reports she has pain in the whole R side of her body.    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  PAIN:  Are you having pain? Yes: NPRS scale: 7/10 Pain location: R shoulder and R knee Pain description: Achey feeling Aggravating factors: Moving it and when she first wakes up. Relieving factors: Nothing that she has found  PRECAUTIONS: None  WEIGHT BEARING RESTRICTIONS: No  FALLS: Has patient fallen in last 6 months? Yes. Number of falls 1, during the stroke and fell in the shower.  LIVING ENVIRONMENT: Lives with:  mother Lives in: House/apartment Stairs: Yes: External: 3 steps; can reach both Has following equipment at home: [employer] Rile - 4 wheeled, pt reports that she does not like to use the rollator.  PLOF: Independent  PATIENT GOALS: Get more mobility in the arm and less pain in the leg.  OBJECTIVE:   DIAGNOSTIC FINDINGS:    CLINICAL DATA:  Initial evaluation for intracranial hemorrhage.   EXAM: CT ANGIOGRAPHY HEAD AND NECK   IMPRESSION: 1. 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 Again, prominent arterial contribution to the AVM is seen from the left ACA and posterior circulation, with primary venous drainage into the deep venous system. Associated 7 mm aneurysm along the right anterolateral aspect of the AVM as above. 2. Diffuse tortuosity and ectasia elsewhere about the major arterial vasculature of the head and neck. No large vessel occlusion or hemodynamically significant stenosis. No other acute  vascular abnormality.  COGNITION: Overall cognitive status: Within functional limits for tasks assessed   SENSATION: WFL  COORDINATION: Pt limited with quick movements during supination/pronation of the wrist on the R side and also with thumb to finger movements.  POSTURE: rounded shoulders  LOWER EXTREMITY ROM:     Active  Right Eval Left Eval  Hip flexion    Hip extension    Hip abduction    Hip adduction    Hip internal rotation    Hip external rotation    Knee flexion    Knee extension    Ankle dorsiflexion    Ankle plantarflexion    Ankle inversion    Ankle eversion     (Blank rows = not tested)  LOWER EXTREMITY MMT:    MMT Right Eval Left Eval  Hip flexion 4 4+  Hip abduction 4 4+  Hip adduction 4- 4-  Knee flexion 4- 4+  Knee extension 4 4+  Ankle dorsiflexion 4 4+  (Blank rows = not tested)   TRANSFERS: Assistive device utilized: None  Sit to stand: Complete Independence Stand to sit: Complete Independence Chair to chair: Complete Independence  GAIT: Gait pattern: step through pattern, decreased arm swing- Right, decreased step length- Left, decreased stance time- Right, decreased stride length, decreased hip/knee flexion- Right, decreased ankle dorsiflexion- Right, and circumduction- Right Distance walked: 40' Assistive device utilized: None Level of assistance: Complete Independence Comments: Pt able to ambulate safely without AD.  FUNCTIONAL TESTS:  5 times sit to stand: 15.44 sec Timed up and go (TUG): 15.65 sec 6 minute walk test: 619' 2 standing rest breaks 10 meter walk test: 15.46 sec Functional gait assessment: 15/30  PATIENT SURVEYS:  FOTO 48/58  TODAY'S TREATMENT:  DATE: 01/02/23    TherEx:  Seated hip adduction into rainbow physioball, 3 sec holds, x15 Seated LAQ with rainbow physioball between  knees, x10 each LE Seated STS x15 (5 regular stance, 5 staggered stance with each LE leading) Standing hip 4 way (marches, abduction, extension) x10 each LE Standing hamstring curls, x10 each LE   Neuro:  Continued with goal assessment FGA and 6MWT and noted above and below in goal section.    PATIENT EDUCATION: Education details: Pt educated on role of PT and services provided during current POC, along with prognosis and information about the clinic. Person educated: Patient and aunt. Education method: Explanation Education comprehension: verbalized understanding  HOME EXERCISE PROGRAM:  Deferred to next visit.  GOALS: Goals reviewed with patient? Yes  SHORT TERM GOALS: Target date: 01/23/2023  Pt will be independent with HEP in order to demonstrate increased ability to perform tasks related to occupation/hobbies. Baseline: Pt not given HEP at initial evaluation. Goal status: INITIAL   LONG TERM GOALS: Target date: 03/20/2023  1.  Patient (> 45 years old) will complete five times sit to stand test in < 15 seconds indicating an increased LE strength and improved balance. Baseline: 15.44 sec Goal status: INITIAL  2.  Patient will increase FOTO score to equal to or greater than 58 to demonstrate statistically significant improvement in mobility and quality of life.  Baseline: 48 Goal status: INITIAL   3.  Patient will increase FGA score by > 4 points to demonstrate decreased fall risk during functional activities. Baseline: 15/30 Goal status: INITIAL   4.  Patient will reduce timed up and go to <11 seconds to reduce fall risk and demonstrate improved transfer/gait ability. Baseline: 15.65 sec Goal status: INITIAL  5.  Patient will increase 10 meter walk test to >1.28ms as to improve gait speed for better community ambulation and to reduce fall risk. Baseline: 15.46 sec Goal status: INITIAL  6.  Patient will increase six minute walk test distance to >1000 for  progression to community ambulator and improve gait ability Baseline: 654 with 2 standing rest breaks, no AD Goal status: INITIAL    ASSESSMENT:  CLINICAL IMPRESSION:  Pt responded well with the exercises and put forth great effort throughout the session.  Pt did have 2 episodes to where her R LE buckled, but she was able to maintain her balance each time.  Pt will continue to benefit from LE strengthening exercises in order to improve overall function of the R LE and return to PLOF.   Pt will continue to benefit from skilled therapy to address remaining deficits in order to improve overall QoL and return to PLOF.      OBJECTIVE IMPAIRMENTS: Abnormal gait, decreased activity tolerance, decreased balance, decreased endurance, decreased mobility, difficulty walking, decreased ROM, decreased strength, and pain.   ACTIVITY LIMITATIONS: carrying, lifting, bending, standing, squatting, sleeping, bathing, toileting, reach over head, hygiene/grooming, and locomotion level  PARTICIPATION LIMITATIONS: meal prep, cleaning, laundry, driving, shopping, community activity, and yard work  PERSONAL FACTORS: Age, Past/current experiences, Social background, Time since onset of injury/illness/exacerbation, Transportation, and 3+ comorbidities: asthma, depression, CHF  are also affecting patient's functional outcome.   REHAB POTENTIAL: Good  CLINICAL DECISION MAKING: Stable/uncomplicated  EVALUATION COMPLEXITY: Moderate  PLAN:  PT FREQUENCY: 1-2x/week, preferably 2x/week but due to insurance, may only be able to be seen 1x/week  PT DURATION: 12 weeks  PLANNED INTERVENTIONS: Therapeutic exercises, Therapeutic activity, Neuromuscular re-education, Balance training, Gait training, Patient/Family education, Self Care, Joint mobilization,  Joint manipulation, Stair training, Vestibular training, Canalith repositioning, Orthotic/Fit training, DME instructions, Aquatic Therapy, Dry Needling, Electrical  stimulation, Spinal manipulation, Spinal mobilization, Cryotherapy, Moist heat, Taping, Manual therapy, and Re-evaluation   PLAN FOR NEXT SESSION:   Continue with balance and LE strengthening program.  OT evaluation not for several weeks, so may want to address R UE deficits if that is the most complicating factor for her.    Gwenlyn Saran, PT, DPT Physical Therapist - [facility]  01/02/23, 5:00 PM

## 2023-01-03 DIAGNOSIS — Z419 Encounter for procedure for purposes other than remedying health state, unspecified: Secondary | ICD-10-CM | POA: Diagnosis not present

## 2023-01-07 ENCOUNTER — Ambulatory Visit: Payer: Medicaid Other | Attending: Internal Medicine | Admitting: Physical Therapy

## 2023-01-07 DIAGNOSIS — R262 Difficulty in walking, not elsewhere classified: Secondary | ICD-10-CM | POA: Diagnosis not present

## 2023-01-07 DIAGNOSIS — R2681 Unsteadiness on feet: Secondary | ICD-10-CM | POA: Diagnosis not present

## 2023-01-07 DIAGNOSIS — R278 Other lack of coordination: Secondary | ICD-10-CM | POA: Insufficient documentation

## 2023-01-07 DIAGNOSIS — R2689 Other abnormalities of gait and mobility: Secondary | ICD-10-CM | POA: Insufficient documentation

## 2023-01-07 DIAGNOSIS — M6281 Muscle weakness (generalized): Secondary | ICD-10-CM | POA: Insufficient documentation

## 2023-01-07 DIAGNOSIS — Z7689 Persons encountering health services in other specified circumstances: Secondary | ICD-10-CM | POA: Diagnosis not present

## 2023-01-07 NOTE — Therapy (Addendum)
OUTPATIENT PHYSICAL THERAPY NEURO TREATMENT   Patient Name: Anna Tapia MRN: [id number] DOB:1991-03-03, 32 y.o., female Today's Date: 01/07/2023   PCP: Teodora Medici, DO  REFERRING PROVIDER: Teodora Medici, DO   END OF SESSION:  PT End of Session - 01/07/2023 - 1442    Visit Number 3   Number of Visits 24    Date for PT Re-Evaluation 03/20/23    Authorization Type [place] MEDICAID PREPAID HEALTH PLAN    PT Start Time [id number]   PT Stop Time 1431    PT Time Calculation (min) 43 min    Equipment Utilized During Treatment Gait belt    Activity Tolerance Patient tolerated treatment well    Behavior During Therapy WFL for tasks assessed/performed             Past Medical History:  Diagnosis Date   Asthma    Brain aneurysm    CHF (congestive heart failure) ([place])    Stroke [facility])    Past Surgical History:  Procedure Laterality Date   VENTRICULOPERITONEAL SHUNT     Patient Active Problem List   Diagnosis Date Noted   Major depressive disorder, recurrent episode, moderate ([place]) 02/07/2022   Sepsis secondary to UTI ([place]) 05/01/2020   VP (ventriculoperitoneal) shunt status 05/01/2020   Right ovarian cyst 05/01/2020   Elevated LFTs 05/01/2020   Vaginal spotting 05/01/2020    ONSET DATE: 07/24/2022  REFERRING DIAG: I69.30 (ICD-10-CM) - History of CVA with residual deficit  THERAPY DIAG:  Unsteadiness on feet  Balance disorder  Difficulty in walking, not elsewhere classified  Muscle weakness (generalized)  Other abnormalities of gait and mobility  Rationale for Evaluation and Treatment: Rehabilitation  SUBJECTIVE:                                                                                                                                                                                             SUBJECTIVE STATEMENT:  Pt reports that she is doing well. Pain 4/10 in the R UE; Deep ache at rest and burning/tingling with movement.    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  PAIN:  Are you having pain? Yes: NPRS scale: 7/10 Pain location: R shoulder and R knee Pain description: Achey feeling Aggravating factors: Moving it and when she first wakes up. Relieving factors: Nothing that she has found  PRECAUTIONS: None  WEIGHT BEARING RESTRICTIONS: No  FALLS: Has patient fallen in last 6 months? Yes. Number of falls 1, during the stroke and fell in the shower.  LIVING ENVIRONMENT: Lives with:  mother Lives in: House/apartment Stairs: Yes: External: 3 steps; can reach both Has following equipment at home: Gilford Rile - 4 wheeled, pt reports that she does not like to use the rollator.  PLOF: Independent  PATIENT GOALS: Get more mobility in the arm and less pain in the leg.  OBJECTIVE:   DIAGNOSTIC FINDINGS:    CLINICAL DATA:  Initial evaluation for intracranial hemorrhage.   EXAM: CT ANGIOGRAPHY HEAD AND NECK   IMPRESSION: 1. 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 Again, prominent arterial contribution to the AVM is seen from the left ACA and posterior circulation, with primary venous drainage into the deep venous system. Associated 7 mm aneurysm along the right anterolateral aspect of the AVM as above. 2. Diffuse tortuosity and ectasia elsewhere about the major arterial vasculature of the head and neck. No large vessel occlusion  or hemodynamically significant stenosis. No other acute vascular abnormality.  COGNITION: Overall cognitive status: Within functional limits for tasks assessed   SENSATION: WFL  COORDINATION: Pt limited with quick movements during supination/pronation of the wrist on the R side and also with thumb to finger movements.  POSTURE: rounded shoulders  LOWER EXTREMITY ROM:     Active  Right Eval Left Eval  Hip flexion    Hip extension    Hip abduction    Hip adduction    Hip internal rotation    Hip external rotation    Knee flexion    Knee extension    Ankle dorsiflexion    Ankle plantarflexion    Ankle inversion    Ankle eversion     (Blank rows = not tested)  LOWER EXTREMITY MMT:    MMT Right Eval Left Eval  Hip flexion 4 4+  Hip abduction 4 4+  Hip adduction 4- 4-  Knee flexion 4- 4+  Knee extension 4 4+  Ankle dorsiflexion 4 4+  (Blank rows = not tested)   TRANSFERS: Assistive device utilized: None  Sit to stand: Complete Independence Stand to sit: Complete Independence Chair to chair: Complete Independence  GAIT: Gait pattern: step through pattern, decreased arm swing- Right, decreased step length- Left, decreased stance time- Right, decreased stride length, decreased hip/knee flexion- Right, decreased ankle dorsiflexion- Right, and circumduction- Right Distance walked: 40' Assistive device utilized: None Level of assistance: Complete Independence Comments: Pt able to ambulate safely without AD.  FUNCTIONAL TESTS:  5 times sit to stand: 15.44 sec Timed up and go (TUG): 15.65 sec 6 minute walk test: 619' 2 standing rest breaks 10 meter walk test: 15.46 sec Functional gait assessment: 15/30  PATIENT SURVEYS:  FOTO 48/58  TODAY'S TREATMENT:  DATE: 01/07/23     Therex: Standing squat with blue tband at knees 2 x 10.   Seated Hip abduction x 12 BLE with blue tabdn  LAQ x 12 BLE with blue tband  Standing hip abduction x 10 BLE  green tband  Standing hip extension green tband x 10 BLE  Neuro:  Semitandem over airex beam 2 x 30 sec bil  Reciprocal foot tap on 6 inch step x 15 BLE. Pt progressed to 1 UE support to finger tip support to no UE support for last 3 reps on BLE.  Lateral foot tap on 6 inch step with LUE supported on rails 2 x 5 Bil.  Pt performed 5 time sit<>stand (5xSTS): 15.5 sec and 11.4 (>15 sec indicates increased fall risk)  Sit<>stand with BUE on RLE to force weight shift to the R. X 5 then LLE held in forward position x 5 .    PATIENT EDUCATION: Education details: Pt educated on role of PT and services provided during current POC, along with prognosis and information about the clinic. Person educated: Patient Education method: Explanation Education comprehension: verbalized understanding  HOME EXERCISE PROGRAM:  Access Code: [email or web address] URL: [email or web address] Date: 01/07/2023 Prepared by: Barrie Folk  Exercises - Staggered Sit-to-Stand with BUE push from RLE - 1 x daily - 7 x weekly - 3 sets - 5 reps  GOALS: Goals reviewed with patient? Yes  SHORT TERM GOALS: Target date: 01/23/2023  Pt will be independent with HEP in order to demonstrate increased ability to perform tasks related to occupation/hobbies. Baseline: Pt not given HEP at initial evaluation. Goal status: INITIAL   LONG TERM GOALS: Target date: 03/20/2023  1.  Patient (> 64 years old) will complete five times sit to stand test in < 15 seconds indicating an increased LE strength and improved balance. Baseline: 15.44 sec Goal status: INITIAL  2.  Patient will increase FOTO score to equal to or greater than 58 to demonstrate statistically significant improvement in mobility and quality of life.  Baseline: 48 Goal status: INITIAL   3.  Patient will increase FGA score by > 4 points to demonstrate  decreased fall risk during functional activities. Baseline: 15/30 Goal status: INITIAL   4.  Patient will reduce timed up and go to <11 seconds to reduce fall risk and demonstrate improved transfer/gait ability. Baseline: 15.65 sec Goal status: INITIAL  5.  Patient will increase 10 meter walk test to >1.16ms as to improve gait speed for better community ambulation and to reduce fall risk. Baseline: 15.46 sec Goal status: INITIAL  6.  Patient will increase six minute walk test distance to >1000 for progression to community ambulator and improve gait ability Baseline: 667 with 2 standing rest breaks, no AD Goal status: INITIAL    ASSESSMENT:  CLINICAL IMPRESSION:  Pt reports that she was nervous to come to therapy today, but put forth consistent effort for maximize therapeutic benefits from strengthening and coordination/balance exercises. Pt fearful of falling throughout session, but no overt knee instability noted throughout session on the R side. Noted improvement in 5xSTS with increased confidence in abilities. Will benefit from continued skilled PT interventions to address balance, strength, and coordination deficits to improve QoL and improve function and safety with IADLs.     OBJECTIVE IMPAIRMENTS: Abnormal gait, decreased activity tolerance, decreased balance, decreased endurance, decreased mobility, difficulty walking, decreased ROM, decreased strength, and pain.   ACTIVITY LIMITATIONS: carrying, lifting, bending, standing, squatting, sleeping, bathing, toileting, reach over head, hygiene/grooming, and locomotion  level  PARTICIPATION LIMITATIONS: meal prep, cleaning, laundry, driving, shopping, community activity, and yard work  PERSONAL FACTORS: Age, Past/current experiences, Social background, Time since onset of injury/illness/exacerbation, Transportation, and 3+ comorbidities: asthma, depression, CHF  are also affecting patient's functional outcome.   REHAB POTENTIAL:  Good  CLINICAL DECISION MAKING: Stable/uncomplicated  EVALUATION COMPLEXITY: Moderate  PLAN:  PT FREQUENCY: 1-2x/week, preferably 2x/week but due to insurance, may only be able to be seen 1x/week  PT DURATION: 12 weeks  PLANNED INTERVENTIONS: Therapeutic exercises, Therapeutic activity, Neuromuscular re-education, Balance training, Gait training, Patient/Family education, Self Care, Joint mobilization, Joint manipulation, Stair training, Vestibular training, Canalith repositioning, Orthotic/Fit training, DME instructions, Aquatic Therapy, Dry Needling, Electrical stimulation, Spinal manipulation, Spinal mobilization, Cryotherapy, Moist heat, Taping, Manual therapy, and Re-evaluation   PLAN FOR NEXT SESSION:   Continue with balance and LE strengthening program.  OT evaluation not for several weeks, so may want to address R UE deficits if that is the most complicating factor for her.    Barrie Folk PT, DPT  Physical Therapist - [facility]  5:21 PM 01/07/23

## 2023-01-09 ENCOUNTER — Ambulatory Visit: Payer: Medicaid Other | Admitting: Physical Therapy

## 2023-01-09 DIAGNOSIS — R2689 Other abnormalities of gait and mobility: Secondary | ICD-10-CM | POA: Diagnosis not present

## 2023-01-09 DIAGNOSIS — M6281 Muscle weakness (generalized): Secondary | ICD-10-CM | POA: Diagnosis not present

## 2023-01-09 DIAGNOSIS — R262 Difficulty in walking, not elsewhere classified: Secondary | ICD-10-CM

## 2023-01-09 DIAGNOSIS — R2681 Unsteadiness on feet: Secondary | ICD-10-CM | POA: Diagnosis not present

## 2023-01-09 DIAGNOSIS — R278 Other lack of coordination: Secondary | ICD-10-CM | POA: Diagnosis not present

## 2023-01-09 DIAGNOSIS — Z7689 Persons encountering health services in other specified circumstances: Secondary | ICD-10-CM | POA: Diagnosis not present

## 2023-01-09 NOTE — Therapy (Signed)
OUTPATIENT PHYSICAL THERAPY NEURO TREATMENT   Patient Name: Anna Tapia MRN: [id number] DOB:Apr 25, 1991, 32 y.o., female Today's Date: 01/09/2023   PCP: Teodora Medici, DO  REFERRING PROVIDER: Teodora Medici, DO   END OF SESSION:  PT End of Session - 01/09/23 1345     Visit Number 4    Number of Visits 24    Date for PT Re-Evaluation 03/20/23    Authorization Type [place] MEDICAID PREPAID HEALTH PLAN    PT Start Time 1350    PT Stop Time 1431    PT Time Calculation (min) 41 min    Equipment Utilized During Treatment Gait belt    Activity Tolerance Patient tolerated treatment well    Behavior During Therapy WFL for tasks assessed/performed              Past Medical History:  Diagnosis Date   Asthma    Brain aneurysm    CHF (congestive heart failure) ([place])    Stroke [facility])    Past Surgical History:  Procedure Laterality Date   VENTRICULOPERITONEAL SHUNT     Patient Active Problem List   Diagnosis Date Noted   Major depressive disorder, recurrent episode, moderate ([place]) 02/07/2022   Sepsis secondary to UTI ([place]) 05/01/2020   VP (ventriculoperitoneal) shunt status 05/01/2020   Right ovarian cyst 05/01/2020   Elevated LFTs 05/01/2020   Vaginal spotting 05/01/2020    ONSET DATE: 07/24/2022  REFERRING DIAG: I69.30 (ICD-10-CM) - History of CVA with residual deficit  THERAPY DIAG:  Unsteadiness on feet  Balance disorder  Difficulty in walking, not elsewhere classified  Muscle weakness (generalized)  Rationale for Evaluation and Treatment: Rehabilitation  SUBJECTIVE:                                                                                                                                                                                             SUBJECTIVE STATEMENT:  Pt reports that she is doing well. Pain 6/10 in the R UE. Pt reports that she does occasionally take tylenol for pain management, but has not had any prior to therapy  session. Also reports mild soreness from PT treatment. Pt is looking forward to starting OT treatment next week.     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  PAIN:  Are you having pain? Yes: NPRS scale: 6/10 Pain location: R shoulder and R knee Pain description: Achey feeling Aggravating factors: Moving it and when she first wakes up. Relieving factors: Nothing that she has found  PRECAUTIONS: None  WEIGHT BEARING RESTRICTIONS: No  FALLS: Has patient fallen in last 6 months? Yes. Number of falls 1, during the stroke and fell in the shower.  LIVING ENVIRONMENT: Lives with:  mother Lives in: House/apartment Stairs: Yes: External: 3 steps; can reach both Has following equipment at home: [employer] Rile - 4 wheeled, pt reports that she does not like to use the rollator.  PLOF: Independent  PATIENT GOALS: Get more mobility in the arm and less pain in the leg.  OBJECTIVE:   DIAGNOSTIC FINDINGS:    CLINICAL DATA:  Initial evaluation for intracranial hemorrhage.   EXAM: CT ANGIOGRAPHY HEAD AND NECK   IMPRESSION: 1. 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 Again, prominent arterial contribution to the AVM is seen from the left ACA and posterior circulation, with primary venous drainage into the deep venous system. Associated 7 mm aneurysm along the right anterolateral aspect of the AVM as above. 2. Diffuse  tortuosity and ectasia elsewhere about the major arterial vasculature of the head and neck. No large vessel occlusion or hemodynamically significant stenosis. No other acute vascular abnormality.  COGNITION: Overall cognitive status: Within functional limits for tasks assessed   SENSATION: WFL  COORDINATION: Pt limited with quick movements during supination/pronation of the wrist on the R side and also with thumb to finger movements.  POSTURE: rounded shoulders  LOWER EXTREMITY ROM:     Active  Right Eval Left Eval  Hip flexion    Hip extension    Hip abduction    Hip adduction    Hip internal rotation    Hip external rotation    Knee flexion    Knee extension    Ankle dorsiflexion    Ankle plantarflexion    Ankle inversion    Ankle eversion     (Blank rows = not tested)  LOWER EXTREMITY MMT:    MMT Right Eval Left Eval  Hip flexion 4 4+  Hip abduction 4 4+  Hip adduction 4- 4-  Knee flexion 4- 4+  Knee extension 4 4+  Ankle dorsiflexion 4 4+  (Blank rows = not tested)   TRANSFERS: Assistive device utilized: None  Sit to stand: Complete Independence Stand to sit: Complete Independence Chair to chair: Complete Independence  GAIT: Gait pattern: step through pattern, decreased arm swing- Right, decreased step length- Left, decreased stance time- Right, decreased stride length, decreased hip/knee flexion- Right, decreased ankle dorsiflexion- Right, and circumduction- Right Distance walked: 40' Assistive device utilized: None Level of assistance: Complete Independence Comments: Pt able to ambulate safely without AD.  FUNCTIONAL TESTS:  5 times sit to stand: 15.44 sec Timed up and go (TUG): 15.65 sec 6 minute walk test: 619' 2 standing rest breaks 10 meter walk test: 15.46 sec Functional gait assessment: 15/30  PATIENT SURVEYS:  FOTO 48/58  TODAY'S TREATMENT:  DATE: 01/09/23    Neuro:  Standing on airex pad:  Normal BOS 3 x 30sec Eyes closed 3 x 30sec Narrow BOS eyes open x 30sec  CGA for safety with min cues from PT for awareness of posterior bias.  Seated ER and ankle Eversion x 10 on the RLE to improve strength and use of intrinsic ankle musculature.    Blaze pods:  Random setting.  4 pods x 1 min with LLE hitting L 2 pods and RLE hitting R 1 bouts.  3 pods x 1 min BLE to tap on random pods.  Min assist from PT while standing on RLE to reduce fall risk and improve weight shift to the R while maintaining trunkal extension.   Gait Gait training with SPC x 179f with cues for reciprocal gait pattern and improved step width and ankle Eversion with heel contact to reduce fall risk.     PATIENT EDUCATION: Education details: Pt educated on role of PT and services provided during current POC, along with prognosis and information about the clinic. Person educated: Patient Education method: Explanation Education comprehension: verbalized understanding  HOME EXERCISE PROGRAM:  Access Code: [email or web address]: [email or web address] Date: 01/07/2023 Prepared by: ABarrie Folk Exercises - Staggered Sit-to-Stand with BUE push from RLE - 1 x daily - 7 x weekly - 3 sets - 5 reps  GOALS: Goals reviewed with patient? Yes  SHORT TERM GOALS: Target date: 01/23/2023  Pt will be independent with HEP in order to demonstrate increased ability to perform tasks related to occupation/hobbies. Baseline: Pt not given HEP at initial evaluation. Goal status: INITIAL   LONG TERM GOALS: Target date: 03/20/2023  1.  Patient (> 628years old) will complete five times sit to stand test in < 15 seconds indicating an increased LE strength and improved balance. Baseline: 15.44 sec Goal status: INITIAL  2.  Patient will increase FOTO score to equal to or greater than 58 to demonstrate statistically significant  improvement in mobility and quality of life.  Baseline: 48 Goal status: INITIAL   3.  Patient will increase FGA score by > 4 points to demonstrate decreased fall risk during functional activities. Baseline: 15/30 Goal status: INITIAL   4.  Patient will reduce timed up and go to <11 seconds to reduce fall risk and demonstrate improved transfer/gait ability. Baseline: 15.65 sec Goal status: INITIAL  5.  Patient will increase 10 meter walk test to >1.072m as to improve gait speed for better community ambulation and to reduce fall risk. Baseline: 15.46 sec Goal status: INITIAL  6.  Patient will increase six minute walk test distance to >1000 for progression to community ambulator and improve gait ability Baseline: 6126with 2 standing rest breaks, no AD Goal status: INITIAL    ASSESSMENT:  CLINICAL IMPRESSION:  Pt flat throughout session, but put forth good effort throughout session. Noted to have increased confidence with stance on the RLE with encouragement and increased repetitions on this day. Pt curious about use of SPC for energy conservation. Demonstrated improved reciprocal pattern with SPC through gait training on this session. Will benefit from continue training if pt wishes to continue use of SPC. Will benefit from continued skilled PT interventions to address balance, strength, and coordination deficits to improve QoL and improve function and safety with IADLs.     OBJECTIVE IMPAIRMENTS: Abnormal gait, decreased activity tolerance, decreased balance, decreased endurance, decreased mobility, difficulty walking, decreased ROM, decreased strength, and pain.   ACTIVITY LIMITATIONS: carrying, lifting, bending, standing, squatting,  sleeping, bathing, toileting, reach over head, hygiene/grooming, and locomotion level  PARTICIPATION LIMITATIONS: meal prep, cleaning, laundry, driving, shopping, community activity, and yard work  PERSONAL FACTORS: Age, Past/current experiences,  Social background, Time since onset of injury/illness/exacerbation, Transportation, and 3+ comorbidities: asthma, depression, CHF  are also affecting patient's functional outcome.   REHAB POTENTIAL: Good  CLINICAL DECISION MAKING: Stable/uncomplicated  EVALUATION COMPLEXITY: Moderate  PLAN:  PT FREQUENCY: 1-2x/week, preferably 2x/week but due to insurance, may only be able to be seen 1x/week  PT DURATION: 12 weeks  PLANNED INTERVENTIONS: Therapeutic exercises, Therapeutic activity, Neuromuscular re-education, Balance training, Gait training, Patient/Family education, Self Care, Joint mobilization, Joint manipulation, Stair training, Vestibular training, Canalith repositioning, Orthotic/Fit training, DME instructions, Aquatic Therapy, Dry Needling, Electrical stimulation, Spinal manipulation, Spinal mobilization, Cryotherapy, Moist heat, Taping, Manual therapy, and Re-evaluation   PLAN FOR NEXT SESSION:   Continue with balance and LE strengthening program.  OT Eval scheduled for 3/13.     Barrie Folk PT, DPT  Physical Therapist - [facility]  2:35 PM 01/09/23

## 2023-01-13 ENCOUNTER — Ambulatory Visit: Payer: Medicaid Other | Admitting: Physical Therapy

## 2023-01-13 DIAGNOSIS — R262 Difficulty in walking, not elsewhere classified: Secondary | ICD-10-CM | POA: Diagnosis not present

## 2023-01-13 DIAGNOSIS — M6281 Muscle weakness (generalized): Secondary | ICD-10-CM | POA: Diagnosis not present

## 2023-01-13 DIAGNOSIS — R278 Other lack of coordination: Secondary | ICD-10-CM | POA: Diagnosis not present

## 2023-01-13 DIAGNOSIS — Z7689 Persons encountering health services in other specified circumstances: Secondary | ICD-10-CM | POA: Diagnosis not present

## 2023-01-13 DIAGNOSIS — R2681 Unsteadiness on feet: Secondary | ICD-10-CM

## 2023-01-13 DIAGNOSIS — R2689 Other abnormalities of gait and mobility: Secondary | ICD-10-CM

## 2023-01-13 NOTE — Therapy (Signed)
OUTPATIENT PHYSICAL THERAPY NEURO TREATMENT   Patient Name: Anna Tapia MRN: [id number] DOB:[birth year], 32 y.o., female Today's Date: 01/13/2023   PCP: Teodora Medici, DO  REFERRING PROVIDER: Teodora Medici, DO   END OF SESSION:  PT End of Session - 01/13/23 1612     Visit Number 5    Number of Visits 24    Date for PT Re-Evaluation 03/20/23    Authorization Type [employer] - Number of Visits 12    Progress Note Due on Visit 10    PT Start Time 1608    PT Stop Time 1648    PT Time Calculation (min) 40 min    Equipment Utilized During Treatment Gait belt    Activity Tolerance Patient tolerated treatment well    Behavior During Therapy WFL for tasks assessed/performed               Past Medical History:  Diagnosis Date   Asthma    Brain aneurysm    CHF (congestive heart failure) ([place])    Stroke [facility])    Past Surgical History:  Procedure Laterality Date   VENTRICULOPERITONEAL SHUNT     Patient Active Problem List   Diagnosis Date Noted   Major depressive disorder, recurrent episode, moderate ([place]) 02/07/2022   Sepsis secondary to UTI ([place]) 05/01/2020   VP (ventriculoperitoneal) shunt status 05/01/2020   Right ovarian cyst 05/01/2020   Elevated LFTs 05/01/2020   Vaginal spotting 05/01/2020    ONSET DATE: 07/24/2022  REFERRING DIAG: I69.30 (ICD-10-CM) - History of CVA with residual deficit  THERAPY DIAG:  Unsteadiness on feet  Balance disorder  Difficulty in walking, not elsewhere classified  Muscle weakness (generalized)  Other abnormalities of gait and mobility  Rationale for Evaluation and Treatment: Rehabilitation  SUBJECTIVE:                                                                                                                                                                                             SUBJECTIVE STATEMENT:  Pt reports that she is doing well. She reports mild  pain in the LUE with movement. States that she spent the weekend with her aunts, but nothing eventful.   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  PAIN:  Are you having pain? Yes: NPRS scale: 4/10 Pain location: R shoulder and R knee Pain description: Achey feeling Aggravating factors: Moving it and when she first wakes up. Relieving factors: Nothing that she has found  PRECAUTIONS: None  WEIGHT BEARING RESTRICTIONS: No  FALLS: Has patient fallen in last 6 months? Yes. Number of falls 1, during the stroke and fell in the shower.  LIVING ENVIRONMENT: Lives with:  mother Lives in: House/apartment Stairs: Yes: External: 3 steps; can reach both Has following equipment at home: Gilford Rile - 4 wheeled, pt reports that she does not like to use the rollator.  PLOF: Independent  PATIENT GOALS: Get more mobility in the arm and less pain in the leg.  OBJECTIVE:   DIAGNOSTIC FINDINGS:    CLINICAL DATA:  Initial evaluation for intracranial hemorrhage.   EXAM: CT ANGIOGRAPHY HEAD AND NECK   IMPRESSION: 1. 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 Again, prominent arterial contribution to the AVM is seen from the left ACA and posterior circulation, with primary venous drainage into the deep venous system. Associated 7 mm aneurysm along the right anterolateral aspect of the AVM as above. 2. Diffuse tortuosity  and ectasia elsewhere about the major arterial vasculature of the head and neck. No large vessel occlusion or hemodynamically significant stenosis. No other acute vascular abnormality.  COGNITION: Overall cognitive status: Within functional limits for tasks assessed   SENSATION: WFL  COORDINATION: Pt limited with quick movements during supination/pronation of the wrist on the R side and also with thumb to finger movements.  POSTURE: rounded shoulders  LOWER EXTREMITY ROM:     Active  Right Eval Left Eval  Hip flexion    Hip extension    Hip abduction    Hip adduction    Hip internal rotation    Hip external rotation    Knee flexion    Knee extension    Ankle dorsiflexion    Ankle plantarflexion    Ankle inversion    Ankle eversion     (Blank rows = not tested)  LOWER EXTREMITY MMT:    MMT Right Eval Left Eval  Hip flexion 4 4+  Hip abduction 4 4+  Hip adduction 4- 4-  Knee flexion 4- 4+  Knee extension 4 4+  Ankle dorsiflexion 4 4+  (Blank rows = not tested)   TRANSFERS: Assistive device utilized: None  Sit to stand: Complete Independence Stand to sit: Complete Independence Chair to chair: Complete Independence  GAIT: Gait pattern: step through pattern, decreased arm swing- Right, decreased step length- Left, decreased stance time- Right, decreased stride length, decreased hip/knee flexion- Right, decreased ankle dorsiflexion- Right, and circumduction- Right Distance walked: 40' Assistive device utilized: None Level of assistance: Complete Independence Comments: Pt able to ambulate safely without AD.  FUNCTIONAL TESTS:  5 times sit to stand: 15.44 sec Timed up and go (TUG): 15.65 sec 6 minute walk test: 619' 2 standing rest breaks 10 meter walk test: 15.46 sec Functional gait assessment: 15/30  PATIENT SURVEYS:  FOTO 48/58  TODAY'S TREATMENT:  DATE: 01/13/23    Nustep BUE/BLE reciprocal movement training x 2 min level 2. Pt noted to have increased RUE ROM with increased time.  Dynamic balance/strengthening foot tap on 6 inch step.  Forward with BUE support x 10 and no UE support x 10  Lateral with 1 UE support x 10 BLE.  Min assist from PT with 0-1 UE support for safety with assist for improved hip extension and erect posture, as well as and weight shift for stance on the RLE.  Ascend/descend steps with 1 UE support leading with RUE ascent and descend with LLE to force use of RLE 2 x 4 with CGA for safety.    Blocked practice sit<>stand with BUE pushing from RLE and LLE anterior to RLE to force increased WB through RLE 2 x10   PT performed sustained stretch to HS, glutes, Periformis, and quadriceps x 1.5 min each on the RLE for improved muscle extensibility, decreased tone, and pain modulation.  Supine bridge with active ER x 5 with 3 sec hold.  Gait training x 131f with supervision assist and min cues from PT for decrease force of heel contact on the LLE to allow increased stance time on the RLE.     PATIENT EDUCATION: Education details: Pt educated on role of PT and services provided during current POC, along with prognosis and information about the clinic. Person educated: Patient Education method: Explanation Education comprehension: verbalized understanding  HOME EXERCISE PROGRAM:  Access Code: [email or web address]: [email or web address] Date: 01/07/2023 Prepared by: ABarrie Folk Exercises - Staggered Sit-to-Stand with BUE push from RLE - 1 x daily - 7 x weekly - 3 sets - 5 reps  GOALS: Goals reviewed with patient? Yes  SHORT TERM GOALS: Target date: 01/23/2023  Pt will be independent with HEP in order to demonstrate increased ability to perform tasks related to occupation/hobbies. Baseline: Pt not given HEP at initial evaluation. Goal status: INITIAL   LONG TERM GOALS: Target  date: 03/20/2023  1.  Patient (> 632years old) will complete five times sit to stand test in < 15 seconds indicating an increased LE strength and improved balance. Baseline: 15.44 sec Goal status: INITIAL  2.  Patient will increase FOTO score to equal to or greater than 58 to demonstrate statistically significant improvement in mobility and quality of life.  Baseline: 48 Goal status: INITIAL   3.  Patient will increase FGA score by > 4 points to demonstrate decreased fall risk during functional activities. Baseline: 15/30 Goal status: INITIAL   4.  Patient will reduce timed up and go to <11 seconds to reduce fall risk and demonstrate improved transfer/gait ability. Baseline: 15.65 sec Goal status: INITIAL  5.  Patient will increase 10 meter walk test to >1.050m as to improve gait speed for better community ambulation and to reduce fall risk. Baseline: 15.46 sec Goal status: INITIAL  6.  Patient will increase six minute walk test distance to >1000 for progression to community ambulator and improve gait ability Baseline: 6175with 2 standing rest breaks, no AD Goal status: INITIAL    ASSESSMENT:  CLINICAL IMPRESSION:  Pt reports increased nerve pain with movement in the RLE on this day, but put forth consistent effort throughout PT treatment. Noted increased tone in the RLE following intervention, reduced with sustained stretch on this day. Continues to be apprehensive to perform strengthening exercises on the RLE, but had no knee instability on this day.   Will benefit from continued skilled PT interventions to address  balance, strength, and coordination deficits to improve QoL and improve function and safety with IADLs.     OBJECTIVE IMPAIRMENTS: Abnormal gait, decreased activity tolerance, decreased balance, decreased endurance, decreased mobility, difficulty walking, decreased ROM, decreased strength, and pain.   ACTIVITY LIMITATIONS: carrying, lifting, bending, standing,  squatting, sleeping, bathing, toileting, reach over head, hygiene/grooming, and locomotion level  PARTICIPATION LIMITATIONS: meal prep, cleaning, laundry, driving, shopping, community activity, and yard work  PERSONAL FACTORS: Age, Past/current experiences, Social background, Time since onset of injury/illness/exacerbation, Transportation, and 3+ comorbidities: asthma, depression, CHF  are also affecting patient's functional outcome.   REHAB POTENTIAL: Good  CLINICAL DECISION MAKING: Stable/uncomplicated  EVALUATION COMPLEXITY: Moderate  PLAN:  PT FREQUENCY: 1-2x/week, preferably 2x/week but due to insurance, may only be able to be seen 1x/week  PT DURATION: 12 weeks  PLANNED INTERVENTIONS: Therapeutic exercises, Therapeutic activity, Neuromuscular re-education, Balance training, Gait training, Patient/Family education, Self Care, Joint mobilization, Joint manipulation, Stair training, Vestibular training, Canalith repositioning, Orthotic/Fit training, DME instructions, Aquatic Therapy, Dry Needling, Electrical stimulation, Spinal manipulation, Spinal mobilization, Cryotherapy, Moist heat, Taping, Manual therapy, and Re-evaluation   PLAN FOR NEXT SESSION:   Continue with balance, coordination and LE strengthening program.  OT Eval scheduled for 3/13.     Barrie Folk PT, DPT  Physical Therapist - [facility]  5:03 PM 01/13/23

## 2023-01-15 ENCOUNTER — Ambulatory Visit: Payer: Medicaid Other

## 2023-01-15 DIAGNOSIS — R2681 Unsteadiness on feet: Secondary | ICD-10-CM | POA: Diagnosis not present

## 2023-01-15 DIAGNOSIS — M6281 Muscle weakness (generalized): Secondary | ICD-10-CM

## 2023-01-15 DIAGNOSIS — R278 Other lack of coordination: Secondary | ICD-10-CM | POA: Diagnosis not present

## 2023-01-15 DIAGNOSIS — Z7689 Persons encountering health services in other specified circumstances: Secondary | ICD-10-CM | POA: Diagnosis not present

## 2023-01-15 DIAGNOSIS — R262 Difficulty in walking, not elsewhere classified: Secondary | ICD-10-CM | POA: Diagnosis not present

## 2023-01-15 DIAGNOSIS — R2689 Other abnormalities of gait and mobility: Secondary | ICD-10-CM

## 2023-01-15 NOTE — Therapy (Signed)
OUTPATIENT PHYSICAL THERAPY TREATMENT   Patient Name: Anna Tapia MRN: [id number] DOB:1990-12-14, 32 y.o., female Today's Date: 01/15/2023   PCP: Teodora Medici, DO  REFERRING PROVIDER: Teodora Medici, DO   END OF SESSION:  PT End of Session - 01/15/23 1023     Visit Number 6    Number of Visits 24    Date for PT Re-Evaluation 03/20/23    Authorization Type [place] [employer]    Authorization Time Period 12/26/22-03/20/23    Progress Note Due on Visit 10    PT Start Time 1018    PT Stop Time 1058    PT Time Calculation (min) 40 min    Equipment Utilized During Treatment Gait belt    Activity Tolerance Patient tolerated treatment well;No increased pain    Behavior During Therapy WFL for tasks assessed/performed               Past Medical History:  Diagnosis Date   Asthma    Brain aneurysm    CHF (congestive heart failure) ([facility])    Stroke [facility])    Past Surgical History:  Procedure Laterality Date   VENTRICULOPERITONEAL SHUNT     Patient Active Problem List   Diagnosis Date Noted   Major depressive disorder, recurrent episode, moderate ([place]) 02/07/2022   Sepsis secondary to UTI ([place]) 05/01/2020   VP (ventriculoperitoneal) shunt status 05/01/2020   Right ovarian cyst 05/01/2020   Elevated LFTs 05/01/2020   Vaginal spotting 05/01/2020    ONSET DATE: 07/24/2022  REFERRING DIAG: I69.30 (ICD-10-CM) - History of CVA with residual deficit  THERAPY DIAG:  Unsteadiness on feet  Balance disorder  Difficulty in walking, not elsewhere classified  Muscle weakness (generalized)  Other abnormalities of gait and mobility  Rationale for Evaluation and Treatment: Rehabilitation  SUBJECTIVE:                                                                                                                                                                                             SUBJECTIVE STATEMENT:  HEP going ok. Pain in RUE  persists. No falls or close calls since last session.   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  PAIN:  Are you having pain? Yes 6-7/10  RUE   PRECAUTIONS: None  WEIGHT BEARING RESTRICTIONS: No  FALLS: Has patient fallen in last 6 months? Yes. Number of falls 1, during the stroke and fell in the shower.  PATIENT GOALS: Get more mobility in the arm and less pain in the leg.  OBJECTIVE:   TODAY'S TREATMENT:                                                                                                                              DATE: 01/15/23   -AA/ROM on Nustep, seat 5, arms 6 (no RUE anchor needed) level 1, x 6 minutes , SPM average 28 -STS from elevated surface 1x10 hands free, supervision level 1x10  -standing alternate marching in place LUE support, 1x30 (15 each) c 3lbAW bilat -STS from elevated surface 1x10 hands holding 10lb orange ball, supervision level 1x10  -standing alternate marching in place LUE support, 1x30 (15 each) c 3lbAW bilat -seated wide stance LUE floor tap (between feet) x10 (not difficult), then 12 c 5lb FW -STS from low surface x10, hands free (16" surface)   -overground AMB 368f, no device, 3lb AW bilat, cues for high intensity effort (CCW) *seated recovery interval 2 minutes -overground AMB 3012f no device, 3lb AW bilat, cues for high intensity effort (CW)  (3 minutes zero seconds)  *seated recovery interval 2 minutes  PATIENT EDUCATION: Education details:  Pt educated on role of PT and services provided during current POC, along with prognosis and information about the clinic. Person educated: Patient Education method: Explanation Education comprehension: verbalized understanding  HOME EXERCISE PROGRAM:  Access Code: [email or web address]: [email or web address] Date: 01/07/2023 Prepared by: AuBarrie FolkExercises - Staggered Sit-to-Stand with BUE push from RLE - 1 x daily - 7 x weekly - 3 sets - 5 reps  GOALS: Goals reviewed with patient? Yes  SHORT TERM GOALS: Target date: 01/23/2023  Pt will be independent with HEP in order to demonstrate increased ability to perform tasks related to occupation/hobbies. Baseline: Pt not given HEP at initial evaluation. Goal status: INITIAL   LONG TERM GOALS: Target date: 03/20/2023  1.  Patient (> 6086ears old) will complete five times sit to stand test in < 15 seconds indicating an increased LE strength and improved balance. Baseline: 15.44 sec Goal status: INITIAL  2.  Patient will increase FOTO score to equal to or greater than 58 to demonstrate statistically significant improvement in mobility and quality of life.  Baseline: 48 Goal status: INITIAL   3.  Patient will increase FGA score by > 4 points to demonstrate decreased fall risk during functional activities. Baseline: 15/30 Goal status: INITIAL   4.  Patient will reduce timed up and go to <11 seconds to reduce fall risk and demonstrate improved transfer/gait ability. Baseline: 15.65 sec Goal status: INITIAL  5.  Patient will increase 10  meter walk test to >1.49ms as to improve gait speed for better community ambulation and to reduce fall risk. Baseline: 15.46 sec Goal status: INITIAL  6.  Patient will increase six minute walk test distance to >1000 for progression to community ambulator and improve gait ability Baseline: 67 with 2 standing rest breaks, no AD Goal status: INITIAL    ASSESSMENT:  CLINICAL  IMPRESSION:  Continued to work on interventions targeting LT goals and outcome measures. Pt has generally good dynamic balance. Pt tries to incorporate RUE use as able. Pt goes immediately over to OT evaluation after session. Will benefit from continued skilled PT interventions to address balance, strength, and coordination deficits to improve QoL and improve function and safety with IADLs.     OBJECTIVE IMPAIRMENTS: Abnormal gait, decreased activity tolerance, decreased balance, decreased endurance, decreased mobility, difficulty walking, decreased ROM, decreased strength, and pain.   ACTIVITY LIMITATIONS: carrying, lifting, bending, standing, squatting, sleeping, bathing, toileting, reach over head, hygiene/grooming, and locomotion level  PARTICIPATION LIMITATIONS: meal prep, cleaning, laundry, driving, shopping, community activity, and yard work  PERSONAL FACTORS: Age, Past/current experiences, Social background, Time since onset of injury/illness/exacerbation, Transportation, and 3+ comorbidities: asthma, depression, CHF  are also affecting patient's functional outcome.   REHAB POTENTIAL: Good  CLINICAL DECISION MAKING: Stable/uncomplicated  EVALUATION COMPLEXITY: Moderate  PLAN:  PT FREQUENCY: 1-2x/week, preferably 2x/week but due to insurance, may only be able to be seen 1x/week  PT DURATION: 12 weeks  PLANNED INTERVENTIONS: Therapeutic exercises, Therapeutic activity, Neuromuscular re-education, Balance training, Gait training, Patient/Family education, Self Care, Joint mobilization, Joint manipulation, Stair training, Vestibular training, Canalith repositioning, Orthotic/Fit training, DME instructions, Aquatic Therapy, Dry Needling, Electrical stimulation, Spinal manipulation, Spinal mobilization, Cryotherapy, Moist heat, Taping, Manual therapy, and Re-evaluation   PLAN FOR NEXT SESSION:  Continue with balance, coordination and LE strengthening program.     10:26 AM,  01/15/23 AEtta Grandchild PT, DPT Physical Therapist - [phone]    10:26 AM 01/15/23

## 2023-01-15 NOTE — Therapy (Unsigned)
OUTPATIENT OCCUPATIONAL THERAPY NEURO EVALUATION  Patient Name: Anna Tapia MRN: [id number] DOB:1991/07/28, 32 y.o., female Today's Date: 01/16/2023  PCP: Dr. Teodora Medici REFERRING PROVIDER: Dr. Teodora Medici  END OF SESSION:  OT End of Session - 01/16/23 1112     Visit Number 1    Number of Visits 24    Date for OT Re-Evaluation 04/09/23    Progress Note Due on Visit 10    Activity Tolerance Patient tolerated treatment well    Behavior During Therapy [name] for tasks assessed/performed             Past Medical History:  Diagnosis Date   Asthma    Brain aneurysm    CHF (congestive heart failure) ([place])    Stroke [facility])    Past Surgical History:  Procedure Laterality Date   VENTRICULOPERITONEAL SHUNT     Patient Active Problem List   Diagnosis Date Noted   Major depressive disorder, recurrent episode, moderate ([place]) 02/07/2022   Sepsis secondary to UTI ([place]) 05/01/2020   VP (ventriculoperitoneal) shunt status 05/01/2020   Right ovarian cyst 05/01/2020   Elevated LFTs 05/01/2020   Vaginal spotting 05/01/2020    ONSET DATE: Sept 2023;   REFERRING DIAG:  I63.9 (ICD-10-CM) - CVA (cerebral vascular accident) ([place])   THERAPY DIAG:  Muscle weakness (generalized)  Other lack of coordination  Rationale for Evaluation and Treatment: Rehabilitation  SUBJECTIVE:   SUBJECTIVE STATEMENT: Pt reports that she doesn't really use the right arm and it's very painful. Pt accompanied by: self  PERTINENT HISTORY: Per Dr. Rosana Berger on 11/19/22: HPI Anna Tapia presents to establish care.  She is here with her mom.   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       PRECAUTIONS: None  WEIGHT BEARING RESTRICTIONS: No  PAIN:  Are you having pain? Yes: NPRS scale: 6-7/10 Pain location: Entire R side, arm and leg Pain description: pins and needles in the hand, arm is achy, moving the arm causes stabbing pain Aggravating factors: movement of the arm Relieving factors: rest; pt reports medication is minimally effective  FALLS: Has patient fallen in last 6 months? Yes. 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  Being independent."  OBJECTIVE:   HAND DOMINANCE: Left  ADLs: Overall ADLs: mostly performed with the LUE Transfers/ambulation related to ADLs: indep/extra time Eating: cuts food 1 handed Grooming: 1 handed (L) UB Dressing: occasional help to don clothes if tired after a shower; help with straightening clothes; struggles with clothing fasteners LB Dressing: wearing slip on shoes; difficulty with clothing fasteners  Toileting: indep Bathing:  modified indep; pt makes attempts to engage the R arm while bathing but reports this is difficult Tub Shower transfers: modified indep Equipment:  see above  IADLs: Shopping: pt can go shopping accompanied by a family member Light housekeeping: 1 handed Meal Prep: cooking causes fatigue; mostly L handed Community mobility: uses rollator for community mobility; uses [employer] transportation Medication management: uses weekly pill  organizer; Chief Executive Officer: mother manages (pt states she doesn't really have any bills)  Handwriting:  N/A (pt is L hand dominant)  MOBILITY STATUS:  ambulatory without AD; pt seeing PT for LE strengthening and balance deficits  POSTURE COMMENTS:  rounded shoulders Sitting balance: Moves/returns truncal midpoint >2 inches in all planes  ACTIVITY TOLERANCE: Activity tolerance: Pain in the R side significantly limits activity tolerance.  Pt reports she can get fatigued with ADLs and IADLs.  FUNCTIONAL OUTCOME MEASURES: FOTO: 44; 48  UPPER EXTREMITY ROM:    Active ROM Right eval Left Eval WNL  Shoulder flexion 38 (60)   Shoulder abduction    Shoulder adduction 42 (51)   Shoulder extension    Shoulder internal rotation    Shoulder external rotation Hand to back of head (scaption and chin tuck)   Elbow flexion WNL   Elbow extension WNL   Wrist flexion WNL   Wrist extension WNL   Wrist ulnar deviation    Wrist radial deviation    Wrist pronation WNL   Wrist supination WNL   (Blank rows = not tested)  R hand: Able to oppose each digit to thumb with extra time and able to make full composite fist  UPPER EXTREMITY MMT:     MMT Right eval Left Eval 5/5  Shoulder flexion 3-   Shoulder abduction 3-   Shoulder adduction    Shoulder extension    Shoulder internal rotation 3+   Shoulder external rotation 3+   Middle trapezius    Lower trapezius    Elbow flexion 4-   Elbow extension 4   Wrist flexion 4-   Wrist extension 3+   Wrist ulnar deviation    Wrist radial deviation    Wrist pronation    Wrist supination    (Blank rows = not tested)  HAND FUNCTION: Grip strength: Right: 4 lbs; Left: 50 lbs, Lateral pinch: Right: 0 lbs, Left: 17 lbs, and 3 point pinch: Right: 0 lbs, Left: 19 lbs  COORDINATION: Finger Nose Finger test: able to touch nose with increased time 9 Hole Peg test: Right: unable (picked up a peg with repeat trials but unable to move the  peg from horizontal to vertical position to place into board) sec; Left: 24 sec  SENSATION: tingling in the R hand  Light touch: Impaired  Proprioception: WFL  EDEMA: N/A  MUSCLE TONE: RUE: Mild and Hypertonic  COGNITION: Overall cognitive status: Within functional limits for tasks assessed  VISION: Subjective report: Pt reports increased blurred vision following her stroke Baseline vision: Wears glasses all the time  VISION ASSESSMENT: Ocular ROM: WFL Gaze preference/alignment: gaze left; slight Tracking/Visual pursuits: Able to track stimulus in all quads without difficulty Saccades: WFL Visual Fields: no apparent deficits  PERCEPTION: WFL  PRAXIS: Impaired: Motor planning  OBSERVATIONS: Pt guards RUE with flexed elbow and shoulder IR/hand to abdomen during mobility.  R scapular elevation noting increased tension in R upper trap.  TODAY'S TREATMENT:  DATE: 01/15/23 Evaluation completed.   PATIENT EDUCATION: Education details: OT role, goals, poc; attempt R arm swing during mobility/avoid tucking R arm in to abdomen to guard Person educated: Patient Education method: Explanation and Verbal cues Education comprehension: verbalized understanding and needs further education  HOME EXERCISE PROGRAM: To be initiated next session  GOALS: Goals reviewed with patient? Yes  SHORT TERM GOALS: Target date: 02/26/23 (6 weeks)  Pt will be indep to perform HEP for increasing RUE flexibility, strength, and coordination to better engage the RUE into daily tasks. Baseline: Not yet initiated Goal status: INITIAL  LONG TERM GOALS: Target date: 04/09/23 (12 weeks)  Pt will increase FOTO score to 48 or better to indicate clinically relevant improvement in self perceived use of R arm for daily tasks.  Baseline: Eval: 44  Goal status: INITIAL  2.  Pt will increase  R active shoulder flexion to 100* or better to achieve functional ROM for UB ADLs and reaching for ADL supplies. Baseline: Eval: R 38 degrees (passive 60); pt reaches during ADLs only with the L arm Goal status: INITIAL  3.  Pt will increase R grip strength by 10 or more lbs to enable use of R hand to hold and carry light ADL supplies. Baseline: Eval: R grip 4 lbs, L 50 lbs Goal status: INITIAL  4.  Pt will report <4/10 pain throughout the RUE to enable improved tolerance with using RUE during daily tasks.  Baseline: Eval: 6-7/10 pain throughout the R side (pt predominantly uses L hand for ADLs) Goal status: INITIAL  5.  Pt will increase R hand dexterity/FMC skills to manipulate clothing fasteners with bilat hands. Baseline: Eval: pt avoids clothing fasteners or manages with extra time using L hand only (R 9 hole: unable, L 24 sec) Goal status: INITIAL  ASSESSMENT:  CLINICAL IMPRESSION: Patient is a 32 y.o. female who was seen today for occupational therapy evaluation for CVA.  Pt presents with mild hypertonicity throughout the RUE, guarded posture with the R arm flexed to abdomen, elevated R scapula, reporting pain in the entire right side at 6-7/10 pain, and tingling in the R hand.  Pt presents with R shoulder stiffness, RUE weakness, decreased coordination, and pain, all of which are limiting use of the R arm with daily tasks.  Pt acknowledges that she really doesn't use the R arm d/t the pain, and predominantly uses her L arm for everything.  Pt is eager to work towards maximizing her indep and functional use of the R arm for daily tasks.    PERFORMANCE DEFICITS: in functional skills including ADLs, IADLs, coordination, dexterity, sensation, tone, ROM, strength, pain, flexibility, Fine motor control, Gross motor control, mobility, balance, body mechanics, decreased knowledge of use of DME, vision, and UE functional use.  IMPAIRMENTS: are limiting patient from ADLs, IADLs, rest and sleep,  work, and leisure.   CO-MORBIDITIES: has co-morbidities such as CHF, depression  that affects occupational performance. Patient will benefit from skilled OT to address above impairments and improve overall function.  MODIFICATION OR ASSISTANCE TO COMPLETE EVALUATION: No modification of tasks or assist necessary to complete an evaluation.  OT OCCUPATIONAL PROFILE AND HISTORY: Problem focused assessment: Including review of records relating to presenting problem.  CLINICAL DECISION MAKING: Moderate - several treatment options, min-mod task modification necessary  REHAB POTENTIAL: Good  EVALUATION COMPLEXITY: Moderate    PLAN:  OT FREQUENCY: 1-2x/week (pending any visit limitations with insurance), but would benefit from 2x/week  OT DURATION: 12 weeks  PLANNED  INTERVENTIONS: self care/ADL training, therapeutic exercise, therapeutic activity, neuromuscular re-education, manual therapy, passive range of motion, balance training, moist heat, cryotherapy, patient/family education, coping strategies training, and DME and/or AE instructions  RECOMMENDED OTHER SERVICES: OT recommended pt follow up with her eye doctor as she does report some increased blurred vision since her CVA  CONSULTED AND AGREED WITH PLAN OF CARE: Patient  PLAN FOR NEXT SESSION: see above  Leta Speller, MS, OTR/L   Darleene Cleaver, OT 01/16/2023, 11:14 AM

## 2023-01-20 ENCOUNTER — Encounter: Payer: Medicaid Other | Admitting: Occupational Therapy

## 2023-01-20 ENCOUNTER — Ambulatory Visit: Payer: Medicaid Other | Admitting: Physical Therapy

## 2023-01-20 ENCOUNTER — Other Ambulatory Visit: Payer: Self-pay | Admitting: Internal Medicine

## 2023-01-20 DIAGNOSIS — I6930 Unspecified sequelae of cerebral infarction: Secondary | ICD-10-CM

## 2023-01-20 DIAGNOSIS — R262 Difficulty in walking, not elsewhere classified: Secondary | ICD-10-CM | POA: Diagnosis not present

## 2023-01-20 DIAGNOSIS — M6281 Muscle weakness (generalized): Secondary | ICD-10-CM

## 2023-01-20 DIAGNOSIS — R2681 Unsteadiness on feet: Secondary | ICD-10-CM | POA: Diagnosis not present

## 2023-01-20 DIAGNOSIS — R278 Other lack of coordination: Secondary | ICD-10-CM

## 2023-01-20 DIAGNOSIS — Z7689 Persons encountering health services in other specified circumstances: Secondary | ICD-10-CM | POA: Diagnosis not present

## 2023-01-20 DIAGNOSIS — R2689 Other abnormalities of gait and mobility: Secondary | ICD-10-CM

## 2023-01-20 NOTE — Therapy (Signed)
OUTPATIENT PHYSICAL THERAPY TREATMENT   Patient Name: Anna Tapia MRN: [id number] DOB:05-31-1991, 32 y.o., female Today's Date: 01/20/2023   PCP: Teodora Medici, DO  REFERRING PROVIDER: Teodora Medici, DO   END OF SESSION:  PT End of Session - 01/20/23 1129     Visit Number 7    Number of Visits 24    Date for PT Re-Evaluation 03/20/23    Authorization Type [place] MEDICAID PREPAID HEALTH PLAN    Authorization Time Period 12/26/22-03/20/23    Progress Note Due on Visit 10    PT Start Time 1127    PT Stop Time 1151    PT Time Calculation (min) 24 min    Equipment Utilized During Treatment Gait belt    Activity Tolerance Patient tolerated treatment well;No increased pain    Behavior During Therapy WFL for tasks assessed/performed                Past Medical History:  Diagnosis Date   Asthma    Brain aneurysm    CHF (congestive heart failure) ([facility])    Stroke [facility])    Past Surgical History:  Procedure Laterality Date   VENTRICULOPERITONEAL SHUNT     Patient Active Problem List   Diagnosis Date Noted   Major depressive disorder, recurrent episode, moderate ([place]) 02/07/2022   Sepsis secondary to UTI ([place]) 05/01/2020   VP (ventriculoperitoneal) shunt status 05/01/2020   Right ovarian cyst 05/01/2020   Elevated LFTs 05/01/2020   Vaginal spotting 05/01/2020    ONSET DATE: 07/24/2022  REFERRING DIAG: I69.30 (ICD-10-CM) - History of CVA with residual deficit  THERAPY DIAG:  Muscle weakness (generalized)  Other lack of coordination  Unsteadiness on feet  Balance disorder  Difficulty in walking, not elsewhere classified  Rationale for Evaluation and Treatment: Rehabilitation  SUBJECTIVE:                                                                                                                                                                                             SUBJECTIVE STATEMENT:  Pt is apologetic for being late. States that  [employer] was not informed of pickup location change, resulting in late arrival to PT treatment. Pt rates RUE pain 8/10 on this day. Burning/tingling. Pt reports that she took medication without reduction in pain .   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  PAIN:  Are you having pain? Yes see above  RUE   PRECAUTIONS: None  WEIGHT BEARING RESTRICTIONS: No  FALLS: Has patient fallen in last 6 months? Yes. Number of falls 1, during the stroke and fell in the shower.  PATIENT GOALS: Get more mobility in the arm and less pain in the leg.  OBJECTIVE:   TODAY'S TREATMENT:                                                                                                                              DATE: 01/20/23   NMR.  Side stepping in parallel bars 45ft x4.  Hip flexion/hip abduction GTB 2 x 10 bil.  Forward partial lunge over bolster x 8 BLE Lateral lunge over bolster x 10 Bil  Partial lunge on bosu ball x 10 BLE with UE support on rails for first 8 reps on each side Sit<>stand x 10 no UE support  Sit<>stand with LLE on airex pad x 10 no UE support CGA from PT for safety with min cues for neutral R hip position and improved weigh shift over the RLE intermittently.    PATIENT EDUCATION: Education details: Pt educated on role of PT and services provided during current POC, along with prognosis and information about the clinic. Person educated: Patient Education method: Explanation Education  comprehension: verbalized understanding  HOME EXERCISE PROGRAM:  Access Code: [email or web address] URL: [email or web address] Date: 01/07/2023 Prepared by: Barrie Folk  Exercises - Staggered Sit-to-Stand with BUE push from RLE - 1 x daily - 7 x weekly - 3 sets - 5 reps  GOALS: Goals reviewed with patient? Yes  SHORT TERM GOALS: Target date: 01/23/2023  Pt will be independent with HEP in order to demonstrate increased ability to perform tasks related to occupation/hobbies. Baseline: Pt not given HEP at initial evaluation. Goal status: INITIAL   LONG TERM GOALS: Target date: 03/20/2023  1.  Patient (> 16 years old) will complete five times sit to stand test in < 15 seconds indicating an increased LE strength and improved balance. Baseline: 15.44 sec Goal status: INITIAL  2.  Patient will increase FOTO score to equal to or greater than 58 to demonstrate statistically significant improvement in mobility and quality of life.  Baseline: 48 Goal status: INITIAL   3.  Patient will increase FGA score by > 4 points to demonstrate decreased fall risk during functional activities. Baseline: 15/30 Goal status: INITIAL   4.  Patient will reduce timed up and go to <11 seconds to reduce fall risk and demonstrate improved transfer/gait ability. Baseline: 15.65 sec Goal status: INITIAL  5.  Patient will increase 10 meter walk test to >1.16m/s as to improve gait speed for better community ambulation and to reduce fall  risk. Baseline: 15.46 sec Goal status: INITIAL  6.  Patient will increase six minute walk test distance to >1000 for progression to community ambulator and improve gait ability Baseline: 88' with 2 standing rest breaks, no AD Goal status: INITIAL    ASSESSMENT:  CLINICAL IMPRESSION:  PT treatment limited due to pt being 20 min late to therapy session on this day. Pt put forth good effort to RLE strength, coordination, and balance training. Noted to have improved  success and strength on the RLE on this day with no instance of knee instability and reduced fear of falling with dynamic movement. Will benefit from continued skilled PT interventions to address balance, strength, and coordination deficits to improve QoL and improve function and safety with IADLs.     OBJECTIVE IMPAIRMENTS: Abnormal gait, decreased activity tolerance, decreased balance, decreased endurance, decreased mobility, difficulty walking, decreased ROM, decreased strength, and pain.   ACTIVITY LIMITATIONS: carrying, lifting, bending, standing, squatting, sleeping, bathing, toileting, reach over head, hygiene/grooming, and locomotion level  PARTICIPATION LIMITATIONS: meal prep, cleaning, laundry, driving, shopping, community activity, and yard work  PERSONAL FACTORS: Age, Past/current experiences, Social background, Time since onset of injury/illness/exacerbation, Transportation, and 3+ comorbidities: asthma, depression, CHF  are also affecting patient's functional outcome.   REHAB POTENTIAL: Good  CLINICAL DECISION MAKING: Stable/uncomplicated  EVALUATION COMPLEXITY: Moderate  PLAN:  PT FREQUENCY: 1-2x/week, preferably 2x/week but due to insurance, may only be able to be seen 1x/week  PT DURATION: 12 weeks  PLANNED INTERVENTIONS: Therapeutic exercises, Therapeutic activity, Neuromuscular re-education, Balance training, Gait training, Patient/Family education, Self Care, Joint mobilization, Joint manipulation, Stair training, Vestibular training, Canalith repositioning, Orthotic/Fit training, DME instructions, Aquatic Therapy, Dry Needling, Electrical stimulation, Spinal manipulation, Spinal mobilization, Cryotherapy, Moist heat, Taping, Manual therapy, and Re-evaluation   PLAN FOR NEXT SESSION:  Continue with balance, coordination and LE strengthening program.     Barrie Folk PT, DPT  Physical Therapist - [facility]  12:33 PM 01/20/23      12:33 PM 01/20/23

## 2023-01-21 NOTE — Telephone Encounter (Signed)
Requested Prescriptions  Pending Prescriptions Disp Refills   baclofen (LIORESAL) 10 MG tablet [Pharmacy Med Name: Baclofen 10 MG Oral Tablet] 30 tablet 0    Sig: Take 1 tablet by mouth twice daily     Analgesics:  Muscle Relaxants - baclofen Passed - 01/20/2023 11:29 AM      Passed - Cr in normal range and within 180 days    Creat  Date Value Ref Range Status  11/19/2022 0.71 0.50 - 0.97 mg/dL Final         Passed - eGFR is 30 or above and within 180 days    EGFR (African American)  Date Value Ref Range Status  09/19/2014 >60 >69mL/min Final   GFR calc Af Amer  Date Value Ref Range Status  05/06/2020 >60 >60 mL/min Final   EGFR (Non-African Amer.)  Date Value Ref Range Status  09/19/2014 >60 >103mL/min Final    Comment:    eGFR values <72mL/min/1.73 m2 may be an indication of chronic kidney disease (CKD). Calculated eGFR, using the MRDR Study equation, is useful in  patients with stable renal function. The eGFR calculation will not be reliable in acutely ill patients when serum creatinine is changing rapidly. It is not useful in patients on dialysis. The eGFR calculation may not be applicable to patients at the low and high extremes of body sizes, pregnant women, and vegetarians.    GFR, Estimated  Date Value Ref Range Status  07/24/2022 >60 >60 mL/min Final    Comment:    (NOTE) Calculated using the CKD-EPI Creatinine Equation (2021)    eGFR  Date Value Ref Range Status  11/19/2022 116 > OR = 60 mL/min/1.47m2 Final         Passed - Valid encounter within last 6 months    Recent Outpatient Visits           2 months ago History of CVA with residual deficit   [facility] Teodora Medici, DO       Future Appointments             In 2 days Agbor-Etang, Aaron Edelman, MD [place] [facility]   In 1 month Teodora Medici, [facility], [facility]

## 2023-01-22 ENCOUNTER — Ambulatory Visit: Payer: Medicaid Other

## 2023-01-22 ENCOUNTER — Ambulatory Visit: Payer: Medicaid Other | Admitting: Physical Therapy

## 2023-01-22 DIAGNOSIS — M6281 Muscle weakness (generalized): Secondary | ICD-10-CM | POA: Diagnosis not present

## 2023-01-22 DIAGNOSIS — R278 Other lack of coordination: Secondary | ICD-10-CM

## 2023-01-22 DIAGNOSIS — R2689 Other abnormalities of gait and mobility: Secondary | ICD-10-CM

## 2023-01-22 DIAGNOSIS — Z7689 Persons encountering health services in other specified circumstances: Secondary | ICD-10-CM | POA: Diagnosis not present

## 2023-01-22 DIAGNOSIS — R262 Difficulty in walking, not elsewhere classified: Secondary | ICD-10-CM

## 2023-01-22 DIAGNOSIS — R2681 Unsteadiness on feet: Secondary | ICD-10-CM

## 2023-01-22 NOTE — Therapy (Signed)
OUTPATIENT PHYSICAL THERAPY TREATMENT   Patient Name: Anna Tapia MRN: [id number] DOB:03/13/1991, 32 y.o., female Today's Date: 01/22/2023   PCP: Teodora Medici, DO  REFERRING PROVIDER: Teodora Medici, DO   END OF SESSION:  PT End of Session - 01/22/23 1402     Visit Number 8    Number of Visits 24    Date for PT Re-Evaluation 03/20/23    Authorization Type [place] MEDICAID PREPAID HEALTH PLAN    Authorization Time Period 12/26/22-03/20/23    Progress Note Due on Visit 10    PT Start Time [id number]    PT Stop Time 1430    PT Time Calculation (min) 32 min    Equipment Utilized During Treatment Gait belt    Activity Tolerance Patient tolerated treatment well;No increased pain    Behavior During Therapy WFL for tasks assessed/performed                Past Medical History:  Diagnosis Date   Asthma    Brain aneurysm    CHF (congestive heart failure) (HCC)    Stroke [facility])    Past Surgical History:  Procedure Laterality Date   VENTRICULOPERITONEAL SHUNT     Patient Active Problem List   Diagnosis Date Noted   Major depressive disorder, recurrent episode, moderate ([place]) 02/07/2022   Sepsis secondary to UTI ([place]) 05/01/2020   VP (ventriculoperitoneal) shunt status 05/01/2020   Right ovarian cyst 05/01/2020   Elevated LFTs 05/01/2020   Vaginal spotting 05/01/2020    ONSET DATE: 07/24/2022  REFERRING DIAG: I69.30 (ICD-10-CM) - History of CVA with residual deficit  THERAPY DIAG:  Unsteadiness on feet  Muscle weakness (generalized)  Other lack of coordination  Balance disorder  Difficulty in walking, not elsewhere classified  Rationale for Evaluation and Treatment: Rehabilitation  SUBJECTIVE:                                                                                                                                                                                             SUBJECTIVE STATEMENT:  Pt is apologetic for being late. States that  [facility] had to pick up a patient before her that was slow to access transportation. No other new updatesPt rates RUE pain 8/10 on this day. Burning/tingling. Pt reports that she took medication without noticable reduction in pain .   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  PAIN:  Are you having pain? Yes see above  RUE   PRECAUTIONS: None  WEIGHT BEARING RESTRICTIONS: No  FALLS: Has patient fallen in last 6 months? Yes. Number of falls 1, during the stroke and fell in the shower.  PATIENT GOALS: Get more mobility in the arm and less pain in the leg.  OBJECTIVE:   TODAY'S TREATMENT:                                                                                                                              DATE: 01/22/23  Step up to 6inch step with RLE and BUE supported on rail 2x6 then with LLE no UE support 2x 6  Foursquare random step to target number 2x15 Gait with dual task to hold small laundry basket with spike ball 2 x 15ft  Figure 4 stretch in sitting for improved ER x 1 min LLE and x 58min on the RLE. Sit<>stand with LLE on 3"step x 6 with CGA from PT for weight shift over the RLE.  Throughout  session, supervision assist provided by PT with forced use of RUE with dual task gait, UE support with stairs and use of RUE to assist with figure 4 stretch. Instruction for heel contact and hip ER with gait with mild improvement following verbal and tactile instruction.  PATIENT  EDUCATION: Education details: pt educated throughout session about proper posture and technique with exercises. Improved exercise technique, movement at target joints, use of target muscles after min to mod verbal, visual, tactile cues.  Person educated: Patient Education method: Explanation Education comprehension: verbalized understanding  HOME EXERCISE PROGRAM:   Access Code: [email or web address] URL: [email or web address] Date: 01/07/2023 Prepared by: Barrie Folk  Exercises - Staggered Sit-to-Stand with BUE push from RLE - 1 x daily - 7 x weekly - 3 sets - 5 reps  Access Code: [email or web address] URL: [email or web address] Date: 01/22/2023 Prepared by: Barrie Folk  Exercises - Seated Figure 4 Piriformis Stretch  - 1 x daily - 7 x weekly - 2 sets - 3 reps - 60 sec hold  GOALS: Goals reviewed with patient? Yes  SHORT TERM GOALS: Target date: 01/23/2023  Pt will be independent with HEP in order to demonstrate increased ability to perform tasks related to occupation/hobbies. Baseline: Pt not given HEP at initial evaluation. Goal status: INITIAL   LONG TERM GOALS: Target date: 03/20/2023  1.  Patient (> 90 years old) will complete five times sit to stand test in < 15 seconds indicating an increased LE strength and improved balance. Baseline: 15.44 sec Goal status: INITIAL  2.  Patient will increase FOTO score to equal to or greater than 58 to demonstrate statistically significant improvement in mobility and quality of  life.  Baseline: 48 Goal status: INITIAL   3.  Patient will increase FGA score by > 4 points to demonstrate decreased fall risk during functional activities. Baseline: 15/30 Goal status: INITIAL   4.  Patient will reduce timed up and go to <11 seconds to reduce fall risk and demonstrate improved transfer/gait ability. Baseline: 15.65 sec Goal status: INITIAL  5.  Patient will increase 10 meter walk test to >1.70m/s as to improve gait speed for  better community ambulation and to reduce fall risk. Baseline: 15.46 sec Goal status: INITIAL  6.  Patient will increase six minute walk test distance to >1000 for progression to community ambulator and improve gait ability Baseline: 23' with 2 standing rest breaks, no AD Goal status: INITIAL    ASSESSMENT:  CLINICAL IMPRESSION:  PT treatment limited due to pt being 15 min late to scheduled therapy session on this day. Pt put forth good effort to RLE strength, coordination, and balance training. Continues to have improved strength and confidence in stance on the RLE with all dynamic movement, but pt is finding it hard to see improvements in strength and function on daily basis. Will benefit from continued skilled PT interventions to address balance, strength, and coordination deficits to improve QoL and improve function and safety with IADLs.     OBJECTIVE IMPAIRMENTS: Abnormal gait, decreased activity tolerance, decreased balance, decreased endurance, decreased mobility, difficulty walking, decreased ROM, decreased strength, and pain.   ACTIVITY LIMITATIONS: carrying, lifting, bending, standing, squatting, sleeping, bathing, toileting, reach over head, hygiene/grooming, and locomotion level  PARTICIPATION LIMITATIONS: meal prep, cleaning, laundry, driving, shopping, community activity, and yard work  PERSONAL FACTORS: Age, Past/current experiences, Social background, Time since onset of injury/illness/exacerbation, Transportation, and 3+ comorbidities: asthma, depression, CHF  are also affecting patient's functional outcome.   REHAB POTENTIAL: Good  CLINICAL DECISION MAKING: Stable/uncomplicated  EVALUATION COMPLEXITY: Moderate  PLAN:  PT FREQUENCY: 1-2x/week, preferably 2x/week but due to insurance, may only be able to be seen 1x/week  PT DURATION: 12 weeks  PLANNED INTERVENTIONS: Therapeutic exercises, Therapeutic activity, Neuromuscular re-education, Balance training, Gait  training, Patient/Family education, Self Care, Joint mobilization, Joint manipulation, Stair training, Vestibular training, Canalith repositioning, Orthotic/Fit training, DME instructions, Aquatic Therapy, Dry Needling, Electrical stimulation, Spinal manipulation, Spinal mobilization, Cryotherapy, Moist heat, Taping, Manual therapy, and Re-evaluation   PLAN FOR NEXT SESSION:  Treadmill training. Use of airex pad or blaze pods for dynamic balance/coordination training.      Barrie Folk PT, DPT  Physical Therapist - [facility]  2:32 PM 01/22/23     2:32 PM 01/22/23

## 2023-01-23 ENCOUNTER — Ambulatory Visit: Payer: Medicaid Other | Admitting: Cardiology

## 2023-01-26 NOTE — Therapy (Signed)
OUTPATIENT OCCUPATIONAL THERAPY NEURO TREATMENT NOTE  Patient Name: Anna Tapia MRN: [id number] DOB:08-02-1991, 32 y.o., female Today's Date: 01/26/2023  PCP: Dr. Teodora Medici REFERRING PROVIDER: Dr. Teodora Medici  END OF SESSION:  OT End of Session - 01/26/23 0954     Visit Number 2    Number of Visits 24    Date for OT Re-Evaluation 04/09/23    Progress Note Due on Visit 10    OT Start Time 1430    OT Stop Time 1515    OT Time Calculation (min) 45 min    Activity Tolerance Patient tolerated treatment well    Behavior During Therapy [facility] for tasks assessed/performed             Past Medical History:  Diagnosis Date   Asthma    Brain aneurysm    CHF (congestive heart failure) ([place])    Stroke [facility])    Past Surgical History:  Procedure Laterality Date   VENTRICULOPERITONEAL SHUNT     Patient Active Problem List   Diagnosis Date Noted   Major depressive disorder, recurrent episode, moderate ([place]) 02/07/2022   Sepsis secondary to UTI ([place]) 05/01/2020   VP (ventriculoperitoneal) shunt status 05/01/2020   Right ovarian cyst 05/01/2020   Elevated LFTs 05/01/2020   Vaginal spotting 05/01/2020    ONSET DATE: Sept 2023;   REFERRING DIAG:  I63.9 (ICD-10-CM) - CVA (cerebral vascular accident) ([place])   THERAPY DIAG:  Muscle weakness (generalized)  Other lack of coordination  Rationale for Evaluation and Treatment: Rehabilitation  SUBJECTIVE:  SUBJECTIVE STATEMENT: Pt reports doing fine today.  Just finished PT session and appears tired. Pt accompanied by: self  PERTINENT HISTORY: Per Dr. Rosana Berger on 11/19/22: HPI [name] presents to establish care.  She is here with her mom.   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       PRECAUTIONS: None  WEIGHT BEARING RESTRICTIONS: No  PAIN:  Are you having pain? Yes: NPRS scale: 6-7/10 Pain location: Entire R side, arm and leg Pain description: pins and needles in the hand, arm is achy, moving the arm causes stabbing pain Aggravating factors: movement of the arm Relieving factors: rest; pt reports medication is minimally effective  FALLS: Has patient fallen in last 6 months? Yes. 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  Being independent."  OBJECTIVE:   HAND DOMINANCE: Left  ADLs: Overall ADLs: mostly performed with the LUE Transfers/ambulation related to ADLs: indep/extra time Eating: cuts food 1 handed Grooming: 1 handed (L) UB Dressing: occasional help to don clothes if tired after a shower;  help with straightening clothes; struggles with clothing fasteners LB Dressing: wearing slip on shoes; difficulty with clothing fasteners  Toileting: indep Bathing: modified indep; pt makes attempts to engage the R arm while bathing but reports this is difficult Tub Shower transfers: modified indep Equipment:  see above  IADLs: Shopping: pt can go shopping accompanied by a family member Light housekeeping: 1 handed Meal Prep: cooking causes fatigue; mostly L handed Community mobility: uses rollator for  community mobility; uses [facility] transportation Medication management: uses weekly pill organizer; Chief Executive Officer: mother manages (pt states she doesn't really have any bills)  Handwriting:  N/A (pt is L hand dominant)  MOBILITY STATUS:  ambulatory without AD; pt seeing PT for LE strengthening and balance deficits  POSTURE COMMENTS:  rounded shoulders Sitting balance: Moves/returns truncal midpoint >2 inches in all planes  ACTIVITY TOLERANCE: Activity tolerance: Pain in the R side significantly limits activity tolerance.  Pt reports she can get fatigued with ADLs and IADLs.  FUNCTIONAL OUTCOME MEASURES: FOTO: 44; 48  UPPER EXTREMITY ROM:    Active ROM Right eval Left Eval WNL  Shoulder flexion 38 (60)   Shoulder abduction    Shoulder adduction 42 (51)   Shoulder extension    Shoulder internal rotation    Shoulder external rotation Hand to back of head (scaption and chin tuck)   Elbow flexion WNL   Elbow extension WNL   Wrist flexion WNL   Wrist extension WNL   Wrist ulnar deviation    Wrist radial deviation    Wrist pronation WNL   Wrist supination WNL   (Blank rows = not tested)  R hand: Able to oppose each digit to thumb with extra time and able to make full composite fist  UPPER EXTREMITY MMT:     MMT Right eval Left Eval 5/5  Shoulder flexion 3-   Shoulder abduction 3-   Shoulder adduction    Shoulder extension    Shoulder internal rotation 3+   Shoulder external rotation 3+   Middle trapezius    Lower trapezius    Elbow flexion 4-   Elbow extension 4   Wrist flexion 4-   Wrist extension 3+   Wrist ulnar deviation    Wrist radial deviation    Wrist pronation    Wrist supination    (Blank rows = not tested)  HAND FUNCTION: Grip strength: Right: 4 lbs; Left: 50 lbs, Lateral pinch: Right: 0 lbs, Left: 17 lbs, and 3 point pinch: Right: 0 lbs, Left: 19 lbs  COORDINATION: Finger Nose Finger test: able to touch nose with increased time 9  Hole Peg test: Right: unable (picked up a peg with repeat trials but unable to move the peg from horizontal to vertical position to place into board) sec; Left: 24 sec  SENSATION: tingling in the R hand  Light touch: Impaired  Proprioception: WFL  EDEMA: N/A  MUSCLE TONE: RUE: Mild and Hypertonic  COGNITION: Overall cognitive status: Within functional limits for tasks assessed  VISION: Subjective report: Pt reports increased blurred vision following her stroke Baseline vision: Wears glasses all the time  VISION ASSESSMENT: Ocular ROM: WFL Gaze preference/alignment: gaze left; slight Tracking/Visual pursuits: Able to track stimulus in all quads without difficulty Saccades: WFL Visual Fields: no apparent deficits  PERCEPTION: WFL  PRAXIS: Impaired: Motor planning  OBSERVATIONS: Pt guards RUE with flexed elbow and shoulder IR/hand to abdomen during mobility.  R scapular elevation noting increased tension in R upper trap.  TODAY'S TREATMENT:  Therapeutic Exercise: Performed passive R scapular depression and retraction, and reps of gentle passive stretching throughout the RUE, including all planes for R shoulder, elbow, forearm, and R wrist and digit flex/ext.  Issued soft blue theraputty and instructed pt in strengthening and coordination exercises for R hand, including gross grasping, lateral/2 point/3 point pinching, digit abd/add, and digging coins out of putty.  Able to return demo with intermittent vc for technique to improve quality of movement.  Encouraged completion 5-10 min, 1-2x per day.    PATIENT EDUCATION: Education details: theraputty exercises Person educated: Patient Education method: Explanation and Verbal cues Education comprehension: verbalized understanding and needs further education  HOME EXERCISE PROGRAM: Soft blue theraputty  exercises  GOALS: Goals reviewed with patient? Yes  SHORT TERM GOALS: Target date: 02/26/23 (6 weeks)  Pt will be indep to perform HEP for increasing RUE flexibility, strength, and coordination to better engage the RUE into daily tasks. Baseline: Not yet initiated Goal status: INITIAL  LONG TERM GOALS: Target date: 04/09/23 (12 weeks)  Pt will increase FOTO score to 48 or better to indicate clinically relevant improvement in self perceived use of R arm for daily tasks.  Baseline: Eval: 44  Goal status: INITIAL  2.  Pt will increase R active shoulder flexion to 100* or better to achieve functional ROM for UB ADLs and reaching for ADL supplies. Baseline: Eval: R 38 degrees (passive 60); pt reaches during ADLs only with the L arm Goal status: INITIAL  3.  Pt will increase R grip strength by 10 or more lbs to enable use of R hand to hold and carry light ADL supplies. Baseline: Eval: R grip 4 lbs, L 50 lbs Goal status: INITIAL  4.  Pt will report <4/10 pain throughout the RUE to enable improved tolerance with using RUE during daily tasks.  Baseline: Eval: 6-7/10 pain throughout the R side (pt predominantly uses L hand for ADLs) Goal status: INITIAL  5.  Pt will increase R hand dexterity/FMC skills to manipulate clothing fasteners with bilat hands. Baseline: Eval: pt avoids clothing fasteners or manages with extra time using L hand only (R 9 hole: unable, L 24 sec) Goal status: INITIAL  ASSESSMENT:  CLINICAL IMPRESSION: Pt appears tired following PT session, but tolerated OT tx well this date.  Pt had initial pain with PROM throughout the RUE, which reduced with gentle reps as noted above.  Pt verbalized that she hadn't seen her R arm raise "this high" (~120*) since prior to her CVA.  Pt was issued theraputty and instructed in R hand strength and coordination exercises; handout issued to begin carryover at home for HEP.  Pt will continue to benefit from skilled OT to reduce R shoulder  stiffness, improve RUE strength and coordination, and reduce pain throughout the RUE in order to maximize functional use of the RUE with daily tasks.   PERFORMANCE DEFICITS: in functional skills including ADLs, IADLs, coordination, dexterity, sensation, tone, ROM, strength, pain, flexibility, Fine motor control, Gross motor control, mobility, balance, body mechanics, decreased knowledge of use of DME, vision, and UE functional use.  IMPAIRMENTS: are limiting patient from ADLs, IADLs, rest and sleep, work, and leisure.   CO-MORBIDITIES: has co-morbidities such as CHF, depression  that affects occupational performance. Patient will benefit from skilled OT to address above impairments and improve overall function.  MODIFICATION OR ASSISTANCE TO COMPLETE EVALUATION: No modification of tasks or assist necessary to complete an evaluation.  OT OCCUPATIONAL PROFILE AND HISTORY: Problem focused assessment: Including  review of records relating to presenting problem.  CLINICAL DECISION MAKING: Moderate - several treatment options, min-mod task modification necessary  REHAB POTENTIAL: Good  EVALUATION COMPLEXITY: Moderate    PLAN:  OT FREQUENCY: 1-2x/week (pending any visit limitations with insurance), but would benefit from 2x/week  OT DURATION: 12 weeks  PLANNED INTERVENTIONS: self care/ADL training, therapeutic exercise, therapeutic activity, neuromuscular re-education, manual therapy, passive range of motion, balance training, moist heat, cryotherapy, patient/family education, coping strategies training, and DME and/or AE instructions  RECOMMENDED OTHER SERVICES: OT recommended pt follow up with her eye doctor as she does report some increased blurred vision since her CVA  CONSULTED AND AGREED WITH PLAN OF CARE: Patient  PLAN FOR NEXT SESSION: see above  Leta Speller, MS, OTR/L   Darleene Cleaver, OT 01/26/2023, 9:55 AM

## 2023-01-27 ENCOUNTER — Ambulatory Visit: Payer: Medicaid Other | Admitting: Physical Therapy

## 2023-01-27 DIAGNOSIS — R2681 Unsteadiness on feet: Secondary | ICD-10-CM | POA: Diagnosis not present

## 2023-01-27 DIAGNOSIS — R278 Other lack of coordination: Secondary | ICD-10-CM | POA: Diagnosis not present

## 2023-01-27 DIAGNOSIS — R2689 Other abnormalities of gait and mobility: Secondary | ICD-10-CM | POA: Diagnosis not present

## 2023-01-27 DIAGNOSIS — R262 Difficulty in walking, not elsewhere classified: Secondary | ICD-10-CM

## 2023-01-27 DIAGNOSIS — M6281 Muscle weakness (generalized): Secondary | ICD-10-CM

## 2023-01-27 DIAGNOSIS — Z7689 Persons encountering health services in other specified circumstances: Secondary | ICD-10-CM | POA: Diagnosis not present

## 2023-01-27 NOTE — Therapy (Signed)
OUTPATIENT PHYSICAL THERAPY TREATMENT   Patient Name: Anna Tapia MRN: [id number] DOB:01/10/1991, 32 y.o., female Today's Date: 01/27/2023   PCP: Teodora Medici, DO  REFERRING PROVIDER: Teodora Medici, DO   END OF SESSION:  PT End of Session - 01/27/23 1111     Visit Number 9    Number of Visits 24    Date for PT Re-Evaluation 03/20/23    Authorization Type [place] MEDICA[employer] PREPAID HEALTH PLAN    Authorization Time Period 12/26/22-03/20/23    Progress Note Due on Visit 10    PT Start Time 1108    PT Stop Time 1149    PT Time Calculation (min) 41 min    Equipment Utilized During Treatment Gait belt    Activity Tolerance Patient tolerated treatment well;No increased pain    Behavior During Therapy WFL for tasks assessed/performed                Past Medical History:  Diagnosis Date   Asthma    Brain aneurysm    CHF (congestive heart failure) (HCC)    Stroke [facility])    Past Surgical History:  Procedure Laterality Date   VENTRICULOPERITONEAL SHUNT     Patient Active Problem List   Diagnosis Date Noted   Major depressive disorder, recurrent episode, moderate ([place]) 02/07/2022   Sepsis secondary to UTI ([place]) 05/01/2020   VP (ventriculoperitoneal) shunt status 05/01/2020   Right ovarian cyst 05/01/2020   Elevated LFTs 05/01/2020   Vaginal spotting 05/01/2020    ONSET DATE: 07/24/2022  REFERRING DIAG: I69.30 (ICD-10-CM) - History of CVA with residual deficit  THERAPY DIAG:  Muscle weakness (generalized)  Unsteadiness on feet  Other lack of coordination  Balance disorder  Difficulty in walking, not elsewhere classified  Other abnormalities of gait and mobility  Rationale for Evaluation and Treatment: Rehabilitation  SUBJECTIVE:                                                                                                                                                                                             SUBJECTIVE STATEMENT:  Pt  report mild pain in the RLE this AM, but has since resloved. Constant 7/10 pain in the RUE, especially with Weight bearing. No other new updates.   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  PAIN:  Are you having pain? Yes see above  RUE   PRECAUTIONS: None  WEIGHT BEARING RESTRICTIONS: No  FALLS: Has patient fallen in last 6 months? Yes. Number of falls 1, during the stroke and fell in the shower.  PATIENT GOALS: Get more mobility in the arm and less pain in the leg.  OBJECTIVE:   TODAY'S TREATMENT:                                                                                                                              DATE: 01/27/23  Pt performed gait training with no AD x 6ft, 53ft, and 438ft. Cues for improved hip ER and ankle eversion with mild improvement initially following instruction. Instruction and tactile cues for improved shoulder and RUE swing intermittently.   Seated RLE stretch to HS 2 x 30 sec, figure 4 2 x 30 sec, piriformis 2 x 30 sec   Ankle Eversion 2 x 20  Side stepping R and L over 3 canes on floor 2 x  4 bil. Min assist for posture and to prevent trunkal rotation with side stepping. No R knee instability noted.  Forward/reverse stepping over 3 canes on floor x 2 bil.   Step up/down 6 inch step x 5 bil with BUE support to force use of RUE. Reciprocal ascent/descent of 4 steps(6"). Mild hip weakness on the R with slight [place] while leading with the LLE.        PATIENT EDUCATION: Education details: pt educated throughout session about proper posture and technique with exercises. Improved exercise technique, movement at target joints, use of target muscles after min to mod verbal, visual, tactile cues.  Person educated: Patient Education method: Explanation Education comprehension: verbalized understanding  HOME EXERCISE PROGRAM:   Access Code: [email or web address] URL: [email or web address] Date: 01/07/2023 Prepared by: Barrie Folk  Exercises - Staggered Sit-to-Stand with BUE push from RLE - 1 x daily - 7 x weekly - 3 sets - 5 reps  Access Code: [email or web address] URL: [email or web address] Date: 01/22/2023 Prepared by: Barrie Folk  Exercises - Seated Figure 4 Piriformis Stretch  - 1 x daily - 7 x weekly - 2 sets - 3 reps - 60 sec hold  GOALS: Goals reviewed with patient? Yes  SHORT TERM GOALS: Target date: 01/23/2023  Pt will be independent with HEP in order to demonstrate increased ability to perform tasks related to occupation/hobbies. Baseline: Pt not given HEP at initial evaluation. Goal status: INITIAL   LONG TERM GOALS: Target date: 03/20/2023  1.  Patient (> 61 years old) will complete five times sit to stand test in < 15 seconds indicating an increased LE strength and improved balance. Baseline: 15.44 sec Goal status: INITIAL  2.  Patient will increase FOTO score to equal to or greater than 58  to demonstrate statistically significant improvement in mobility and quality of life.  Baseline: 48 Goal status: INITIAL   3.  Patient will increase FGA score by > 4 points to demonstrate decreased fall risk during functional activities. Baseline: 15/30 Goal status: INITIAL   4.  Patient will reduce timed up and go to <11 seconds to reduce fall risk and demonstrate improved transfer/gait ability. Baseline: 15.65 sec Goal status: INITIAL  5.  Patient will increase 10 meter walk test to >1.72m/s as to  improve gait speed for better community ambulation and to reduce fall risk. Baseline: 15.46 sec Goal status: INITIAL  6.  Patient will increase six minute walk test distance to >1000 for progression to community ambulator and improve gait ability Baseline: 48' with 2 standing rest breaks, no AD Goal status: INITIAL    ASSESSMENT:  CLINICAL IMPRESSION:  Pt continues to put forth good effort in PT sessions. Reports feeling like her legs ar slightly stronger since starting PT, and she is able to stand on her legs without getting tired as quickly. Noted to have improved ROM in L hip into figure 4 on this day, as well as no knee instability noted with high level functional task of reciprocal stair climb. Will benefit from continued skilled PT interventions to address balance, strength, and coordination deficits to improve QoL and improve function and safety with IADLs.     OBJECTIVE IMPAIRMENTS: Abnormal gait, decreased activity tolerance, decreased balance, decreased endurance, decreased mobility, difficulty walking, decreased ROM, decreased strength, and pain.   ACTIVITY LIMITATIONS: carrying, lifting, bending, standing, squatting, sleeping, bathing, toileting, reach over head, hygiene/grooming, and locomotion level  PARTICIPATION LIMITATIONS: meal prep, cleaning, laundry, driving, shopping, community activity, and yard work  PERSONAL FACTORS: Age, Past/current experiences, Social background, Time since onset of injury/illness/exacerbation, Transportation, and 3+ comorbidities: asthma, depression, CHF  are also affecting patient's functional outcome.   REHAB POTENTIAL: Good  CLINICAL DECISION MAKING: Stable/uncomplicated  EVALUATION COMPLEXITY: Moderate  PLAN:  PT FREQUENCY: 1-2x/week, preferably 2x/week but due to insurance, may only be able to be seen 1x/week  PT DURATION: 12 weeks  PLANNED INTERVENTIONS: Therapeutic exercises, Therapeutic activity, Neuromuscular re-education,  Balance training, Gait training, Patient/Family education, Self Care, Joint mobilization, Joint manipulation, Stair training, Vestibular training, Canalith repositioning, Orthotic/Fit training, DME instructions, Aquatic Therapy, Dry Needling, Electrical stimulation, Spinal manipulation, Spinal mobilization, Cryotherapy, Moist heat, Taping, Manual therapy, and Re-evaluation   PLAN FOR NEXT SESSION:  Treadmill training. High intensity gait and balance training.     Barrie Folk PT, DPT  Physical Therapist - [facility]  1:10 PM 01/27/23     1:10 PM 01/27/23

## 2023-01-30 ENCOUNTER — Ambulatory Visit: Payer: Medicaid Other | Admitting: Physical Therapy

## 2023-01-30 DIAGNOSIS — R2681 Unsteadiness on feet: Secondary | ICD-10-CM

## 2023-01-30 DIAGNOSIS — R278 Other lack of coordination: Secondary | ICD-10-CM | POA: Diagnosis not present

## 2023-01-30 DIAGNOSIS — R2689 Other abnormalities of gait and mobility: Secondary | ICD-10-CM | POA: Diagnosis not present

## 2023-01-30 DIAGNOSIS — M6281 Muscle weakness (generalized): Secondary | ICD-10-CM | POA: Diagnosis not present

## 2023-01-30 DIAGNOSIS — R262 Difficulty in walking, not elsewhere classified: Secondary | ICD-10-CM | POA: Diagnosis not present

## 2023-01-30 DIAGNOSIS — Z7689 Persons encountering health services in other specified circumstances: Secondary | ICD-10-CM | POA: Diagnosis not present

## 2023-01-30 NOTE — Therapy (Signed)
OUTPATIENT PHYSICAL THERAPY TREATMENT/ PHYSICAL THERAPY PROGRESS NOTE   Dates of reporting period  12/26/22   to  01/30/2023      Patient Name: Anna Tapia MRN: [id number] DOB:Mar 11, 1991, 32 y.o., female Today's Date: 01/30/2023   PCP: Teodora Medici, DO  REFERRING PROVIDER: Teodora Medici, DO   END OF SESSION:  PT End of Session - 01/30/23 1154     Visit Number 10    Number of Visits 24    Date for PT Re-Evaluation 03/20/23    Authorization Type [place] MEDICAID PREPAID HEALTH PLAN    Authorization Time Period 12/26/22-03/20/23    Progress Note Due on Visit 20    PT Start Time 1146    PT Stop Time 1230    PT Time Calculation (min) 44 min    Equipment Utilized During Treatment Gait belt    Activity Tolerance Patient tolerated treatment well;No increased pain    Behavior During Therapy WFL for tasks assessed/performed                Past Medical History:  Diagnosis Date   Asthma    Brain aneurysm    CHF (congestive heart failure) (HCC)    [facility])    Past Surgical History:  Procedure Laterality Date   VENTRICULOPERITONEAL SHUNT     Patient Active Problem List   Diagnosis Date Noted   Major depressive disorder, recurrent episode, moderate ([place]) 02/07/2022   Sepsis secondary to UTI ([place]) 05/01/2020   VP (ventriculoperitoneal) shunt status 05/01/2020   Right ovarian cyst 05/01/2020   Elevated LFTs 05/01/2020   Vaginal spotting 05/01/2020    ONSET DATE: 07/24/2022  REFERRING DIAG: I69.30 (ICD-10-CM) - History of CVA with residual deficit  THERAPY DIAG:  Muscle weakness (generalized)  Unsteadiness on feet  Other lack of coordination  Balance disorder  Difficulty in walking, not elsewhere classified  Rationale for Evaluation and Treatment: Rehabilitation  SUBJECTIVE:                                                                                                                                                                                              SUBJECTIVE STATEMENT:  Pt reports fall in bathroom this morning. No head trauma, no pain, no open wounds. Pt reports that she was trying to change pants in standing and lost balance, falling into the tub.   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  PAIN:  Are you having pain? Yes see above  RUE   PRECAUTIONS: None  WEIGHT BEARING RESTRICTIONS: No  FALLS: Has patient fallen in last 6 months? Yes. Number of falls 1, during the stroke and fell in the shower.  PATIENT GOALS: Get more mobility in the arm and less pain in the leg.  OBJECTIVE:   OBJECTIVE:    DIAGNOSTIC FINDINGS:      CLINICAL DATA:  Initial evaluation for intracranial hemorrhage.   EXAM: CT ANGIOGRAPHY HEAD AND NECK    IMPRESSION: 1. 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 Again, prominent arterial contribution to the AVM is seen from the left ACA and posterior circulation, with primary venous drainage into the deep venous system. Associated 7 mm aneurysm along the right anterolateral aspect of the AVM as above. 2. Diffuse tortuosity and ectasia elsewhere about the major arterial vasculature of the head and neck. No large vessel occlusion or hemodynamically significant stenosis. No other acute vascular abnormality.   COGNITION: Overall cognitive status: Within functional limits for tasks assessed             SENSATION: WFL    COORDINATION: Pt limited with quick movements during supination/pronation of the wrist on the R side and also with thumb to finger movements.   POSTURE: rounded shoulders   LOWER EXTREMITY ROM:      Active  Right Eval Left Eval  Hip flexion      Hip extension      Hip abduction      Hip adduction      Hip internal rotation      Hip external rotation      Knee flexion      Knee extension      Ankle dorsiflexion      Ankle plantarflexion      Ankle inversion      Ankle eversion       (Blank rows = not tested)   LOWER EXTREMITY MMT:     MMT Right Eval Left Eval  Hip flexion 4 4+  Hip abduction 4 4+  Hip adduction 4- 4-  Knee flexion 4- 4+  Knee extension 4 4+  Ankle dorsiflexion 4 4+  (Blank rows = not tested)     TRANSFERS: Assistive device utilized: None  Sit to stand: Complete Independence Stand to sit: Complete Independence Chair to chair: Complete Independence   GAIT: Gait pattern: step through pattern, decreased arm swing- Right, decreased step length- Left, decreased stance time- Right, decreased stride length, decreased hip/knee flexion- Right, decreased ankle dorsiflexion- Right, and circumduction- Right Distance walked: 40' Assistive device utilized: None Level of assistance: Complete Independence Comments: Pt able to ambulate safely without AD.   FUNCTIONAL TESTS:  5 times sit to stand: 12/26/22 15.44 sec. 01/30/23: 11.51 sec  Timed up and go (TUG): 15.65 sec 01/30/23: 10.99 sec (average of 2 trials)   6 minute walk test: 619' 2 standing rest breaks 01/30/23 924' no AD and no rest breaks  10 meter walk test: 15.46 sec 01/30/23: normal:13.06sec(.59m/s) and fast: 11.7(.14m/s) Functional gait assessment: 15/30. 01/30/23:  18/30   PATIENT SURVEYS:  FOTO 48/58/ 01/30/23: 51  TODAY'S TREATMENT:                                                                                                                              DATE: 01/30/23  PT assessed  progress towards LTG. See above for details in improvement.    PATIENT EDUCATION: Education details: pt educated throughout session about proper posture and technique with exercises. Improved exercise technique, movement at target joints, use of target muscles after min to mod verbal, visual, tactile cues.  Person educated: Patient Education method: Explanation Education comprehension: verbalized understanding  HOME EXERCISE PROGRAM:   Access Code: [email or web address] URL: [email or web address] Date: 01/07/2023 Prepared by: Barrie Folk  Exercises - Staggered Sit-to-Stand with BUE push from RLE - 1 x daily - 7 x weekly - 3 sets - 5 reps  Access Code: [email or web address] URL: [email or web address] Date: 01/22/2023 Prepared by: Barrie Folk  Exercises - Seated Figure 4 Piriformis Stretch  - 1 x daily - 7 x weekly - 2 sets - 3 reps - 60 sec hold  GOALS: Goals reviewed with patient? Yes  SHORT TERM GOALS: Target date: 01/23/2023  Pt will be independent with HEP in order to demonstrate increased ability to perform tasks related to occupation/hobbies. Baseline: Pt not given HEP at initial evaluation. Goal status: INITIAL   LONG TERM GOALS: Target date: 03/20/2023  1.  Patient (> 34 years old) will complete five times sit to stand test in < 15 seconds indicating an increased LE strength and improved balance. Baseline: 15.44 sec Goal status: INITIAL  2.  Patient will increase FOTO score to equal to or greater than 58 to demonstrate statistically significant improvement in mobility and quality of life.  Baseline: 48 Goal status: INITIAL   3.  Patient will increase FGA score by > 4 points to demonstrate decreased fall risk during functional activities. Baseline: 15/30 Goal status: INITIAL   4.  Patient will reduce timed up and go to <11 seconds to reduce fall risk and demonstrate improved transfer/gait ability. Baseline: 15.65 sec Goal status: INITIAL  5.  Patient  will increase 10 meter walk test to >1.69m/s as to improve gait speed for better community ambulation and to reduce fall risk. Baseline: 15.46 sec Goal status: INITIAL  6.  Patient will increase six minute walk test distance to >1000 for progression to community ambulator and improve gait ability Baseline: 49' with 2 standing rest breaks, no AD Goal status: INITIAL    ASSESSMENT:  CLINICAL IMPRESSION:  Pt continues to put forth good effort in PT sessions. Fall at home prior to Treatment session. No trauma or pain reported. Reports feeling like her legs ar slightly stronger since starting PT, improved balance, strength and function as indicated on improved scores for 5xSTS, 61minWT, 10 meter walk test, TUG and Foto. Patient's condition has the potential to improve in response to therapy. Maximum improvement is yet to be obtained. The anticipated improvement is attainable and reasonable in a generally predictable  time. Will benefit from continued skilled PT interventions to address balance, strength, and coordination deficits to improve QoL and improve function and safety with IADLs.     OBJECTIVE IMPAIRMENTS: Abnormal gait, decreased activity tolerance, decreased balance, decreased endurance, decreased mobility, difficulty walking, decreased ROM, decreased strength, and pain.   ACTIVITY LIMITATIONS: carrying, lifting, bending, standing, squatting, sleeping, bathing, toileting, reach over head, hygiene/grooming, and locomotion level  PARTICIPATION LIMITATIONS: meal prep, cleaning, laundry, driving, shopping, community activity, and yard work  PERSONAL FACTORS: Age, Past/current experiences, Social background, Time since onset of injury/illness/exacerbation, Transportation, and 3+ comorbidities: asthma, depression, CHF  are also affecting patient's functional outcome.   REHAB POTENTIAL: Good  CLINICAL DECISION MAKING: Stable/uncomplicated  EVALUATION COMPLEXITY: Moderate  PLAN:  PT  FREQUENCY: 1-2x/week, preferably 2x/week but due to insurance, may only be able to be seen 1x/week  PT DURATION: 12 weeks  PLANNED INTERVENTIONS: Therapeutic exercises, Therapeutic activity, Neuromuscular re-education, Balance training, Gait training, Patient/Family education, Self Care, Joint mobilization, Joint manipulation, Stair training, Vestibular training, Canalith repositioning, Orthotic/Fit training, DME instructions, Aquatic Therapy, Dry Needling, Electrical stimulation, Spinal manipulation, Spinal mobilization, Cryotherapy, Moist heat, Taping, Manual therapy, and Re-evaluation   PLAN FOR NEXT SESSION:   High intensity gait and balance training.  Blaze pods.    Barrie Folk PT, DPT  Physical Therapist - [facility]  11:57 AM 01/30/23     11:57 AM 01/30/23

## 2023-02-03 ENCOUNTER — Ambulatory Visit: Payer: Medicaid Other | Attending: Internal Medicine | Admitting: Physical Therapy

## 2023-02-03 DIAGNOSIS — R262 Difficulty in walking, not elsewhere classified: Secondary | ICD-10-CM | POA: Diagnosis not present

## 2023-02-03 DIAGNOSIS — R2689 Other abnormalities of gait and mobility: Secondary | ICD-10-CM | POA: Diagnosis not present

## 2023-02-03 DIAGNOSIS — Z419 Encounter for procedure for purposes other than remedying health state, unspecified: Secondary | ICD-10-CM | POA: Diagnosis not present

## 2023-02-03 DIAGNOSIS — R2681 Unsteadiness on feet: Secondary | ICD-10-CM | POA: Insufficient documentation

## 2023-02-03 DIAGNOSIS — M6281 Muscle weakness (generalized): Secondary | ICD-10-CM | POA: Insufficient documentation

## 2023-02-03 DIAGNOSIS — R278 Other lack of coordination: Secondary | ICD-10-CM | POA: Diagnosis not present

## 2023-02-03 DIAGNOSIS — Z7689 Persons encountering health services in other specified circumstances: Secondary | ICD-10-CM | POA: Diagnosis not present

## 2023-02-03 NOTE — Therapy (Signed)
OUTPATIENT PHYSICAL THERAPY TREATMENT/ PHYSICAL THERAPY PROGRESS NOTE   Dates of reporting period  12/26/22   to  01/30/2023      Patient Name: Anna Tapia MRN: [id number] DOB:[birth year], 32 y.o., female Today's Date: 02/03/2023   PCP: Teodora Medici, DO  REFERRING PROVIDER: Teodora Medici, DO   END OF SESSION:  PT End of Session - 02/03/23 1526     Number of Visits 24    Date for PT Re-Evaluation 03/20/23    Authorization Type [place] MEDICA[employer] PREPAID HEALTH PLAN    Authorization Time Period 12/26/22-03/20/23    Progress Note Due on Visit 20    Equipment Utilized During Treatment Gait belt    Activity Tolerance Patient tolerated treatment well;No increased pain    Behavior During Therapy WFL for tasks assessed/performed                Past Medical History:  Diagnosis Date   Asthma    Brain aneurysm    CHF (congestive heart failure) (HCC)    Stroke [facility])    Past Surgical History:  Procedure Laterality Date   VENTRICULOPERITONEAL SHUNT     Patient Active Problem List   Diagnosis Date Noted   Major depressive disorder, recurrent episode, moderate 02/07/2022   Sepsis secondary to UTI 05/01/2020   VP (ventriculoperitoneal) shunt status 05/01/2020   Right ovarian cyst 05/01/2020   Elevated LFTs 05/01/2020   Vaginal spotting 05/01/2020    ONSET DATE: 07/24/2022  REFERRING DIAG: I69.30 (ICD-10-CM) - History of CVA with residual deficit  THERAPY DIAG:  Muscle weakness (generalized)  Unsteadiness on feet  Other lack of coordination  Balance disorder  Difficulty in walking, not elsewhere classified  Rationale for Evaluation and Treatment: Rehabilitation  SUBJECTIVE:                                                                                                                                                                                             SUBJECTIVE STATEMENT:  Pt reports fall in bathroom this morning. No head trauma, no pain,  no open wounds. Pt reports that she was trying to change pants in standing and lost balance, falling into the tub.   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  PAIN:  Are you having pain? Yes see above  RUE   PRECAUTIONS: None  WEIGHT BEARING RESTRICTIONS: No  FALLS: Has patient fallen in last 6 months? Yes. Number of falls 1, during the stroke and fell in the shower.  PATIENT GOALS: Get more mobility in the arm and less pain in the leg.  OBJECTIVE:   OBJECTIVE:    DIAGNOSTIC FINDINGS:      CLINICAL DATA:  Initial evaluation for intracranial hemorrhage.   EXAM: CT ANGIOGRAPHY HEAD AND NECK    IMPRESSION: 1. 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 Again, prominent arterial contribution to the AVM is seen from the left ACA and posterior circulation, with primary venous drainage into the deep venous system. Associated 7 mm aneurysm along the right anterolateral aspect of the AVM as above. 2. Diffuse tortuosity and ectasia elsewhere about the major arterial vasculature of the head and neck. No large vessel occlusion or hemodynamically significant stenosis. No other acute vascular abnormality.   COGNITION: Overall cognitive status: Within functional limits for tasks assessed             SENSATION: WFL   COORDINATION: Pt limited with quick movements during supination/pronation of the wrist on the R side and also with thumb to  finger movements.   POSTURE: rounded shoulders   LOWER EXTREMITY ROM:      Active  Right Eval Left Eval  Hip flexion      Hip extension      Hip abduction      Hip adduction      Hip internal rotation      Hip external rotation      Knee flexion      Knee extension      Ankle dorsiflexion      Ankle plantarflexion      Ankle inversion      Ankle eversion       (Blank rows = not tested)   LOWER EXTREMITY MMT:     MMT Right Eval Left Eval  Hip flexion 4 4+  Hip abduction 4 4+  Hip adduction 4- 4-  Knee flexion 4- 4+  Knee extension 4 4+  Ankle dorsiflexion 4 4+  (Blank rows = not tested)     TRANSFERS: Assistive device utilized: None  Sit to stand: Complete Independence Stand to sit: Complete Independence Chair to chair: Complete Independence   GAIT: Gait pattern: step through pattern, decreased arm swing- Right, decreased step length- Left, decreased stance time- Right, decreased stride length, decreased hip/knee flexion- Right, decreased ankle dorsiflexion- Right, and circumduction- Right Distance walked: 40' Assistive device utilized: None Level of assistance: Complete Independence Comments: Pt able to ambulate safely without AD.   FUNCTIONAL TESTS:  5 times sit to stand: 12/26/22 15.44 sec. 01/30/23: 11.51 sec  Timed up and go (TUG): 15.65 sec 01/30/23: 10.99 sec (average of 2 trials)   6 minute walk test: 619' 2 standing rest breaks 01/30/23 924' no AD and no rest breaks  10 meter walk test: 15.46 sec 01/30/23: normal:13.06sec(.97m/s) and fast: 11.7(.80m/s) Functional gait assessment: 15/30. 01/30/23:  18/30   PATIENT SURVEYS:  FOTO 48/58/ 01/30/23: 51     TODAY'S TREATMENT:  DATE: 02/03/23  Seated NMR:  LAQ 3#A ankle weight x 12  Reciprocal march 3#  ankle weight  12  Hip abduction 3# ankle weight and RTB, cramp in the LLE, then  performed with manual resistance x 12   Dynamic balance/variable gait training with 3# ankle weight,  Forward/reverse 3     PATIENT EDUCATION: Education details: pt educated throughout session about proper posture and technique with exercises. Improved exercise technique, movement at target joints, use of target muscles after min to mod verbal, visual, tactile cues.  Person educated: Patient Education method: Explanation Education comprehension: verbalized understanding  HOME EXERCISE PROGRAM:   Access Code: [email or web address] URL: [email or web address] Date: 01/07/2023 Prepared by: Barrie Folk  Exercises - Staggered Sit-to-Stand with BUE push from RLE - 1 x daily - 7 x weekly - 3 sets - 5 reps  Access Code: [email or web address] URL: [email or web address] Date: 01/22/2023 Prepared by: Barrie Folk  Exercises - Seated Figure 4 Piriformis Stretch  - 1 x daily - 7 x weekly - 2 sets - 3 reps - 60 sec hold  GOALS: Goals reviewed with patient? Yes  SHORT TERM GOALS: Target date: 01/23/2023  Pt will be independent with HEP in order to demonstrate increased ability to perform tasks related to occupation/hobbies. Baseline: Pt not given HEP at initial evaluation. Goal status: MET   LONG TERM GOALS: Target date: 03/20/2023  1.  Patient (> 67 years old) will complete five times sit to stand test in < 15 seconds indicating an increased LE strength and improved balance. Baseline: 15.44 sec. 3/28 11.51 sec  Goal status: MET   2.  Patient will increase FOTO score to equal to or greater than 58 to demonstrate statistically significant improvement in mobility and quality of life.  Baseline: 48 01/30/23: 51  Goal status: Progressing    3.  Patient will increase FGA score by > 4 points to demonstrate decreased fall risk during functional activities. Baseline: 15/30. 01/30/23:  18/30 Goal status: Progressing    4.  Patient will reduce timed up and go to <11 seconds to reduce  fall risk and demonstrate improved transfer/gait ability. Baseline: 15.65 sec. 01/30/23: 10.99 sec  Goal status:  MET   5.  Patient will increase 10 meter walk test to >1.29m/s as to improve gait speed for better community ambulation and to reduce fall risk. Baseline: 15.46 sec. 01/30/23: normal:13.06sec(.33m/s) Goal status: INITIAL  6.  Patient will increase six minute walk test distance to >1000 for progression to community ambulator and improve gait ability Baseline: 40' with 2 standing rest breaks, no AD. 01/30/23 924' no AD and no rest breaks  Goal status: progressing     ASSESSMENT:  CLINICAL IMPRESSION:  Pt continues to put forth good effort in PT sessions. Fall at home prior to Treatment session. No trauma or pain reported. Reports feeling like her legs ar slightly stronger since starting PT, improved balance, strength and function as indicated on improved scores for 5xSTS, 69minWT, 10 meter walk test, TUG and Foto. Patient's condition has the potential to improve in response to therapy. Maximum improvement is yet to be obtained. The anticipated improvement is attainable and reasonable in a generally predictable time. Will benefit from continued skilled PT interventions to address balance, strength, and coordination deficits to improve QoL and improve function and safety with IADLs.     OBJECTIVE IMPAIRMENTS: Abnormal gait, decreased activity tolerance, decreased balance, decreased endurance, decreased mobility, difficulty walking, decreased ROM, decreased strength, and pain.   ACTIVITY LIMITATIONS: carrying, lifting,  bending, standing, squatting, sleeping, bathing, toileting, reach over head, hygiene/grooming, and locomotion level  PARTICIPATION LIMITATIONS: meal prep, cleaning, laundry, driving, shopping, community activity, and yard work  PERSONAL FACTORS: Age, Past/current experiences, Social background, Time since onset of injury/illness/exacerbation, Transportation, and 3+  comorbidities: asthma, depression, CHF  are also affecting patient's functional outcome.   REHAB POTENTIAL: Good  CLINICAL DECISION MAKING: Stable/uncomplicated  EVALUATION COMPLEXITY: Moderate  PLAN:  PT FREQUENCY: 1-2x/week, preferably 2x/week but due to insurance, may only be able to be seen 1x/week  PT DURATION: 12 weeks  PLANNED INTERVENTIONS: Therapeutic exercises, Therapeutic activity, Neuromuscular re-education, Balance training, Gait training, Patient/Family education, Self Care, Joint mobilization, Joint manipulation, Stair training, Vestibular training, Canalith repositioning, Orthotic/Fit training, DME instructions, Aquatic Therapy, Dry Needling, Electrical stimulation, Spinal manipulation, Spinal mobilization, Cryotherapy, Moist heat, Taping, Manual therapy, and Re-evaluation   PLAN FOR NEXT SESSION:   High intensity gait and balance training.  Blaze pods.    Barrie Folk PT, DPT  Physical Therapist - [facility]  3:28 PM 02/03/23     3:28 PM 02/03/23

## 2023-02-05 ENCOUNTER — Ambulatory Visit: Payer: Medicaid Other

## 2023-02-05 ENCOUNTER — Ambulatory Visit: Payer: Medicaid Other | Admitting: Physical Therapy

## 2023-02-05 DIAGNOSIS — R262 Difficulty in walking, not elsewhere classified: Secondary | ICD-10-CM | POA: Diagnosis not present

## 2023-02-05 DIAGNOSIS — R278 Other lack of coordination: Secondary | ICD-10-CM

## 2023-02-05 DIAGNOSIS — R2681 Unsteadiness on feet: Secondary | ICD-10-CM

## 2023-02-05 DIAGNOSIS — R2689 Other abnormalities of gait and mobility: Secondary | ICD-10-CM | POA: Diagnosis not present

## 2023-02-05 DIAGNOSIS — M6281 Muscle weakness (generalized): Secondary | ICD-10-CM

## 2023-02-05 DIAGNOSIS — Z7689 Persons encountering health services in other specified circumstances: Secondary | ICD-10-CM | POA: Diagnosis not present

## 2023-02-05 NOTE — Therapy (Signed)
OUTPATIENT PHYSICAL THERAPY TREATMENT    Patient Name: Anna Tapia MRN: [id number] DOB:14-Nov-1990, 32 y.o., female Today's Date: 02/05/2023   PCP: Teodora Medici, DO  REFERRING PROVIDER: Teodora Medici, DO   END OF SESSION:   PT End of Session - 02/05/23 1525     Visit Number 12    Number of Visits 24    Date for PT Re-Evaluation 03/20/23    Authorization Type [place] MEDICAID PREPAID HEALTH PLAN    Authorization Time Period 12/26/22-03/20/23    Progress Note Due on Visit 20    PT Start Time 1518    Equipment Utilized During Treatment Gait belt    Activity Tolerance Patient tolerated treatment well;No increased pain    Behavior During Therapy WFL for tasks assessed/performed                Past Medical History:  Diagnosis Date   Asthma    Brain aneurysm    CHF (congestive heart failure) (HCC)    Stroke [facility])    Past Surgical History:  Procedure Laterality Date   VENTRICULOPERITONEAL SHUNT     Patient Active Problem List   Diagnosis Date Noted   Major depressive disorder, recurrent episode, moderate 02/07/2022   Sepsis secondary to UTI 05/01/2020   VP (ventriculoperitoneal) shunt status 05/01/2020   Right ovarian cyst 05/01/2020   Elevated LFTs 05/01/2020   Vaginal spotting 05/01/2020    ONSET DATE: 07/24/2022  REFERRING DIAG: I69.30 (ICD-10-CM) - History of CVA with residual deficit  THERAPY DIAG:  Muscle weakness (generalized)  Unsteadiness on feet  Other lack of coordination  Balance disorder  Difficulty in walking, not elsewhere classified  Rationale for Evaluation and Treatment: Rehabilitation  SUBJECTIVE:                                                                                                                                                                                             SUBJECTIVE STATEMENT:  Pt reports that she is doing Okay. No falls. Consistent pain in RUE 4-5/10 as usual. No other updates.    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  PAIN:  Are you having pain? Yes see above  RUE   PRECAUTIONS: None  WEIGHT BEARING RESTRICTIONS: No  FALLS: Has patient fallen in last 6 months? Yes. Number of falls 1, during the stroke and fell in the shower.  PATIENT GOALS: Get more mobility in the arm and less pain in the leg.  OBJECTIVE:   OBJECTIVE:    DIAGNOSTIC FINDINGS:      CLINICAL DATA:  Initial evaluation for intracranial hemorrhage.   EXAM: CT ANGIOGRAPHY HEAD AND NECK    IMPRESSION: 1. 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 Again, prominent arterial contribution to the AVM is seen from the left ACA and posterior circulation, with primary venous drainage into the deep venous system. Associated 7 mm aneurysm along the right anterolateral aspect of the AVM as above. 2. Diffuse tortuosity and ectasia elsewhere about the major arterial vasculature of the head and neck. No large vessel occlusion or hemodynamically significant stenosis. No other acute vascular abnormality.   COGNITION: Overall cognitive status: Within functional limits for tasks assessed             SENSATION: WFL   COORDINATION: Pt limited with quick movements during supination/pronation of the wrist on the R side and also with thumb to finger movements.   POSTURE: rounded shoulders   LOWER EXTREMITY ROM:      Active  Right Eval Left Eval  Hip  flexion      Hip extension      Hip abduction      Hip adduction      Hip internal rotation      Hip external rotation      Knee flexion      Knee extension      Ankle dorsiflexion      Ankle plantarflexion      Ankle inversion      Ankle eversion       (Blank rows = not tested)   LOWER EXTREMITY MMT:     MMT Right Eval Left Eval  Hip flexion 4 4+  Hip abduction 4 4+  Hip adduction 4- 4-  Knee flexion 4- 4+  Knee extension 4 4+  Ankle dorsiflexion 4 4+  (Blank rows = not tested)     TRANSFERS: Assistive device utilized: None  Sit to stand: Complete Independence Stand to sit: Complete Independence Chair to chair: Complete Independence   GAIT: Gait pattern: step through pattern, decreased arm swing- Right, decreased step length- Left, decreased stance time- Right, decreased stride length, decreased hip/knee flexion- Right, decreased ankle dorsiflexion- Right, and circumduction- Right Distance walked: 40' Assistive device utilized: None Level of assistance: Complete Independence Comments: Pt able to ambulate safely without AD.   FUNCTIONAL TESTS:  5 times sit to stand: 12/26/22 15.44 sec. 01/30/23: 11.51 sec  Timed up and go (TUG): 15.65 sec 01/30/23: 10.99 sec (average of 2 trials)   6 minute walk test: 619' 2 standing rest breaks 01/30/23 924' no AD and no rest breaks  10 meter walk test: 15.46 sec 01/30/23: normal:13.06sec(.36m/s) and fast: 11.7(.66m/s) Functional gait assessment: 15/30. 01/30/23:  18/30   PATIENT SURVEYS:  FOTO 48/58/ 01/30/23: 51     TODAY'S TREATMENT:  DATE: 02/05/23  Seated NMR:  Nustep BLE/BUE reciprocal movement training level 3 x 5 min with cues for attention to the RUE   Dynamic resisted gait training from matrix cable machine  Forward 45ft x 3 12.5 lbs.  Reverse 6ft x 3  12.5 lbs Side stepping. 38ft x 3 bil 7.5  lbs Min assist from PT for weight shift with cues for symmetry of step length and stance   PT instructed pt in HEP  Lateral lunge. X 5 bil and UE supported on rail Standing hip flexor stretch x 20 sec bil  Piriformis stretch x 1 min with increasing over pressure with trunk flexion Sit<>stand pushing from RLE x 10 .  Hip abduction x 10 BLE Hip extension x 10 BLE  Cues for posture and proper set up to reduced compensation on the RLE through trunk   PATIENT EDUCATION: Education details: pt educated throughout session about proper posture and technique with exercises. Improved exercise technique, movement at target joints, use of target muscles after min to mod verbal, visual, tactile cues.  Person educated: Patient Education method: Explanation Education comprehension: verbalized understanding  HOME EXERCISE PROGRAM:   Access Code: [email or web address] URL: [email or web address] Date: 01/07/2023 Prepared by: Barrie Folk  Exercises - Staggered Sit-to-Stand with BUE push from RLE - 1 x daily - 7 x weekly - 3 sets - 5 reps  Access Code: [email or web address] URL: [email or web address] Date: 01/22/2023 Prepared by: Barrie Folk  Exercises - Seated Figure 4 Piriformis Stretch  - 1 x daily - 7 x weekly - 2 sets - 3 reps - 60 sec hold   PT instructed pt in HEP  Lateral lunge. X 5 bil and UE supported on rail Standing hip flexor stretch x 20 sec bil  Piriformis stretch x 1 min with increasing over pressure with trunk flexion Sit<>stand pushing from RLE x 10 .  Hip abduction x 10 BLE Hip extension x 10 BLE  Cues for posture and proper set up to reduced compensation on the RLE through trunk   GOALS: Goals reviewed with patient? Yes  SHORT TERM GOALS: Target date: 01/23/2023  Pt will be independent with HEP in order to demonstrate increased ability to perform tasks related to occupation/hobbies. Baseline: Pt not given HEP at initial evaluation. Goal status: MET   LONG  TERM GOALS: Target date: 03/20/2023  1.  Patient (> 44 years old) will complete five times sit to stand test in < 15 seconds indicating an increased LE strength and improved balance. Baseline: 15.44 sec. 3/28 11.51 sec  Goal status: MET   2.  Patient will increase FOTO score to equal to or greater than 58 to demonstrate statistically significant improvement in mobility and quality of life.  Baseline: 48 01/30/23: 51  Goal status: Progressing    3.  Patient will increase FGA score by > 4 points to demonstrate decreased fall risk during functional activities. Baseline: 15/30. 01/30/23:  18/30 Goal status: Progressing    4.  Patient will reduce timed up and go to <11 seconds to reduce fall risk and demonstrate improved transfer/gait ability. Baseline: 15.65 sec. 01/30/23: 10.99 sec  Goal status:  MET   5.  Patient will increase 10 meter walk test to >1.46m/s as to improve gait speed for better community ambulation and to reduce fall risk. Baseline: 15.46 sec. 01/30/23: normal:13.06sec(.35m/s) Goal status: progressing   6.  Patient will increase six minute walk test distance to >1000 for progression to community ambulator and improve gait ability Baseline: 71' with  2 standing rest breaks, no AD. 01/30/23 924' no AD and no rest breaks  Goal status: progressing     ASSESSMENT:  CLINICAL IMPRESSION:  Pt continues to put forth good effort in PT sessions. Pt continues to demonstrate improved balance, and strength on the RLE. PT educated pt on HEP to be completed daily at home. Patient's condition has the potential to improve in response to therapy. Maximum improvement is yet to be obtained. The anticipated improvement is attainable and reasonable in a generally predictable time. Will benefit from continued skilled PT interventions to address balance, strength, and coordination deficits to improve QoL and improve function and safety with IADLs.     OBJECTIVE IMPAIRMENTS: Abnormal gait, decreased  activity tolerance, decreased balance, decreased endurance, decreased mobility, difficulty walking, decreased ROM, decreased strength, and pain.   ACTIVITY LIMITATIONS: carrying, lifting, bending, standing, squatting, sleeping, bathing, toileting, reach over head, hygiene/grooming, and locomotion level  PARTICIPATION LIMITATIONS: meal prep, cleaning, laundry, driving, shopping, community activity, and yard work  PERSONAL FACTORS: Age, Past/current experiences, Social background, Time since onset of injury/illness/exacerbation, Transportation, and 3+ comorbidities: asthma, depression, CHF  are also affecting patient's functional outcome.   REHAB POTENTIAL: Good  CLINICAL DECISION MAKING: Stable/uncomplicated  EVALUATION COMPLEXITY: Moderate  PLAN:  PT FREQUENCY: 1-2x/week, preferably 2x/week but due to insurance, may only be able to be seen 1x/week  PT DURATION: 12 weeks  PLANNED INTERVENTIONS: Therapeutic exercises, Therapeutic activity, Neuromuscular re-education, Balance training, Gait training, Patient/Family education, Self Care, Joint mobilization, Joint manipulation, Stair training, Vestibular training, Canalith repositioning, Orthotic/Fit training, DME instructions, Aquatic Therapy, Dry Needling, Electrical stimulation, Spinal manipulation, Spinal mobilization, Cryotherapy, Moist heat, Taping, Manual therapy, and Re-evaluation   PLAN FOR NEXT SESSION:   High intensity gait and balance training. Step up/down with eccentric bias.    Barrie Folk PT, DPT  Physical Therapist - [facility]  3:31 PM 02/05/23

## 2023-02-06 NOTE — Therapy (Signed)
OUTPATIENT OCCUPATIONAL THERAPY NEURO TREATMENT NOTE  Patient Name: Anna Tapia MRN: [id number] DOB:June 26, 1991, 32 y.o., female Today's Date: 02/06/2023  PCP: Dr. Teodora Tapia REFERRING PROVIDER: Dr. Teodora Tapia  END OF SESSION:  OT End of Session - 02/06/23 1318     Visit Number 3    Number of Visits 24    Date for OT Re-Evaluation 04/09/23    Progress Note Due on Visit 10    OT Start Time 1600    OT Stop Time 1645    OT Time Calculation (min) 45 min    Activity Tolerance Patient tolerated treatment well    Behavior During Therapy [facility] for tasks assessed/performed             Past Medical History:  Diagnosis Date   Asthma    Brain aneurysm    CHF (congestive heart failure) ([place])    Stroke [facility])    Past Surgical History:  Procedure Laterality Date   VENTRICULOPERITONEAL SHUNT     Patient Active Problem List   Diagnosis Date Noted   Major depressive disorder, recurrent episode, moderate 02/07/2022   Sepsis secondary to UTI 05/01/2020   VP (ventriculoperitoneal) shunt status 05/01/2020   Right ovarian cyst 05/01/2020   Elevated LFTs 05/01/2020   Vaginal spotting 05/01/2020    ONSET DATE: Sept 2023;   REFERRING DIAG:  I63.9 (ICD-10-CM) - CVA (cerebral vascular accident) ([place])   THERAPY DIAG:  Muscle weakness (generalized)  Other lack of coordination  Rationale for Evaluation and Treatment: Rehabilitation  SUBJECTIVE:  SUBJECTIVE STATEMENT: Pt reports she's using her R arm more to help bathe the L arm, and did some cooking this weekend. Pt accompanied by: self  PERTINENT HISTORY: Per Dr. Rosana Tapia on 11/19/22: HPI Anna Tapia presents to establish care.  She is here with her mom.   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       PRECAUTIONS: None  WEIGHT BEARING RESTRICTIONS: No  PAIN:  Are you having pain? Yes: NPRS scale: 6-7/10 Pain location: Entire R side, arm and leg Pain description: pins and needles in the hand, arm is achy, moving the arm causes stabbing pain Aggravating factors: movement of the arm Relieving factors: rest; pt reports medication is minimally effective  FALLS: Has patient fallen in last 6 months? Yes. 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  Being independent."  OBJECTIVE:   HAND DOMINANCE: Left  ADLs: Overall ADLs: mostly performed with the LUE Transfers/ambulation related to ADLs: indep/extra time Eating: cuts food 1 handed Grooming: 1 handed (L) UB Dressing: occasional help to don clothes  if tired after a shower; help with straightening clothes; struggles with clothing fasteners LB Dressing: wearing slip on shoes; difficulty with clothing fasteners  Toileting: indep Bathing: modified indep; pt makes attempts to engage the R arm while bathing but reports this is difficult Tub Shower transfers: modified indep Equipment:  see above  IADLs: Shopping: pt can go shopping accompanied by a family member Light housekeeping: 1 handed Meal Prep: cooking causes fatigue; mostly L handed Community mobility: uses  rollator for community mobility; uses [employer] transportation Medication management: uses weekly pill organizer; Chief Executive Officer: mother manages (pt states she doesn't really have any bills)  Handwriting:  N/A (pt is L hand dominant)  MOBILITY STATUS:  ambulatory without AD; pt seeing PT for LE strengthening and balance deficits  POSTURE COMMENTS:  rounded shoulders Sitting balance: Moves/returns truncal midpoint >2 inches in all planes  ACTIVITY TOLERANCE: Activity tolerance: Pain in the R side significantly limits activity tolerance.  Pt reports she can get fatigued with ADLs and IADLs.  FUNCTIONAL OUTCOME MEASURES: FOTO: 44; 48  UPPER EXTREMITY ROM:    Active ROM Right eval Left Eval WNL  Shoulder flexion 38 (60)   Shoulder abduction    Shoulder adduction 42 (51)   Shoulder extension    Shoulder internal rotation    Shoulder external rotation Hand to back of head (scaption and chin tuck)   Elbow flexion WNL   Elbow extension WNL   Wrist flexion WNL   Wrist extension WNL   Wrist ulnar deviation    Wrist radial deviation    Wrist pronation WNL   Wrist supination WNL   (Blank rows = not tested)  R hand: Able to oppose each digit to thumb with extra time and able to make full composite fist  UPPER EXTREMITY MMT:     MMT Right eval Left Eval 5/5  Shoulder flexion 3-   Shoulder abduction 3-   Shoulder adduction    Shoulder extension    Shoulder internal rotation 3+   Shoulder external rotation 3+   Middle trapezius    Lower trapezius    Elbow flexion 4-   Elbow extension 4   Wrist flexion 4-   Wrist extension 3+   Wrist ulnar deviation    Wrist radial deviation    Wrist pronation    Wrist supination    (Blank rows = not tested)  HAND FUNCTION: Grip strength: Right: 4 lbs; Left: 50 lbs, Lateral pinch: Right: 0 lbs, Left: 17 lbs, and 3 point pinch: Right: 0 lbs, Left: 19 lbs  COORDINATION: Finger Nose Finger test: able to touch nose with  increased time 9 Hole Peg test: Right: unable (picked up a peg with repeat trials but unable to move the peg from horizontal to vertical position to place into board) sec; Left: 24 sec  SENSATION: tingling in the R hand  Light touch: Impaired  Proprioception: WFL  EDEMA: N/A  MUSCLE TONE: RUE: Mild and Hypertonic  COGNITION: Overall cognitive status: Within functional limits for tasks assessed  VISION: Subjective report: Pt reports increased blurred vision following her stroke Baseline vision: Wears glasses all the time  VISION ASSESSMENT: Ocular ROM: WFL Gaze preference/alignment: gaze left; slight Tracking/Visual pursuits: Able to track stimulus in all quads without difficulty Saccades: WFL Visual Fields: no apparent deficits  PERCEPTION: WFL  PRAXIS: Impaired: Motor planning  OBSERVATIONS: Pt guards RUE with flexed elbow and shoulder IR/hand to abdomen during mobility.  R scapular elevation noting increased tension in R upper trap.  TODAY'S TREATMENT:                                                                                                                              Therapeutic Exercise: Performed passive R scapular depression and retraction, and reps of gentle passive stretching throughout the RUE, including all planes for R shoulder, elbow, forearm, and R wrist and digit flex/ext.  Instructed pt in self PROM for R shoulder flexion clasping hands and reaching overhead; good return demo.  Neuro re-ed: Pt picked up 1/2"-1" washers from dish and placed over vertical and diagonal dowels to target small item pick up and reaching toward a target.  Pt used the L hand to set up and place washers within R thumb and IF in prep for threading washer over dowel.    PATIENT EDUCATION: Education details: theraputty exercises Person educated: Patient Education method: Explanation and Verbal cues Education comprehension: verbalized understanding and needs further  education  HOME EXERCISE PROGRAM: Soft blue theraputty exercises  GOALS: Goals reviewed with patient? Yes  SHORT TERM GOALS: Target date: 02/26/23 (6 weeks)  Pt will be indep to perform HEP for increasing RUE flexibility, strength, and coordination to better engage the RUE into daily tasks. Baseline: Not yet initiated Goal status: INITIAL  LONG TERM GOALS: Target date: 04/09/23 (12 weeks)  Pt will increase FOTO score to 48 or better to indicate clinically relevant improvement in self perceived use of R arm for daily tasks.  Baseline: Eval: 44  Goal status: INITIAL  2.  Pt will increase R active shoulder flexion to 100* or better to achieve functional ROM for UB ADLs and reaching for ADL supplies. Baseline: Eval: R 38 degrees (passive 60); pt reaches during ADLs only with the L arm Goal status: INITIAL  3.  Pt will increase R grip strength by 10 or more lbs to enable use of R hand to hold and carry light ADL supplies. Baseline: Eval: R grip 4 lbs, L 50 lbs Goal status: INITIAL  4.  Pt will report <4/10 pain throughout the RUE to enable improved tolerance with using RUE during daily tasks.  Baseline: Eval: 6-7/10 pain throughout the R side (pt predominantly uses L hand for ADLs) Goal status: INITIAL  5.  Pt will increase R hand dexterity/FMC skills to manipulate clothing fasteners with bilat hands. Baseline: Eval: pt avoids clothing fasteners or manages with extra time using L hand only (R 9 hole: unable, L 24 sec) Goal status: INITIAL  ASSESSMENT:  CLINICAL IMPRESSION: Pt reports consistent attempts at using the R arm with daily tasks since last seen by OT.  Pt reports she's using her R arm more to help bathe the L arm, and did some cooking this weekend.  Pt is tolerating PROM better throughout the RUE with less pain and with greater end range for flexion, abd, and ER, but does still have occasional bursts of pain with activity when engaging the R arm into reaching  activities.  Pt  worked on Aeronautical engineer today using R hand and threading them onto a vertical and diagonal dowel.  Pt required use of L hand to set up washer in hand for a secure pinch as pt is not yet able to reposition items within her fingertips without dropping.  Pt will continue to benefit from skilled OT to reduce R shoulder stiffness, improve RUE strength and coordination, and reduce pain throughout the RUE in order to maximize functional use of the RUE with daily tasks.   PERFORMANCE DEFICITS: in functional skills including ADLs, IADLs, coordination, dexterity, sensation, tone, ROM, strength, pain, flexibility, Fine motor control, Gross motor control, mobility, balance, body mechanics, decreased knowledge of use of DME, vision, and UE functional use.  IMPAIRMENTS: are limiting patient from ADLs, IADLs, rest and sleep, work, and leisure.   CO-MORBIDITIES: has co-morbidities such as CHF, depression  that affects occupational performance. Patient will benefit from skilled OT to address above impairments and improve overall function.  MODIFICATION OR ASSISTANCE TO COMPLETE EVALUATION: No modification of tasks or assist necessary to complete an evaluation.  OT OCCUPATIONAL PROFILE AND HISTORY: Problem focused assessment: Including review of records relating to presenting problem.  CLINICAL DECISION MAKING: Moderate - several treatment options, min-mod task modification necessary  REHAB POTENTIAL: Good  EVALUATION COMPLEXITY: Moderate    PLAN:  OT FREQUENCY: 1-2x/week (pending any visit limitations with insurance), but would benefit from 2x/week  OT DURATION: 12 weeks  PLANNED INTERVENTIONS: self care/ADL training, therapeutic exercise, therapeutic activity, neuromuscular re-education, manual therapy, passive range of motion, balance training, moist heat, cryotherapy, patient/family education, coping strategies training, and DME and/or AE instructions  RECOMMENDED OTHER SERVICES: OT recommended pt  follow up with her eye doctor as she does report some increased blurred vision since her CVA  CONSULTED AND AGREED WITH PLAN OF CARE: Patient  PLAN FOR NEXT SESSION: see above  Leta Speller, MS, OTR/L   Darleene Cleaver, OT 02/06/2023, 1:20 PM

## 2023-02-10 ENCOUNTER — Ambulatory Visit: Payer: Medicaid Other | Admitting: Physical Therapy

## 2023-02-10 DIAGNOSIS — R262 Difficulty in walking, not elsewhere classified: Secondary | ICD-10-CM

## 2023-02-10 DIAGNOSIS — R278 Other lack of coordination: Secondary | ICD-10-CM | POA: Diagnosis not present

## 2023-02-10 DIAGNOSIS — Z7689 Persons encountering health services in other specified circumstances: Secondary | ICD-10-CM | POA: Diagnosis not present

## 2023-02-10 DIAGNOSIS — R2681 Unsteadiness on feet: Secondary | ICD-10-CM

## 2023-02-10 DIAGNOSIS — R2689 Other abnormalities of gait and mobility: Secondary | ICD-10-CM | POA: Diagnosis not present

## 2023-02-10 DIAGNOSIS — M6281 Muscle weakness (generalized): Secondary | ICD-10-CM | POA: Diagnosis not present

## 2023-02-10 NOTE — Therapy (Signed)
OUTPATIENT PHYSICAL THERAPY TREATMENT    Patient Name: Anna Tapia MRN: [id number] DOB:Mar 28, 1991, 32 y.o., female Today's Date: 02/10/2023   PCP: Margarita Mail, DO  REFERRING PROVIDER: Margarita Mail, DO   END OF SESSION:   PT End of Session - 02/10/23 1527     Visit Number 13    Number of Visits 24    Date for PT Re-Evaluation 03/20/23    Authorization Type [place] MEDICA[employer]PAID [employer] PLAN    Authorization Time Period 12/26/22-03/20/23    Authorization - Number of Visits 19    Progress Note Due on Visit 20    PT Start Time 1520    PT Stop Time 1605    PT Time Calculation (min) 45 min    Equipment Utilized During Treatment Gait belt    Activity Tolerance Patient tolerated treatment well;No increased pain    Behavior During Therapy WFL for tasks assessed/performed                Past Medical History:  Diagnosis Date   Asthma    Brain aneurysm    CHF (congestive heart failure) ([facility])    Stroke [facility])    Past Surgical History:  Procedure Laterality Date   VENTRICULOPERITONEAL SHUNT     Patient Active Problem List   Diagnosis Date Noted   Major depressive disorder, recurrent episode, moderate 02/07/2022   Sepsis secondary to UTI 05/01/2020   VP (ventriculoperitoneal) shunt status 05/01/2020   Right ovarian cyst 05/01/2020   Elevated LFTs 05/01/2020   Vaginal spotting 05/01/2020    ONSET DATE: 07/24/2022  REFERRING DIAG: I69.30 (ICD-10-CM) - History of CVA with residual deficit  THERAPY DIAG:  Muscle weakness (generalized)  Other lack of coordination  Unsteadiness on feet  Difficulty in walking, not elsewhere classified  Balance disorder  Other abnormalities of gait and mobility  Rationale for Evaluation and Treatment: Rehabilitation  SUBJECTIVE:                                                                                                                                                                                               SUBJECTIVE STATEMENT:  Pt reports that she is doing Okay. No falls. Pt is looking forward to cooking dinner tonight. Pt would like to decrease visits to once per week starting next week. Depending on insurance approval.  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  PAIN:  Are you having pain? Yes see above  RUE   PRECAUTIONS: None  WEIGHT BEARING RESTRICTIONS: No  FALLS: Has patient fallen in last 6 months? Yes. Number of falls 1, during the stroke and fell in the shower.  PATIENT GOALS: Get more mobility in the arm and less pain in the leg.  OBJECTIVE:   OBJECTIVE:    DIAGNOSTIC FINDINGS:      CLINICAL DATA:  Initial evaluation for intracranial hemorrhage.   EXAM: CT ANGIOGRAPHY HEAD AND NECK    IMPRESSION: 1. 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 Again, prominent arterial contribution to the AVM is seen from the left ACA and posterior circulation, with primary venous drainage into the deep venous system. Associated 7 mm aneurysm along the right anterolateral aspect of the AVM as above. 2. Diffuse tortuosity and ectasia elsewhere about the major arterial vasculature of the head and neck. No large vessel occlusion or hemodynamically significant stenosis. No other acute vascular abnormality.   COGNITION: Overall cognitive status: Within functional limits for tasks assessed             SENSATION: WFL   COORDINATION: Pt  limited with quick movements during supination/pronation of the wrist on the R side and also with thumb to finger movements.   POSTURE: rounded shoulders   LOWER EXTREMITY ROM:      Active  Right Eval Left Eval  Hip flexion      Hip extension      Hip abduction      Hip adduction      Hip internal rotation      Hip external rotation      Knee flexion      Knee extension      Ankle dorsiflexion      Ankle plantarflexion      Ankle inversion      Ankle eversion       (Blank rows = not tested)   LOWER EXTREMITY MMT:     MMT Right Eval Left Eval  Hip flexion 4 4+  Hip abduction 4 4+  Hip adduction 4- 4-  Knee flexion 4- 4+  Knee extension 4 4+  Ankle dorsiflexion 4 4+  (Blank rows = not tested)     TRANSFERS: Assistive device utilized: None  Sit to stand: Complete Independence Stand to sit: Complete Independence Chair to chair: Complete Independence   GAIT: Gait pattern: step through pattern, decreased arm swing- Right, decreased step length- Left, decreased stance time- Right, decreased stride length, decreased hip/knee flexion- Right, decreased ankle dorsiflexion- Right, and circumduction- Right Distance walked: 40' Assistive device utilized: None Level of assistance: Complete Independence Comments: Pt able to ambulate safely without AD.   FUNCTIONAL TESTS:  5 times sit to stand: 12/26/22 15.44 sec. 01/30/23: 11.51 sec  Timed up and go (TUG): 15.65 sec 01/30/23: 10.99 sec (average of 2 trials)   6 minute walk test: 619' 2 standing rest breaks 01/30/23 924' no AD and no rest breaks  10 meter walk test: 15.46 sec 01/30/23: normal:13.06sec(.40m/s) and fast: 11.7(.2m/s) Functional gait assessment: 15/30. 01/30/23:  18/30   PATIENT SURVEYS:  FOTO 48/58/ 01/30/23: 51     TODAY'S TREATMENT:  DATE: 02/10/23  Seated NMR:   Nustep BLE/BUE  reciprocal movement training level 3 x 5 min with cues for attention to the RUE   In parallel bars.  Stepping over half bolster x 10 with 1 LE with each LE then performed x with BLE then x 2 bouts.  Stepping up/down 6 inch step forward x 8 Bil and backward x 8. Each performed leading with BLE.  Throughout step management training, pt performed with BUE support progressing to RUE support then no UE for 2-3 reps on each bout except posterior step with RLE.   Gait training with 4# ankle weights x 182ft with cues for hip height and ankle eversion with heel contact     PATIENT EDUCATION: Education details: pt educated throughout session about proper posture and technique with exercises. Improved exercise technique, movement at target joints, use of target muscles after min to mod verbal, visual, tactile cues.  Person educated: Patient Education method: Explanation Education comprehension: verbalized understanding  HOME EXERCISE PROGRAM:   Access Code: [email or web address] URL: [email or web address] Date: 01/07/2023 Prepared by: Grier Rocher  Exercises - Staggered Sit-to-Stand with BUE push from RLE - 1 x daily - 7 x weekly - 3 sets - 5 reps  Access Code: [email or web address] URL: [email or web address] Date: 01/22/2023 Prepared by: Grier Rocher  Exercises - Seated Figure 4 Piriformis Stretch  - 1 x daily - 7 x weekly - 2 sets - 3 reps - 60 sec hold   PT instructed pt in HEP  Lateral lunge. X 5 bil and UE supported on rail Standing hip flexor stretch x 20 sec bil  Piriformis stretch x 1 min with increasing over pressure with trunk flexion Sit<>stand pushing from RLE x 10 .  Hip abduction x 10 BLE Hip extension x 10 BLE  Cues for posture and proper set up to reduced compensation on the RLE through trunk   GOALS: Goals reviewed with patient? Yes  SHORT TERM GOALS: Target date: 01/23/2023  Pt will be independent with HEP in order to demonstrate increased ability to  perform tasks related to occupation/hobbies. Baseline: Pt not given HEP at initial evaluation. Goal status: MET   LONG TERM GOALS: Target date: 03/20/2023  1.  Patient (> 110 years old) will complete five times sit to stand test in < 15 seconds indicating an increased LE strength and improved balance. Baseline: 15.44 sec. 3/28 11.51 sec  Goal status: MET   2.  Patient will increase FOTO score to equal to or greater than 58 to demonstrate statistically significant improvement in mobility and quality of life.  Baseline: 48 01/30/23: 51  Goal status: Progressing    3.  Patient will increase FGA score by > 4 points to demonstrate decreased fall risk during functional activities. Baseline: 15/30. 01/30/23:  18/30 Goal status: Progressing    4.  Patient will reduce timed up and go to <11 seconds to reduce fall risk and demonstrate improved transfer/gait ability. Baseline: 15.65 sec. 01/30/23: 10.99 sec  Goal status:  MET   5.  Patient will increase 10 meter walk test to >1.32m/s as to improve gait speed for better community ambulation and to reduce fall risk. Baseline: 15.46 sec. 01/30/23: normal:13.06sec(.74m/s) Goal status: progressing   6.  Patient will increase six minute walk test distance to >1000 for progression to community ambulator and improve gait ability Baseline: 85' with 2 standing rest breaks, no AD. 01/30/23 924' no AD and no rest breaks  Goal status: progressing     ASSESSMENT:  CLINICAL IMPRESSION:  Pt continues to put forth good effort in PT sessions. Pt requires encouragement to attempt novel tasks, but able to complete with min assist from PT. Noted improvement in RLE strength allows improved independence with difficult tasks. Pt will benefit from continued skilled PT interventions to address balance, strength, and coordination deficits to improve QoL and improve function and safety with IADLs.     OBJECTIVE IMPAIRMENTS: Abnormal gait, decreased activity tolerance,  decreased balance, decreased endurance, decreased mobility, difficulty walking, decreased ROM, decreased strength, and pain.   ACTIVITY LIMITATIONS: carrying, lifting, bending, standing, squatting, sleeping, bathing, toileting, reach over head, hygiene/grooming, and locomotion level  PARTICIPATION LIMITATIONS: meal prep, cleaning, laundry, driving, shopping, community activity, and yard work  PERSONAL FACTORS: Age, Past/current experiences, Social background, Time since onset of injury/illness/exacerbation, Transportation, and 3+ comorbidities: asthma, depression, CHF  are also affecting patient's functional outcome.   REHAB POTENTIAL: Good  CLINICAL DECISION MAKING: Stable/uncomplicated  EVALUATION COMPLEXITY: Moderate  PLAN:  PT FREQUENCY: 1-2x/week, preferably 2x/week but due to insurance, may only be able to be seen 1x/week  PT DURATION: 12 weeks  PLANNED INTERVENTIONS: Therapeutic exercises, Therapeutic activity, Neuromuscular re-education, Balance training, Gait training, Patient/Family education, Self Care, Joint mobilization, Joint manipulation, Stair training, Vestibular training, Canalith repositioning, Orthotic/Fit training, DME instructions, Aquatic Therapy, Dry Needling, Electrical stimulation, Spinal manipulation, Spinal mobilization, Cryotherapy, Moist heat, Taping, Manual therapy, and Re-evaluation   PLAN FOR NEXT SESSION:   High intensity gait and balance training. Endurance training.   Grier Rocher PT, DPT  Physical Therapist - [place]  [facility]  4:57 PM 02/10/23

## 2023-02-12 ENCOUNTER — Ambulatory Visit: Payer: Medicaid Other | Admitting: Physical Therapy

## 2023-02-12 ENCOUNTER — Telehealth: Payer: Self-pay

## 2023-02-12 ENCOUNTER — Ambulatory Visit: Payer: Medicaid Other

## 2023-02-12 DIAGNOSIS — R2681 Unsteadiness on feet: Secondary | ICD-10-CM

## 2023-02-12 DIAGNOSIS — M6281 Muscle weakness (generalized): Secondary | ICD-10-CM

## 2023-02-12 DIAGNOSIS — R2689 Other abnormalities of gait and mobility: Secondary | ICD-10-CM | POA: Diagnosis not present

## 2023-02-12 DIAGNOSIS — R278 Other lack of coordination: Secondary | ICD-10-CM

## 2023-02-12 DIAGNOSIS — Z7689 Persons encountering health services in other specified circumstances: Secondary | ICD-10-CM | POA: Diagnosis not present

## 2023-02-12 DIAGNOSIS — R262 Difficulty in walking, not elsewhere classified: Secondary | ICD-10-CM | POA: Diagnosis not present

## 2023-02-12 NOTE — Telephone Encounter (Unsigned)
Copied from CRM [id number]. Topic: General - Other >> Feb 12, 2023  9:36 AM Macon Large wrote: Reason for CRM: Pt stated her case worker is requesting that she provide a letter from her pcp stating pt is unable to work due to having a stroke. Pt requests call back to advise. Cb# [phone]

## 2023-02-12 NOTE — Therapy (Signed)
OUTPATIENT PHYSICAL THERAPY TREATMENT    Patient Name: Anna Tapia MRN: [id number] DOB:January 03, 1991, 32 y.o., female Today's Date: 02/12/2023   PCP: Margarita Mail, DO  REFERRING PROVIDER: Margarita Mail, DO   END OF SESSION:   PT End of Session - 02/12/23 1523     Visit Number 14    Number of Visits 24    Date for PT Re-Evaluation 03/20/23    Authorization Type [employer]    Authorization Time Period 12/26/22-03/20/23    Authorization - Number of Visits 19    Progress Note Due on Visit 20    Equipment Utilized During Treatment Gait belt    Activity Tolerance Patient tolerated treatment well;No increased pain    Behavior During Therapy WFL for tasks assessed/performed                Past Medical History:  Diagnosis Date   Asthma    Brain aneurysm    CHF (congestive heart failure) (HCC)    Stroke [facility])    Past Surgical History:  Procedure Laterality Date   VENTRICULOPERITONEAL SHUNT     Patient Active Problem List   Diagnosis Date Noted   Major depressive disorder, recurrent episode, moderate 02/07/2022   Sepsis secondary to UTI 05/01/2020   VP (ventriculoperitoneal) shunt status 05/01/2020   Right ovarian cyst 05/01/2020   Elevated LFTs 05/01/2020   Vaginal spotting 05/01/2020    ONSET DATE: 07/24/2022  REFERRING DIAG: I69.30 (ICD-10-CM) - History of CVA with residual deficit  THERAPY DIAG:  Muscle weakness (generalized)  Other lack of coordination  Unsteadiness on feet  Difficulty in walking, not elsewhere classified  Balance disorder  Rationale for Evaluation and Treatment: Rehabilitation  SUBJECTIVE:                                                                                                                                                                                              SUBJECTIVE STATEMENT:  Pt reports that she is having a rough day. Reports more pain in the R shoulder and L hip  7-8/10.  Pt states that pain medicine is not helping much with management of nerve pain .   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  PAIN:  Are you having pain? Yes see above  RUE   PRECAUTIONS: None  WEIGHT BEARING RESTRICTIONS: No  FALLS: Has patient fallen in last 6 months? Yes. Number of falls 1, during the stroke and fell in the shower.  PATIENT GOALS: Get more mobility in the arm and less pain in the leg.  OBJECTIVE:   OBJECTIVE:    DIAGNOSTIC FINDINGS:      CLINICAL DATA:  Initial evaluation for intracranial hemorrhage.   EXAM: CT ANGIOGRAPHY HEAD AND NECK    IMPRESSION: 1. 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 Again, prominent arterial contribution to the AVM is seen from the left ACA and posterior circulation, with primary venous drainage into the deep venous system. Associated 7 mm aneurysm along the right anterolateral aspect of the AVM as above. 2. Diffuse tortuosity and ectasia elsewhere about the major arterial vasculature of the head and neck. No large vessel occlusion or hemodynamically significant stenosis. No other acute vascular abnormality.   COGNITION: Overall cognitive status: Within functional limits for tasks assessed             SENSATION: WFL   COORDINATION: Pt limited with quick movements during supination/pronation of the wrist on the R side and also with thumb to finger movements.    POSTURE: rounded shoulders   LOWER EXTREMITY ROM:      Active  Right Eval Left Eval  Hip flexion      Hip extension      Hip abduction      Hip adduction      Hip internal rotation      Hip external rotation      Knee flexion      Knee extension      Ankle dorsiflexion      Ankle plantarflexion      Ankle inversion      Ankle eversion       (Blank rows = not tested)   LOWER EXTREMITY MMT:     MMT Right Eval Left Eval  Hip flexion 4 4+  Hip abduction 4 4+  Hip adduction 4- 4-  Knee flexion 4- 4+  Knee extension 4 4+  Ankle dorsiflexion 4 4+  (Blank rows = not tested)     TRANSFERS: Assistive device utilized: None  Sit to stand: Complete Independence Stand to sit: Complete Independence Chair to chair: Complete Independence   GAIT: Gait pattern: step through pattern, decreased arm swing- Right, decreased step length- Left, decreased stance time- Right, decreased stride length, decreased hip/knee flexion- Right, decreased ankle dorsiflexion- Right, and circumduction- Right Distance walked: 40' Assistive device utilized: None Level of assistance: Complete Independence Comments: Pt able to ambulate safely without AD.   FUNCTIONAL TESTS:  5 times sit to stand: 12/26/22 15.44 sec. 01/30/23: 11.51 sec  Timed up and go (TUG): 15.65 sec 01/30/23: 10.99 sec (average of 2 trials)   6 minute walk test: 619' 2 standing rest breaks 01/30/23 924' no AD and no rest breaks  10 meter walk test: 15.46 sec 01/30/23: normal:13.06sec(.64m/s) and fast: 11.7(.75m/s) Functional gait assessment: 15/30. 01/30/23:  18/30   PATIENT SURVEYS:  FOTO 48/58/ 01/30/23: 51     TODAY'S TREATMENT:  DATE: 02/12/23  Variable gait training without AD  to facilitate improved neuromotor recruitments and normalized movement patterns  Through hall and rehab gym x 14750ft.   Forward/reverse 7530ft each x 3 Side stepping R and L 1125ft, x3 each  Weave through 10 cones x 10.  Forward 2, back 1 x 5 cones with side step around the second cone. Performed x 2.   Leg press 45# x 12 Supine NMR: Heel slide x 5 bil  Hip abduction x 5 bil Bridge x 6 with 3 sec hold.  Cues for WB through the RLE to aid in desensitization of R foot prior to getting out of bed.   PATIENT EDUCATION: Education details: pt educated throughout session about proper posture and technique with exercises. Improved exercise technique, movement at target joints, use of target muscles after min to mod verbal, visual, tactile cues.  Person educated: Patient Education method: Explanation Education comprehension: verbalized understanding  HOME EXERCISE PROGRAM:   Access Code: [email or web address] URL: [email or web address] Date: 01/07/2023 Prepared by: Grier RocherAustin Eddison Searls  Exercises - Staggered Sit-to-Stand with BUE push from RLE - 1 x daily - 7 x weekly - 3 sets - 5 reps  Access Code: [email or web address] URL: [email or web address] Date: 01/22/2023 Prepared by: Grier RocherAustin Charon Smedberg  Exercises - Seated Figure 4 Piriformis Stretch  - 1 x daily - 7 x weekly - 2 sets - 3 reps - 60 sec hold   PT instructed pt in HEP  Lateral lunge. X 5 bil and UE supported on rail Standing hip flexor stretch x 20 sec bil  Piriformis stretch x 1 min with increasing over pressure with trunk flexion Sit<>stand pushing from RLE x 10 .  Hip abduction x 10 BLE Hip extension x 10 BLE  Cues for posture and proper set up to reduced compensation on the RLE through trunk   GOALS: Goals reviewed with patient? Yes  SHORT TERM GOALS: Target date: 01/23/2023  Pt will be independent with HEP in order to demonstrate increased ability to perform tasks related to occupation/hobbies. Baseline: Pt not given HEP at initial evaluation. Goal status: MET   LONG TERM GOALS: Target date: 03/20/2023  1.  Patient (> 33 years old) will  complete five times sit to stand test in < 15 seconds indicating an increased LE strength and improved balance. Baseline: 15.44 sec. 3/28 11.51 sec  Goal status: MET   2.  Patient will increase FOTO score to equal to or greater than 58 to demonstrate statistically significant improvement in mobility and quality of life.  Baseline: 48 01/30/23: 51  Goal status: Progressing    3.  Patient will increase FGA score by > 4 points to demonstrate decreased fall risk during functional activities. Baseline: 15/30. 01/30/23:  18/30 Goal status: Progressing    4.  Patient will reduce timed up and go to <11 seconds to reduce fall risk and demonstrate improved transfer/gait ability. Baseline: 15.65 sec. 01/30/23: 10.99 sec  Goal status:  MET   5.  Patient will increase 10 meter walk test to >1.4563m/s as to improve gait speed for better community ambulation and to reduce fall risk. Baseline: 15.46 sec. 01/30/23: normal:13.06sec(.3522m/s) Goal status: progressing   6.  Patient will increase six minute walk test distance to >1000 for progression to community ambulator and improve gait ability Baseline: 73619' with 2 standing rest breaks, no AD. 01/30/23 924' no AD and no rest breaks  Goal status: progressing     ASSESSMENT:  CLINICAL IMPRESSION:  Pt continues to put forth good  effort in PT sessions. But noted to have decreased initiation on this day. Pt reports pain in the R hip and R shoulder have been very limiting today, but functionally, pt has been using the RUE for ADL and iADLs per her report much better over the last several weeks. Pt demonstrating mild improvement in hip position with gait following intervention, but only sustained when instruction given by PT. Pt will benefit from continued skilled PT interventions to address balance, strength, and coordination deficits to improve QoL and improve function and safety with IADLs.     OBJECTIVE IMPAIRMENTS: Abnormal gait, decreased activity tolerance,  decreased balance, decreased endurance, decreased mobility, difficulty walking, decreased ROM, decreased strength, and pain.   ACTIVITY LIMITATIONS: carrying, lifting, bending, standing, squatting, sleeping, bathing, toileting, reach over head, hygiene/grooming, and locomotion level  PARTICIPATION LIMITATIONS: meal prep, cleaning, laundry, driving, shopping, community activity, and yard work  PERSONAL FACTORS: Age, Past/current experiences, Social background, Time since onset of injury/illness/exacerbation, Transportation, and 3+ comorbidities: asthma, depression, CHF  are also affecting patient's functional outcome.   REHAB POTENTIAL: Good  CLINICAL DECISION MAKING: Stable/uncomplicated  EVALUATION COMPLEXITY: Moderate  PLAN:  PT FREQUENCY: 1-2x/week, preferably 2x/week but due to insurance, may only be able to be seen 1x/week  PT DURATION: 12 weeks  PLANNED INTERVENTIONS: Therapeutic exercises, Therapeutic activity, Neuromuscular re-education, Balance training, Gait training, Patient/Family education, Self Care, Joint mobilization, Joint manipulation, Stair training, Vestibular training, Canalith repositioning, Orthotic/Fit training, DME instructions, Aquatic Therapy, Dry Needling, Electrical stimulation, Spinal manipulation, Spinal mobilization, Cryotherapy, Moist heat, Taping, Manual therapy, and Re-evaluation   PLAN FOR NEXT SESSION:   High intensity gait and balance training. Endurance training.   Grier Rocher PT, DPT  Physical Therapist - [place]  [facility]  3:24 PM 02/12/23

## 2023-02-13 NOTE — Telephone Encounter (Signed)
Pt already have appt sch'd for 4.22.2024. she would like something sooner however we are booked. Therefore she will keep the appt she already has scheduled.

## 2023-02-16 NOTE — Therapy (Signed)
OUTPATIENT OCCUPATIONAL THERAPY NEURO TREATMENT NOTE  Patient Name: Anna Tapia MRN: [id number] DOB:1991-10-22, 32 y.o., female Today's Date: 02/16/2023  PCP: Dr. Margarita Mail REFERRING PROVIDER: Dr. Margarita Mail  END OF SESSION:  OT End of Session - 02/16/23 1447     Visit Number 4    Number of Visits 24    Date for OT Re-Evaluation 04/09/23    Progress Note Due on Visit 10    OT Start Time 1600    OT Stop Time 1645    OT Time Calculation (min) 45 min    Activity Tolerance Patient tolerated treatment well    Behavior During Therapy [facility] for tasks assessed/performed             Past Medical History:  Diagnosis Date   Asthma    Brain aneurysm    CHF (congestive heart failure) (HCC)    Stroke [facility])    Past Surgical History:  Procedure Laterality Date   VENTRICULOPERITONEAL SHUNT     Patient Active Problem List   Diagnosis Date Noted   Major depressive disorder, recurrent episode, moderate 02/07/2022   Sepsis secondary to UTI 05/01/2020   VP (ventriculoperitoneal) shunt status 05/01/2020   Right ovarian cyst 05/01/2020   Elevated LFTs 05/01/2020   Vaginal spotting 05/01/2020    ONSET DATE: Sept 2023;   REFERRING DIAG:  I63.9 (ICD-10-CM) - CVA (cerebral vascular accident) (HCC)   THERAPY DIAG:  Muscle weakness (generalized)  Other lack of coordination  Rationale for Evaluation and Treatment: Rehabilitation  SUBJECTIVE:  SUBJECTIVE STATEMENT: Pt reports she's determined to "get over this" and not have to rely on others.   Pt accompanied by: self  PERTINENT HISTORY: Per Dr. Caralee Ates on 11/19/22: HPI Anna Tapia presents to establish care.  She is here with her mom.   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       PRECAUTIONS: None  WEIGHT BEARING RESTRICTIONS: No  PAIN:  Are you having pain? Yes: NPRS scale: 7-8/10 Pain location: Entire R side, arm and leg Pain description: pins and needles in the hand, arm is achy, moving the arm causes stabbing pain Aggravating factors: movement of the arm Relieving factors: rest; pt reports medication is minimally effective  FALLS: Has patient fallen in last 6 months? Yes. 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  Being independent."  OBJECTIVE:   HAND DOMINANCE: Left  ADLs: Overall ADLs: mostly performed with the LUE Transfers/ambulation related to ADLs: indep/extra time Eating: cuts food 1 handed Grooming: 1 handed (L) UB Dressing: occasional help to don clothes if tired after  a shower; help with straightening clothes; struggles with clothing fasteners LB Dressing: wearing slip on shoes; difficulty with clothing fasteners  Toileting: indep Bathing: modified indep; pt makes attempts to engage the R arm while bathing but reports this is difficult Tub Shower transfers: modified indep Equipment:  see above  IADLs: Shopping: pt can go shopping accompanied by a family member Light housekeeping: 1 handed Meal Prep: cooking causes fatigue; mostly L handed Community mobility: uses rollator for community  mobility; uses [employer] transportation Medication management: uses weekly pill organizer; Engineer, materials: mother manages (pt states she doesn't really have any bills)  Handwriting:  N/A (pt is L hand dominant)  MOBILITY STATUS:  ambulatory without AD; pt seeing PT for LE strengthening and balance deficits  POSTURE COMMENTS:  rounded shoulders Sitting balance: Moves/returns truncal midpoint >2 inches in all planes  ACTIVITY TOLERANCE: Activity tolerance: Pain in the R side significantly limits activity tolerance.  Pt reports she can get fatigued with ADLs and IADLs.  FUNCTIONAL OUTCOME MEASURES: FOTO: 44; 48  UPPER EXTREMITY ROM:    Active ROM Right eval Left Eval WNL  Shoulder flexion 38 (60)   Shoulder abduction    Shoulder adduction 42 (51)   Shoulder extension    Shoulder internal rotation    Shoulder external rotation Hand to back of head (scaption and chin tuck)   Elbow flexion WNL   Elbow extension WNL   Wrist flexion WNL   Wrist extension WNL   Wrist ulnar deviation    Wrist radial deviation    Wrist pronation WNL   Wrist supination WNL   (Blank rows = not tested)  R hand: Able to oppose each digit to thumb with extra time and able to make full composite fist  UPPER EXTREMITY MMT:     MMT Right eval Left Eval 5/5  Shoulder flexion 3-   Shoulder abduction 3-   Shoulder adduction    Shoulder extension    Shoulder internal rotation 3+   Shoulder external rotation 3+   Middle trapezius    Lower trapezius    Elbow flexion 4-   Elbow extension 4   Wrist flexion 4-   Wrist extension 3+   Wrist ulnar deviation    Wrist radial deviation    Wrist pronation    Wrist supination    (Blank rows = not tested)  HAND FUNCTION: Grip strength: Right: 4 lbs; Left: 50 lbs, Lateral pinch: Right: 0 lbs, Left: 17 lbs, and 3 point pinch: Right: 0 lbs, Left: 19 lbs  COORDINATION: Finger Nose Finger test: able to touch nose with increased time 9 Hole Peg  test: Right: unable (picked up a peg with repeat trials but unable to move the peg from horizontal to vertical position to place into board) sec; Left: 24 sec  SENSATION: tingling in the R hand  Light touch: Impaired  Proprioception: WFL  EDEMA: N/A  MUSCLE TONE: RUE: Mild and Hypertonic  COGNITION: Overall cognitive status: Within functional limits for tasks assessed  VISION: Subjective report: Pt reports increased blurred vision following her stroke Baseline vision: Wears glasses all the time  VISION ASSESSMENT: Ocular ROM: WFL Gaze preference/alignment: gaze left; slight Tracking/Visual pursuits: Able to track stimulus in all quads without difficulty Saccades: WFL Visual Fields: no apparent deficits  PERCEPTION: WFL  PRAXIS: Impaired: Motor planning  OBSERVATIONS: Pt guards RUE with flexed elbow and shoulder IR/hand to abdomen during mobility.  R scapular elevation noting increased tension in R upper trap.  TODAY'S TREATMENT:  Therapeutic Exercise: Performed passive R scapular depression and retraction, and reps of gentle passive stretching throughout the RUE, including all planes for R shoulder, elbow, forearm, and R wrist and digit flex/ext.  Reviewed self PROM for R shoulder flexion clasping hands and reaching overhead; good return demo.  Facilitated hand strengthening with use of hand gripper set at 6.6# to remove jumbo pegs from pegboard x3 trials using R hand.  Extra time needed for set up of pegs in board using R hand.    Neuro re-ed: Pt picked up 1/2"-1" washers from dish and placed over vertical and diagonal dowels to target small item pick up and reaching toward a target.  Pt used the L hand to set up and place washers within R thumb and IF in prep for threading washer over dowel.    PATIENT EDUCATION: Education details: self PROM for R  shoulder flexion Person educated: Patient Education method: Explanation and Verbal cues, demo Education comprehension: able to return demo.   HOME EXERCISE PROGRAM: Soft blue theraputty exercises  GOALS: Goals reviewed with patient? Yes  SHORT TERM GOALS: Target date: 02/26/23 (6 weeks)  Pt will be indep to perform HEP for increasing RUE flexibility, strength, and coordination to better engage the RUE into daily tasks. Baseline: Not yet initiated Goal status: INITIAL  LONG TERM GOALS: Target date: 04/09/23 (12 weeks)  Pt will increase FOTO score to 48 or better to indicate clinically relevant improvement in self perceived use of R arm for daily tasks.  Baseline: Eval: 44  Goal status: INITIAL  2.  Pt will increase R active shoulder flexion to 100* or better to achieve functional ROM for UB ADLs and reaching for ADL supplies. Baseline: Eval: R 38 degrees (passive 60); pt reaches during ADLs only with the L arm Goal status: INITIAL  3.  Pt will increase R grip strength by 10 or more lbs to enable use of R hand to hold and carry light ADL supplies. Baseline: Eval: R grip 4 lbs, L 50 lbs Goal status: INITIAL  4.  Pt will report <4/10 pain throughout the RUE to enable improved tolerance with using RUE during daily tasks.  Baseline: Eval: 6-7/10 pain throughout the R side (pt predominantly uses L hand for ADLs) Goal status: INITIAL  5.  Pt will increase R hand dexterity/FMC skills to manipulate clothing fasteners with bilat hands. Baseline: Eval: pt avoids clothing fasteners or manages with extra time using L hand only (R 9 hole: unable, L 24 sec) Goal status: INITIAL  ASSESSMENT:  CLINICAL IMPRESSION: Pt reporting 7-8/10 pain today throughout the RUE, but tolerated passive stretching well with moist heat applied simultaneously to R shoulder.  Pt reports she continues to work to engage the RUE into daily tasks, and stated that she was able to do some more cooking yesterday and made  some chicken.  Pt is demonstrating increase range at the R shoulder with flexion and abd.  Pt tolerated hand gripper today at 6.6 # with frequent rest breaks to complete 3 full sets to remove jumbo pegs from pegboard.  Pt continues to present with [facility] deficits and requires non-skid surface to pick up smaller items from table top.  R hand fingers tend to slip from a 3 point and 2 point pinch pattern, often requiring L hand to assist with set up of items in R hand.  Pt will continue to benefit from skilled OT to reduce R shoulder stiffness, improve RUE strength and coordination, and reduce pain throughout  the RUE in order to maximize functional use of the RUE with daily tasks.   PERFORMANCE DEFICITS: in functional skills including ADLs, IADLs, coordination, dexterity, sensation, tone, ROM, strength, pain, flexibility, Fine motor control, Gross motor control, mobility, balance, body mechanics, decreased knowledge of use of DME, vision, and UE functional use.  IMPAIRMENTS: are limiting patient from ADLs, IADLs, rest and sleep, work, and leisure.   CO-MORBIDITIES: has co-morbidities such as CHF, depression  that affects occupational performance. Patient will benefit from skilled OT to address above impairments and improve overall function.  MODIFICATION OR ASSISTANCE TO COMPLETE EVALUATION: No modification of tasks or assist necessary to complete an evaluation.  OT OCCUPATIONAL PROFILE AND HISTORY: Problem focused assessment: Including review of records relating to presenting problem.  CLINICAL DECISION MAKING: Moderate - several treatment options, min-mod task modification necessary  REHAB POTENTIAL: Good  EVALUATION COMPLEXITY: Moderate    PLAN:  OT FREQUENCY: 1-2x/week (pending any visit limitations with insurance), but would benefit from 2x/week  OT DURATION: 12 weeks  PLANNED INTERVENTIONS: self care/ADL training, therapeutic exercise, therapeutic activity, neuromuscular re-education,  manual therapy, passive range of motion, balance training, moist heat, cryotherapy, patient/family education, coping strategies training, and DME and/or AE instructions  RECOMMENDED OTHER SERVICES: OT recommended pt follow up with her eye doctor as she does report some increased blurred vision since her CVA  CONSULTED AND AGREED WITH PLAN OF CARE: Patient  PLAN FOR NEXT SESSION: see above  Danelle Earthly, MS, OTR/L   Otis Dials, OT 02/16/2023, 2:50 PM

## 2023-02-18 ENCOUNTER — Ambulatory Visit: Payer: Medicaid Other | Admitting: Physical Therapy

## 2023-02-18 ENCOUNTER — Ambulatory Visit: Payer: Medicaid Other

## 2023-02-18 DIAGNOSIS — R278 Other lack of coordination: Secondary | ICD-10-CM

## 2023-02-18 DIAGNOSIS — Z7689 Persons encountering health services in other specified circumstances: Secondary | ICD-10-CM | POA: Diagnosis not present

## 2023-02-18 DIAGNOSIS — M6281 Muscle weakness (generalized): Secondary | ICD-10-CM | POA: Diagnosis not present

## 2023-02-18 DIAGNOSIS — R2689 Other abnormalities of gait and mobility: Secondary | ICD-10-CM

## 2023-02-18 DIAGNOSIS — R262 Difficulty in walking, not elsewhere classified: Secondary | ICD-10-CM | POA: Diagnosis not present

## 2023-02-18 DIAGNOSIS — R2681 Unsteadiness on feet: Secondary | ICD-10-CM | POA: Diagnosis not present

## 2023-02-18 NOTE — Therapy (Signed)
OUTPATIENT PHYSICAL THERAPY TREATMENT    Patient Name: Anna Tapia MRN: [id number] DOB:24-Mar-1991, 32 y.o., female Today's Date: 02/18/2023   PCP: Margarita Mail, DO  REFERRING PROVIDER: Margarita Mail, DO   END OF SESSION:   PT End of Session - 02/18/23 1522     Visit Number 15    Number of Visits 24    Date for PT Re-Evaluation 03/20/23    Authorization Type [place] MEDICA[employer]PAID [employer] PLAN    Authorization Time Period 12/26/22-03/20/23    Authorization - Number of Visits 19    Progress Note Due on Visit 20    PT Start Time 1520    PT Stop Time 1600    PT Time Calculation (min) 40 min    Equipment Utilized During Treatment Gait belt    Activity Tolerance Patient tolerated treatment well;No increased pain    Behavior During Therapy WFL for tasks assessed/performed                Past Medical History:  Diagnosis Date   Asthma    Brain aneurysm    CHF (congestive heart failure) (HCC)    Stroke [facility])    Past Surgical History:  Procedure Laterality Date   VENTRICULOPERITONEAL SHUNT     Patient Active Problem List   Diagnosis Date Noted   Major depressive disorder, recurrent episode, moderate 02/07/2022   Sepsis secondary to UTI 05/01/2020   VP (ventriculoperitoneal) shunt status 05/01/2020   Right ovarian cyst 05/01/2020   Elevated LFTs 05/01/2020   Vaginal spotting 05/01/2020    ONSET DATE: 07/24/2022  REFERRING DIAG: I69.30 (ICD-10-CM) - History of CVA with residual deficit  THERAPY DIAG:  Muscle weakness (generalized)  Other lack of coordination  Unsteadiness on feet  Difficulty in walking, not elsewhere classified  Balance disorder  Rationale for Evaluation and Treatment: Rehabilitation  SUBJECTIVE:                                                                                                                                                                                              SUBJECTIVE STATEMENT:  Pt reports  that she is having  a good day. Was able to put on shoes with laces and tie them, which she has avoided since CVA. Busy week ahead with OT, PT, cardiology, and neurology appointments.      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  PAIN:  Are you having pain? Yes see above  RUE   PRECAUTIONS: None  WEIGHT BEARING RESTRICTIONS: No  FALLS: Has patient fallen in last 6 months? Yes. Number of falls 1, during the stroke and fell in the shower.  PATIENT GOALS: Get more mobility in the arm and less pain in the leg.  OBJECTIVE:   OBJECTIVE:  taken at eval unless otherwise stated.    DIAGNOSTIC FINDINGS:      CLINICAL DATA:  Initial evaluation for intracranial hemorrhage.   EXAM: CT ANGIOGRAPHY HEAD AND NECK    IMPRESSION: 1. 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 Again, prominent arterial contribution to the AVM is seen from the left ACA and posterior circulation, with primary venous drainage into the deep venous system. Associated 7 mm aneurysm along the right anterolateral aspect of the AVM as above. 2. Diffuse tortuosity and ectasia elsewhere about the major arterial vasculature of the head and neck. No large vessel occlusion or hemodynamically significant stenosis. No other acute vascular abnormality.   COGNITION: Overall cognitive status: Within functional limits for tasks assessed             SENSATION: WFL    COORDINATION: Pt limited with quick movements during supination/pronation of the wrist on the R side and also with thumb to finger movements.   POSTURE: rounded shoulders   LOWER EXTREMITY ROM:      Active  Right Eval Left Eval  Hip flexion      Hip extension      Hip abduction      Hip adduction      Hip internal rotation      Hip external rotation      Knee flexion      Knee extension      Ankle dorsiflexion      Ankle plantarflexion      Ankle inversion      Ankle eversion       (Blank rows = not tested)   LOWER EXTREMITY MMT:     MMT Right Eval Left Eval  Hip flexion 4 4+  Hip abduction 4 4+  Hip adduction 4- 4-  Knee flexion 4- 4+  Knee extension 4 4+  Ankle dorsiflexion 4 4+  (Blank rows = not tested)     TRANSFERS: Assistive device utilized: None  Sit to stand: Complete Independence Stand to sit: Complete Independence Chair to chair: Complete Independence   GAIT: Gait pattern: step through pattern, decreased arm swing- Right, decreased step length- Left, decreased stance time- Right, decreased stride length, decreased hip/knee flexion- Right, decreased ankle dorsiflexion- Right, and circumduction- Right Distance walked: 40' Assistive device utilized: None Level of assistance: Complete Independence Comments: Pt able to ambulate safely without AD.   FUNCTIONAL TESTS:  5 times sit to stand: 12/26/22 15.44 sec. 01/30/23: 11.51 sec  Timed up and go (TUG): 15.65 sec 01/30/23: 10.99 sec (average of 2 trials)   6 minute walk test: 619' 2 standing rest breaks 01/30/23 924' no AD and no rest breaks  10 meter walk test: 15.46 sec 01/30/23: normal:13.06sec(.15m/s) and fast: 11.7(.27m/s) Functional gait assessment: 15/30. 01/30/23:  18/30   PATIENT SURVEYS:  FOTO 48/58/ 01/30/23:  51     TODAY'S TREATMENT:                                                                                                                              DATE: 02/18/23  Nustep BLE/BUE  reciprocal movement, AAROM x 5 min level 2, cues or improved trunkal rotation and RUE elbow extension as tolerated by Pt.   Dynamic balance training in parallel bars.  Step up/down 5in step x 8 with RLE and x8 with LLE.  Stepping over 4" hurdle x  8 leading with RLE and x 8 leading with LLE  On airex beam :   tandem walk with light BUE support 2x 4   Side stepping R and L with light BUE support. 2 x 2 bil.   Obstacle course navigation to walk across reg mat, up/over 2 airex pads, stepping over 2 bolsters, up/down 4 steps, up down 5" step. Performed CW and CCW with CGA from PT for safety. UE supported on rail of steps for second bout only.    PATIENT EDUCATION: Education details: pt educated throughout session about proper posture and technique with exercises. Improved exercise technique, movement at target joints, use of target muscles after min to mod verbal, visual, tactile cues.  Person educated: Patient Education method: Explanation Education comprehension: verbalized understanding  HOME EXERCISE PROGRAM:   Access Code: [email or web address] URL: [email or web address] Date: 01/07/2023 Prepared by: Grier Rocher  Exercises - Staggered Sit-to-Stand with BUE push from RLE - 1 x daily - 7 x weekly - 3 sets - 5 reps  Access Code: [email or web address] URL: [email or web address] Date: 01/22/2023 Prepared by: Grier Rocher  Exercises - Seated Figure 4 Piriformis Stretch  - 1 x daily - 7 x weekly - 2 sets - 3 reps - 60 sec hold   PT instructed pt in HEP  Lateral lunge. X 5 bil and UE supported on rail Standing hip flexor stretch x 20 sec bil  Piriformis stretch x 1 min with increasing over pressure with trunk flexion Sit<>stand pushing from RLE x 10 .  Hip abduction x 10 BLE Hip extension x 10 BLE  Cues for posture and proper set up to reduced compensation on the RLE through trunk   GOALS: Goals reviewed with patient? Yes  SHORT TERM GOALS: Target date: 01/23/2023  Pt  will be independent with HEP in order to demonstrate increased ability to perform tasks related to occupation/hobbies. Baseline: Pt not given HEP at initial evaluation. Goal status: MET   LONG TERM GOALS: Target date: 03/20/2023  1.  Patient (> 38 years old) will complete five times sit to stand test in < 15 seconds indicating an increased LE strength and improved balance. Baseline: 15.44 sec. 3/28 11.51 sec  Goal status: MET   2.  Patient will increase FOTO score to equal to or greater than 58 to demonstrate statistically significant improvement in mobility and quality of life.  Baseline: 48 01/30/23: 51  Goal status:  Progressing    3.  Patient will increase FGA score by > 4 points to demonstrate decreased fall risk during functional activities. Baseline: 15/30. 01/30/23:  18/30 Goal status: Progressing    4.  Patient will reduce timed up and go to <11 seconds to reduce fall risk and demonstrate improved transfer/gait ability. Baseline: 15.65 sec. 01/30/23: 10.99 sec  Goal status:  MET   5.  Patient will increase 10 meter walk test to >1.70m/s as to improve gait speed for better community ambulation and to reduce fall risk. Baseline: 15.46 sec. 01/30/23: normal:13.06sec(.55m/s) Goal status: progressing   6.  Patient will increase six minute walk test distance to >1000 for progression to community ambulator and improve gait ability Baseline: 70' with 2 standing rest breaks, no AD. 01/30/23 924' no AD and no rest breaks  Goal status: progressing     ASSESSMENT:  CLINICAL IMPRESSION:  Pt continues to put forth good effort in PT sessions. Pt treatment focused on high level balance training. Noted to have improved posture and R ankle control to manage unlevel surfaces on airex beam, pad, and unlevel red mat. Pt able to incorporate use of UE for support on when inside parallel bars.  Pt will benefit from continued skilled PT interventions to address balance, strength, and coordination  deficits to improve QoL and improve function and safety with IADLs.     OBJECTIVE IMPAIRMENTS: Abnormal gait, decreased activity tolerance, decreased balance, decreased endurance, decreased mobility, difficulty walking, decreased ROM, decreased strength, and pain.   ACTIVITY LIMITATIONS: carrying, lifting, bending, standing, squatting, sleeping, bathing, toileting, reach over head, hygiene/grooming, and locomotion level  PARTICIPATION LIMITATIONS: meal prep, cleaning, laundry, driving, shopping, community activity, and yard work  PERSONAL FACTORS: Age, Past/current experiences, Social background, Time since onset of injury/illness/exacerbation, Transportation, and 3+ comorbidities: asthma, depression, CHF  are also affecting patient's functional outcome.   REHAB POTENTIAL: Good  CLINICAL DECISION MAKING: Stable/uncomplicated  EVALUATION COMPLEXITY: Moderate  PLAN:  PT FREQUENCY: 1-2x/week, preferably 2x/week but due to insurance, may only be able to be seen 1x/week  PT DURATION: 12 weeks  PLANNED INTERVENTIONS: Therapeutic exercises, Therapeutic activity, Neuromuscular re-education, Balance training, Gait training, Patient/Family education, Self Care, Joint mobilization, Joint manipulation, Stair training, Vestibular training, Canalith repositioning, Orthotic/Fit training, DME instructions, Aquatic Therapy, Dry Needling, Electrical stimulation, Spinal manipulation, Spinal mobilization, Cryotherapy, Moist heat, Taping, Manual therapy, and Re-evaluation   PLAN FOR NEXT SESSION:   High intensity gait and balance training. Blaze pods and airex pad.  Endurance training.   Grier Rocher PT, DPT  Physical Therapist - [place]  [facility]  4:05 PM 02/18/23

## 2023-02-19 ENCOUNTER — Ambulatory Visit: Payer: Medicaid Other | Attending: Cardiology | Admitting: Cardiology

## 2023-02-19 ENCOUNTER — Encounter: Payer: Self-pay | Admitting: Cardiology

## 2023-02-19 ENCOUNTER — Ambulatory Visit: Payer: Medicaid Other | Admitting: Occupational Therapy

## 2023-02-19 VITALS — BP 110/82 | HR 80 | Ht 61.0 in | Wt 146.8 lb

## 2023-02-19 DIAGNOSIS — R0600 Dyspnea, unspecified: Secondary | ICD-10-CM

## 2023-02-19 DIAGNOSIS — E7800 Pure hypercholesterolemia, unspecified: Secondary | ICD-10-CM | POA: Diagnosis not present

## 2023-02-19 DIAGNOSIS — Z7689 Persons encountering health services in other specified circumstances: Secondary | ICD-10-CM | POA: Diagnosis not present

## 2023-02-19 NOTE — Therapy (Signed)
OUTPATIENT OCCUPATIONAL THERAPY NEURO TREATMENT NOTE  Patient Name: Anna Tapia MRN: [id number] DOB:August 26, 1991, 32 y.o., female Today's Date: 02/19/2023  PCP: Dr. Margarita Tapia REFERRING PROVIDER: Dr. Margarita Tapia  END OF SESSION:  OT End of Session - 02/19/23 1929     Visit Number 5    Number of Visits 24    Date for OT Re-Evaluation 04/09/23    Progress Note Due on Visit 10    OT Start Time 1600    OT Stop Time 1645    OT Time Calculation (min) 45 min    Activity Tolerance Patient tolerated treatment well    Behavior During Therapy Anna Tapia for tasks assessed/performed             Past Medical History:  Diagnosis Date   Asthma    Brain aneurysm    CHF (congestive heart failure)    Stroke    Past Surgical History:  Procedure Laterality Date   VENTRICULOPERITONEAL SHUNT     Patient Active Problem List   Diagnosis Date Noted   Major depressive disorder, recurrent episode, moderate 02/07/2022   Sepsis secondary to UTI 05/01/2020   VP (ventriculoperitoneal) shunt status 05/01/2020   Right ovarian cyst 05/01/2020   Elevated LFTs 05/01/2020   Vaginal spotting 05/01/2020   ONSET DATE: Sept 2023;   REFERRING DIAG:  I63.9 (ICD-10-CM) - CVA (cerebral vascular accident) (HCC)   THERAPY DIAG:  Muscle weakness (generalized)  Other lack of coordination  Rationale for Evaluation and Treatment: Rehabilitation  SUBJECTIVE:  SUBJECTIVE STATEMENT: Pt reports she was able to tie her shoe today for the first time since her stroke.  Pt accompanied by: self  PERTINENT HISTORY: Per Dr. Caralee Tapia on 11/19/22: HPI Anna Tapia presents to establish care.  She is here with her mom.   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       PRECAUTIONS: None  WEIGHT BEARING RESTRICTIONS: No  PAIN:  Are you having pain? Yes: NPRS scale: 5-6/10 Pain location: Entire R side, arm and leg Pain description: pins and needles in the hand, arm is achy, moving the arm causes stabbing pain Aggravating factors: movement of the arm Relieving factors: rest; pt reports medication is minimally effective  FALLS: Has patient fallen in last 6 months? Yes. 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  Being independent."  OBJECTIVE:   HAND DOMINANCE: Left  ADLs: Overall ADLs: mostly performed with the LUE Transfers/ambulation related to ADLs: indep/extra time Eating: cuts food 1 handed Grooming: 1 handed (L) UB Dressing: occasional help to don clothes if tired after a shower;  help with straightening clothes; struggles with clothing fasteners LB Dressing: wearing slip on shoes; difficulty with clothing fasteners  Toileting: indep Bathing: modified indep; pt makes attempts to engage the R arm while bathing but reports this is difficult Tub Shower transfers: modified indep Equipment:  see above  IADLs: Shopping: pt can go shopping accompanied by a family member Light housekeeping: 1 handed Meal Prep: cooking causes fatigue; mostly L handed Community mobility: uses rollator for community  mobility; uses [employer] transportation Medication management: uses weekly pill organizer; Engineer, materials: mother manages (pt states she doesn't really have any bills)  Handwriting:  N/A (pt is L hand dominant)  MOBILITY STATUS:  ambulatory without AD; pt seeing PT for LE strengthening and balance deficits  POSTURE COMMENTS:  rounded shoulders Sitting balance: Moves/returns truncal midpoint >2 inches in all planes  ACTIVITY TOLERANCE: Activity tolerance: Pain in the R side significantly limits activity tolerance.  Pt reports she can get fatigued with ADLs and IADLs.  FUNCTIONAL OUTCOME MEASURES: FOTO: 44; 48  UPPER EXTREMITY ROM:    Active ROM Right eval Left Eval WNL  Shoulder flexion 38 (60)   Shoulder abduction    Shoulder adduction 42 (51)   Shoulder extension    Shoulder internal rotation    Shoulder external rotation Hand to back of head (scaption and chin tuck)   Elbow flexion WNL   Elbow extension WNL   Wrist flexion WNL   Wrist extension WNL   Wrist ulnar deviation    Wrist radial deviation    Wrist pronation WNL   Wrist supination WNL   (Blank rows = not tested)  R hand: Able to oppose each digit to thumb with extra time and able to make full composite fist  UPPER EXTREMITY MMT:     MMT Right eval Left Eval 5/5  Shoulder flexion 3-   Shoulder abduction 3-   Shoulder adduction    Shoulder extension    Shoulder internal rotation 3+   Shoulder external rotation 3+   Middle trapezius    Lower trapezius    Elbow flexion 4-   Elbow extension 4   Wrist flexion 4-   Wrist extension 3+   Wrist ulnar deviation    Wrist radial deviation    Wrist pronation    Wrist supination    (Blank rows = not tested)  HAND FUNCTION: Grip strength: Right: 4 lbs; Left: 50 lbs, Lateral pinch: Right: 0 lbs, Left: 17 lbs, and 3 point pinch: Right: 0 lbs, Left: 19 lbs  COORDINATION: Finger Nose Finger test: able to touch nose with increased time 9 Hole Peg  test: Right: unable (picked up a peg with repeat trials but unable to move the peg from horizontal to vertical position to place into board) sec; Left: 24 sec  SENSATION: tingling in the R hand  Light touch: Impaired  Proprioception: WFL  EDEMA: N/A  MUSCLE TONE: RUE: Mild and Hypertonic  COGNITION: Overall cognitive status: Within functional limits for tasks assessed  VISION: Subjective report: Pt reports increased blurred vision following her stroke Baseline vision: Wears glasses all the time  VISION ASSESSMENT: Ocular ROM: WFL Gaze preference/alignment: gaze left; slight Tracking/Visual pursuits: Able to track stimulus in all quads without difficulty Saccades: WFL Visual Fields: no apparent deficits  PERCEPTION: WFL  PRAXIS: Impaired: Motor planning  OBSERVATIONS: Pt guards RUE with flexed elbow and shoulder IR/hand to abdomen during mobility.  R scapular elevation noting increased tension in R upper trap.  TODAY'S TREATMENT:  Therapeutic Exercise: Performed passive R scapular depression and retraction, and reps of gentle passive stretching throughout the RUE, including all planes for R shoulder, elbow, forearm, and R wrist and digit flex/ext.  Reviewed self PROM for R shoulder flexion clasping hands and reaching overhead; good return demo.  Facilitated R hand strengthening with use of hand gripper set at min resistance with 1 red band to complete 3 sets 10 reps, min vc for technique to maximize hand closure with each rep.   Neuro re-ed: Facilitated R hand FMC/dexterity skills working to remove ball pegs from pegboard using 2 and 3 point pinch patterns.  Pt made some attempts at picking up ball pegs from washcloth, but fingers repeatedly slipped from prehension on peg.  Pt was able to grasp a peg from OT 's hand and pt was able to place peg into pegboard  with this set up assist, but not when peg moved on table top.   PATIENT EDUCATION: Education details: 2 and 3 point pinch patterns Person educated: Patient Education method: Explanation and Verbal cues, demo Education comprehension: able to return demo.   HOME EXERCISE PROGRAM: Soft blue theraputty exercises  GOALS: Goals reviewed with patient? Yes  SHORT TERM GOALS: Target date: 02/26/23 (6 weeks)  Pt will be indep to perform HEP for increasing RUE flexibility, strength, and coordination to better engage the RUE into daily tasks. Baseline: Not yet initiated Goal status: INITIAL  LONG TERM GOALS: Target date: 04/09/23 (12 weeks)  Pt will increase FOTO score to 48 or better to indicate clinically relevant improvement in self perceived use of R arm for daily tasks.  Baseline: Eval: 44  Goal status: INITIAL  2.  Pt will increase R active shoulder flexion to 100* or better to achieve functional ROM for UB ADLs and reaching for ADL supplies. Baseline: Eval: R 38 degrees (passive 60); pt reaches during ADLs only with the L arm Goal status: INITIAL  3.  Pt will increase R grip strength by 10 or more lbs to enable use of R hand to hold and carry light ADL supplies. Baseline: Eval: R grip 4 lbs, L 50 lbs Goal status: INITIAL  4.  Pt will report <4/10 pain throughout the RUE to enable improved tolerance with using RUE during daily tasks.  Baseline: Eval: 6-7/10 pain throughout the R side (pt predominantly uses L hand for ADLs) Goal status: INITIAL  5.  Pt will increase R hand dexterity/FMC skills to manipulate clothing fasteners with bilat hands. Baseline: Eval: pt avoids clothing fasteners or manages with extra time using L hand only (R 9 hole: unable, L 24 sec) Goal status: INITIAL  ASSESSMENT:  CLINICAL IMPRESSION: Pt reporting 5-6/10 pain today throughout the RUE, but tolerated passive stretching well with moist heat applied simultaneously to R shoulder.  Pt reports she continues  to work to engage the RUE into daily tasks, and stated that she was able to tie her shoes today for the first time since her stroke.  Pt is demonstrating increase range at the R shoulder with flexion and abd.  R hand fingers tend to slip from a 3 point and 2 point pinch pattern, often requiring L hand to assist with set up of items in R hand, or pt would grasp a peg from OT 's hand, then practiced removing them from pegboard.  Pt not yet able to pick up a ball peg from table top without peg being stabilized.  Pt will continue to benefit from skilled OT to reduce  R shoulder stiffness, improve RUE strength and coordination, and reduce pain throughout the RUE in order to maximize functional use of the RUE with daily tasks.   PERFORMANCE DEFICITS: in functional skills including ADLs, IADLs, coordination, dexterity, sensation, tone, ROM, strength, pain, flexibility, Fine motor control, Gross motor control, mobility, balance, body mechanics, decreased knowledge of use of DME, vision, and UE functional use.  IMPAIRMENTS: are limiting patient from ADLs, IADLs, rest and sleep, work, and leisure.   CO-MORBIDITIES: has co-morbidities such as CHF, depression  that affects occupational performance. Patient will benefit from skilled OT to address above impairments and improve overall function.  MODIFICATION OR ASSISTANCE TO COMPLETE EVALUATION: No modification of tasks or assist necessary to complete an evaluation.  OT OCCUPATIONAL PROFILE AND HISTORY: Problem focused assessment: Including review of records relating to presenting problem.  CLINICAL DECISION MAKING: Moderate - several treatment options, min-mod task modification necessary  REHAB POTENTIAL: Good  EVALUATION COMPLEXITY: Moderate    PLAN:  OT FREQUENCY: 1-2x/week (pending any visit limitations with insurance), but would benefit from 2x/week  OT DURATION: 12 weeks  PLANNED INTERVENTIONS: self care/ADL training, therapeutic exercise,  therapeutic activity, neuromuscular re-education, manual therapy, passive range of motion, balance training, moist heat, cryotherapy, patient/family education, coping strategies training, and DME and/or AE instructions  RECOMMENDED OTHER SERVICES: OT recommended pt follow up with her eye doctor as she does report some increased blurred vision since her CVA  CONSULTED AND AGREED WITH PLAN OF CARE: Patient  PLAN FOR NEXT SESSION: see above  Danelle Earthly, MS, OTR/L   Otis Dials, OT 02/19/2023, 7:35 PM

## 2023-02-19 NOTE — Progress Notes (Signed)
Cardiology Office Note:    Date:  02/19/2023   ID:  Anna Tapia, DOB 1991-07-29, MRN [id number]  PCP:  Margarita Mail, DO   [facility] Providers Cardiologist:  Debbe Odea, MD     Referring MD: Margarita Mail, DO   Chief Complaint  Patient presents with   New Patient (Initial Visit)    Patient has history of CHF.  Not followed with cardiac since pediatric cardiology.  Was sween once at [facility] cardio in 2018.  Stroke in 07/2022 with loss of strength in right arm.    History of Present Illness:    Anna Tapia is a 32 y.o. female with a hx of hemorrhagic stroke(left-sided midbrain, thalamic ICH), PCA and ACA aneurysm s/p Onyx embolization 07/2022, VP shunt in childhood, presenting with shortness of breath..  Previously seen by [facility] cardiology in 2018 from a cardiac perspective.  Notes indicate history of congenital CHF.  Was previously on digoxin.  She denies chest pain, states having shortness of breath with minimal exertion.  She has right arm weakness as a result of stroke.  Denies chest pain.  Outside echo normal systolic function, 07/2022 EF 58%, GLS -20.9%.  No significant change from prior.  Past Medical History:  Diagnosis Date   Asthma    Brain aneurysm    CHF (congestive heart failure)    Stroke     Past Surgical History:  Procedure Laterality Date   VENTRICULOPERITONEAL SHUNT      Current Medications: Current Meds  Medication Sig   acetaminophen (TYLENOL) 325 MG tablet Take 650 mg by mouth every 6 (six) hours as needed.   baclofen (LIORESAL) 10 MG tablet Take 1 tablet by mouth twice daily   dibucaine (NUPERCAINAL) 1 % OINT Place 1 application  rectally 3 (three) times daily as needed for hemorrhoids.   famotidine (PEPCID) 20 MG tablet Take 1 tablet (20 mg total) by mouth 2 (two) times daily.   fluticasone-salmeterol (ADVAIR) 100-50 MCG/ACT AEPB Inhale 1 puff into the lungs 2 (two) times daily.   pregabalin (LYRICA) 50 MG capsule  Take 50 mg by mouth 2 (two) times daily.     Allergies:   Patient has no known allergies.   Social History   Socioeconomic History   Marital status: Single    Spouse name: Not on file   Number of children: Not on file   Years of education: Not on file   Highest education level: Not on file  Occupational History   Not on file  Tobacco Use   Smoking status: Former    Types: Cigarettes   Smokeless tobacco: Never  Vaping Use   Vaping Use: Former   Substances: Nicotine, Flavoring  Substance and Sexual Activity   Alcohol use: Yes    Comment: Rare   Drug use: Never   Sexual activity: Yes    Partners: Male    Birth control/protection: Condom  Other Topics Concern   Not on file  Social History Narrative   Not on file   Social Determinants of Health   Financial Resource Strain: Not on file  Food Insecurity: Not on file  Transportation Needs: Not on file  Physical Activity: Not on file  Stress: Not on file  Social Connections: Not on file     Family History: The patient's family history includes Cancer in her maternal aunt and maternal uncle; Diabetes in her mother; Hypertension in her mother; Seizures in her mother.  ROS:   Please see the history of  present illness.     All other systems reviewed and are negative.  EKGs/Labs/Other Studies Reviewed:    The following studies were reviewed today:   EKG:  EKG is  ordered today.  The ekg ordered today demonstrates normal sinus rhythm.  Recent Labs: 11/19/2022: ALT 13; BUN 7; Creat 0.71; Hemoglobin 13.0; Platelets 415; Potassium 4.5; Sodium 140  Recent Lipid Panel    Component Value Date/Time   CHOL 206 (H) 11/19/2022 1143   TRIG 63 11/19/2022 1143   HDL 70 11/19/2022 1143   CHOLHDL 2.9 11/19/2022 1143   LDLCALC 121 (H) 11/19/2022 1143     Risk Assessment/Calculations:             Physical Exam:    VS:  BP 110/82 (BP Location: Right Arm, Patient Position: Sitting, Cuff Size: Normal)   Pulse 80   Ht   (1.549 m)   Wt 146 lb 12.8 oz (66.6 kg)   SpO2 99%   BMI 27.74 kg/m     Wt Readings from Last 3 Encounters:  02/19/23 146 lb 12.8 oz (66.6 kg)  11/19/22 141 lb 1.6 oz (64 kg)  07/03/22 123 lb 14.4 oz (56.2 kg)     GEN:  Well nourished, appears tired, soft-spoken HEENT: Normal NECK: No JVD; No carotid bruits CARDIAC: RRR, no murmurs, rubs, gallops RESPIRATORY:  Clear to auscultation without rales, wheezing or rhonchi  ABDOMEN: Soft, non-tender, non-distended MUSCULOSKELETAL:  No edema; right arm weakness SKIN: Warm and dry NEUROLOGIC:  Alert and oriented x 3, right arm weakness PSYCHIATRIC:  Normal affect   ASSESSMENT:    1. Dyspnea, unspecified type   2. Pure hypercholesterolemia    PLAN:    In order of problems listed above:  Shortness of breath, get echo to rule out any structural abnormalities.  Deconditioning likely contributing. Hyperlipidemia, low-cholesterol, heart healthy diet advised.  Follow-up after echo      Medication Adjustments/Labs and Tests Ordered: Current medicines are reviewed at length with the patient today.  Concerns regarding medicines are outlined above.  Orders Placed This Encounter  Procedures   EKG 12-Lead   ECHOCARDIOGRAM COMPLETE   No orders of the defined types were placed in this encounter.   Patient Instructions  Medication Instructions:   Your physician recommends that you continue on your current medications as directed. Please refer to the Current Medication list given to you today.  *If you need a refill on your cardiac medications before your next appointment, please call your pharmacy*   Lab Work:  None ordered  If you have labs (blood work) drawn today and your tests are completely normal, you will receive your results only by: MyChart Message (if you have MyChart) OR A paper copy in the mail If you have any lab test that is abnormal or we need to change your treatment, we will call you to review the  results.   Testing/Procedures:  Your physician has requested that you have an echocardiogram. Echocardiography is a painless test that uses sound waves to create images of your heart. It provides your doctor with information about the size and shape of your heart and how well your heart's chambers and valves are working. This procedure takes approximately one hour. There are no restrictions for this procedure. Please do NOT wear cologne, perfume, aftershave, or lotions (deodorant is allowed). Please arrive 15 minutes prior to your appointment time.   Follow-Up: At [facility], you and your health needs are our priority.  As part  of our continuing mission to provide you with exceptional heart care, we have created designated Provider Care Teams.  These Care Teams include your primary Cardiologist (physician) and Advanced Practice Providers (APPs -  Physician Assistants and Nurse Practitioners) who all work together to provide you with the care you need, when you need it.  We recommend signing up for the patient portal called "MyChart".  Sign up information is provided on this After Visit Summary.  MyChart is used to connect with patients for Virtual Visits (Telemedicine).  Patients are able to view lab/test results, encounter notes, upcoming appointments, etc.  Non-urgent messages can be sent to your provider as well.   To learn more about what you can do with MyChart, go to [email or web address].    Your next appointment:    After Echocardiogram   Provider:   You may see Debbe Odea, MD or one of the following Advanced Practice Providers on your designated Care Team:   Nicolasa Ducking, NP Eula Listen, PA-C Cadence Fransico Michael, PA-C Charlsie Quest, NP    Signed, Debbe Odea, MD  02/19/2023 12:28 PM    [facility]

## 2023-02-19 NOTE — Patient Instructions (Signed)
Medication Instructions:   Your physician recommends that you continue on your current medications as directed. Please refer to the Current Medication list given to you today.  *If you need a refill on your cardiac medications before your next appointment, please call your pharmacy*   Lab Work:  None ordered  If you have labs (blood work) drawn today and your tests are completely normal, you will receive your results only by: MyChart Message (if you have MyChart) OR A paper copy in the mail If you have any lab test that is abnormal or we need to change your treatment, we will call you to review the results.   Testing/Procedures:  Your physician has requested that you have an echocardiogram. Echocardiography is a painless test that uses sound waves to create images of your heart. It provides your doctor with information about the size and shape of your heart and how well your heart's chambers and valves are working. This procedure takes approximately one hour. There are no restrictions for this procedure. Please do NOT wear cologne, perfume, aftershave, or lotions (deodorant is allowed). Please arrive 15 minutes prior to your appointment time.    Follow-Up: At [facility], you and your health needs are our priority.  As part of our continuing mission to provide you with exceptional heart care, we have created designated Provider Care Teams.  These Care Teams include your primary Cardiologist (physician) and Advanced Practice Providers (APPs -  Physician Assistants and Nurse Practitioners) who all work together to provide you with the care you need, when you need it.  We recommend signing up for the patient portal called "MyChart".  Sign up information is provided on this After Visit Summary.  MyChart is used to connect with patients for Virtual Visits (Telemedicine).  Patients are able to view lab/test results, encounter notes, upcoming appointments, etc.  Non-urgent messages can  be sent to your provider as well.   To learn more about what you can do with MyChart, go to [email or web address].    Your next appointment:    After Echocardiogram  Provider:   You may see Brian Agbor-Etang, MD or one of the following Advanced Practice Providers on your designated Care Team:   Christopher Berge, NP Ryan Dunn, PA-C Cadence Furth, PA-C Sheri Hammock, NP 

## 2023-02-20 ENCOUNTER — Ambulatory Visit: Payer: Medicaid Other | Admitting: Physical Therapy

## 2023-02-20 DIAGNOSIS — F32A Depression, unspecified: Secondary | ICD-10-CM | POA: Diagnosis not present

## 2023-02-20 DIAGNOSIS — I613 Nontraumatic intracerebral hemorrhage in brain stem: Secondary | ICD-10-CM | POA: Diagnosis not present

## 2023-02-20 DIAGNOSIS — Q282 Arteriovenous malformation of cerebral vessels: Secondary | ICD-10-CM | POA: Diagnosis not present

## 2023-02-23 NOTE — Progress Notes (Unsigned)
Established Patient Office Visit  Subjective    Patient ID: Anna Tapia, female    DOB: January 16, 1991  Age: 32 y.o. MRN: [id number]  CC:  No chief complaint on file.   HPI Anna Tapia presents to follow up on chronic medical conditions.  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  Lipid Panel     Component Value Date/Time   CHOL 206 (H) 11/19/2022 1143   TRIG 63 11/19/2022 1143   HDL 70 11/19/2022 1143   CHOLHDL 2.9 11/19/2022 1143   LDLCALC 121 (H) 11/19/2022 1143    Hx of CHF as a newborn: -Had been following with Cardiology at [facility], last seen in 2018 -Last echo 9/23 EF 58% -Denies chest pain, palpitations, shortness of breath or lower extremity swelling  Asthma:  -Asthma status: better -Current Treatments: Advair restarted at LOV -Satisfied with current treatment?: yes -Dyspnea frequency: improved  -Wheezing frequency: none -Cough frequency: improved  -Nocturnal symptom frequency: none  -Limitation of activity: yes -Current upper respiratory symptoms: no -Triggers: cold air  -Pneumovax: unknown -Influenza: Not up to Date  GERD: -Currently on Pepcid 20 mg PRN  MDD: -Mood status: uncontrolled -Current treatment: Prozac 10 mg, just started on 02/20/23 by Neurology -Satisfied with current treatment?:  difficult to tell yet -Duration of current treatment :  days -Side effects: no Medication compliance: excellent compliance     02/24/2023   12:58 PM 11/19/2022   10:58 AM  02/07/2022   10:27 AM 01/21/2022    8:57 AM  Depression screen PHQ 2/9  Decreased Interest Down, Depressed, Hopeless PHQ - 2 Score Altered sleeping  3    Tired, decreased energy  3    Change in appetite  1    Feeling bad or failure about yourself   2    Trouble concentrating  2    Moving slowly or fidgety/restless  1    Suicidal thoughts  1    PHQ-9 Score  18    Difficult doing work/chores  Somewhat difficult       Information is confidential and restricted. Go to Review Flowsheets to unlock data.    Health Maintenance: -Blood work UTD -Pap 3/23 negative but positive for HPV  Outpatient Encounter Medications as of 02/24/2023  Medication Sig   acetaminophen (TYLENOL) 325 MG tablet Take 650 mg by mouth every 6 (six) hours as needed.   baclofen (LIORESAL) 10 MG tablet Take 1 tablet by mouth twice daily   dibucaine (NUPERCAINAL) 1 % OINT Place 1 application  rectally 3 (three) times daily as needed for hemorrhoids.   famotidine (PEPCID) 20 MG tablet Take 1 tablet (20 mg total) by mouth 2 (two) times daily.   fluticasone-salmeterol (ADVAIR) 100-50 MCG/ACT AEPB Inhale 1 puff into the lungs 2 (two) times daily.   pregabalin (LYRICA) 25 MG capsule Take 25 mg by mouth 2 (two) times daily.   pregabalin (LYRICA) 50 MG capsule Take 50 mg by mouth 2 (  two) times daily.   No facility-administered encounter medications on file as of 02/24/2023.    Past Medical History:  Diagnosis Date   Asthma    Brain aneurysm    CHF (congestive heart failure)    Stroke     Past Surgical History:  Procedure Laterality Date   VENTRICULOPERITONEAL SHUNT      Family History  Problem Relation Age of Onset   Seizures Mother    Hypertension Mother    Diabetes Mother    Cancer Maternal Aunt    Cancer Maternal Uncle     Social History   Socioeconomic History   Marital status: Single    Spouse name: Not on file   Number of children: Not on file   Years of education: Not on  file   Highest education level: Some college, no degree  Occupational History   Not on file  Tobacco Use   Smoking status: Former    Types: Cigarettes   Smokeless tobacco: Never  Vaping Use   Vaping Use: Former   Substances: Nicotine, Flavoring  Substance and Sexual Activity   Alcohol use: Yes    Comment: Rare   Drug use: Never   Sexual activity: Yes    Partners: Male    Birth control/protection: Condom  Other Topics Concern   Not on file  Social History Narrative   Not on file   Social Determinants of Health   Financial Resource Strain: High Risk (02/20/2023)   Overall Financial Resource Strain (CARDIA)    Difficulty of Paying Living Expenses: Hard  Food Insecurity: Food Insecurity Present (02/20/2023)   Hunger Vital Sign    Worried About Running Out of Food in the Last Year: Often true    Ran Out of Food in the Last Year: Sometimes true  Transportation Needs: No Transportation Needs (02/20/2023)   PRAPARE - Administrator, Civil Service (Medical): No    Lack of Transportation (Non-Medical): No  Physical Activity: Insufficiently Active (02/20/2023)   Exercise Vital Sign    Days of Exercise per Week: 5 days    Minutes of Exercise per Session: 10 min  Stress: Stress Concern Present (02/20/2023)   [employer] of Occupational Health - Occupational Stress Questionnaire    Feeling of Stress : Very much  Social Connections: Socially Isolated (02/20/2023)   Social Connection and Isolation Panel [NHANES]    Frequency of Communication with Friends and Family: Never    Frequency of Social Gatherings with Friends and Family: Once a week    Attends Religious Services: Never    Database administrator or Organizations: No    Attends Engineer, structural: Not on file    Marital Status: Never married  Intimate Partner Violence: Not At Risk (01/21/2022)   Humiliation, Afraid, Rape, and Kick questionnaire    Fear of Current or Ex-Partner: No    Emotionally  Abused: No    Physically Abused: No    Sexually Abused: No    Review of Systems  Constitutional:  Negative for chills and fever.  Eyes:  Negative for blurred vision.  Respiratory:  Negative for cough, shortness of breath and wheezing.   Cardiovascular:  Negative for chest pain, palpitations and leg swelling.  Neurological:  Positive for sensory change and focal weakness.        Objective    Pulse 98   Temp 98.5 F (36.9 C)   Resp 16   Wt 149 lb 1.6 oz (67.6 kg)  SpO2 97%   BMI 28.17 kg/m   Physical Exam Constitutional:      Appearance: Normal appearance.  HENT:     Head: Normocephalic and atraumatic.  Eyes:     Conjunctiva/sclera: Conjunctivae normal.  Cardiovascular:     Rate and Rhythm: Normal rate and regular rhythm.  Pulmonary:     Effort: Pulmonary effort is normal.     Breath sounds: Normal breath sounds.  Skin:    General: Skin is warm and dry.  Neurological:     Mental Status: She is alert. Mental status is at baseline.  Psychiatric:        Mood and Affect: Mood normal. Affect is blunt.        Behavior: Behavior normal.         Assessment & Plan:   1. History of CVA with residual deficit: Stable, following with Neurology, note reviewed from 02/20/23. Currently on Lyrica, will refill Baclofen 10 mg BID today.   - baclofen (LIORESAL) 10 MG tablet; Take 1 tablet (10 mg total) by mouth 2 (two) times daily.  Dispense: 60 tablet; Refill: 3  2. Intermittent asthma without complication, unspecified asthma severity: Stable, refill Advair and Albuterol PRN.  - fluticasone-salmeterol (ADVAIR) 100-50 MCG/ACT AEPB; Inhale 1 puff into the lungs 2 (two) times daily.  Dispense: 60 each; Refill: 3 - albuterol (VENTOLIN HFA) 108 (90 Base) MCG/ACT inhaler; Inhale 2 puffs into the lungs every 6 (six) hours as needed for wheezing or shortness of breath.  Dispense: 8 g; Refill: 0  3. Episode of recurrent major depressive disorder, unspecified depression episode  severity: Just started on Prozac 10 mg, will follow up in 3 month to recheck.    Return in about 3 months (around 05/26/2023).   Margarita Mail, DO

## 2023-02-24 ENCOUNTER — Ambulatory Visit (INDEPENDENT_AMBULATORY_CARE_PROVIDER_SITE_OTHER): Payer: Medicaid Other | Admitting: Internal Medicine

## 2023-02-24 ENCOUNTER — Encounter: Payer: Self-pay | Admitting: Internal Medicine

## 2023-02-24 VITALS — BP 120/76 | HR 98 | Temp 98.5°F | Resp 16 | Ht 60.0 in | Wt 149.1 lb

## 2023-02-24 DIAGNOSIS — J4520 Mild intermittent asthma, uncomplicated: Secondary | ICD-10-CM | POA: Diagnosis not present

## 2023-02-24 DIAGNOSIS — I6930 Unspecified sequelae of cerebral infarction: Secondary | ICD-10-CM

## 2023-02-24 DIAGNOSIS — F339 Major depressive disorder, recurrent, unspecified: Secondary | ICD-10-CM | POA: Diagnosis not present

## 2023-02-24 DIAGNOSIS — Z7689 Persons encountering health services in other specified circumstances: Secondary | ICD-10-CM | POA: Diagnosis not present

## 2023-02-24 MED ORDER — FLUTICASONE-SALMETEROL 100-50 MCG/ACT IN AEPB: 1.0000 | INHALATION_SPRAY | Freq: Two times a day (BID) | RESPIRATORY_TRACT | 3 refills | Status: DC

## 2023-02-24 MED ORDER — ALBUTEROL SULFATE HFA 108 (90 BASE) MCG/ACT IN AERS: 2.0000 | INHALATION_SPRAY | Freq: Four times a day (QID) | RESPIRATORY_TRACT | 0 refills | Status: DC | PRN

## 2023-02-24 MED ORDER — BACLOFEN 10 MG PO TABS: 10.0000 mg | ORAL_TABLET | Freq: Two times a day (BID) | ORAL | 3 refills | Status: DC

## 2023-02-25 ENCOUNTER — Ambulatory Visit: Payer: Medicaid Other | Admitting: Occupational Therapy

## 2023-02-25 ENCOUNTER — Encounter: Payer: Self-pay | Admitting: Occupational Therapy

## 2023-02-25 ENCOUNTER — Encounter: Payer: Self-pay | Admitting: Physical Therapy

## 2023-02-25 ENCOUNTER — Ambulatory Visit: Payer: Medicaid Other | Admitting: Physical Therapy

## 2023-02-25 DIAGNOSIS — R262 Difficulty in walking, not elsewhere classified: Secondary | ICD-10-CM

## 2023-02-25 DIAGNOSIS — R278 Other lack of coordination: Secondary | ICD-10-CM

## 2023-02-25 DIAGNOSIS — M6281 Muscle weakness (generalized): Secondary | ICD-10-CM

## 2023-02-25 DIAGNOSIS — R2689 Other abnormalities of gait and mobility: Secondary | ICD-10-CM

## 2023-02-25 DIAGNOSIS — Z7689 Persons encountering health services in other specified circumstances: Secondary | ICD-10-CM | POA: Diagnosis not present

## 2023-02-25 DIAGNOSIS — R2681 Unsteadiness on feet: Secondary | ICD-10-CM

## 2023-02-25 NOTE — Therapy (Signed)
OUTPATIENT OCCUPATIONAL THERAPY NEURO TREATMENT NOTE  Patient Name: Anna Tapia MRN: [id number] DOB:Mar 12, 1991, 32 y.o., female Today's Date: 02/25/2023  PCP: Dr. Margarita Mail REFERRING PROVIDER: Dr. Margarita Mail  END OF SESSION:  OT End of Session - 02/25/23 1430     Visit Number 6    Number of Visits 24    Date for OT Re-Evaluation 04/09/23    Progress Note Due on Visit 10    OT Start Time 1430    OT Stop Time 1515    OT Time Calculation (min) 45 min    Activity Tolerance Patient tolerated treatment well    Behavior During Therapy [facility] for tasks assessed/performed             Past Medical History:  Diagnosis Date   Asthma    Brain aneurysm    CHF (congestive heart failure)    Stroke    Past Surgical History:  Procedure Laterality Date   VENTRICULOPERITONEAL SHUNT     Patient Active Problem List   Diagnosis Date Noted   Major depressive disorder, recurrent episode, moderate 02/07/2022   Sepsis secondary to UTI 05/01/2020   VP (ventriculoperitoneal) shunt status 05/01/2020   Right ovarian cyst 05/01/2020   Elevated LFTs 05/01/2020   Vaginal spotting 05/01/2020   ONSET DATE: Sept 2023;   REFERRING DIAG:  I63.9 (ICD-10-CM) - CVA (cerebral vascular accident) (HCC)   THERAPY DIAG:  Muscle weakness (generalized)  Other lack of coordination  Rationale for Evaluation and Treatment: Rehabilitation  SUBJECTIVE:  SUBJECTIVE STATEMENT: Pt reports she hurt her finger trying to tie her shoes and is taking a break from those shoes.  Pt accompanied by: self  PERTINENT HISTORY: Per Dr. Caralee Ates on 11/19/22: HPI [name] presents to establish care.  She is here with her mom.   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       PRECAUTIONS: None  WEIGHT BEARING RESTRICTIONS: No  PAIN:  Are you having pain? Yes: NPRS scale: 5-6/10 Pain location: Entire R side, arm and leg Pain description: pins and needles in the hand, arm is achy, moving the arm causes stabbing pain Aggravating factors: movement of the arm Relieving factors: rest; pt reports medication is minimally effective  FALLS: Has patient fallen in last 6 months? Yes. 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  Being independent."  OBJECTIVE:   HAND DOMINANCE: Left  ADLs: Overall ADLs: mostly performed with the LUE Transfers/ambulation related to ADLs: indep/extra time Eating: cuts food 1 handed Grooming: 1 handed (L) UB Dressing: occasional help to don clothes if tired after  a shower; help with straightening clothes; struggles with clothing fasteners LB Dressing: wearing slip on shoes; difficulty with clothing fasteners  Toileting: indep Bathing: modified indep; pt makes attempts to engage the R arm while bathing but reports this is difficult Tub Shower transfers: modified indep Equipment:  see above  IADLs: Shopping: pt can go shopping accompanied by a family member Light housekeeping: 1 handed Meal Prep: cooking causes fatigue; mostly L handed Community mobility: uses rollator for  community mobility; uses [employer] transportation Medication management: uses weekly pill organizer; Engineer, materials: mother manages (pt states she doesn't really have any bills)  Handwriting:  N/A (pt is L hand dominant)  MOBILITY STATUS:  ambulatory without AD; pt seeing PT for LE strengthening and balance deficits  POSTURE COMMENTS:  rounded shoulders Sitting balance: Moves/returns truncal midpoint >2 inches in all planes  ACTIVITY TOLERANCE: Activity tolerance: Pain in the R side significantly limits activity tolerance.  Pt reports she can get fatigued with ADLs and IADLs.  FUNCTIONAL OUTCOME MEASURES: FOTO: 44; 48  UPPER EXTREMITY ROM:    Active ROM Right eval Left Eval WNL  Shoulder flexion 38 (60)   Shoulder abduction    Shoulder adduction 42 (51)   Shoulder extension    Shoulder internal rotation    Shoulder external rotation Hand to back of head (scaption and chin tuck)   Elbow flexion WNL   Elbow extension WNL   Wrist flexion WNL   Wrist extension WNL   Wrist ulnar deviation    Wrist radial deviation    Wrist pronation WNL   Wrist supination WNL   (Blank rows = not tested)  R hand: Able to oppose each digit to thumb with extra time and able to make full composite fist  UPPER EXTREMITY MMT:     MMT Right eval Left Eval 5/5  Shoulder flexion 3-   Shoulder abduction 3-   Shoulder adduction    Shoulder extension    Shoulder internal rotation 3+   Shoulder external rotation 3+   Middle trapezius    Lower trapezius    Elbow flexion 4-   Elbow extension 4   Wrist flexion 4-   Wrist extension 3+   Wrist ulnar deviation    Wrist radial deviation    Wrist pronation    Wrist supination    (Blank rows = not tested)  HAND FUNCTION: Grip strength: Right: 4 lbs; Left: 50 lbs, Lateral pinch: Right: 0 lbs, Left: 17 lbs, and 3 point pinch: Right: 0 lbs, Left: 19 lbs  COORDINATION: Finger Nose Finger test: able to touch nose with increased time 9  Hole Peg test: Right: unable (picked up a peg with repeat trials but unable to move the peg from horizontal to vertical position to place into board) sec; Left: 24 sec  SENSATION: tingling in the R hand  Light touch: Impaired  Proprioception: WFL  EDEMA: N/A  MUSCLE TONE: RUE: Mild and Hypertonic  COGNITION: Overall cognitive status: Within functional limits for tasks assessed  VISION: Subjective report: Pt reports increased blurred vision following her stroke Baseline vision: Wears glasses all the time  VISION ASSESSMENT: Ocular ROM: WFL Gaze preference/alignment: gaze left; slight Tracking/Visual pursuits: Able to track stimulus in all quads without difficulty Saccades: WFL Visual Fields: no apparent deficits  PERCEPTION: WFL  PRAXIS: Impaired: Motor planning  OBSERVATIONS: Pt guards RUE with flexed elbow and shoulder IR/hand to abdomen during mobility.  R scapular elevation noting increased tension in R upper trap.  TODAY'S TREATMENT:  Therapeutic Exercise: Performed passive stretching throughout the RUE, including all planes for R shoulder, elbow, forearm, and R wrist and digit flex/ext. Educated on neck stretches at home to decrease neck pain. Seated AROM with use of mirror for feedback, 1 set x 10 reps each - shoulder flexion, scapular retraction, and chin tucks.   Neuro re-ed: Pt focused on improving [facility] with manipulating nuts and bolts positioned horizontally. Pt unscrewed both the 1" and 2" nuts using non-dominant R hand as stabilizer and L hand to manipulate nut. Pt removed and replaced bolt using R hand. Pt required increased time and cues for hand control during the task. Focused on storing objects in palm while picking up flat [place] stones and distributing them across the board. Utilized R thumb to manipulate objects in palm.    PATIENT  EDUCATION: Education details: 2 and 3 point pinch patterns Person educated: Patient Education method: Explanation and Verbal cues, demo Education comprehension: able to return demo.   HOME EXERCISE PROGRAM: Soft blue theraputty exercises  GOALS: Goals reviewed with patient? Yes  SHORT TERM GOALS: Target date: 02/26/23 (6 weeks)  Pt will be indep to perform HEP for increasing RUE flexibility, strength, and coordination to better engage the RUE into daily tasks. Baseline: Not yet initiated Goal status: INITIAL  LONG TERM GOALS: Target date: 04/09/23 (12 weeks)  Pt will increase FOTO score to 48 or better to indicate clinically relevant improvement in self perceived use of R arm for daily tasks.  Baseline: Eval: 44  Goal status: INITIAL  2.  Pt will increase R active shoulder flexion to 100* or better to achieve functional ROM for UB ADLs and reaching for ADL supplies. Baseline: Eval: R 38 degrees (passive 60); pt reaches during ADLs only with the L arm Goal status: INITIAL  3.  Pt will increase R grip strength by 10 or more lbs to enable use of R hand to hold and carry light ADL supplies. Baseline: Eval: R grip 4 lbs, L 50 lbs Goal status: INITIAL  4.  Pt will report <4/10 pain throughout the RUE to enable improved tolerance with using RUE during daily tasks.  Baseline: Eval: 6-7/10 pain throughout the R side (pt predominantly uses L hand for ADLs) Goal status: INITIAL  5.  Pt will increase R hand dexterity/FMC skills to manipulate clothing fasteners with bilat hands. Baseline: Eval: pt avoids clothing fasteners or manages with extra time using L hand only (R 9 hole: unable, L 24 sec) Goal status: INITIAL  ASSESSMENT:  CLINICAL IMPRESSION: Pt reporting 5/10 tingling pain (baseline) today throughout the RUE, but improved with neck stretches and moist heat applied simultaneously to R shoulder. Improved technique for AROM with use of mirror for feedback. Good use of R hand as  stabilizer to thread/unthread nuts and to remove 1" bolts from horizontal position. Pt agreeable to add neck stretches and tabletop exercises to HEP. Pt will continue to benefit from skilled OT to reduce R shoulder stiffness, improve RUE strength and coordination, and reduce pain throughout the RUE in order to maximize functional use of the RUE with daily tasks.   PERFORMANCE DEFICITS: in functional skills including ADLs, IADLs, coordination, dexterity, sensation, tone, ROM, strength, pain, flexibility, Fine motor control, Gross motor control, mobility, balance, body mechanics, decreased knowledge of use of DME, vision, and UE functional use.  IMPAIRMENTS: are limiting patient from ADLs, IADLs, rest and sleep, work, and leisure.   CO-MORBIDITIES: has co-morbidities such as CHF, depression  that affects occupational performance.  Patient will benefit from skilled OT to address above impairments and improve overall function.  MODIFICATION OR ASSISTANCE TO COMPLETE EVALUATION: No modification of tasks or assist necessary to complete an evaluation.  OT OCCUPATIONAL PROFILE AND HISTORY: Problem focused assessment: Including review of records relating to presenting problem.  CLINICAL DECISION MAKING: Moderate - several treatment options, min-mod task modification necessary  REHAB POTENTIAL: Good  EVALUATION COMPLEXITY: Moderate    PLAN:  OT FREQUENCY: 1-2x/week (pending any visit limitations with insurance), but would benefit from 2x/week  OT DURATION: 12 weeks  PLANNED INTERVENTIONS: self care/ADL training, therapeutic exercise, therapeutic activity, neuromuscular re-education, manual therapy, passive range of motion, balance training, moist heat, cryotherapy, patient/family education, coping strategies training, and DME and/or AE instructions  RECOMMENDED OTHER SERVICES: OT recommended pt follow up with her eye doctor as she does report some increased blurred vision since her CVA  CONSULTED  AND AGREED WITH PLAN OF CARE: Patient  PLAN FOR NEXT SESSION: see above  Kathie Dike, M.S. OTR/L  02/25/23, 2:34 PM  ascom [phone]    Presley Raddle, OT 02/25/2023, 2:31 PM

## 2023-02-25 NOTE — Therapy (Signed)
OUTPATIENT PHYSICAL THERAPY TREATMENT    Patient Name: Anna Tapia MRN: [id number] DOB:01-15-1991, 32 y.o., female Today's Date: 02/25/2023   PCP: Margarita Mail, DO  REFERRING PROVIDER: Margarita Mail, DO   END OF SESSION:   PT End of Session - 02/25/23 1504     Visit Number 16    Number of Visits 24    Date for PT Re-Evaluation 03/20/23    Authorization Type [place] [employer]    Authorization Time Period 12/26/22-03/20/23    Authorization - Visit Number 16    Authorization - Number of Visits 19    Progress Note Due on Visit 20    Equipment Utilized During Treatment Gait belt    Activity Tolerance Patient tolerated treatment well;No increased pain    Behavior During Therapy WFL for tasks assessed/performed                 Past Medical History:  Diagnosis Date   Asthma    Brain aneurysm    CHF (congestive heart failure)    Stroke    Past Surgical History:  Procedure Laterality Date   VENTRICULOPERITONEAL SHUNT     Patient Active Problem List   Diagnosis Date Noted   Major depressive disorder, recurrent episode, moderate 02/07/2022   Sepsis secondary to UTI 05/01/2020   VP (ventriculoperitoneal) shunt status 05/01/2020   Right ovarian cyst 05/01/2020   Elevated LFTs 05/01/2020   Vaginal spotting 05/01/2020    ONSET DATE: 07/24/2022  REFERRING DIAG: I69.30 (ICD-10-CM) - History of CVA with residual deficit  THERAPY DIAG:  Muscle weakness (generalized)  Unsteadiness on feet  Difficulty in walking, not elsewhere classified  Balance disorder  Other abnormalities of gait and mobility  Rationale for Evaluation and Treatment: Rehabilitation  SUBJECTIVE:                                                                                                                                                                                              SUBJECTIVE STATEMENT:  Pt reports doing okay today with no changes since last  session.      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  PAIN:  Are you having pain? Yes see above  RUE   PRECAUTIONS: None  WEIGHT BEARING RESTRICTIONS: No  FALLS: Has patient fallen in last 6 months? Yes. Number of falls 1, during the stroke and fell in the shower.  PATIENT GOALS: Get more mobility in the arm and less pain in the leg.  OBJECTIVE:   OBJECTIVE:  taken at eval unless otherwise stated.    DIAGNOSTIC FINDINGS:      CLINICAL DATA:  Initial evaluation for intracranial hemorrhage.   EXAM: CT ANGIOGRAPHY HEAD AND NECK    IMPRESSION: 1. 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 Again, prominent arterial contribution to the AVM is seen from the left ACA and posterior circulation, with primary venous drainage into the deep venous system. Associated 7 mm aneurysm along the right anterolateral aspect of the AVM as above. 2. Diffuse tortuosity and ectasia elsewhere about the major arterial vasculature of the head and neck. No large vessel occlusion or hemodynamically significant stenosis. No other acute vascular abnormality.   COGNITION: Overall cognitive status: Within functional limits for tasks assessed             SENSATION: WFL   COORDINATION: Pt limited with quick movements during supination/pronation of the wrist on the R side and also with thumb to finger movements.   POSTURE: rounded shoulders   LOWER  EXTREMITY ROM:      Active  Right Eval Left Eval  Hip flexion      Hip extension      Hip abduction      Hip adduction      Hip internal rotation      Hip external rotation      Knee flexion      Knee extension      Ankle dorsiflexion      Ankle plantarflexion      Ankle inversion      Ankle eversion       (Blank rows = not tested)   LOWER EXTREMITY MMT:     MMT Right Eval Left Eval  Hip flexion 4 4+  Hip abduction 4 4+  Hip adduction 4- 4-  Knee flexion 4- 4+  Knee extension 4 4+  Ankle dorsiflexion 4 4+  (Blank rows = not tested)     TRANSFERS: Assistive device utilized: None  Sit to stand: Complete Independence Stand to sit: Complete Independence Chair to chair: Complete Independence   GAIT: Gait pattern: step through pattern, decreased arm swing- Right, decreased step length- Left, decreased stance time- Right, decreased stride length, decreased hip/knee flexion- Right, decreased ankle dorsiflexion- Right, and circumduction- Right Distance walked: 40' Assistive device utilized: None Level of assistance: Complete Independence Comments: Pt able to ambulate safely without AD.   FUNCTIONAL TESTS:  5 times sit to stand: 12/26/22 15.44 sec. 01/30/23: 11.51 sec  Timed up and go (TUG): 15.65 sec 01/30/23: 10.99 sec (average of 2 trials)   6 minute walk test: 619' 2 standing rest breaks 01/30/23 924' no AD and no rest breaks  10 meter walk test: 15.46 sec 01/30/23: normal:13.06sec(.62m/s) and fast: 11.7(.71m/s) Functional gait assessment: 15/30. 01/30/23:  18/30   PATIENT SURVEYS:  FOTO 48/58/ 01/30/23: 51     TODAY'S TREATMENT:  DATE: 02/25/23 TE  Nustep BLE/BUE reciprocal movement, AAROM x 5 min level 2, cues or improved trunkal rotation and RUE elbow extension as tolerated by Pt.   NMR Resisted Gait forward, lateral (both sides), and retro  walking with 7.5# resistance   Dynamic balance training in parallel bars.  Stepping over obstacles without UE assist 2 x hurdles and 1 1/2 bolsters.    Stepping over 4" hurdle x  8 leading with RLE and x 8 leading with LLE   On airex beam :  Side stepping R and L with light BUE support. 2 x 2 bil.   Standing on airex beam shooting baskets 2 x 18 shots, good balance responses throughout.   Stepping over hurdle with target on floor to improve foot orientation with foot placement in stance phase of gait.   Unless otherwise stated, CGA was provided and gait belt donned in order to ensure pt safety    PATIENT EDUCATION: Education details: pt educated throughout session about proper posture and technique with exercises. Improved exercise technique, movement at target joints, use of target muscles after min to mod verbal, visual, tactile cues.  Person educated: Patient Education method: Explanation Education comprehension: verbalized understanding  HOME EXERCISE PROGRAM:   Access Code: [email or web address] URL: [email or web address] Date: 01/07/2023 Prepared by: Grier Rocher  Exercises - Staggered Sit-to-Stand with BUE push from RLE - 1 x daily - 7 x weekly - 3 sets - 5 reps  Access Code: [email or web address] URL: [email or web address] Date: 01/22/2023 Prepared by: Grier Rocher  Exercises - Seated Figure 4 Piriformis Stretch  - 1 x daily - 7 x weekly - 2 sets - 3 reps - 60 sec hold   PT instructed pt in HEP  Lateral lunge. X 5 bil and UE supported on rail Standing hip flexor stretch x 20 sec bil  Piriformis stretch x 1 min with increasing over pressure with trunk flexion Sit<>stand pushing from RLE x 10 .  Hip abduction x 10 BLE Hip extension x 10 BLE  Cues for posture and proper set up to reduced compensation on the RLE through trunk   GOALS: Goals reviewed with patient? Yes  SHORT TERM GOALS: Target date: 01/23/2023  Pt will be independent with HEP in order  to demonstrate increased ability to perform tasks related to occupation/hobbies. Baseline: Pt not given HEP at initial evaluation. Goal status: MET   LONG TERM GOALS: Target date: 03/20/2023  1.  Patient (> 60 years old) will complete five times sit to stand test in < 15 seconds indicating an increased LE strength and improved balance. Baseline: 15.44 sec. 3/28 11.51 sec  Goal status: MET   2.  Patient will increase FOTO score to equal to or greater than 58 to demonstrate statistically significant improvement in mobility and quality of life.  Baseline: 48 01/30/23: 51  Goal status: Progressing    3.  Patient will increase FGA score by > 4 points to demonstrate decreased fall risk during functional activities. Baseline: 15/30. 01/30/23:  18/30 Goal status: Progressing    4.  Patient will reduce timed up and go to <11 seconds to reduce fall risk and demonstrate improved transfer/gait ability. Baseline: 15.65 sec. 01/30/23: 10.99 sec  Goal status:  MET   5.  Patient will increase 10 meter walk test to >1.19m/s as to improve gait speed for better community ambulation and to reduce fall risk. Baseline: 15.46 sec. 01/30/23: normal:13.06sec(.15m/s) Goal status: progressing   6.  Patient will increase six minute walk test distance  to >1000 for progression to community ambulator and improve gait ability Baseline: 3' with 2 standing rest breaks, no AD. 01/30/23 924' no AD and no rest breaks  Goal status: progressing     ASSESSMENT:  CLINICAL IMPRESSION:  Pt continues to put forth good effort in PT sessions. Pt treatment focused on high level balance training. Pt has difficulty with R ankle control with higher level balance activities but is able to better control it with cues. Pt challenged with lateral stepping on airex beam and has difficulty with R LE awareness nearly stepping off beam without cues. Pt will continue to benefit from skilled physical therapy intervention to address  impairments, improve QOL, and attain therapy goals.      OBJECTIVE IMPAIRMENTS: Abnormal gait, decreased activity tolerance, decreased balance, decreased endurance, decreased mobility, difficulty walking, decreased ROM, decreased strength, and pain.   ACTIVITY LIMITATIONS: carrying, lifting, bending, standing, squatting, sleeping, bathing, toileting, reach over head, hygiene/grooming, and locomotion level  PARTICIPATION LIMITATIONS: meal prep, cleaning, laundry, driving, shopping, community activity, and yard work  PERSONAL FACTORS: Age, Past/current experiences, Social background, Time since onset of injury/illness/exacerbation, Transportation, and 3+ comorbidities: asthma, depression, CHF  are also affecting patient's functional outcome.   REHAB POTENTIAL: Good  CLINICAL DECISION MAKING: Stable/uncomplicated  EVALUATION COMPLEXITY: Moderate  PLAN:  PT FREQUENCY: 1-2x/week, preferably 2x/week but due to insurance, may only be able to be seen 1x/week  PT DURATION: 12 weeks  PLANNED INTERVENTIONS: Therapeutic exercises, Therapeutic activity, Neuromuscular re-education, Balance training, Gait training, Patient/Family education, Self Care, Joint mobilization, Joint manipulation, Stair training, Vestibular training, Canalith repositioning, Orthotic/Fit training, DME instructions, Aquatic Therapy, Dry Needling, Electrical stimulation, Spinal manipulation, Spinal mobilization, Cryotherapy, Moist heat, Taping, Manual therapy, and Re-evaluation   PLAN FOR NEXT SESSION:   High intensity gait and balance training. Blaze pods and airex pad.  Endurance training.   Norman Herrlich PT ,DPT Physical Therapist- [place]  [facility]   3:06 PM 02/25/23

## 2023-02-27 ENCOUNTER — Ambulatory Visit: Payer: Medicaid Other | Admitting: Physical Therapy

## 2023-03-04 ENCOUNTER — Ambulatory Visit: Payer: Medicaid Other

## 2023-03-04 ENCOUNTER — Ambulatory Visit: Payer: Medicaid Other | Admitting: Physical Therapy

## 2023-03-04 DIAGNOSIS — R278 Other lack of coordination: Secondary | ICD-10-CM | POA: Diagnosis not present

## 2023-03-04 DIAGNOSIS — R2681 Unsteadiness on feet: Secondary | ICD-10-CM

## 2023-03-04 DIAGNOSIS — R2689 Other abnormalities of gait and mobility: Secondary | ICD-10-CM

## 2023-03-04 DIAGNOSIS — Z7689 Persons encountering health services in other specified circumstances: Secondary | ICD-10-CM | POA: Diagnosis not present

## 2023-03-04 DIAGNOSIS — M6281 Muscle weakness (generalized): Secondary | ICD-10-CM | POA: Diagnosis not present

## 2023-03-04 DIAGNOSIS — R262 Difficulty in walking, not elsewhere classified: Secondary | ICD-10-CM

## 2023-03-04 NOTE — Therapy (Signed)
OUTPATIENT PHYSICAL THERAPY TREATMENT    Patient Name: Anna Tapia MRN: [id number] DOB:29-Jul-1991, 32 y.o., female Today's Date: 03/04/2023   PCP: Margarita Mail, DO  REFERRING PROVIDER: Margarita Mail, DO   END OF SESSION:   PT End of Session - 03/04/23 1350     Visit Number 17    Number of Visits 24    Date for PT Re-Evaluation 03/20/23    Authorization Type [place] [employer]ICA[employer]PAID [employer] PLAN    Authorization Time Period 12/26/22-03/20/23    Authorization - Number of Visits 19    Progress Note Due on Visit 20    PT Start Time 1349    PT Stop Time 1431    PT Time Calculation (min) 42 min    Equipment Utilized During Treatment Gait belt    Activity Tolerance Patient tolerated treatment well;No increased pain    Behavior During Therapy WFL for tasks assessed/performed                 Past Medical History:  Diagnosis Date   Asthma    Brain aneurysm    CHF (congestive heart failure) (HCC)    Stroke [facility])    Past Surgical History:  Procedure Laterality Date   VENTRICULOPERITONEAL SHUNT     Patient Active Problem List   Diagnosis Date Noted   Major depressive disorder, recurrent episode, moderate (HCC) 02/07/2022   Sepsis secondary to UTI (HCC) 05/01/2020   VP (ventriculoperitoneal) shunt status 05/01/2020   Right ovarian cyst 05/01/2020   Elevated LFTs 05/01/2020   Vaginal spotting 05/01/2020    ONSET DATE: 07/24/2022  REFERRING DIAG: I69.30 (ICD-10-CM) - History of CVA with residual deficit  THERAPY DIAG:  Muscle weakness (generalized)  Other lack of coordination  Unsteadiness on feet  Difficulty in walking, not elsewhere classified  Balance disorder  Other abnormalities of gait and mobility  Rationale for Evaluation and Treatment: Rehabilitation  SUBJECTIVE:                                                                                                                                                                                               SUBJECTIVE STATEMENT:  Pt reports doing okay today with no changes since last session. Continues to have arm pain on the R side. Reports that she is able to continue to use the R arm more functionally with daily tasks though.        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  PAIN:  Are you having pain? Yes see above  RUE   PRECAUTIONS: None  WEIGHT BEARING RESTRICTIONS: No  FALLS: Has patient fallen in last 6 months? Yes. Number of falls 1, during the stroke and fell in the shower.  PATIENT GOALS: Get more mobility in the arm and less pain in the leg.  OBJECTIVE:   OBJECTIVE:  taken at eval unless otherwise stated.    DIAGNOSTIC FINDINGS:      CLINICAL DATA:  Initial evaluation for intracranial hemorrhage.   EXAM: CT ANGIOGRAPHY HEAD AND NECK    IMPRESSION: 1. 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 Again, prominent arterial contribution to the AVM is seen from the left ACA and posterior circulation, with primary venous drainage into the deep venous system. Associated 7 mm aneurysm along the right anterolateral aspect of the AVM as above. 2. Diffuse tortuosity and ectasia elsewhere about the major arterial vasculature of the head and neck. No large vessel occlusion or hemodynamically significant stenosis. No other acute vascular abnormality.   COGNITION: Overall cognitive status: Within functional limits  for tasks assessed             SENSATION: WFL   COORDINATION: Pt limited with quick movements during supination/pronation of the wrist on the R side and also with thumb to finger movements.   POSTURE: rounded shoulders   LOWER EXTREMITY ROM:      Active  Right Eval Left Eval  Hip flexion      Hip extension      Hip abduction      Hip adduction      Hip internal rotation      Hip external rotation      Knee flexion      Knee extension      Ankle dorsiflexion      Ankle plantarflexion      Ankle inversion      Ankle eversion       (Blank rows = not tested)   LOWER EXTREMITY MMT:     MMT Right Eval Left Eval  Hip flexion 4 4+  Hip abduction 4 4+  Hip adduction 4- 4-  Knee flexion 4- 4+  Knee extension 4 4+  Ankle dorsiflexion 4 4+  (Blank rows = not tested)     TRANSFERS: Assistive device utilized: None  Sit to stand: Complete Independence Stand to sit: Complete Independence Chair to chair: Complete Independence   GAIT: Gait pattern: step through pattern, decreased arm swing- Right, decreased step length- Left, decreased stance time- Right, decreased stride length, decreased hip/knee flexion- Right, decreased ankle dorsiflexion- Right, and circumduction- Right Distance walked: 40' Assistive device utilized: None Level of assistance: Complete Independence Comments: Pt able to ambulate safely without AD.   FUNCTIONAL TESTS:  5 times sit to stand: 12/26/22 15.44 sec. 01/30/23: 11.51 sec  Timed up and go (TUG): 15.65 sec 01/30/23: 10.99 sec (average of 2 trials)   6 minute walk test: 619' 2 standing rest breaks 01/30/23 924' no AD and no rest breaks  10 meter walk test: 15.46 sec 01/30/23: normal:13.06sec(.14m/s) and fast: 11.7(.73m/s) Functional gait  assessment: 15/30. 01/30/23:  18/30   PATIENT SURVEYS:  FOTO 48/58/ 01/30/23: 51     TODAY'S TREATMENT:                                                                                                                               DATE: 03/04/23  Dynamic NMR through Gait training without UE support through hall 2 x 130ft. Then performed over cement sidewalk at healing garden and through grass 2 x >580ft with ascent and descent of ramp to and from garden. Multiple mild LOB, but able to correct without assist from PT with use of stepping strategy in grass.   Dynamic balance training. Stepping over bolsters on floor: forwards x 4 and lateral x 2 bil.  Reverse 2 x 2 with min assist progressing to CGA from PT. Cues for weight shift over the RLE prior to advancement of the LLE intermittently primarily in retroversion.   CGA-supervision assist provided by PT throughout session unless otherwise stated.       PATIENT EDUCATION: Education details: pt educated throughout session about proper posture and technique with exercises. Improved exercise technique, movement at target joints, use of target muscles after min to mod verbal, visual, tactile cues.  Person educated: Patient Education method: Explanation Education comprehension: verbalized understanding  HOME EXERCISE PROGRAM:   Access Code: [email or web address] URL: [email or web address] Date: 01/07/2023 Prepared by: Grier Rocher  Exercises - Staggered Sit-to-Stand with BUE push from RLE - 1 x daily - 7 x weekly - 3 sets - 5 reps  Access Code: [email or web address] URL: [email or web address] Date: 01/22/2023 Prepared by: Grier Rocher  Exercises - Seated Figure 4 Piriformis Stretch  - 1 x daily - 7 x weekly - 2 sets - 3 reps - 60 sec hold   PT instructed pt in HEP  Lateral lunge. X 5 bil and UE supported on rail Standing hip flexor stretch x 20 sec bil  Piriformis stretch x 1 min with increasing over pressure with trunk flexion Sit<>stand pushing from RLE x 10 .  Hip abduction x 10 BLE Hip extension x 10 BLE  Cues for posture and proper set up to reduced compensation on the RLE through trunk   GOALS: Goals reviewed with patient?  Yes  SHORT TERM GOALS: Target date: 01/23/2023  Pt will be independent with HEP in order to demonstrate increased ability to perform tasks related to occupation/hobbies. Baseline: Pt not given HEP at initial evaluation. Goal status: MET   LONG TERM GOALS: Target date: 03/20/2023  1.  Patient (> 9 years old) will complete five times sit to stand test in < 15 seconds indicating an increased LE strength and improved balance. Baseline: 15.44 sec. 3/28 11.51 sec  Goal status: MET   2.  Patient will increase FOTO score to equal to or greater than 58 to demonstrate statistically significant improvement in mobility and quality of life.  Baseline: 48 01/30/23: 51  Goal status: Progressing    3.  Patient will increase FGA score by > 4 points to demonstrate decreased fall risk during functional activities. Baseline: 15/30. 01/30/23:  18/30 Goal status: Progressing    4.  Patient will reduce timed up and go to <11 seconds to reduce fall risk and demonstrate improved transfer/gait ability. Baseline: 15.65 sec. 01/30/23: 10.99 sec  Goal status:  MET   5.  Patient will increase 10 meter walk test to >1.67m/s as to improve gait speed for better community ambulation and to reduce fall risk. Baseline: 15.46 sec. 01/30/23: normal:13.06sec(.15m/s) Goal status: progressing   6.  Patient will increase six minute walk test distance to >1000 for progression to community ambulator and improve gait ability Baseline: 41' with 2 standing rest breaks, no AD. 01/30/23 924' no AD and no rest breaks  Goal status: progressing     ASSESSMENT:  CLINICAL IMPRESSION:  Pt continues to put forth good effort in PT sessions. PT treatment focused on dynamic and simulated gait and balance training to allow increased safety and autonomy in daily tasks and real world situations. Pt able to correct most LOB except in retroversion stepping backward over obstacles. Pt will continue to benefit from skilled physical therapy  intervention to address impairments, improve QOL, and attain therapy goals.      OBJECTIVE IMPAIRMENTS: Abnormal gait, decreased activity tolerance, decreased balance, decreased endurance, decreased mobility, difficulty walking, decreased ROM, decreased strength, and pain.   ACTIVITY LIMITATIONS: carrying, lifting, bending, standing, squatting, sleeping, bathing, toileting, reach over head, hygiene/grooming, and locomotion level  PARTICIPATION LIMITATIONS: meal prep, cleaning, laundry, driving, shopping, community activity, and yard work  PERSONAL FACTORS: Age, Past/current experiences, Social background, Time since onset of injury/illness/exacerbation, Transportation, and 3+ comorbidities: asthma, depression, CHF  are also affecting patient's functional outcome.   REHAB POTENTIAL: Good  CLINICAL DECISION MAKING: Stable/uncomplicated  EVALUATION COMPLEXITY: Moderate  PLAN:  PT FREQUENCY: 1-2x/week, preferably 2x/week but due to insurance, may only be able to be seen 1x/week  PT DURATION: 12 weeks  PLANNED INTERVENTIONS: Therapeutic exercises, Therapeutic activity, Neuromuscular re-education, Balance training, Gait training, Patient/Family education, Self Care, Joint mobilization, Joint manipulation, Stair training, Vestibular training, Canalith repositioning, Orthotic/Fit training, DME instructions, Aquatic Therapy, Dry Needling, Electrical stimulation, Spinal manipulation, Spinal mobilization, Cryotherapy, Moist heat, Taping, Manual therapy, and Re-evaluation   PLAN FOR NEXT SESSION:   High intensity gait and balance training. Blaze pods and airex pad.  Endurance training.   Golden Pop PT ,DPT Physical Therapist- [place]  [facility]   2:47 PM 03/04/23

## 2023-03-04 NOTE — Therapy (Signed)
OUTPATIENT OCCUPATIONAL THERAPY NEURO TREATMENT NOTE  Patient Name: Anna Tapia MRN: [id number] DOB:[birth year], 32 y.o., female Today's Date: 03/04/2023  PCP: Dr. Margarita Mail REFERRING PROVIDER: Dr. Margarita Mail  END OF SESSION:  OT End of Session - 03/04/23 1604     Visit Number 7    Number of Visits 24    Date for OT Re-Evaluation 04/09/23    Progress Note Due on Visit 10    OT Start Time 1313    OT Stop Time 1345    OT Time Calculation (min) 32 min    Activity Tolerance Patient tolerated treatment well    Behavior During Therapy [facility] for tasks assessed/performed              Past Medical History:  Diagnosis Date   Asthma    Brain aneurysm    CHF (congestive heart failure) (HCC)    Stroke [facility])    Past Surgical History:  Procedure Laterality Date   VENTRICULOPERITONEAL SHUNT     Patient Active Problem List   Diagnosis Date Noted   Major depressive disorder, recurrent episode, moderate (HCC) 02/07/2022   Sepsis secondary to UTI (HCC) 05/01/2020   VP (ventriculoperitoneal) shunt status 05/01/2020   Right ovarian cyst 05/01/2020   Elevated LFTs 05/01/2020   Vaginal spotting 05/01/2020   ONSET DATE: Sept 2023;   REFERRING DIAG:  I63.9 (ICD-10-CM) - CVA (cerebral vascular accident) (HCC)   THERAPY DIAG:  Muscle weakness (generalized)  Other lack of coordination  Rationale for Evaluation and Treatment: Rehabilitation  SUBJECTIVE:  SUBJECTIVE STATEMENT: Visit shortened as pt arrived late today.  Pt stated, "[employer] made me late."  Pt accompanied by: self  PERTINENT HISTORY: Per Dr. Caralee Ates on 11/19/22: HPI [name] presents to establish care.  She is here with her mom.   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       PRECAUTIONS: None  WEIGHT BEARING RESTRICTIONS: No  PAIN:  Are you having pain? Yes: NPRS scale: 5/10 Pain location: Entire R side, arm and leg Pain description: pins and needles in the hand, arm is achy, moving the arm causes stabbing pain Aggravating factors: movement of the arm Relieving factors: rest; pt reports medication is minimally effective  FALLS: Has patient fallen in last 6 months? Yes. 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  Being independent."  OBJECTIVE:   HAND DOMINANCE: Left  ADLs: Overall ADLs: mostly performed with the LUE Transfers/ambulation related to ADLs: indep/extra time Eating: cuts food 1 handed Grooming: 1 handed (L) UB Dressing: occasional help to don clothes if tired after  a shower; help with straightening clothes; struggles with clothing fasteners LB Dressing: wearing slip on shoes; difficulty with clothing fasteners  Toileting: indep Bathing: modified indep; pt makes attempts to engage the R arm while bathing but reports this is difficult Tub Shower transfers: modified indep Equipment:  see above  IADLs: Shopping: pt can go shopping accompanied by a family member Light housekeeping: 1 handed Meal Prep: cooking causes fatigue; mostly L handed Community mobility: uses  rollator for community mobility; uses [employer] transportation Medication management: uses weekly pill organizer; Engineer, materials: mother manages (pt states she doesn't really have any bills)  Handwriting:  N/A (pt is L hand dominant)  MOBILITY STATUS:  ambulatory without AD; pt seeing PT for LE strengthening and balance deficits  POSTURE COMMENTS:  rounded shoulders Sitting balance: Moves/returns truncal midpoint >2 inches in all planes  ACTIVITY TOLERANCE: Activity tolerance: Pain in the R side significantly limits activity tolerance.  Pt reports she can get fatigued with ADLs and IADLs.  FUNCTIONAL OUTCOME MEASURES: FOTO: 44; 48  UPPER EXTREMITY ROM:    Active ROM Right eval Left Eval WNL  Shoulder flexion 38 (60)   Shoulder abduction    Shoulder adduction 42 (51)   Shoulder extension    Shoulder internal rotation    Shoulder external rotation Hand to back of head (scaption and chin tuck)   Elbow flexion WNL   Elbow extension WNL   Wrist flexion WNL   Wrist extension WNL   Wrist ulnar deviation    Wrist radial deviation    Wrist pronation WNL   Wrist supination WNL   (Blank rows = not tested)  R hand: Able to oppose each digit to thumb with extra time and able to make full composite fist  UPPER EXTREMITY MMT:     MMT Right eval Left Eval 5/5  Shoulder flexion 3-   Shoulder abduction 3-   Shoulder adduction    Shoulder extension    Shoulder internal rotation 3+   Shoulder external rotation 3+   Middle trapezius    Lower trapezius    Elbow flexion 4-   Elbow extension 4   Wrist flexion 4-   Wrist extension 3+   Wrist ulnar deviation    Wrist radial deviation    Wrist pronation    Wrist supination    (Blank rows = not tested)  HAND FUNCTION: Grip strength: Right: 4 lbs; Left: 50 lbs, Lateral pinch: Right: 0 lbs, Left: 17 lbs, and 3 point pinch: Right: 0 lbs, Left: 19 lbs  COORDINATION: Finger Nose Finger test: able to touch nose with  increased time 9 Hole Peg test: Right: unable (picked up a peg with repeat trials but unable to move the peg from horizontal to vertical position to place into board) sec; Left: 24 sec  SENSATION: tingling in the R hand  Light touch: Impaired  Proprioception: WFL  EDEMA: N/A  MUSCLE TONE: RUE: Mild and Hypertonic  COGNITION: Overall cognitive status: Within functional limits for tasks assessed  VISION: Subjective report: Pt reports increased blurred vision following her stroke Baseline vision: Wears glasses all the time  VISION ASSESSMENT: Ocular ROM: WFL Gaze preference/alignment: gaze left; slight Tracking/Visual pursuits: Able to track stimulus in all quads without difficulty Saccades: WFL Visual Fields: no apparent deficits  PERCEPTION: WFL  PRAXIS: Impaired: Motor planning  OBSERVATIONS: Pt guards RUE with flexed elbow and shoulder IR/hand to abdomen during mobility.  R scapular elevation noting increased tension in R upper trap.  TODAY'S TREATMENT:  Therapeutic Exercise: Performed passive stretching throughout the RUE, including all planes for R shoulder in prep for functional reaching activities.   Participated in forward and lateral reaching patterns with the RUE moving rings from between the 1st, 2nd, and 3rd levels of the Abercrombie tower.  Pt required min A to reach 3rd level.  Intermittent tactile cues to facilitate elbow ext and shoulder flex and abd while reaching and to prevent forward leaning.  Facilitated R forearm, wrist, and hand strengthening with participation in EZ board tools.  Pt worked with long handled tool to facilitate R wrist flex/ext, large base key turn, large dial turn, and small dial to facilitate R hand digit flex/ext x2 reps for each tool (up/down board=1 rep).  Rest breaks between sets and min A to maintain tools level on velcro  strip.   Neuro re-ed: Facilitated R hand [facility] skills working to flip 10 circular wooden blocks on table top x2 trials.  Pt then practiced stacking 2 blocks on top of each other, removing blocks, then moving blocks from L to R side of table to promote functional reaching and grasp/release skills.  Min tactile cues to promote elbow extension and to prevent forward leaning while reaching.  PATIENT EDUCATION: Education details: Forward and lateral reaching patterns Person educated: Patient Education method: Explanation and Verbal cues, demo, tactile cues Education comprehension: able to return demo.   HOME EXERCISE PROGRAM: Soft blue theraputty exercises  GOALS: Goals reviewed with patient? Yes  SHORT TERM GOALS: Target date: 02/26/23 (6 weeks)  Pt will be indep to perform HEP for increasing RUE flexibility, strength, and coordination to better engage the RUE into daily tasks. Baseline: Not yet initiated Goal status: INITIAL  LONG TERM GOALS: Target date: 04/09/23 (12 weeks)  Pt will increase FOTO score to 48 or better to indicate clinically relevant improvement in self perceived use of R arm for daily tasks.  Baseline: Eval: 44  Goal status: INITIAL  2.  Pt will increase R active shoulder flexion to 100* or better to achieve functional ROM for UB ADLs and reaching for ADL supplies. Baseline: Eval: R 38 degrees (passive 60); pt reaches during ADLs only with the L arm Goal status: INITIAL  3.  Pt will increase R grip strength by 10 or more lbs to enable use of R hand to hold and carry light ADL supplies. Baseline: Eval: R grip 4 lbs, L 50 lbs Goal status: INITIAL  4.  Pt will report <4/10 pain throughout the RUE to enable improved tolerance with using RUE during daily tasks.  Baseline: Eval: 6-7/10 pain throughout the R side (pt predominantly uses L hand for ADLs) Goal status: INITIAL  5.  Pt will increase R hand dexterity/FMC skills to manipulate clothing fasteners with bilat  hands. Baseline: Eval: pt avoids clothing fasteners or manages with extra time using L hand only (R 9 hole: unable, L 24 sec) Goal status: INITIAL  ASSESSMENT:  CLINICAL IMPRESSION: Visit shortened as pt arrived late today.  Pt stated, "[employer] made me late."   Pt tolerated all therapeutic exercises well this date.  Tx focussed on grasp/release skills and forward and lateral reaching patterns with the RUE.  Pt requires intermittent tactile cues to facilitate elbow ext and shoulder flex and abd while reaching and to prevent forward leaning.  Pt still has 5/10 pain in the RUE, but this has improved from 6-7/10 pain throughout the RUE at eval.  Pt will continue to benefit from skilled OT to reduce R shoulder  stiffness, improve RUE strength and coordination, and reduce pain throughout the RUE in order to maximize functional use of the RUE with daily tasks.   PERFORMANCE DEFICITS: in functional skills including ADLs, IADLs, coordination, dexterity, sensation, tone, ROM, strength, pain, flexibility, Fine motor control, Gross motor control, mobility, balance, body mechanics, decreased knowledge of use of DME, vision, and UE functional use.  IMPAIRMENTS: are limiting patient from ADLs, IADLs, rest and sleep, work, and leisure.   CO-MORBIDITIES: has co-morbidities such as CHF, depression  that affects occupational performance. Patient will benefit from skilled OT to address above impairments and improve overall function.  MODIFICATION OR ASSISTANCE TO COMPLETE EVALUATION: No modification of tasks or assist necessary to complete an evaluation.  OT OCCUPATIONAL PROFILE AND HISTORY: Problem focused assessment: Including review of records relating to presenting problem.  CLINICAL DECISION MAKING: Moderate - several treatment options, min-mod task modification necessary  REHAB POTENTIAL: Good  EVALUATION COMPLEXITY: Moderate    PLAN:  OT FREQUENCY: 1-2x/week (pending any visit limitations with  insurance), but would benefit from 2x/week  OT DURATION: 12 weeks  PLANNED INTERVENTIONS: self care/ADL training, therapeutic exercise, therapeutic activity, neuromuscular re-education, manual therapy, passive range of motion, balance training, moist heat, cryotherapy, patient/family education, coping strategies training, and DME and/or AE instructions  RECOMMENDED OTHER SERVICES: OT recommended pt follow up with her eye doctor as she does report some increased blurred vision since her CVA  CONSULTED AND AGREED WITH PLAN OF CARE: Patient  PLAN FOR NEXT SESSION: see above  Danelle Earthly, MS, OTR/L  Otis Dials, OT 03/04/2023, 4:06 PM

## 2023-03-05 DIAGNOSIS — Z419 Encounter for procedure for purposes other than remedying health state, unspecified: Secondary | ICD-10-CM | POA: Diagnosis not present

## 2023-03-06 ENCOUNTER — Ambulatory Visit: Payer: Medicaid Other | Admitting: Physical Therapy

## 2023-03-06 ENCOUNTER — Ambulatory Visit: Payer: Medicaid Other | Attending: Internal Medicine

## 2023-03-06 DIAGNOSIS — R2681 Unsteadiness on feet: Secondary | ICD-10-CM | POA: Diagnosis not present

## 2023-03-06 DIAGNOSIS — M6281 Muscle weakness (generalized): Secondary | ICD-10-CM | POA: Diagnosis not present

## 2023-03-06 DIAGNOSIS — R278 Other lack of coordination: Secondary | ICD-10-CM

## 2023-03-06 DIAGNOSIS — R262 Difficulty in walking, not elsewhere classified: Secondary | ICD-10-CM | POA: Insufficient documentation

## 2023-03-06 DIAGNOSIS — Z7689 Persons encountering health services in other specified circumstances: Secondary | ICD-10-CM | POA: Diagnosis not present

## 2023-03-06 DIAGNOSIS — R2689 Other abnormalities of gait and mobility: Secondary | ICD-10-CM | POA: Insufficient documentation

## 2023-03-07 NOTE — Therapy (Signed)
OUTPATIENT OCCUPATIONAL THERAPY NEURO TREATMENT NOTE  Patient Name: Anna Tapia MRN: [id number] DOB:[birth year], 32 y.o., female Today's Date: 03/07/2023  PCP: Dr. Margarita Mail REFERRING PROVIDER: Dr. Margarita Mail  END OF SESSION:  OT End of Session - 03/07/23 1002     Visit Number 8    Number of Visits 24    Date for OT Re-Evaluation 04/09/23    Progress Note Due on Visit 10    OT Start Time 1430    OT Stop Time 1515    OT Time Calculation (min) 45 min    Activity Tolerance Patient tolerated treatment well    Behavior During Therapy [facility] for tasks assessed/performed            Past Medical History:  Diagnosis Date   Asthma    Brain aneurysm    CHF (congestive heart failure) (HCC)    Stroke [facility])    Past Surgical History:  Procedure Laterality Date   VENTRICULOPERITONEAL SHUNT     Patient Active Problem List   Diagnosis Date Noted   Major depressive disorder, recurrent episode, moderate (HCC) 02/07/2022   Sepsis secondary to UTI (HCC) 05/01/2020   VP (ventriculoperitoneal) shunt status 05/01/2020   Right ovarian cyst 05/01/2020   Elevated LFTs 05/01/2020   Vaginal spotting 05/01/2020   ONSET DATE: Sept 2023;   REFERRING DIAG:  I63.9 (ICD-10-CM) - CVA (cerebral vascular accident) (HCC)   THERAPY DIAG:  Muscle weakness (generalized)  Other lack of coordination  Rationale for Evaluation and Treatment: Rehabilitation  SUBJECTIVE:  SUBJECTIVE STATEMENT: Pt reports doing well today. Pt accompanied by: self  PERTINENT HISTORY: Per Dr. Caralee Ates on 11/19/22: HPI [name] presents to establish care.  She is here with her mom.   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       PRECAUTIONS: None  WEIGHT BEARING RESTRICTIONS: No  PAIN:  Are you having pain? Yes: NPRS scale: 5/10 Pain location: Entire R side, arm and leg Pain description: pins and needles in the hand, arm is achy, moving the arm causes stabbing pain Aggravating factors: movement of the arm Relieving factors: rest; pt reports medication is minimally effective  FALLS: Has patient fallen in last 6 months? Yes. 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  Being independent."  OBJECTIVE:   HAND DOMINANCE: Left  ADLs: Overall ADLs: mostly performed with the LUE Transfers/ambulation related to ADLs: indep/extra time Eating: cuts food 1 handed Grooming: 1 handed (L) UB Dressing: occasional help to don clothes if tired after a shower; help with straightening clothes; struggles with clothing fasteners LB Dressing:  wearing slip on shoes; difficulty with clothing fasteners  Toileting: indep Bathing: modified indep; pt makes attempts to engage the R arm while bathing but reports this is difficult Tub Shower transfers: modified indep Equipment:  see above  IADLs: Shopping: pt can go shopping accompanied by a family member Light housekeeping: 1 handed Meal Prep: cooking causes fatigue; mostly L handed Community mobility: uses rollator for community mobility; uses [employer]  transportation Medication management: uses weekly pill organizer; Engineer, materials: mother manages (pt states she doesn't really have any bills)  Handwriting:  N/A (pt is L hand dominant)  MOBILITY STATUS:  ambulatory without AD; pt seeing PT for LE strengthening and balance deficits  POSTURE COMMENTS:  rounded shoulders Sitting balance: Moves/returns truncal midpoint >2 inches in all planes  ACTIVITY TOLERANCE: Activity tolerance: Pain in the R side significantly limits activity tolerance.  Pt reports she can get fatigued with ADLs and IADLs.  FUNCTIONAL OUTCOME MEASURES: FOTO: 44; 48  UPPER EXTREMITY ROM:    Active ROM Right eval Left Eval WNL  Shoulder flexion 38 (60)   Shoulder abduction    Shoulder adduction 42 (51)   Shoulder extension    Shoulder internal rotation    Shoulder external rotation Hand to back of head (scaption and chin tuck)   Elbow flexion WNL   Elbow extension WNL   Wrist flexion WNL   Wrist extension WNL   Wrist ulnar deviation    Wrist radial deviation    Wrist pronation WNL   Wrist supination WNL   (Blank rows = not tested)  R hand: Able to oppose each digit to thumb with extra time and able to make full composite fist  UPPER EXTREMITY MMT:     MMT Right eval Left Eval 5/5  Shoulder flexion 3-   Shoulder abduction 3-   Shoulder adduction    Shoulder extension    Shoulder internal rotation 3+   Shoulder external rotation 3+   Middle trapezius    Lower trapezius    Elbow flexion 4-   Elbow extension 4   Wrist flexion 4-   Wrist extension 3+   Wrist ulnar deviation    Wrist radial deviation    Wrist pronation    Wrist supination    (Blank rows = not tested)  HAND FUNCTION: Grip strength: Right: 4 lbs; Left: 50 lbs, Lateral pinch: Right: 0 lbs, Left: 17 lbs, and 3 point pinch: Right: 0 lbs, Left: 19 lbs  COORDINATION: Finger Nose Finger test: able to touch nose with increased time 9 Hole Peg test: Right: unable  (picked up a peg with repeat trials but unable to move the peg from horizontal to vertical position to place into board) sec; Left: 24 sec  SENSATION: tingling in the R hand  Light touch: Impaired  Proprioception: WFL  EDEMA: N/A  MUSCLE TONE: RUE: Mild and Hypertonic  COGNITION: Overall cognitive status: Within functional limits for tasks assessed  VISION: Subjective report: Pt reports increased blurred vision following her stroke Baseline vision: Wears glasses all the time  VISION ASSESSMENT: Ocular ROM: WFL Gaze preference/alignment: gaze left; slight Tracking/Visual pursuits: Able to track stimulus in all quads without difficulty Saccades: WFL Visual Fields: no apparent deficits  PERCEPTION: WFL  PRAXIS: Impaired: Motor planning  OBSERVATIONS: Pt guards RUE with flexed elbow and shoulder IR/hand to abdomen during mobility.  R scapular elevation noting increased tension in R upper trap.  TODAY'S TREATMENT:  Therapeutic Exercise: Performed passive stretching throughout the RUE, including all planes for R shoulder in prep for neuro re-ed activities.    Neuro re-ed: Facilitated R hand FMC/dexterity skills and forward and lateral reaching patterns working to place, flip, and remove [place] discs from grid.  Provided elevated surface for 1 round to place and remove discs between table top and 6" elevated surface.  Pt required occasional rest breaks and min tactile cues to promote optimal form when reaching.  Pt primarily worked to manipulate discs with the R thumb and IF, but also practiced reps to engage LF with IF and thumb.  Practiced additional FMC/dexterity skills with the R hand, working to pick up small glass stones from table top.  Practiced reps initially with a washcloth for a non-skid surface, and able to progress to table top without non-skid  surface, working to pick up stones from flat side up and flat side down.  Pt practiced 2 and 3 point pinch patterns to pick up stones, practiced scooping multiple stones into palm, and storing up to 5 in hand.  PATIENT EDUCATION: Education details: Forward and lateral reaching patterns, FMC/dexterity skills Person educated: Patient Education method: Explanation and Verbal cues, demo, tactile cues Education comprehension: able to return demo.   HOME EXERCISE PROGRAM: Soft blue theraputty exercises  GOALS: Goals reviewed with patient? Yes  SHORT TERM GOALS: Target date: 02/26/23 (6 weeks)  Pt will be indep to perform HEP for increasing RUE flexibility, strength, and coordination to better engage the RUE into daily tasks. Baseline: Not yet initiated Goal status: INITIAL  LONG TERM GOALS: Target date: 04/09/23 (12 weeks)  Pt will increase FOTO score to 48 or better to indicate clinically relevant improvement in self perceived use of R arm for daily tasks.  Baseline: Eval: 44  Goal status: INITIAL  2.  Pt will increase R active shoulder flexion to 100* or better to achieve functional ROM for UB ADLs and reaching for ADL supplies. Baseline: Eval: R 38 degrees (passive 60); pt reaches during ADLs only with the L arm Goal status: INITIAL  3.  Pt will increase R grip strength by 10 or more lbs to enable use of R hand to hold and carry light ADL supplies. Baseline: Eval: R grip 4 lbs, L 50 lbs Goal status: INITIAL  4.  Pt will report <4/10 pain throughout the RUE to enable improved tolerance with using RUE during daily tasks.  Baseline: Eval: 6-7/10 pain throughout the R side (pt predominantly uses L hand for ADLs) Goal status: INITIAL  5.  Pt will increase R hand dexterity/FMC skills to manipulate clothing fasteners with bilat hands. Baseline: Eval: pt avoids clothing fasteners or manages with extra time using L hand only (R 9 hole: unable, L 24 sec) Goal status:  INITIAL  ASSESSMENT:  CLINICAL IMPRESSION: Noted slight increase in spasticity throughout the RUE today, but responded well to moist heat and passive stretching throughout the RUE for tone reduction in prep for neuro re-ed activities.  Pt practiced forward and lateral reaching patterns working to [employer] discs.  Pt verbalizes significant effort to manipulate discs in combination with forward and lateral reaching patterns, but performed well with rest breaks and increased time.  Pt improving with her ability to engage the R LF into a 3 point pinch pattern.  Pt typically manipulates items with a tip pinch, but when cued, is demonstrating less slipping of LF with her attempts at a 3 point pinch.  Pt was able to  pick up glass stones today from table top without a non-skid surface and flat side down, and was able to store up to 5 stones in palm with repeated attempts.  Pt will continue to benefit from skilled OT to reduce R shoulder stiffness, improve RUE strength and coordination, and reduce pain throughout the RUE in order to maximize functional use of the RUE with daily tasks.   PERFORMANCE DEFICITS: in functional skills including ADLs, IADLs, coordination, dexterity, sensation, tone, ROM, strength, pain, flexibility, Fine motor control, Gross motor control, mobility, balance, body mechanics, decreased knowledge of use of DME, vision, and UE functional use.  IMPAIRMENTS: are limiting patient from ADLs, IADLs, rest and sleep, work, and leisure.   CO-MORBIDITIES: has co-morbidities such as CHF, depression  that affects occupational performance. Patient will benefit from skilled OT to address above impairments and improve overall function.  MODIFICATION OR ASSISTANCE TO COMPLETE EVALUATION: No modification of tasks or assist necessary to complete an evaluation.  OT OCCUPATIONAL PROFILE AND HISTORY: Problem focused assessment: Including review of records relating to presenting  problem.  CLINICAL DECISION MAKING: Moderate - several treatment options, min-mod task modification necessary  REHAB POTENTIAL: Good  EVALUATION COMPLEXITY: Moderate    PLAN:  OT FREQUENCY: 1-2x/week (pending any visit limitations with insurance), but would benefit from 2x/week  OT DURATION: 12 weeks  PLANNED INTERVENTIONS: self care/ADL training, therapeutic exercise, therapeutic activity, neuromuscular re-education, manual therapy, passive range of motion, balance training, moist heat, cryotherapy, patient/family education, coping strategies training, and DME and/or AE instructions  RECOMMENDED OTHER SERVICES: OT recommended pt follow up with her eye doctor as she does report some increased blurred vision since her CVA  CONSULTED AND AGREED WITH PLAN OF CARE: Patient  PLAN FOR NEXT SESSION: see above  Danelle Earthly, MS, OTR/L  [employer] Dials, OT 03/07/2023, 10:03 AM

## 2023-03-10 ENCOUNTER — Ambulatory Visit: Payer: Medicaid Other | Admitting: Physical Therapy

## 2023-03-10 DIAGNOSIS — R262 Difficulty in walking, not elsewhere classified: Secondary | ICD-10-CM

## 2023-03-10 DIAGNOSIS — M6281 Muscle weakness (generalized): Secondary | ICD-10-CM | POA: Diagnosis not present

## 2023-03-10 DIAGNOSIS — R2689 Other abnormalities of gait and mobility: Secondary | ICD-10-CM

## 2023-03-10 DIAGNOSIS — R278 Other lack of coordination: Secondary | ICD-10-CM

## 2023-03-10 DIAGNOSIS — R2681 Unsteadiness on feet: Secondary | ICD-10-CM | POA: Diagnosis not present

## 2023-03-10 DIAGNOSIS — Z7689 Persons encountering health services in other specified circumstances: Secondary | ICD-10-CM | POA: Diagnosis not present

## 2023-03-10 NOTE — Therapy (Unsigned)
OUTPATIENT PHYSICAL THERAPY TREATMENT    Patient Name: Anna Tapia MRN: [id number] DOB:June 25, 1991, 32 y.o., female Today's Date: 03/10/2023   PCP: Margarita Mail, DO  REFERRING PROVIDER: Margarita Mail, DO   END OF SESSION:   PT End of Session - 03/10/23 1522     Visit Number 18    Number of Visits 24    Date for PT Re-Evaluation 03/20/23    Authorization Type  [employer]ICA[employer]AN    Authorization Time Period 12/26/22-03/20/23    Authorization - Number of Visits 19    Progress Note Due on Visit 20    PT Start Time 1515    PT Stop Time 1600    PT Time Calculation (min) 45 min    Equipment Utilized During Treatment Gait belt    Activity Tolerance Patient tolerated treatment well;No increased pain    Behavior During Therapy WFL for tasks assessed/performed                 Past Medical History:  Diagnosis Date   Asthma    Brain aneurysm    CHF (congestive heart failure) (HCC)    Stroke [facility])    Past Surgical History:  Procedure Laterality Date   VENTRICULOPERITONEAL SHUNT     Patient Active Problem List   Diagnosis Date Noted   Major depressive disorder, recurrent episode, moderate (HCC) 02/07/2022   Sepsis secondary to UTI (HCC) 05/01/2020   VP (ventriculoperitoneal) shunt status 05/01/2020   Right ovarian cyst 05/01/2020   Elevated LFTs 05/01/2020   Vaginal spotting 05/01/2020    ONSET DATE: 07/24/2022  REFERRING DIAG: I69.30 (ICD-10-CM) - History of CVA with residual deficit  THERAPY DIAG:  Difficulty in walking, not elsewhere classified  Muscle weakness (generalized)  Unsteadiness on feet  Other lack of coordination  Other abnormalities of gait and mobility  Balance disorder  Rationale for Evaluation and Treatment: Rehabilitation  SUBJECTIVE:                                                                                                                                                                                               SUBJECTIVE STATEMENT:  Pt reports doing okay today with no changes since last session. Continues to have arm pain on the R side. Reports that she is able to continue to use the R arm more functionally with daily tasks though.        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  PAIN:  Are you having pain? Yes see above  RUE   PRECAUTIONS: None  WEIGHT BEARING RESTRICTIONS: No  FALLS: Has patient fallen in last 6 months? Yes. Number of falls 1, during the stroke and fell in the shower.  PATIENT GOALS: Get more mobility in the arm and less pain in the leg.  OBJECTIVE:   OBJECTIVE:  taken at eval unless otherwise stated.    DIAGNOSTIC FINDINGS:      CLINICAL DATA:  Initial evaluation for intracranial hemorrhage.   EXAM: CT ANGIOGRAPHY HEAD AND NECK    IMPRESSION: 1. 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 Again, prominent arterial contribution to the AVM is seen from the left ACA and posterior circulation, with primary venous drainage into the deep venous system. Associated 7 mm aneurysm along the right anterolateral aspect of the AVM as above. 2. Diffuse tortuosity and ectasia elsewhere about the major arterial vasculature of the head and neck. No large vessel occlusion or hemodynamically significant stenosis. No other acute vascular abnormality.   COGNITION: Overall cognitive status: Within functional limits  for tasks assessed             SENSATION: WFL   COORDINATION: Pt limited with quick movements during supination/pronation of the wrist on the R side and also with thumb to finger movements.   POSTURE: rounded shoulders   LOWER EXTREMITY ROM:      Active  Right Eval Left Eval  Hip flexion      Hip extension      Hip abduction      Hip adduction      Hip internal rotation      Hip external rotation      Knee flexion      Knee extension      Ankle dorsiflexion      Ankle plantarflexion      Ankle inversion      Ankle eversion       (Blank rows = not tested)   LOWER EXTREMITY MMT:     MMT Right Eval Left Eval  Hip flexion 4 4+  Hip abduction 4 4+  Hip adduction 4- 4-  Knee flexion 4- 4+  Knee extension 4 4+  Ankle dorsiflexion 4 4+  (Blank rows = not tested)     TRANSFERS: Assistive device utilized: None  Sit to stand: Complete Independence Stand to sit: Complete Independence Chair to chair: Complete Independence   GAIT: Gait pattern: step through pattern, decreased arm swing- Right, decreased step length- Left, decreased stance time- Right, decreased stride length, decreased hip/knee flexion- Right, decreased ankle dorsiflexion- Right, and circumduction- Right Distance walked: 40' Assistive device utilized: None Level of assistance: Complete Independence Comments: Pt able to ambulate safely without AD.   FUNCTIONAL TESTS:  5 times sit to stand: 12/26/22 15.44 sec. 01/30/23: 11.51 sec  Timed up and go (TUG): 15.65 sec 01/30/23: 10.99 sec (average of 2 trials)   6 minute walk test: 619' 2 standing rest breaks 01/30/23 924' no AD and no rest breaks  10 meter walk test: 15.46 sec 01/30/23: normal:13.06sec(.14m/s) and fast: 11.7(.73m/s) Functional gait  assessment: 15/30. 01/30/23:  18/30   PATIENT SURVEYS:  FOTO 48/58/ 01/30/23: 51     TODAY'S TREATMENT:                                                                                                                               DATE: 03/10/23  NUSTEP reciprocal movement training level 3 x 4 min level 1 x 1 min cool down. Cues for full ROM on the R UE.   Forced use of RLE to perform step up step down on 6inch step. Forward 2x 5 BLE lateral 2x 5 BLE.   Stepping forward/reverse over 5inch hurdle 2x 5 BLE  Blazepods:  Activity Description: up/down 4 steps  Activity Setting:  random Number of Pods:  5  Cycles/Sets:  2 Duration (Time or Hit Count):  13 Patient Stats  total Time:  1:45 and 1:30  CGA provided by PT throughout session unless otherwise noted. Cues for posture and tactile cues for weight shift for retroversion with WB into the RLE. Min cues also provided to decrease activation of RUE and allow improved trunkal mobility. Therapeutic rest breaks required between sets due to BLE fatigue and mild dizziness when performing blaze pod task on steps.      PATIENT EDUCATION: Education details: pt educated throughout session about proper posture and technique with exercises. Improved exercise technique, movement at target joints, use of target muscles after min to mod verbal, visual, tactile cues.  Person educated: Patient Education method: Explanation Education comprehension: verbalized understanding  HOME EXERCISE PROGRAM:   Access Code: [email or web address] URL: [email or web address] Date: 01/07/2023 Prepared by: Grier Rocher  Exercises - Staggered Sit-to-Stand with BUE push from RLE - 1 x daily - 7 x weekly - 3 sets - 5 reps  Access Code: [email or web address] URL: [email or web address] Date: 01/22/2023 Prepared by: Grier Rocher  Exercises - Seated Figure 4 Piriformis Stretch  - 1 x daily - 7 x weekly - 2 sets - 3 reps - 60 sec hold   PT instructed pt in HEP  Lateral lunge. X 5 bil and UE supported on rail Standing hip flexor stretch x 20 sec bil  Piriformis stretch x 1 min with increasing over pressure with trunk flexion Sit<>stand pushing from RLE x 10 .  Hip abduction  x 10 BLE Hip extension x 10 BLE  Cues for posture and proper set up to reduced compensation on the RLE through trunk   GOALS: Goals reviewed with patient? Yes  SHORT TERM GOALS: Target date: 01/23/2023  Pt will be independent with HEP in order to demonstrate increased ability to perform tasks related to occupation/hobbies. Baseline: Pt not given HEP at initial evaluation. Goal status: MET   LONG TERM GOALS: Target date: 03/20/2023  1.  Patient (> 8 years old) will complete five times sit to stand test in < 15 seconds indicating an increased LE strength and improved balance. Baseline: 15.44 sec. 3/28 11.51 sec  Goal status: MET  2.  Patient will increase FOTO score to equal to or greater than 58 to demonstrate statistically significant improvement in mobility and quality of life.  Baseline: 48 01/30/23: 51  Goal status: Progressing    3.  Patient will increase FGA score by > 4 points to demonstrate decreased fall risk during functional activities. Baseline: 15/30. 01/30/23:  18/30 Goal status: Progressing    4.  Patient will reduce timed up and go to <11 seconds to reduce fall risk and demonstrate improved transfer/gait ability. Baseline: 15.65 sec. 01/30/23: 10.99 sec  Goal status:  MET   5.  Patient will increase 10 meter walk test to >1.54m/s as to improve gait speed for better community ambulation and to reduce fall risk. Baseline: 15.46 sec. 01/30/23: normal:13.06sec(.57m/s) Goal status: progressing   6.  Patient will increase six minute walk test distance to >1000 for progression to community ambulator and improve gait ability Baseline: 50' with 2 standing rest breaks, no AD. 01/30/23 924' no AD and no rest breaks  Goal status: progressing     ASSESSMENT:  CLINICAL IMPRESSION:  Pt continues to put forth good effort in PT sessions. PT treatment focused on dynamic and simulated gait and balance training to allow increased safety and autonomy in daily tasks and real world  situations. Pt able to correct most LOB except in retroversion stepping backward over obstacles. Pt will continue to benefit from skilled physical therapy intervention to address impairments, improve QOL, and attain therapy goals.      OBJECTIVE IMPAIRMENTS: Abnormal gait, decreased activity tolerance, decreased balance, decreased endurance, decreased mobility, difficulty walking, decreased ROM, decreased strength, and pain.   ACTIVITY LIMITATIONS: carrying, lifting, bending, standing, squatting, sleeping, bathing, toileting, reach over head, hygiene/grooming, and locomotion level  PARTICIPATION LIMITATIONS: meal prep, cleaning, laundry, driving, shopping, community activity, and yard work  PERSONAL FACTORS: Age, Past/current experiences, Social background, Time since onset of injury/illness/exacerbation, Transportation, and 3+ comorbidities: asthma, depression, CHF  are also affecting patient's functional outcome.   REHAB POTENTIAL: Good  CLINICAL DECISION MAKING: Stable/uncomplicated  EVALUATION COMPLEXITY: Moderate  PLAN:  PT FREQUENCY: 1-2x/week, preferably 2x/week but due to insurance, may only be able to be seen 1x/week  PT DURATION: 12 weeks  PLANNED INTERVENTIONS: Therapeutic exercises, Therapeutic activity, Neuromuscular re-education, Balance training, Gait training, Patient/Family education, Self Care, Joint mobilization, Joint manipulation, Stair training, Vestibular training, Canalith repositioning, Orthotic/Fit training, DME instructions, Aquatic Therapy, Dry Needling, Electrical stimulation, Spinal manipulation, Spinal mobilization, Cryotherapy, Moist heat, Taping, Manual therapy, and Re-evaluation   PLAN FOR NEXT SESSION:   High intensity gait and balance training. Blaze pods and airex pad.  Endurance training.   Golden Pop PT ,DPT Physical Therapist- [place]  [facility]   5:29 PM 03/10/23

## 2023-03-12 ENCOUNTER — Ambulatory Visit: Payer: Medicaid Other | Admitting: Physical Therapy

## 2023-03-12 ENCOUNTER — Encounter: Payer: Self-pay | Admitting: Internal Medicine

## 2023-03-12 ENCOUNTER — Ambulatory Visit: Payer: Medicaid Other

## 2023-03-16 ENCOUNTER — Other Ambulatory Visit: Payer: Self-pay | Admitting: Internal Medicine

## 2023-03-17 ENCOUNTER — Ambulatory Visit: Payer: Medicaid Other | Admitting: Physical Therapy

## 2023-03-17 DIAGNOSIS — R2689 Other abnormalities of gait and mobility: Secondary | ICD-10-CM

## 2023-03-17 DIAGNOSIS — R262 Difficulty in walking, not elsewhere classified: Secondary | ICD-10-CM | POA: Diagnosis not present

## 2023-03-17 DIAGNOSIS — M6281 Muscle weakness (generalized): Secondary | ICD-10-CM | POA: Diagnosis not present

## 2023-03-17 DIAGNOSIS — R278 Other lack of coordination: Secondary | ICD-10-CM | POA: Diagnosis not present

## 2023-03-17 DIAGNOSIS — R2681 Unsteadiness on feet: Secondary | ICD-10-CM | POA: Diagnosis not present

## 2023-03-17 DIAGNOSIS — Z7689 Persons encountering health services in other specified circumstances: Secondary | ICD-10-CM | POA: Diagnosis not present

## 2023-03-17 NOTE — Therapy (Signed)
OUTPATIENT PHYSICAL THERAPY TREATMENT/ Re-certification     Patient Name: Anna Tapia MRN: [id number] DOB:Nov 16, 1990, 32 y.o., female Today's Date: 03/17/2023   PCP: Margarita Mail, DO  REFERRING PROVIDER: Margarita Mail, DO   END OF SESSION:   PT End of Session - 03/17/23 1434     Visit Number 19    Number of Visits 31    Date for PT Re-Evaluation 04/28/23    Authorization Type [place] MEDICA[employer]PAID HE[employer] PLAN    Authorization Time Period 12/26/22-03/20/23    Authorization - Number of Visits 19    Progress Note Due on Visit 20    PT Start Time 1430    PT Stop Time 1516    PT Time Calculation (min) 46 min    Equipment Utilized During Treatment Gait belt    Activity Tolerance Patient tolerated treatment well;No increased pain    Behavior During Therapy WFL for tasks assessed/performed                 Past Medical History:  Diagnosis Date   Asthma    Brain aneurysm    CHF (congestive heart failure) (HCC)    Stroke [facility])    Past Surgical History:  Procedure Laterality Date   VENTRICULOPERITONEAL SHUNT     Patient Active Problem List   Diagnosis Date Noted   Major depressive disorder, recurrent episode, moderate (HCC) 02/07/2022   Sepsis secondary to UTI (HCC) 05/01/2020   VP (ventriculoperitoneal) shunt status 05/01/2020   Right ovarian cyst 05/01/2020   Elevated LFTs 05/01/2020   Vaginal spotting 05/01/2020    ONSET DATE: 07/24/2022  REFERRING DIAG: I69.30 (ICD-10-CM) - History of CVA with residual deficit  THERAPY DIAG:  Difficulty in walking, not elsewhere classified  Other lack of coordination  Muscle weakness (generalized)  Unsteadiness on feet  Other abnormalities of gait and mobility  Balance disorder  Rationale for Evaluation and Treatment: Rehabilitation  SUBJECTIVE:                                                                                                                                                                                               SUBJECTIVE STATEMENT:  Pt reports doing okay today. Reports feeling a little tired and some mild R flank pain. Continues to have R arm and leg pain. Frustrated with continued tone in Arm in Leg.         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  PAIN:  Are you having pain? Yes see above  RUE   PRECAUTIONS: None  WEIGHT BEARING RESTRICTIONS: No  FALLS: Has patient fallen in last 6 months? Yes. Number of falls 1, during the stroke and fell in the shower.  PATIENT GOALS: Get more mobility in the arm and less pain in the leg.  OBJECTIVE:   OBJECTIVE:  taken at eval unless otherwise stated.    DIAGNOSTIC FINDINGS:      CLINICAL DATA:  Initial evaluation for intracranial hemorrhage.   EXAM: CT ANGIOGRAPHY HEAD AND NECK    IMPRESSION: 1. 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 Again, prominent arterial contribution to the AVM is seen from the left ACA and posterior circulation, with primary venous drainage into the deep venous system. Associated 7 mm aneurysm along the right anterolateral aspect of the AVM as above. 2. Diffuse tortuosity and ectasia elsewhere about the major arterial vasculature of the head and neck. No large vessel occlusion or hemodynamically significant stenosis. No other acute vascular abnormality.   COGNITION: Overall cognitive status: Within functional limits for tasks  assessed             SENSATION: WFL   COORDINATION: Pt limited with quick movements during supination/pronation of the wrist on the R side and also with thumb to finger movements.   POSTURE: rounded shoulders   LOWER EXTREMITY ROM:      Active  Right Eval Left Eval  Hip flexion      Hip extension      Hip abduction      Hip adduction      Hip internal rotation      Hip external rotation      Knee flexion      Knee extension      Ankle dorsiflexion      Ankle plantarflexion      Ankle inversion      Ankle eversion       (Blank rows = not tested)   LOWER EXTREMITY MMT:     MMT Right Eval Left Eval  Hip flexion 4 4+  Hip abduction 4 4+  Hip adduction 4 4-  Knee flexion 4- 4+  Knee extension 4+ 4+  Ankle dorsiflexion 4 4+  (Blank rows = not tested)     TRANSFERS: Assistive device utilized: None  Sit to stand: Complete Independence Stand to sit: Complete Independence Chair to chair: Complete Independence   GAIT: Gait pattern: step through pattern, decreased arm swing- Right, decreased step length- Left, decreased stance time- Right, decreased stride length, decreased hip/knee flexion- Right, decreased ankle dorsiflexion- Right, and circumduction- Right Distance walked: 40' Assistive device utilized: None Level of assistance: Complete Independence Comments: Pt able to ambulate safely without AD.   FUNCTIONAL TESTS:  5 times sit to stand: 12/26/22 15.44 sec. 01/30/23: 11.51 sec  5/13: 11.68sec    Timed up and go (TUG): 15.65 sec 01/30/23: 10.99 sec (average of 2 trials)  5/13: 11.03  6 minute walk test: 619' 2 standing rest breaks 01/30/23 924' no AD and no rest breaks 5/13: 931ft  10 meter walk test: 15.46 sec 01/30/23:  normal:13.06sec(.51m/s) and fast: 11.7(.92m/s) 5/13: 11.03sec (0.32m/s)  Functional gait assessment: 15/30. 01/30/23:  18/30 5/13:18/30   PATIENT SURVEYS:  FOTO 48/58/ 01/30/23: 51 5/13: 46     TODAY'S TREATMENT:                                                                                                                               DATE: 03/17/23   PT assessed pt's progress towards LTG.    Pt performed 5 time sit<>stand (5xSTS): 11.68 sec (>15 sec indicates increased fall risk)   6 Min Walk Test:  Instructed patient to ambulate as quickly and as safely as possible for 6 minutes using LRAD. Patient was allowed to take standing rest breaks without stopping the test, but if the patient required a sitting rest break the clock would be stopped and the test would be over.  Results: 987ft feet  using no AD with supervision assist from PT for safety. . Results indicate that the patient has reduced endurance with ambulation compared to age matched norms.  Age Matched Norms: 12-69 yo M: 72 F: 78, 65-79 yo M: 88 F: 471, 50-89 yo M: 417 F: 392 MDC: 58.21 meters (190.98 feet) or 50 meters (ANPTA Core Set of Outcome Measures for Adults with Neurologic Conditions, 2018)   PT instructed pt in TUG: 11.03  (average of 3 trials; >13.5 sec indicates increased fall risk)   Patient demonstrates increased fall risk as noted by score of 18/30 on  Functional Gait Assessment.   <22/30 = predictive of falls, <20/30 = fall in 6 months, <18/30 = predictive of falls in PD MCID: 5 points stroke population, 4 points geriatric population (ANPTA Core Set of Outcome Measures for Adults with Neurologic Conditions, 2018)  PT instructed pt in dynamic balance HEP. See below for details.    PATIENT EDUCATION: Education details: pt educated throughout session about proper posture and technique with exercises. Improved exercise technique, movement at target joints, use of target muscles after min to mod verbal, visual, tactile cues.  Person educated: Patient Education method: Explanation Education comprehension: verbalized understanding  HOME EXERCISE PROGRAM:   Access Code: [email or web address] URL: [email or web address] Date:  03/17/2023 Prepared by: Grier Rocher  Exercises - Standing Tandem Balance with Counter Support  - 1 x daily - 7 x weekly - 3 sets - 10 reps - Standing Single Leg Stance with Counter Support  - 1 x daily - 7 x weekly - 3 sets - 10 reps - Backward Walking with Counter Support  - 1 x daily - 7 x weekly - 3 sets - 10 reps - Side Stepping with Counter Support  - 1 x daily - 7 x weekly - 3 sets - 10 reps  Access Code: [email or web address] URL: [email or web address] Date: 01/07/2023 Prepared by: Grier Rocher  Exercises - Staggered Sit-to-Stand with BUE push from RLE - 1 x daily - 7 x weekly - 3 sets - 5 reps  Access Code: [email or web address] URL:  [email or web address] Date: 01/22/2023 Prepared by: Grier Rocher  Exercises - Seated Figure 4 Piriformis Stretch  - 1 x daily - 7 x weekly - 2 sets - 3 reps - 60 sec hold   PT instructed pt in HEP  Lateral lunge. X 5 bil and UE supported on rail Standing hip flexor stretch x 20 sec bil  Piriformis stretch x 1 min with increasing over pressure with trunk flexion Sit<>stand pushing from RLE x 10 .  Hip abduction x 10 BLE Hip extension x 10 BLE  Cues for posture and proper set up to reduced compensation on the RLE through trunk   GOALS: Goals reviewed with patient? Yes  SHORT TERM GOALS: Target date: 01/23/2023  Pt will be independent with HEP in order to demonstrate increased ability to perform tasks related to occupation/hobbies. Baseline: Pt not given HEP at initial evaluation. Goal status: MET   LONG TERM GOALS: Target date: 03/20/2023  1.  Patient (> 56 years old) will complete five times sit to stand test in < 15 seconds indicating an increased LE strength and improved balance. Baseline: 15.44 sec. 3/28 11.51 sec. 5/13: 11.68sec   Goal status: MET   2.  Patient will increase FOTO score to equal to or greater than 58 to demonstrate statistically significant improvement in mobility and quality of life.  Baseline: 48  01/30/23: 51 5/13: 46  Goal status: Progressing    3.  Patient will increase FGA score by > 4 points to demonstrate decreased fall risk during functional activities. Baseline: 15/30. 01/30/23:  18/30. 5/13: 11.68sec   Goal status: Progressing    4.  Patient will reduce timed up and go to <11 seconds to reduce fall risk and demonstrate improved transfer/gait ability. Baseline: 15.65 sec. 01/30/23: 10.99 sec 5/13: 11.03 Goal status:  MET   5.  Patient will increase 10 meter walk test to >1.41m/s as to improve gait speed for better community ambulation and to reduce fall risk. Baseline: 15.46 sec. 01/30/23: normal:13.06sec(.34m/s)  5/13: 11.03sec (0.61m/s) Goal status: progressing   6.  Patient will increase six minute walk test distance to >1000 for progression to community ambulator and improve gait ability Baseline: 33' with 2 standing rest breaks, no AD. 01/30/23 924' no AD and no rest breaks  5/13: 978ft Goal status: progressing     ASSESSMENT:  CLINICAL IMPRESSION:  Pt continues to put forth good effort in PT sessions. PT treatment focused on assessment of progress towards LTG. Pt demonstrates, mild regression in self reported function, but has maintained or improved function as evidenced in increase in 6 min walk test, and Tug of 0.9 m/s increased from 032m/s in March. Pt demonstrates comparable scores in tug and 5xSTS on this day, but does endorse feeling more tired than usual. Patient's condition has the potential to improve in response to therapy. Maximum improvement is yet to be obtained. The anticipated improvement is attainable and reasonable in a generally predictable time.  Pt will continue to benefit from skilled physical therapy intervention to address impairments, improve QOL, and attain therapy goals.      OBJECTIVE IMPAIRMENTS: Abnormal gait, decreased activity tolerance, decreased balance, decreased endurance, decreased mobility, difficulty walking, decreased ROM,  decreased strength, and pain.   ACTIVITY LIMITATIONS: carrying, lifting, bending, standing, squatting, sleeping, bathing, toileting, reach over head, hygiene/grooming, and locomotion level  PARTICIPATION LIMITATIONS: meal prep, cleaning, laundry, driving, shopping, community activity, and yard work  PERSONAL FACTORS: Age, Past/current experiences, Social background, Time since onset of injury/illness/exacerbation, Transportation, and  3+ comorbidities: asthma, depression, CHF  are also affecting patient's functional outcome.   REHAB POTENTIAL: Good  CLINICAL DECISION MAKING: Stable/uncomplicated  EVALUATION COMPLEXITY: Moderate  PLAN:  PT FREQUENCY: 1-2x/week, preferably 2x/week but due to insurance, may only be able to be seen 1x/week  PT DURATION: 12 weeks  PLANNED INTERVENTIONS: Therapeutic exercises, Therapeutic activity, Neuromuscular re-education, Balance training, Gait training, Patient/Family education, Self Care, Joint mobilization, Joint manipulation, Stair training, Vestibular training, Canalith repositioning, Orthotic/Fit training, DME instructions, Aquatic Therapy, Dry Needling, Electrical stimulation, Spinal manipulation, Spinal mobilization, Cryotherapy, Moist heat, Taping, Manual therapy, and Re-evaluation   PLAN FOR NEXT SESSION:   High intensity gait and balance training. Endurance training.     Golden Pop PT ,DPT Physical Therapist- [place]  [facility]   3:20 PM 03/17/23

## 2023-03-19 ENCOUNTER — Ambulatory Visit: Payer: Medicaid Other

## 2023-03-19 ENCOUNTER — Ambulatory Visit: Payer: Medicaid Other | Admitting: Physical Therapy

## 2023-03-19 DIAGNOSIS — R2681 Unsteadiness on feet: Secondary | ICD-10-CM

## 2023-03-19 DIAGNOSIS — R2689 Other abnormalities of gait and mobility: Secondary | ICD-10-CM | POA: Diagnosis not present

## 2023-03-19 DIAGNOSIS — R262 Difficulty in walking, not elsewhere classified: Secondary | ICD-10-CM | POA: Diagnosis not present

## 2023-03-19 DIAGNOSIS — M6281 Muscle weakness (generalized): Secondary | ICD-10-CM

## 2023-03-19 DIAGNOSIS — R278 Other lack of coordination: Secondary | ICD-10-CM | POA: Diagnosis not present

## 2023-03-19 NOTE — Therapy (Signed)
OUTPATIENT PHYSICAL THERAPY TREATMENT/  PHYSICAL THERAPY PROGRESS NOTE   Dates of reporting period  01/30/2023    to   03/19/2023     Patient Name: Anna Tapia MRN: [id number] DOB:19-Jul-1991, 32 y.o., female Today's Date: 03/19/2023   PCP: Margarita Mail, DO  REFERRING PROVIDER: Margarita Mail, DO   END OF SESSION:   PT End of Session - 03/19/23 1600     Visit Number 20    Number of Visits 31    Date for PT Re-Evaluation 04/28/23    Authorization Type [place] [employer]ICA[employer]PA[employer] PLAN    Authorization Time Period 12/26/22-03/20/23    Authorization - Number of Visits --    Progress Note Due on Visit 20    PT Start Time 1600    PT Stop Time 1645    PT Time Calculation (min) 45 min    Equipment Utilized During Treatment Gait belt    Activity Tolerance Patient tolerated treatment well;No increased pain    Behavior During Therapy WFL for tasks assessed/performed                 Past Medical History:  Diagnosis Date   Asthma    Brain aneurysm    CHF (congestive heart failure) (HCC)    Stroke [facility])    Past Surgical History:  Procedure Laterality Date   VENTRICULOPERITONEAL SHUNT     Patient Active Problem List   Diagnosis Date Noted   Major depressive disorder, recurrent episode, moderate (HCC) 02/07/2022   Sepsis secondary to UTI (HCC) 05/01/2020   VP (ventriculoperitoneal) shunt status 05/01/2020   Right ovarian cyst 05/01/2020   Elevated LFTs 05/01/2020   Vaginal spotting 05/01/2020    ONSET DATE: 07/24/2022  REFERRING DIAG: I69.30 (ICD-10-CM) - History of CVA with residual deficit  THERAPY DIAG:  Difficulty in walking, not elsewhere classified  Other lack of coordination  Muscle weakness (generalized)  Unsteadiness on feet  Other abnormalities of gait and mobility  Balance disorder  Rationale for Evaluation and Treatment: Rehabilitation  SUBJECTIVE:                                                                                                                                                                                               SUBJECTIVE STATEMENT:  Pt reports doing okay today. Excited that she was approved for more PT visits through medicaid, as pt is aware of continued deficits from CVA. Only mild pain reported in R hand, but no number given.       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  PAIN:  Are you having pain? Yes see above  RUE   PRECAUTIONS: None  WEIGHT BEARING RESTRICTIONS: No  FALLS: Has patient fallen in last 6 months? Yes. Number of falls 1, during the stroke and fell in the shower.  PATIENT GOALS: Get more mobility in the arm and less pain in the leg.  OBJECTIVE:   OBJECTIVE:  taken at eval unless otherwise stated.    DIAGNOSTIC FINDINGS:      CLINICAL DATA:  Initial evaluation for intracranial hemorrhage.   EXAM: CT ANGIOGRAPHY HEAD AND NECK    IMPRESSION: 1. 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 Again, prominent arterial contribution to the AVM is seen from the left ACA and posterior circulation, with primary venous drainage into the deep venous system. Associated 7 mm aneurysm along the right anterolateral aspect of the AVM as above. 2. Diffuse tortuosity and ectasia elsewhere about the major arterial vasculature of the head and neck. No large vessel occlusion or hemodynamically significant stenosis. No other acute  vascular abnormality.   COGNITION: Overall cognitive status: Within functional limits for tasks assessed             SENSATION: WFL   COORDINATION: Pt limited with quick movements during supination/pronation of the wrist on the R side and also with thumb to finger movements.   POSTURE: rounded shoulders   LOWER EXTREMITY ROM:      Active  Right Eval Left Eval  Hip flexion      Hip extension      Hip abduction      Hip adduction      Hip internal rotation      Hip external rotation      Knee flexion      Knee extension      Ankle dorsiflexion      Ankle plantarflexion      Ankle inversion      Ankle eversion       (Blank rows = not tested)   LOWER EXTREMITY MMT:     MMT Right Eval Left Eval  Hip flexion 4 4+  Hip abduction 4 4+  Hip adduction 4 4-  Knee flexion 4- 4+  Knee extension 4+ 4+  Ankle dorsiflexion 4 4+  (Blank rows = not tested)     TRANSFERS: Assistive device utilized: None  Sit to stand: Complete Independence Stand to sit: Complete Independence Chair to chair: Complete Independence   GAIT: Gait pattern: step through pattern, decreased arm swing- Right, decreased step length- Left, decreased stance time- Right, decreased stride length, decreased hip/knee flexion- Right, decreased ankle dorsiflexion- Right, and circumduction- Right Distance walked: 40' Assistive device utilized: None Level of assistance: Complete Independence Comments: Pt able to ambulate safely without AD.   FUNCTIONAL TESTS:  5 times sit to stand: 12/26/22 15.44 sec. 01/30/23: 11.51 sec  5/13: 11.68sec    Timed up and go (TUG): 15.65 sec 01/30/23: 10.99 sec (average of 2 trials)  5/13: 11.03  6 minute walk test: 619' 2  standing rest breaks 01/30/23 924' no AD and no rest breaks 5/13: 914ft  10 meter walk test: 15.46 sec 01/30/23: normal:13.06sec(.3m/s) and fast: 11.7(.110m/s) 5/13: 11.03sec (0.74m/s)  Functional gait assessment: 15/30. 01/30/23:  18/30  5/13:18/30   PATIENT SURVEYS:  FOTO 48/58/ 01/30/23: 51 5/13: 46     TODAY'S TREATMENT:                                                                                                                              DATE: 03/19/23  Nustep level 3 x 3 min level 4 x1 min level 1 x 1 min  Cues for attention to the RUE to allow full ROM and reduce spasticity.    Standing calf stretch x 15 sec bil  Lateral lunge for hip adductor stretch 2 x 10 sec  Hip flexor stretch on 5inch step 2x 15 sec bil.  Calf raise x 12 bil.  UE supported on the rail at wall.   Blaze pod. Random setting, 3 pods, sitting 5 inch step, 45 sec. Performed 2 x 45 sec bil therapeutic rest break between bouts. Pt required min assist when standing on RLE to improve weight shift and cues to maintain terminal knee extension on the R.   LLE Foot tap on 5 inch step with CGA progressing to supervision assist for weight shift over the RLE.   Gait training with 3# ankle weights x 136ft with supervision assist and cues for pelvic rotation to improve symmerty R vs L.   Obstacle navigation performed x 3 with 3# ankle weight x and x 2 with no resistance. Step up/down 4inch step, over 3 canes, weave through 3 cones and step over half bolster.    Goal assessment performed on prior PT session, see below for progress update towards LTG    PATIENT EDUCATION: Education details: pt educated throughout session about proper posture and technique with exercises. Improved exercise technique, movement at target joints, use of target muscles after min to mod verbal, visual, tactile cues.  Person educated: Patient Education method: Explanation Education comprehension: verbalized understanding  HOME EXERCISE PROGRAM:   Access Code: [email or web address] URL: [email or web address] Date: 03/17/2023 Prepared by: Grier Rocher  Exercises - Standing Tandem Balance with Counter Support  - 1 x daily - 7 x weekly - 3 sets - 10 reps - Standing  Single Leg Stance with Counter Support  - 1 x daily - 7 x weekly - 3 sets - 10 reps - Backward Walking with Counter Support  - 1 x daily - 7 x weekly - 3 sets - 10 reps - Side Stepping with Counter Support  - 1 x daily - 7 x weekly - 3 sets - 10 reps  Access Code: [email or web address] URL: [email or web address] Date: 01/07/2023 Prepared by: Grier Rocher  Exercises - Staggered Sit-to-Stand with BUE push from RLE - 1 x daily - 7 x weekly - 3 sets - 5 reps  Access Code: [email or web address] URL: [email or web address] Date: 01/22/2023  Prepared by: Grier Rocher  Exercises - Seated Figure 4 Piriformis Stretch  - 1 x daily - 7 x weekly - 2 sets - 3 reps - 60 sec hold   PT instructed pt in HEP  Lateral lunge. X 5 bil and UE supported on rail Standing hip flexor stretch x 20 sec bil  Piriformis stretch x 1 min with increasing over pressure with trunk flexion Sit<>stand pushing from RLE x 10 .  Hip abduction x 10 BLE Hip extension x 10 BLE  Cues for posture and proper set up to reduced compensation on the RLE through trunk   GOALS: Goals reviewed with patient? Yes  SHORT TERM GOALS: Target date: 01/23/2023  Pt will be independent with HEP in order to demonstrate increased ability to perform tasks related to occupation/hobbies. Baseline: Pt not given HEP at initial evaluation. Goal status: MET   LONG TERM GOALS: Target date: 03/20/2023  1.  Patient (> 51 years old) will complete five times sit to stand test in < 15 seconds indicating an increased LE strength and improved balance. Baseline: 15.44 sec. 3/28 11.51 sec. 5/13: 11.68sec   Goal status: MET   2.  Patient will increase FOTO score to equal to or greater than 58 to demonstrate statistically significant improvement in mobility and quality of life.  Baseline: 48 01/30/23: 51 5/13: 46  Goal status: Progressing    3.  Patient will increase FGA score by > 4 points to demonstrate decreased fall risk during functional  activities. Baseline: 15/30. 01/30/23:  18/30. 5/13: 11.68sec   Goal status: Progressing    4.  Patient will reduce timed up and go to <11 seconds to reduce fall risk and demonstrate improved transfer/gait ability. Baseline: 15.65 sec. 01/30/23: 10.99 sec 5/13: 11.03 Goal status:  MET   5.  Patient will increase 10 meter walk test to >1.22m/s as to improve gait speed for better community ambulation and to reduce fall risk. Baseline: 15.46 sec. 01/30/23: normal:13.06sec(.62m/s)  5/13: 11.03sec (0.42m/s) Goal status: progressing   6.  Patient will increase six minute walk test distance to >1000 for progression to community ambulator and improve gait ability Baseline: 42' with 2 standing rest breaks, no AD. 01/30/23 924' no AD and no rest breaks  5/13: 965ft Goal status: progressing     ASSESSMENT:  CLINICAL IMPRESSION:  Pt continues to put forth good effort in PT sessions. PT treatment focused on dynamic balance and use of RLE. Standing stretches for tone and spasticity management with mild improvement in RLE ROM. Pt demonstrating improved balance and use of RLE to navigate step and obstacle management with reduced assist from Prior PT treatments; supervision assist to step over canes and bolster with supervision assist only leading with BLE.  The anticipated improvement is attainable and reasonable in a generally predictable time.  Pt will continue to benefit from skilled physical therapy intervention to address impairments, improve QOL, and attain therapy goals.      OBJECTIVE IMPAIRMENTS: Abnormal gait, decreased activity tolerance, decreased balance, decreased endurance, decreased mobility, difficulty walking, decreased ROM, decreased strength, and pain.   ACTIVITY LIMITATIONS: carrying, lifting, bending, standing, squatting, sleeping, bathing, toileting, reach over head, hygiene/grooming, and locomotion level  PARTICIPATION LIMITATIONS: meal prep, cleaning, laundry, driving, shopping,  community activity, and yard work  PERSONAL FACTORS: Age, Past/current experiences, Social background, Time since onset of injury/illness/exacerbation, Transportation, and 3+ comorbidities: asthma, depression, CHF  are also affecting patient's functional outcome.   REHAB POTENTIAL: Good  CLINICAL DECISION MAKING: Stable/uncomplicated  EVALUATION COMPLEXITY: Moderate  PLAN:  PT FREQUENCY: 1-2x/week, preferably 2x/week but due to insurance, may only be able to be seen 1x/week  PT DURATION: 12 weeks  PLANNED INTERVENTIONS: Therapeutic exercises, Therapeutic activity, Neuromuscular re-education, Balance training, Gait training, Patient/Family education, Self Care, Joint mobilization, Joint manipulation, Stair training, Vestibular training, Canalith repositioning, Orthotic/Fit training, DME instructions, Aquatic Therapy, Dry Needling, Electrical stimulation, Spinal manipulation, Spinal mobilization, Cryotherapy, Moist heat, Taping, Manual therapy, and Re-evaluation   PLAN FOR NEXT SESSION:   High intensity gait and balance training. Endurance training. Coordination and spasticity management     Golden Pop PT ,DPT Physical Therapist- [place]  [facility]   4:51 PM 03/19/23

## 2023-03-20 ENCOUNTER — Ambulatory Visit: Payer: Medicaid Other | Attending: Cardiology

## 2023-03-20 DIAGNOSIS — Z7689 Persons encountering health services in other specified circumstances: Secondary | ICD-10-CM | POA: Diagnosis not present

## 2023-03-20 DIAGNOSIS — R0600 Dyspnea, unspecified: Secondary | ICD-10-CM | POA: Diagnosis not present

## 2023-03-20 LAB — ECHOCARDIOGRAM COMPLETE
AR max vel: 2.91 cm2
AV Area VTI: 2.91 cm2
AV Area mean vel: 2.85 cm2
AV Mean grad: 3 mmHg
AV Peak grad: 6.2 mmHg
Ao pk vel: 1.24 m/s
Area-P 1/2: 4.31 cm2
P 1/2 time: 458 msec
S' Lateral: 2.4 cm

## 2023-03-20 NOTE — Therapy (Signed)
OUTPATIENT OCCUPATIONAL THERAPY NEURO TREATMENT NOTE  Patient Name: Anna Tapia MRN: [id number] DOB:November 20, 1990, 32 y.o., female Today's Date: 03/20/2023  PCP: Dr. Margarita Mail REFERRING PROVIDER: Dr. Margarita Mail  END OF SESSION:  OT End of Session - 03/20/23 1325     Visit Number 9    Number of Visits 24    Date for OT Re-Evaluation 04/09/23    Progress Note Due on Visit 10    OT Start Time 1517    OT Stop Time 1600    OT Time Calculation (min) 43 min    Activity Tolerance Patient tolerated treatment well    Behavior During Therapy [facility] for tasks assessed/performed            Past Medical History:  Diagnosis Date   Asthma    Brain aneurysm    CHF (congestive heart failure) (HCC)    Stroke [facility])    Past Surgical History:  Procedure Laterality Date   VENTRICULOPERITONEAL SHUNT     Patient Active Problem List   Diagnosis Date Noted   Major depressive disorder, recurrent episode, moderate (HCC) 02/07/2022   Sepsis secondary to UTI (HCC) 05/01/2020   VP (ventriculoperitoneal) shunt status 05/01/2020   Right ovarian cyst 05/01/2020   Elevated LFTs 05/01/2020   Vaginal spotting 05/01/2020   ONSET DATE: Sept 2023;   REFERRING DIAG:  I63.9 (ICD-10-CM) - CVA (cerebral vascular accident) (HCC)   THERAPY DIAG:  Muscle weakness (generalized)  Other lack of coordination  Rationale for Evaluation and Treatment: Rehabilitation  SUBJECTIVE:  SUBJECTIVE STATEMENT: Pt reported that her doctor increased her Baclofen slightly for night time as of last night, and if that doesn't provide relief to pt, she may try a round of Botox to reduce the pain and stiffness throughout the RUE.  Pt accompanied by: self and aunt  PERTINENT HISTORY: Per Dr. Caralee Ates on 11/19/22: HPI [name] presents to establish care.  She is here with her mom.   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       PRECAUTIONS: None  WEIGHT BEARING RESTRICTIONS: No  PAIN:  Are you having pain? Yes: NPRS scale: 5/10 Pain location: Entire R side, arm and leg Pain description: pins and needles in the hand, arm is achy, moving the arm causes stabbing pain Aggravating factors: movement of the arm Relieving factors: rest; pt reports medication is minimally effective  FALLS: Has patient fallen in last 6 months? Yes. 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  Being independent."  OBJECTIVE:   HAND DOMINANCE: Left  ADLs: Overall ADLs: mostly performed with the  LUE Transfers/ambulation related to ADLs: indep/extra time Eating: cuts food 1 handed Grooming: 1 handed (L) UB Dressing: occasional help to don clothes if tired after a shower; help with straightening clothes; struggles with clothing fasteners LB Dressing: wearing slip on shoes; difficulty with clothing fasteners  Toileting: indep Bathing: modified indep; pt makes attempts to engage the R arm while bathing but reports this is difficult Tub Shower transfers: modified indep Equipment:  see above  IADLs: Shopping: pt can go shopping  accompanied by a family member Light housekeeping: 1 handed Meal Prep: cooking causes fatigue; mostly L handed Community mobility: uses rollator for community mobility; uses [employer] transportation Medication management: uses weekly pill organizer; Engineer, materials: mother manages (pt states she doesn't really have any bills)  Handwriting:  N/A (pt is L hand dominant)  MOBILITY STATUS:  ambulatory without AD; pt seeing PT for LE strengthening and balance deficits  POSTURE COMMENTS:  rounded shoulders Sitting balance: Moves/returns truncal midpoint >2 inches in all planes  ACTIVITY TOLERANCE: Activity tolerance: Pain in the R side significantly limits activity tolerance.  Pt reports she can get fatigued with ADLs and IADLs.  FUNCTIONAL OUTCOME MEASURES: FOTO: 44; 48  UPPER EXTREMITY ROM:    Active ROM Right eval Left Eval WNL  Shoulder flexion 38 (60)   Shoulder abduction    Shoulder adduction 42 (51)   Shoulder extension    Shoulder internal rotation    Shoulder external rotation Hand to back of head (scaption and chin tuck)   Elbow flexion WNL   Elbow extension WNL   Wrist flexion WNL   Wrist extension WNL   Wrist ulnar deviation    Wrist radial deviation    Wrist pronation WNL   Wrist supination WNL   (Blank rows = not tested)  R hand: Able to oppose each digit to thumb with extra time and able to make full composite fist  UPPER EXTREMITY MMT:     MMT Right eval Left Eval 5/5  Shoulder flexion 3-   Shoulder abduction 3-   Shoulder adduction    Shoulder extension    Shoulder internal rotation 3+   Shoulder external rotation 3+   Middle trapezius    Lower trapezius    Elbow flexion 4-   Elbow extension 4   Wrist flexion 4-   Wrist extension 3+   Wrist ulnar deviation    Wrist radial deviation    Wrist pronation    Wrist supination    (Blank rows = not tested)  HAND FUNCTION: Grip strength: Right: 4 lbs; Left: 50 lbs, Lateral pinch: Right: 0  lbs, Left: 17 lbs, and 3 point pinch: Right: 0 lbs, Left: 19 lbs  COORDINATION: Finger Nose Finger test: able to touch nose with increased time 9 Hole Peg test: Right: unable (picked up a peg with repeat trials but unable to move the peg from horizontal to vertical position to place into board) sec; Left: 24 sec  SENSATION: tingling in the R hand  Light touch: Impaired  Proprioception: WFL  EDEMA: N/A  MUSCLE TONE: RUE: Mild and Hypertonic  COGNITION: Overall cognitive status: Within functional limits for tasks assessed  VISION: Subjective report: Pt reports increased blurred vision following her stroke Baseline vision: Wears glasses all the time  VISION ASSESSMENT: Ocular ROM: WFL Gaze preference/alignment: gaze left; slight Tracking/Visual pursuits: Able to track stimulus in all quads without difficulty Saccades: WFL Visual Fields: no apparent deficits  PERCEPTION: WFL  PRAXIS: Impaired: Motor planning  OBSERVATIONS: Pt  guards RUE with flexed elbow and shoulder IR/hand to abdomen during mobility.  R scapular elevation noting increased tension in R upper trap.  TODAY'S TREATMENT:        Moist heat to R shoulder applied intermittently throughout session for pain management/muscle relaxation during simultaneously completion of stretching and neuro re-ed activities noted below.                                                                                                                        Therapeutic Exercise: Performed passive stretching throughout the RUE, including all planes for R shoulder in prep for neuro re-ed activities.    Neuro re-ed: Facilitated R hand FMC/dexterity skills and forward and lateral reaching patterns working to place, remove flip, and [employer] discs from grid.  Provided elevated surface for 1 round to place and remove discs between table top and 6" elevated surface.  Pt required occasional rest breaks and min tactile cues to promote optimal  form when reaching.  Pt primarily worked to manipulate discs with the R thumb and IF, but also practiced reps to engage LF with IF and thumb.  Also practiced reps of using a palmar grasp to scoop up the disc from table top to engage the RF and SF into grasp/release; otherwise R SF remains mostly in extension and abd while pt relies on thumb and IF to pick up each disc.  Pt required intermittent tactile cues to the R SF to actively flex this digit in prep for grasping the disc.  Practiced additional FMC/dexterity skills with the R hand, working to pick up small glass stones from table top.  Practiced reps at table top without non-skid surface, working to pick up stones from flat side up and flat side down.  Pt practiced 2 and 3 point pinch patterns to pick up stones, practiced scooping multiple stones into palm, and storing up to 5 in hand.  PATIENT EDUCATION: Education details: R hand FMC/dexterity skills Person educated: Patient Education method: Explanation and Verbal cues, demo, tactile cues Education comprehension: able to return demo.   HOME EXERCISE PROGRAM: Soft blue theraputty exercises  GOALS: Goals reviewed with patient? Yes  SHORT TERM GOALS: Target date: 02/26/23 (6 weeks)  Pt will be indep to perform HEP for increasing RUE flexibility, strength, and coordination to better engage the RUE into daily tasks. Baseline: Not yet initiated Goal status: INITIAL  LONG TERM GOALS: Target date: 04/09/23 (12 weeks)  Pt will increase FOTO score to 48 or better to indicate clinically relevant improvement in self perceived use of R arm for daily tasks.  Baseline: Eval: 44  Goal status: INITIAL  2.  Pt will increase R active shoulder flexion to 100* or better to achieve functional ROM for UB ADLs and reaching for ADL supplies. Baseline: Eval: R 38 degrees (passive 60); pt reaches during ADLs only with the L arm Goal status: INITIAL  3.  Pt will increase R grip strength by 10 or more lbs  to  enable use of R hand to hold and carry light ADL supplies. Baseline: Eval: R grip 4 lbs, L 50 lbs Goal status: INITIAL  4.  Pt will report <4/10 pain throughout the RUE to enable improved tolerance with using RUE during daily tasks.  Baseline: Eval: 6-7/10 pain throughout the R side (pt predominantly uses L hand for ADLs) Goal status: INITIAL  5.  Pt will increase R hand dexterity/FMC skills to manipulate clothing fasteners with bilat hands. Baseline: Eval: pt avoids clothing fasteners or manages with extra time using L hand only (R 9 hole: unable, L 24 sec) Goal status: INITIAL  ASSESSMENT:  CLINICAL IMPRESSION: Pt reported that her doctor increased her Baclofen slightly for night time as of last night, and if that doesn't provide relief to pt, she may try a round of Botox to reduce the pain and stiffness throughout the RUE.  Pt tolerated RUE stretching and neuro re-ed activities well this date.  Pt does require intermittent passive stretching for R wrist and digit extension to decrease tone in the hand.  Practiced a variety of grasp patterns today, including 2 point and 3 point pinching, and the a palmar grasp when picking up [place] discs, as R SF remains mostly in extension and abd while pt relies on thumb and IF to pick up each disc.  Pt required intermittent tactile cues to the R SF to actively flex this digit in prep for grasping the disc.  Pt is demonstrating improved forward and lateral reaching patterns with the RUE with less compensatory leaning and fewer cues needed for this carry over.  Pt will continue to benefit from skilled OT to reduce R shoulder stiffness, improve RUE strength and coordination, and reduce pain throughout the RUE in order to maximize functional use of the RUE with daily tasks.   PERFORMANCE DEFICITS: in functional skills including ADLs, IADLs, coordination, dexterity, sensation, tone, ROM, strength, pain, flexibility, Fine motor control, Gross motor control,  mobility, balance, body mechanics, decreased knowledge of use of DME, vision, and UE functional use.  IMPAIRMENTS: are limiting patient from ADLs, IADLs, rest and sleep, work, and leisure.   CO-MORBIDITIES: has co-morbidities such as CHF, depression  that affects occupational performance. Patient will benefit from skilled OT to address above impairments and improve overall function.  MODIFICATION OR ASSISTANCE TO COMPLETE EVALUATION: No modification of tasks or assist necessary to complete an evaluation.  OT OCCUPATIONAL PROFILE AND HISTORY: Problem focused assessment: Including review of records relating to presenting problem.  CLINICAL DECISION MAKING: Moderate - several treatment options, min-mod task modification necessary  REHAB POTENTIAL: Good  EVALUATION COMPLEXITY: Moderate    PLAN:  OT FREQUENCY: 1-2x/week (pending any visit limitations with insurance), but would benefit from 2x/week  OT DURATION: 12 weeks  PLANNED INTERVENTIONS: self care/ADL training, therapeutic exercise, therapeutic activity, neuromuscular re-education, manual therapy, passive range of motion, balance training, moist heat, cryotherapy, patient/family education, coping strategies training, and DME and/or AE instructions  RECOMMENDED OTHER SERVICES: OT recommended pt follow up with her eye doctor as she does report some increased blurred vision since her CVA  CONSULTED AND AGREED WITH PLAN OF CARE: Patient  PLAN FOR NEXT SESSION: see above  Danelle Earthly, MS, OTR/L  Otis Dials, OT 03/20/2023, 1:27 PM

## 2023-03-21 ENCOUNTER — Ambulatory Visit: Payer: Medicaid Other | Attending: Nurse Practitioner | Admitting: Nurse Practitioner

## 2023-03-21 ENCOUNTER — Encounter: Payer: Self-pay | Admitting: Nurse Practitioner

## 2023-03-21 VITALS — BP 130/88 | HR 80 | Ht 60.0 in | Wt 152.8 lb

## 2023-03-21 DIAGNOSIS — R072 Precordial pain: Secondary | ICD-10-CM

## 2023-03-21 DIAGNOSIS — R0600 Dyspnea, unspecified: Secondary | ICD-10-CM | POA: Diagnosis not present

## 2023-03-21 DIAGNOSIS — Z7689 Persons encountering health services in other specified circumstances: Secondary | ICD-10-CM | POA: Diagnosis not present

## 2023-03-21 NOTE — Patient Instructions (Addendum)
Medication Instructions:  Your physician recommends that you continue on your current medications as directed. Please refer to the Current Medication list given to you today.  *If you need a refill on your cardiac medications before your next appointment, please call your pharmacy*  Lab Work: -None ordered If you have labs (blood work) drawn today and your tests are completely normal, you will receive your results only by: MyChart Message (if you have MyChart) OR A paper copy in the mail If you have any lab test that is abnormal or we need to change your treatment, we will call you to review the results.  Testing/Procedures: Your provider has ordered a Lexiscan Myoview Stress test. This will take place at [facility]. Please report to the [facility] medical mall entrance. The volunteers at the first desk will direct you where to go.  [facility] MYOVIEW  Your provider has ordered a Stress Test with nuclear imaging. The purpose of this test is to evaluate the blood supply to your heart muscle. This procedure is referred to as a "Non-Invasive Stress Test." This is because other than having an IV started in your vein, nothing is inserted or "invades" your body. Cardiac stress tests are done to find areas of poor blood flow to the heart by determining the extent of coronary artery disease (CAD). Some patients exercise on a treadmill, which naturally increases the blood flow to your heart, while others who are unable to walk on a treadmill due to physical limitations will have a pharmacologic/chemical stress agent called Lexiscan . This medicine will mimic walking on a treadmill by temporarily increasing your coronary blood flow.   Please note: these test may take anywhere between 2-4 hours to complete  How to prepare for your Myoview test:  Nothing to eat for 6 hours prior to the test No caffeine for 24 hours prior to test No smoking 24 hours prior to test. Your medication may be taken with water.  If your doctor  stopped a medication because of this test, do not take that medication. Ladies, please do not wear dresses.  Skirts or pants are appropriate. Please wear a short sleeve shirt. No perfume, cologne or lotion. Wear comfortable walking shoes. No heels!   PLEASE NOTIFY THE OFFICE AT LEAST 24 HOURS IN ADVANCE IF YOU ARE UNABLE TO KEEP YOUR APPOINTMENT.  [phone] AND  PLEASE NOTIFY NUCLEAR MEDICINE AT [facility] AT LEAST 24 HOURS IN ADVANCE IF YOU ARE UNABLE TO KEEP YOUR APPOINTMENT. [phone]   Follow-Up: At [facility], you and your health needs are our priority.  As part of our continuing mission to provide you with exceptional heart care, we have created designated Provider Care Teams.  These Care Teams include your primary Cardiologist (physician) and Advanced Practice Providers (APPs -  Physician Assistants and Nurse Practitioners) who all work together to provide you with the care you need, when you need it.  Your next appointment:   2 month(s)  Provider:   You may see Debbe Odea, MD or one of the following Advanced Practice Providers on your designated Care Team:   Nicolasa Ducking, NP  Other Instructions -None

## 2023-03-21 NOTE — Progress Notes (Signed)
Office Visit    Patient Name: Anna Tapia Date of Encounter: 03/21/2023  Primary Care Provider:  Margarita Mail, DO Primary Cardiologist:  Debbe Odea, MD  Chief Complaint    32 y/o ? 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  Past Medical History    Past Medical History:  Diagnosis Date   Arteriovenous malformation of brain    a. s/p remote coiling.   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   Congenital CHF (congestive heart failure) (HCC)    a. 03/2023 Echo: EF 60-65%, no rwma, nl RV fxn, RVSP 29.29mmHg. Mild MR. Mild Ao sclerosis w/o stenosis. Ao root 38mm.   Hemorrhagic stroke (HCC)    a. L-sided midbrain, thalamic ICH in setting of PCA/ACA aneurysm s/p embolization.   Hydrocephalus (HCC)    a. s/p VP shunt in childhood.   Past Surgical History:  Procedure Laterality Date   VENTRICULOPERITONEAL SHUNT      Allergies  No Known Allergies  History of Present Illness    32 y/o ? 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  She was previously seen 4/17 due to dyspnea and underwent an echo, which showed normal LV function with mild MR and borderline dilation of the aortic root at 38 mm.  Today, she reports that she has been experiencing chest discomfort that occurs predominantly at rest at least twice a week if not more.  Symptoms have been occurring in this fashion for several months.  There are no associated symptoms and symptoms typically resolve within a few minutes.  She notes that she was previously seen in the emergency department for similar discomfort and was diagnosed with chest  wall pain however, pain is no worse with upper body movements, palpation, or deep breathing.  Her dyspnea on exertion has been stable.  Activity overall is limited due to chronic right-sided hemiparesis.  She denies palpitations, PND, orthopnea, dizziness, syncope, edema, or early satiety.  Home Medications    Current Outpatient Medications  Medication Sig Dispense Refill   acetaminophen (TYLENOL) 325 MG tablet Take 650 mg by mouth every 6 (six) hours as needed.     albuterol (VENTOLIN HFA) 108 (90 Base) MCG/ACT inhaler Inhale 2 puffs into the lungs every 6 (six) hours as needed for wheezing or shortness of breath. 8 g 0   baclofen (LIORESAL) 10 MG tablet Take 1 tablet (10 mg total) by mouth 2 (two) times daily. (Patient taking differently: Take 15 mg by mouth 1 day or 1 dose. 10 mg in the am and 15 mg at night) 60 tablet 3   dibucaine (NUPERCAINAL) 1 % OINT Place 1 application  rectally 3 (three) times daily as needed for hemorrhoids. 30 g 0   famotidine (PEPCID) 20 MG tablet Take 1 tablet (20 mg total) by mouth 2 (two) times daily. 60 tablet 0   FLUoxetine (PROZAC) 10 MG capsule Take 10 mg by mouth daily.     fluticasone-salmeterol (ADVAIR) 100-50 MCG/ACT AEPB Inhale 1 puff into the lungs 2 (two) times daily. 60 each 3   pregabalin (LYRICA) 50 MG capsule Take 50 mg by mouth 2 (  two) times daily.     No current facility-administered medications for this visit.     Review of Systems    Persistent and stable dyspnea on exertion.  Complains of a several month history of intermittent chest pain today.  Denies palpitations, PND, orthopnea, dizziness, syncope, edema, or early satiety.  All other systems reviewed and are otherwise negative except as noted above.    Physical Exam    VS:  BP 130/88 (BP Location: Left Arm, Patient Position: Sitting, Cuff Size: Normal)   Pulse 80   Ht 5' (1.524 m)   Wt 152 lb 12.8 oz (69.3 kg)   SpO2 98%   BMI 29.84 kg/m  , BMI Body mass index is 29.84 kg/m.      GEN: Well nourished, well developed, in no acute distress. HEENT: normal. Neck: Supple, no JVD, carotid bruits, or masses. Cardiac: RRR, no murmurs, rubs, or gallops. No clubbing, cyanosis, edema.  Radials 2+/PT 2+ and equal bilaterally.  Respiratory:  Respirations regular and unlabored, clear to auscultation bilaterally. GI: Soft, nontender, nondistended, BS + x 4. MS: no deformity or atrophy. Skin: warm and dry, no rash. Neuro: Right hemiparesis. Psych: Normal affect.  Accessory Clinical Findings    ECG personally reviewed by me today -regular sinus rhythm, 80, left atrial enlargement, delayed R wave progression- no acute changes.  Lab Results  Component Value Date   WBC 6.1 11/19/2022   HGB 13.0 11/19/2022   HCT 39.7 11/19/2022   MCV 87.8 11/19/2022   PLT 415 (H) 11/19/2022   Lab Results  Component Value Date   CREATININE 0.71 11/19/2022   BUN 7 11/19/2022   NA 140 11/19/2022   K 4.5 11/19/2022   CL 105 11/19/2022   CO2 26 11/19/2022   Lab Results  Component Value Date   ALT 13 11/19/2022   AST 17 11/19/2022   ALKPHOS 53 07/24/2022   BILITOT 0.3 11/19/2022   Lab Results  Component Value Date   CHOL 206 (H) 11/19/2022   HDL 70 11/19/2022   LDLCALC 121 (H) 11/19/2022   TRIG 63 11/19/2022   CHOLHDL 2.9 11/19/2022    Assessment & Plan    1.  Precordial chest pain: Patient with a several month history of precordial chest discomfort without associated symptoms occurring at least twice a week, lasting few minutes, and resolving spontaneously.  She also has a history of dyspnea on exertion with recent echocardiogram showed normal LV function without significant valvular disease.  ECG is unremarkable.  We discussed potential options for evaluating chest pain.  She is unable to walk on a treadmill in the setting of right hemiparesis.  Will arrange for a Lexiscan Myoview to rule out ischemia.  2.  Dyspnea on exertion: Overall stable.  Recent echocardiogram showed normal  LV function without regional motion abnormalities.  Suspect deconditioning is playing a role though in the setting of chest pain, will arrange for [facility].  3.  History of hemorrhagic stroke: In the setting of AVM and aneurysms.  Chronic right hemiparesis.  Overall stable.  4.  Disposition: Follow-up stress testing.  Follow-up in clinic in 1 to 2 months or sooner if necessary.   Nicolasa Ducking, NP 03/21/2023, 5:03 PM

## 2023-03-24 ENCOUNTER — Ambulatory Visit: Payer: Medicaid Other

## 2023-03-24 DIAGNOSIS — R262 Difficulty in walking, not elsewhere classified: Secondary | ICD-10-CM

## 2023-03-24 DIAGNOSIS — R2681 Unsteadiness on feet: Secondary | ICD-10-CM

## 2023-03-24 DIAGNOSIS — M6281 Muscle weakness (generalized): Secondary | ICD-10-CM | POA: Diagnosis not present

## 2023-03-24 DIAGNOSIS — R2689 Other abnormalities of gait and mobility: Secondary | ICD-10-CM

## 2023-03-24 DIAGNOSIS — R278 Other lack of coordination: Secondary | ICD-10-CM | POA: Diagnosis not present

## 2023-03-24 DIAGNOSIS — Z7689 Persons encountering health services in other specified circumstances: Secondary | ICD-10-CM | POA: Diagnosis not present

## 2023-03-24 NOTE — Therapy (Addendum)
OUTPATIENT PHYSICAL THERAPY TREATMENT/  PHYSICAL THERAPY    Patient Name: Anna Tapia MRN: [id number] DOB:Oct 14, 1991, 32 y.o., female Today's Date: 03/24/2023   PCP: Margarita Mail, DO  REFERRING PROVIDER: Margarita Mail, DO   END OF SESSION:   PT End of Session - 03/24/23 1506     Visit Number 21    Number of Visits 31    Date for PT Re-Evaluation 04/28/23    Authorization Type [place] MEDICA[employer]PAID HE[employer] PLAN    Authorization Time Period 12/26/22-03/20/23    Progress Note Due on Visit 20    PT Start Time 1515    PT Stop Time 1559    PT Time Calculation (min) 44 min    Equipment Utilized During Treatment Gait belt    Activity Tolerance Patient tolerated treatment well;No increased pain    Behavior During Therapy WFL for tasks assessed/performed                 Past Medical History:  Diagnosis Date   Arteriovenous malformation of brain    a. s/p remote coiling.   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   Congenital CHF (congestive heart failure) (HCC)    a. 03/2023 Echo: EF 60-65%, no rwma, nl RV fxn, RVSP 29.60mmHg. Mild MR. Mild Ao sclerosis w/o stenosis. Ao root 38mm.   Hemorrhagic stroke (HCC)    a. L-sided midbrain, thalamic ICH in setting of PCA/ACA aneurysm s/p embolization.   Hydrocephalus (HCC)    a. s/p VP shunt in childhood.   Past Surgical History:  Procedure Laterality Date   VENTRICULOPERITONEAL SHUNT     Patient Active Problem List   Diagnosis Date Noted   Major depressive disorder, recurrent episode, moderate (HCC) 02/07/2022   Sepsis secondary to UTI (HCC) 05/01/2020   VP (ventriculoperitoneal) shunt status 05/01/2020   Right ovarian cyst 05/01/2020   Elevated LFTs 05/01/2020   Vaginal spotting 05/01/2020    ONSET DATE: 07/24/2022  REFERRING DIAG: I69.30 (ICD-10-CM) - History of CVA with residual deficit  THERAPY DIAG:  Difficulty in walking, not elsewhere classified  Muscle  weakness (generalized)  Unsteadiness on feet  Other abnormalities of gait and mobility  Rationale for Evaluation and Treatment: Rehabilitation  SUBJECTIVE:                                                                                                                                                                                              SUBJECTIVE STATEMENT:  Patient reports her hand is hurting her. No other complaints.       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  PAIN:  Are you having pain? Yes see above  RUE   PRECAUTIONS: None  WEIGHT BEARING RESTRICTIONS: No  FALLS: Has patient fallen in last 6 months? Yes. Number of falls 1, during the stroke and fell in the shower.  PATIENT GOALS: Get more mobility in the arm and less pain in the leg.  OBJECTIVE:   OBJECTIVE:  taken at eval unless otherwise stated.    DIAGNOSTIC FINDINGS:      CLINICAL DATA:  Initial evaluation for intracranial hemorrhage.   EXAM: CT ANGIOGRAPHY HEAD AND NECK    IMPRESSION: 1. 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 Again, prominent arterial contribution to the AVM is seen from the left ACA and posterior circulation, with primary venous drainage into the deep venous system. Associated 7 mm aneurysm along the right anterolateral aspect of the AVM as above. 2. Diffuse tortuosity and ectasia elsewhere about the major arterial vasculature of  the head and neck. No large vessel occlusion or hemodynamically significant stenosis. No other acute vascular abnormality.   COGNITION: Overall cognitive status: Within functional limits for tasks assessed             SENSATION: WFL   COORDINATION: Pt limited with quick movements during supination/pronation of the wrist on the R side and also with thumb to finger movements.   POSTURE: rounded shoulders   LOWER EXTREMITY ROM:      Active  Right Eval Left Eval  Hip flexion      Hip extension      Hip abduction      Hip adduction      Hip internal rotation      Hip external rotation      Knee flexion      Knee extension      Ankle dorsiflexion      Ankle plantarflexion      Ankle inversion      Ankle eversion       (Blank rows = not tested)   LOWER EXTREMITY MMT:     MMT Right Eval Left Eval  Hip flexion 4 4+  Hip abduction 4 4+  Hip adduction 4 4-  Knee flexion 4- 4+  Knee extension 4+ 4+  Ankle dorsiflexion 4 4+  (Blank rows = not tested)     TRANSFERS: Assistive device utilized: None  Sit to stand: Complete Independence Stand to sit: Complete Independence Chair to chair: Complete Independence   GAIT: Gait pattern: step through pattern, decreased arm swing- Right, decreased step length- Left, decreased stance time- Right, decreased stride length, decreased hip/knee flexion- Right, decreased ankle dorsiflexion- Right, and circumduction- Right Distance walked: 40' Assistive device utilized: None Level of assistance: Complete Independence Comments: Pt able to ambulate safely without AD.   FUNCTIONAL TESTS:  5 times sit to stand: 12/26/22 15.44 sec. 01/30/23: 11.51 sec  5/13: 11.68sec    Timed up and go (TUG): 15.65 sec 01/30/23: 10.99  sec (average of 2 trials)  5/13: 11.03  6 minute walk test: 619' 2 standing rest breaks 01/30/23 924' no AD and no rest breaks 5/13: 962ft  10 meter walk test: 15.46 sec 01/30/23: normal:13.06sec(.36m/s) and fast:  11.7(.36m/s) 5/13: 11.03sec (0.50m/s)  Functional gait assessment: 15/30. 01/30/23:  18/30 5/13:18/30   PATIENT SURVEYS:  FOTO 48/58/ 01/30/23: 51 5/13: 46     TODAY'S TREATMENT:                                                                                                                              DATE: 03/24/23  Nustep level 3 x 3 min level 4 x1 min level 1 x 1 min  Cues for attention to the RUE to allow full ROM and reduce spasticity.    Activity Description: red=right hand/foot, green =left hand/foot ; 3 on floor 3 on table  Activity Setting:  The Blaze Pod Random setting was chosen to enhance cognitive processing and agility, providing an unpredictable environment to simulate real-world scenarios, and fostering quick reactions and adaptability.   Number of Pods:  6 Cycles/Sets:  3 Duration (Time or Hit Count):  30 seconds  Patient Stats   Hits:   10, 18, 21  Activity Description: one per stair, forward, backwards tap lit up pod, close CGA  Activity Setting:  The Blaze Pod Random setting was chosen to enhance cognitive processing and agility, providing an unpredictable environment to simulate real-world scenarios, and fostering quick reactions and adaptability.   Number of Pods:  6 Cycles/Sets:  3 Duration (Time or Hit Count):  10    Blaze pod. Random setting, 3 pods, sitting 5 inch step, 45 sec. Performed 2 x 45 sec bil therapeutic rest break between bouts. Pt required min assist when standing on RLE to improve weight shift and cues to maintain terminal knee extension on the R.   Standing next to support bar: Incline static stand 60 seconds  Gait training with 3# ankle weights x 14ft with supervision assist and cues for pelvic rotation to improve symmerty R vs L.  ; superset with 10x STS ; 2 sets total   Seated: Bilateral Eversion 15x Hamstring curl RTB 15x each LE  Lateral step over hedgehog and back 10x each LE            PATIENT EDUCATION: Education  details: pt educated throughout session about proper posture and technique with exercises. Improved exercise technique, movement at target joints, use of target muscles after min to mod verbal, visual, tactile cues.  Person educated: Patient Education method: Explanation Education comprehension: verbalized understanding  HOME EXERCISE PROGRAM:   Access Code: [email or web address] URL: [email or web address] Date: 03/17/2023 Prepared by: Grier Rocher  Exercises - Standing Tandem Balance with Counter Support  - 1 x daily - 7 x weekly - 3 sets - 10 reps - Standing Single Leg Stance with Counter Support  - 1 x daily - 7 x weekly - 3 sets -  10 reps - Backward Walking with Counter Support  - 1 x daily - 7 x weekly - 3 sets - 10 reps - Side Stepping with Counter Support  - 1 x daily - 7 x weekly - 3 sets - 10 reps  Access Code: [email or web address] URL: [email or web address] Date: 01/07/2023 Prepared by: Grier Rocher  Exercises - Staggered Sit-to-Stand with BUE push from RLE - 1 x daily - 7 x weekly - 3 sets - 5 reps  Access Code: [email or web address] URL: [email or web address] Date: 01/22/2023 Prepared by: Grier Rocher  Exercises - Seated Figure 4 Piriformis Stretch  - 1 x daily - 7 x weekly - 2 sets - 3 reps - 60 sec hold   PT instructed pt in HEP  Lateral lunge. X 5 bil and UE supported on rail Standing hip flexor stretch x 20 sec bil  Piriformis stretch x 1 min with increasing over pressure with trunk flexion Sit<>stand pushing from RLE x 10 .  Hip abduction x 10 BLE Hip extension x 10 BLE  Cues for posture and proper set up to reduced compensation on the RLE through trunk   GOALS: Goals reviewed with patient? Yes  SHORT TERM GOALS: Target date: 01/23/2023  Pt will be independent with HEP in order to demonstrate increased ability to perform tasks related to occupation/hobbies. Baseline: Pt not given HEP at initial evaluation. Goal status: MET   LONG TERM  GOALS: Target date: 03/20/2023  1.  Patient (> 23 years old) will complete five times sit to stand test in < 15 seconds indicating an increased LE strength and improved balance. Baseline: 15.44 sec. 3/28 11.51 sec. 5/13: 11.68sec   Goal status: MET   2.  Patient will increase FOTO score to equal to or greater than 58 to demonstrate statistically significant improvement in mobility and quality of life.  Baseline: 48 01/30/23: 51 5/13: 46  Goal status: Progressing    3.  Patient will increase FGA score by > 4 points to demonstrate decreased fall risk during functional activities. Baseline: 15/30. 01/30/23:  18/30. 5/13: 11.68sec   Goal status: Progressing    4.  Patient will reduce timed up and go to <11 seconds to reduce fall risk and demonstrate improved transfer/gait ability. Baseline: 15.65 sec. 01/30/23: 10.99 sec 5/13: 11.03 Goal status:  MET   5.  Patient will increase 10 meter walk test to >1.29m/s as to improve gait speed for better community ambulation and to reduce fall risk. Baseline: 15.46 sec. 01/30/23: normal:13.06sec(.66m/s)  5/13: 11.03sec (0.27m/s) Goal status: progressing   6.  Patient will increase six minute walk test distance to >1000 for progression to community ambulator and improve gait ability Baseline: 79' with 2 standing rest breaks, no AD. 01/30/23 924' no AD and no rest breaks  5/13: 933ft Goal status: progressing     ASSESSMENT:  CLINICAL IMPRESSION: Patient fatigues with supersest of sit to stands and weighted ambulation. She is able to tolerate ambulation without LOB with occasional cues for neutral alignment. Patient presents with good motivation.  Inconsistent ability to self correct R foot inversion noted throughout session. Pt will continue to benefit from skilled physical therapy intervention to address impairments, improve QOL, and attain therapy goals.      OBJECTIVE IMPAIRMENTS: Abnormal gait, decreased activity tolerance, decreased balance,  decreased endurance, decreased mobility, difficulty walking, decreased ROM, decreased strength, and pain.   ACTIVITY LIMITATIONS: carrying, lifting, bending, standing, squatting, sleeping, bathing, toileting, reach over head, hygiene/grooming, and locomotion level  PARTICIPATION LIMITATIONS: meal prep,  cleaning, laundry, driving, shopping, community activity, and yard work  PERSONAL FACTORS: Age, Past/current experiences, Social background, Time since onset of injury/illness/exacerbation, Transportation, and 3+ comorbidities: asthma, depression, CHF  are also affecting patient's functional outcome.   REHAB POTENTIAL: Good  CLINICAL DECISION MAKING: Stable/uncomplicated  EVALUATION COMPLEXITY: Moderate  PLAN:  PT FREQUENCY: 1-2x/week, preferably 2x/week but due to insurance, may only be able to be seen 1x/week  PT DURATION: 12 weeks  PLANNED INTERVENTIONS: Therapeutic exercises, Therapeutic activity, Neuromuscular re-education, Balance training, Gait training, Patient/Family education, Self Care, Joint mobilization, Joint manipulation, Stair training, Vestibular training, Canalith repositioning, Orthotic/Fit training, DME instructions, Aquatic Therapy, Dry Needling, Electrical stimulation, Spinal manipulation, Spinal mobilization, Cryotherapy, Moist heat, Taping, Manual therapy, and Re-evaluation   PLAN FOR NEXT SESSION:   High intensity gait and balance training. Endurance training. Coordination and spasticity management     Precious Bard PT ,DPT Physical Therapist- [facility]  [facility]   4:01 PM 03/24/23

## 2023-03-25 ENCOUNTER — Ambulatory Visit (LOCAL_COMMUNITY_HEALTH_CENTER): Payer: Medicaid Other | Admitting: Family Medicine

## 2023-03-25 VITALS — BP 140/86 | HR 82 | Ht 60.0 in | Wt 151.4 lb

## 2023-03-25 DIAGNOSIS — Z113 Encounter for screening for infections with a predominantly sexual mode of transmission: Secondary | ICD-10-CM

## 2023-03-25 DIAGNOSIS — Z7689 Persons encountering health services in other specified circumstances: Secondary | ICD-10-CM | POA: Diagnosis not present

## 2023-03-25 DIAGNOSIS — Z309 Encounter for contraceptive management, unspecified: Secondary | ICD-10-CM

## 2023-03-25 DIAGNOSIS — I509 Heart failure, unspecified: Secondary | ICD-10-CM | POA: Insufficient documentation

## 2023-03-25 DIAGNOSIS — Z01419 Encounter for gynecological examination (general) (routine) without abnormal findings: Secondary | ICD-10-CM | POA: Diagnosis not present

## 2023-03-25 LAB — HM PAP SMEAR: HM Pap smear: NEGATIVE

## 2023-03-25 LAB — HM HIV SCREENING LAB: HM HIV Screening: NEGATIVE

## 2023-03-25 LAB — WET PREP FOR TRICH, YEAST, CLUE
Trichomonas Exam: NEGATIVE
Yeast Exam: NEGATIVE

## 2023-03-25 NOTE — Progress Notes (Addendum)
Wet mount results reviewed, no treatment required per standing order.  Condoms declined.

## 2023-03-25 NOTE — Progress Notes (Signed)
[facility] [address] Main Number: [phone]  Family Planning Visit- Repeat Yearly Visit  Subjective:  Anna Tapia is a 32 y.o. G0P0000  being seen today for an annual wellness visit and to discuss contraception options.   The patient is currently using Abstinence for pregnancy prevention. Patient does not want a pregnancy in the next year.    report they are looking for a method that provides Discrete method   Patient has the following medical problems: has Sepsis secondary to UTI [facility]); VP (ventriculoperitoneal) shunt status; Right ovarian cyst; Elevated LFTs; Major depressive disorder, recurrent episode, moderate (HCC); AVM (arteriovenous malformation) brain; CHF (congestive heart failure) (HCC); Left-sided nontraumatic intracerebral hemorrhage (HCC); and Asthma on their problem list.  Chief Complaint  Patient presents with   Gynecologic Exam    Pap, PE   Exposure to STD    Routine screening    Patient reports to clinic for PE and Pap smear   Patient denies concerns about self   See flowsheet for other program required questions.   Body mass index is 29.57 kg/m. - Patient is eligible for diabetes screening based on BMI> 25 and age >35?  no HA1C ordered? not applicable  Patient reports 1 of partners in last year. Desires STI screening?  Yes   Has patient been screened once for HCV in the past?  No   Lab Results  Component Value Date   HCVAB NON REACTIVE 05/02/2020    Does the patient have current of drug use, have a partner with drug use, and/or has been incarcerated since last result? No  If yes-- Screen for HCV through [facility]   Does the patient meet criteria for HBV testing? No  Criteria:  -Household, sexual or needle sharing contact with HBV -History of drug use -HIV positive -Those with known Hep C   Health Maintenance Due  Topic Date Due   COVID-19 Vaccine (1) Never done    PAP SMEAR-Modifier  01/22/2023    Review of Systems  Reason unable to perform ROS: hemorroids, weight loss.  Constitutional:  Negative for weight loss.  Eyes:  Positive for blurred vision.  Respiratory:  Positive for shortness of breath. Negative for cough.   Cardiovascular:  Negative for claudication.  Gastrointestinal:  Negative for nausea.  Genitourinary:  Negative for dysuria and frequency.  Skin:  Negative for rash.  Neurological:  Positive for headaches.  Endo/Heme/Allergies:  Does not bruise/bleed easily.    The following portions of the patient's history were reviewed and updated as appropriate: allergies, current medications, past family history, past medical history, past social history, past surgical history and problem list. Problem list updated.  Objective:   Vitals:   03/25/23 1427  BP: (!) 140/86  Pulse: 82  Weight: 151 lb 6.4 oz (68.7 kg)  Height: 5' (1.524 m)    Physical Exam Vitals and nursing note reviewed.  Constitutional:      Appearance: Normal appearance.  HENT:     Head: Normocephalic and atraumatic.     Mouth/Throat:     Mouth: Mucous membranes are moist.     Pharynx: Oropharynx is clear. No oropharyngeal exudate or posterior oropharyngeal erythema.  Pulmonary:     Effort: Pulmonary effort is normal.  Abdominal:     General: Abdomen is flat.     Palpations: There is no mass.     Tenderness: There is no abdominal tenderness. There is no rebound.  Genitourinary:  General: Normal vulva.     Exam position: Lithotomy position.     Pubic Area: No rash or pubic lice.      Labia:        Right: No rash or lesion.        Left: No rash or lesion.      Vagina: Normal. No vaginal discharge, erythema, bleeding or lesions.     Cervix: No cervical motion tenderness, discharge, friability, lesion or erythema.     Uterus: Normal.      Adnexa: Right adnexa normal and left adnexa normal.     Rectum: Normal.     Comments: pH = 4 Lymphadenopathy:      Head:     Right side of head: No preauricular or posterior auricular adenopathy.     Left side of head: No preauricular or posterior auricular adenopathy.     Cervical: No cervical adenopathy.     Upper Body:     Right upper body: No supraclavicular, axillary or epitrochlear adenopathy.     Left upper body: No supraclavicular, axillary or epitrochlear adenopathy.     Lower Body: No right inguinal adenopathy. No left inguinal adenopathy.  Skin:    General: Skin is warm and dry.     Findings: No rash.  Neurological:     Mental Status: She is alert and oriented to person, place, and time.       Assessment and Plan:  Anna Tapia is a 32 y.o. female G0P0000 presenting to the [facility] Department for an yearly wellness and contraception visit   Contraception counseling: Reviewed options based on patient desire and reproductive life plan. Patient is interested in Abstinence. This was provided to the patient today.   Risks, benefits, and typical effectiveness rates were reviewed.  Questions were answered.  Written information was also given to the patient to review.    The patient will follow up in  1 years for surveillance.  The patient was told to call with any further questions, or with any concerns about this method of contraception.  Emphasized use of condoms 100% of the time for STI prevention.  Patient was assessed for need for ECP. Not indicated   1. Well woman exam with routine gynecological exam CBE due in 2025 -repeat pap today -reports HA, SOB, rectal bleeding (due to hemorrhoids) and blurred vision -is being seen by other Dr for SOB due to CHF -she states she is being followed by other physicians for her other concerns- she had a stroke in the last year which has lead to some symptoms -declined BCM today- abstinent   - IGP, Aptima HPV  2. Screening for venereal disease  - WET PREP FOR TRICH, YEAST, CLUE - Chlamydia/Gonorrhea [place] [facility] - HIV [place]  [facility] - Syphilis Serology, [facility]   Return in about 1 year (around 03/24/2024) for annual well-woman exam.  Future Appointments  Date Time Provider Department Center  03/26/2023  2:30 PM Otis Dials, OT [facility] None  03/26/2023  3:15 PM Golden Pop, PT [facility] None  04/01/2023  8:30 AM [facility]  04/01/2023  2:30 PM Thresa Ross B, PT [facility] None  04/03/2023  2:30 PM Golden Pop, PT [facility] None  04/03/2023  3:15 PM Otis Dials, OT [facility] None  04/07/2023  2:30 PM Golden Pop, PT [facility] None  04/09/2023 11:00 AM Thresa Ross B, PT [facility] None  04/09/2023 11:45 AM Caprice Kluver Sabino Snipes, OT  [facility] None  04/14/2023  2:30 PM Grier Rocher E, PT [facility] None  04/16/2023  2:30 PM Thresa Ross B, PT [facility] None  04/16/2023  3:15 PM Otis Dials, OT [facility] None  04/21/2023  2:30 PM Golden Pop, PT [facility] None  04/23/2023  2:30 PM Otis Dials, OT [facility] None  04/23/2023  3:15 PM Golden Pop, PT [facility] None  04/28/2023  3:15 PM Golden Pop, PT [facility] None  04/30/2023  2:30 PM Otis Dials, OT [facility] None  04/30/2023  3:15 PM Norman Herrlich, PT [facility] None  05/26/2023  1:20 PM Margarita Mail, DO [facility]  06/11/2023  3:35 PM Creig Hines, NP [facility] None    Lenice Llamas, FNP

## 2023-03-25 NOTE — Progress Notes (Signed)
Pt appointment for PE, pap, and STI screening. Seen by FNP Sydnee Levans. Family planning packet given and contents reviewed. Wet prep results reviewed.

## 2023-03-26 ENCOUNTER — Ambulatory Visit: Payer: Medicaid Other

## 2023-03-26 DIAGNOSIS — R278 Other lack of coordination: Secondary | ICD-10-CM

## 2023-03-26 DIAGNOSIS — M6281 Muscle weakness (generalized): Secondary | ICD-10-CM

## 2023-03-26 DIAGNOSIS — R262 Difficulty in walking, not elsewhere classified: Secondary | ICD-10-CM

## 2023-03-26 DIAGNOSIS — R2681 Unsteadiness on feet: Secondary | ICD-10-CM | POA: Diagnosis not present

## 2023-03-26 DIAGNOSIS — R2689 Other abnormalities of gait and mobility: Secondary | ICD-10-CM | POA: Diagnosis not present

## 2023-03-26 DIAGNOSIS — Z7689 Persons encountering health services in other specified circumstances: Secondary | ICD-10-CM | POA: Diagnosis not present

## 2023-03-26 NOTE — Therapy (Signed)
OUTPATIENT PHYSICAL THERAPY TREATMENT    Patient Name: Anna Tapia MRN: [id number] DOB:1991/07/04, 32 y.o., female Today's Date: 03/26/2023   PCP: Margarita Mail, DO  REFERRING PROVIDER: Margarita Mail, DO   END OF SESSION:   PT End of Session - 03/26/23 1521     Visit Number 22    Number of Visits 31    Date for PT Re-Evaluation 04/28/23    Authorization Type [place] MEDICA[employer] PREPAID HEALTH PLAN    Progress Note Due on Visit 30    PT Start Time 1515    PT Stop Time 1555    PT Time Calculation (min) 40 min    Equipment Utilized During Treatment Gait belt    Activity Tolerance Patient tolerated treatment well;No increased pain    Behavior During Therapy WFL for tasks assessed/performed                 Past Medical History:  Diagnosis Date   Arteriovenous malformation of brain    a. s/p remote coiling.   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   Congenital CHF (congestive heart failure) (HCC)    a. 03/2023 Echo: EF 60-65%, no rwma, nl RV fxn, RVSP 29.14mmHg. Mild MR. Mild Ao sclerosis w/o stenosis. Ao root 38mm.   COVID 2022   Hemorrhagic stroke (HCC) 2023   a. L-sided midbrain, thalamic ICH in setting of PCA/ACA aneurysm s/p embolization.   History of broken collarbone 2020   Hydrocephalus (HCC)    a. s/p VP shunt in childhood.   Sepsis secondary to UTI (HCC) 2021   Past Surgical History:  Procedure Laterality Date   VENTRICULOPERITONEAL SHUNT     Patient Active Problem List   Diagnosis Date Noted   CHF (congestive heart failure) (HCC) 03/25/2023   Left-sided nontraumatic intracerebral hemorrhage (HCC) 08/01/2022   AVM (arteriovenous malformation) brain 07/24/2022   Asthma 07/24/2022   Major depressive disorder, recurrent episode, moderate (HCC) 02/07/2022   Sepsis secondary to UTI (HCC) 05/01/2020   VP (ventriculoperitoneal) shunt status 05/01/2020   Right ovarian cyst 05/01/2020   Elevated LFTs  05/01/2020    ONSET DATE: 07/24/2022  REFERRING DIAG: I69.30 (ICD-10-CM) - History of CVA with residual deficit  THERAPY DIAG:  Difficulty in walking, not elsewhere classified  Muscle weakness (generalized)  Unsteadiness on feet  Other abnormalities of gait and mobility  Other lack of coordination  Balance disorder  Rationale for Evaluation and Treatment: Rehabilitation  SUBJECTIVE:  SUBJECTIVE STATEMENT:  Patient reports her hand is hurting her. Recently increased medication dose for this, not noticing any improvement in pain.     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 Yes see above  RUE , Rt leg 7-8/10; RUE 8/10.   PRECAUTIONS: None  WEIGHT BEARING RESTRICTIONS: No  FALLS: Has patient fallen in last 6 months? Yes. Number of falls 1, during the stroke and fell in the shower.  PATIENT GOALS: Get more mobility in the arm and less pain in the leg.  OBJECTIVE:     TODAY'S TREATMENT:                                                                                                                              DATE: 03/26/23  -STS from chair x10  -AMB 146ft c 3lb AW bilat -STS from chair x10 c orange physioball  -AMB 186ft c 3lb AW bilat -STS from chair x10 c orange physioball    -load transfer 11 objects 1 at a time from 2 counter height surfaces: objects 0-10lbs, hands use ad lib 180 degree turnaround between stations (5lb AW on bilat anklez)  *seated recovery  -load transfer 11 objects 1 at a time from 2 cabinet height surface to  low cabinet surface height: objects 0-10lbs, hands use ad lib 180 degree turnaround between stations (5lb AW on bilat anklez)  *seated recovery  -load pull AMB at cable tower with baseball bat handle  -2 lengths c 7.5lb weight     PATIENT EDUCATION: Education details: pt educated throughout session about proper posture and technique with exercises. Improved exercise technique, movement at target joints, use of target muscles after min to mod verbal, visual, tactile cues.  Person educated: Patient Education method: Explanation Education comprehension: verbalized understanding  HOME EXERCISE PROGRAM:   Access Code: [email or web address] URL: [email or web address] Date: 03/17/2023 Prepared by: Grier Rocher  Exercises - Standing Tandem Balance with Counter Support  - 1 x daily - 7 x weekly - 3 sets - 10 reps - Standing Single Leg Stance with Counter Support  - 1 x daily - 7 x weekly - 3 sets - 10 reps - Backward Walking with Counter Support  - 1 x daily - 7 x weekly - 3 sets - 10 reps - Side Stepping with Counter Support  - 1 x daily - 7 x weekly - 3 sets - 10 reps  Access Code: [email or web address] URL: [email or web address] Date: 01/07/2023 Prepared by: Grier Rocher  Exercises - Staggered Sit-to-Stand with BUE push from RLE - 1 x daily - 7 x weekly - 3 sets - 5 reps  Access Code: [email or web address] URL: [email or web address] Date: 01/22/2023 Prepared by: Grier Rocher  Exercises -  Seated Figure 4 Piriformis Stretch  - 1 x daily - 7 x weekly - 2 sets - 3 reps - 60 sec hold   PT instructed pt in HEP  Lateral lunge. X 5 bil and UE supported on rail Standing hip flexor stretch x 20 sec bil  Piriformis stretch x 1 min with increasing over pressure with trunk flexion Sit<>stand pushing from RLE x 10 .  Hip abduction x 10 BLE Hip extension x 10 BLE  Cues for posture and proper set up to reduced compensation on the RLE through trunk   GOALS: Goals reviewed with  patient? Yes  SHORT TERM GOALS: Target date: 01/23/2023  Pt will be independent with HEP in order to demonstrate increased ability to perform tasks related to occupation/hobbies. Baseline: Pt not given HEP at initial evaluation. Goal status: MET   LONG TERM GOALS: Target date: 03/20/2023  1.  Patient (> 32 years old) will complete five times sit to stand test in < 15 seconds indicating an increased LE strength and improved balance. Baseline: 15.44 sec. 3/28 11.51 sec. 5/13: 11.68sec   Goal status: MET   2.  Patient will increase FOTO score to equal to or greater than 58 to demonstrate statistically significant improvement in mobility and quality of life.  Baseline: 48 01/30/23: 51 5/13: 46  Goal status: Progressing    3.  Patient will increase FGA score by > 4 points to demonstrate decreased fall risk during functional activities. Baseline: 15/30. 01/30/23:  18/30. 5/13: 11.68sec   Goal status: Progressing    4.  Patient will reduce timed up and go to <11 seconds to reduce fall risk and demonstrate improved transfer/gait ability. Baseline: 15.65 sec. 01/30/23: 10.99 sec 5/13: 11.03 Goal status:  MET   5.  Patient will increase 10 meter walk test to >1.6m/s as to improve gait speed for better community ambulation and to reduce fall risk. Baseline: 15.46 sec. 01/30/23: normal:13.06sec(.96m/s)  5/13: 11.03sec (0.17m/s) Goal status: progressing   6.  Patient will increase six minute walk test distance to >1000 for progression to community ambulator and improve gait ability Baseline: 57' with 2 standing rest breaks, no AD. 01/30/23 924' no AD and no rest breaks  5/13: 963ft Goal status: progressing     ASSESSMENT:  CLINICAL IMPRESSION: Great motivation. Great effort. Task based IADL, ADL training with weights to create challenges to postural stability. Pt will continue to benefit from skilled physical therapy intervention to address impairments, improve QOL, and attain therapy goals.       OBJECTIVE IMPAIRMENTS: Abnormal gait, decreased activity tolerance, decreased balance, decreased endurance, decreased mobility, difficulty walking, decreased ROM, decreased strength, and pain.   ACTIVITY LIMITATIONS: carrying, lifting, bending, standing, squatting, sleeping, bathing, toileting, reach over head, hygiene/grooming, and locomotion level  PARTICIPATION LIMITATIONS: meal prep, cleaning, laundry, driving, shopping, community activity, and yard work  PERSONAL FACTORS: Age, Past/current experiences, Social background, Time since onset of injury/illness/exacerbation, Transportation, and 3+ comorbidities: asthma, depression, CHF  are also affecting patient's functional outcome.   REHAB POTENTIAL: Good  CLINICAL DECISION MAKING: Stable/uncomplicated  EVALUATION COMPLEXITY: Moderate  PLAN:  PT FREQUENCY: 1-2x/week, preferably 2x/week but due to insurance, may only be able to be seen 1x/week  PT DURATION: 12 weeks  PLANNED INTERVENTIONS: Therapeutic exercises, Therapeutic activity, Neuromuscular re-education, Balance training, Gait training, Patient/Family education, Self Care, Joint mobilization, Joint manipulation, Stair training, Vestibular training, Canalith repositioning, Orthotic/Fit training, DME instructions, Aquatic Therapy, Dry Needling, Electrical stimulation, Spinal manipulation, Spinal mobilization, Cryotherapy, Moist heat, Taping,  Manual therapy, and Re-evaluation   PLAN FOR NEXT SESSION:   High intensity gait and balance training. Endurance training. Coordination and spasticity management   4:04 PM, 03/26/23 Rosamaria Lints, PT, DPT Physical Therapist - [facility]  [phone] (ASCOM)    Rosamaria Lints PT ,DPT Physical Therapist- [facility]  [facility]   3:23 PM 03/26/23

## 2023-03-30 NOTE — Therapy (Addendum)
OUTPATIENT OCCUPATIONAL THERAPY NEURO PROGRESS AND TREATMENT NOTE Reporting period beginning 01/15/23-03/26/23  Patient Name: Anna Tapia MRN: [id number] DOB:[birth year], 32 y.o., female Today's Date: 03/30/2023  PCP: Dr. Margarita Mail REFERRING PROVIDER: Dr. Margarita Mail  END OF SESSION:  OT End of Session - 03/30/23 1451     Visit Number 10    Number of Visits 24    Date for OT Re-Evaluation 04/09/23    Progress Note Due on Visit 10    OT Start Time 1430    OT Stop Time 1515    OT Time Calculation (min) 45 min    Activity Tolerance Patient tolerated treatment well    Behavior During Therapy [facility] for tasks assessed/performed            Past Medical History:  Diagnosis Date   Arteriovenous malformation of brain    a. s/p remote coiling.   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   Congenital CHF (congestive heart failure) (HCC)    a. 03/2023 Echo: EF 60-65%, no rwma, nl RV fxn, RVSP 29.16mmHg. Mild MR. Mild Ao sclerosis w/o stenosis. Ao root 38mm.   COVID 2022   Hemorrhagic stroke (HCC) 2023   a. L-sided midbrain, thalamic ICH in setting of PCA/ACA aneurysm s/p embolization.   History of broken collarbone 2020   Hydrocephalus (HCC)    a. s/p VP shunt in childhood.   Sepsis secondary to UTI (HCC) 2021   Past Surgical History:  Procedure Laterality Date   VENTRICULOPERITONEAL SHUNT     Patient Active Problem List   Diagnosis Date Noted   CHF (congestive heart failure) (HCC) 03/25/2023   Left-sided nontraumatic intracerebral hemorrhage (HCC) 08/01/2022   AVM (arteriovenous malformation) brain 07/24/2022   Asthma 07/24/2022   Major depressive disorder, recurrent episode, moderate (HCC) 02/07/2022   Sepsis secondary to UTI (HCC) 05/01/2020   VP (ventriculoperitoneal) shunt status 05/01/2020   Right ovarian cyst 05/01/2020   Elevated LFTs 05/01/2020   ONSET DATE: Sept 2023;   REFERRING DIAG:  I63.9 (ICD-10-CM) -  CVA (cerebral vascular accident) (HCC)   THERAPY DIAG:  Muscle weakness (generalized)  Other lack of coordination  Rationale for Evaluation and Treatment: Rehabilitation  SUBJECTIVE:  SUBJECTIVE STATEMENT: Pt reports she hasn't been able to tell a difference yet with the increase in her Baclofen the other day. Pt accompanied by: self and aunt  PERTINENT HISTORY: Per Dr. Caralee Ates on 11/19/22: HPI Anna Tapia presents to establish care.  She is here with her mom.   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       PRECAUTIONS: None  WEIGHT BEARING RESTRICTIONS: No  PAIN:  Are you having pain? Yes: NPRS scale: 6/10 Pain location: Entire R side, arm and leg Pain description: pins and needles in the hand, arm is achy, moving the arm causes stabbing pain Aggravating factors: movement of the arm Relieving factors: rest; pt reports medication is minimally effective  FALLS: Has patient fallen in last 6 months? Yes. 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  Being independent."  OBJECTIVE:   HAND DOMINANCE:  Left  ADLs: Overall ADLs: mostly performed with the LUE Transfers/ambulation related to ADLs: indep/extra time Eating: cuts food 1 handed Grooming: 1 handed (L) UB Dressing: occasional help to don clothes if tired after a shower; help with straightening clothes; struggles with clothing fasteners LB Dressing: wearing slip on shoes; difficulty with clothing fasteners  Toileting: indep Bathing: modified indep; pt makes attempts to engage the R arm while bathing but reports this is difficult Tub Shower transfers: modified indep Equipment:  see above  IADLs: Shopping: pt can go shopping accompanied by a family member Light housekeeping: 1 handed Meal Prep: cooking causes fatigue; mostly L handed Community mobility: uses rollator for community mobility; uses [employer] transportation Medication management: uses weekly pill organizer; Engineer, materials: mother manages (pt states she doesn't really have any bills)  Handwriting:  N/A (pt is L hand dominant)  MOBILITY STATUS:  ambulatory without AD; pt seeing PT for LE strengthening and balance deficits  POSTURE COMMENTS:  rounded shoulders Sitting balance: Moves/returns truncal midpoint >2 inches in all planes  ACTIVITY TOLERANCE: Activity tolerance: Pain in the R side significantly limits activity tolerance.  Pt reports she can get fatigued with ADLs and IADLs.  FUNCTIONAL OUTCOME MEASURES: FOTO: 44; 48 03/26/23: 48  UPPER EXTREMITY ROM:    Active ROM Right eval Right 03/26/23 Left Eval WNL  Shoulder flexion 38 (60) 88 (118)   Shoulder abduction     Shoulder adduction 42 (51) 85 (105)   Shoulder extension     Shoulder internal rotation  R thumb to posterior hip   Shoulder external rotation Hand to back of head (scaption and chin tuck) Hand to back of head (minimal scaption and minimal chin tuck)   Elbow flexion WNL    Elbow extension WNL    Wrist flexion WNL    Wrist extension WNL    Wrist ulnar deviation     Wrist  radial deviation     Wrist pronation WNL    Wrist supination WNL    (Blank rows = not tested)  R hand: Able to oppose each digit to thumb with extra time and able to make full composite fist  UPPER EXTREMITY MMT:     MMT Right eval Right 03/26/23 Left Eval 5/5  Shoulder flexion 3- 3-   Shoulder abduction 3- 3-   Shoulder adduction     Shoulder extension     Shoulder internal rotation 3+ 3+   Shoulder external rotation 3+ 3+   Middle trapezius     Lower trapezius     Elbow flexion 4- 4-   Elbow extension 4 4   Wrist flexion 4- 4-   Wrist extension 3+ 4-   Wrist ulnar deviation     Wrist radial deviation     Wrist pronation     Wrist supination     (Blank rows = not tested)  HAND FUNCTION: Grip strength: Right: 4 lbs; Left: 50 lbs, Lateral pinch: Right:  0 lbs, Left: 17 lbs, and 3 point pinch: Right: 0 lbs, Left: 19 lbs Grip strength: Right: 18 lbs, Lateral pinch: Right 10 lbs, 3 point pinch: Right 6 lbs  COORDINATION: Finger Nose Finger test: able to touch nose with increased time 9 Hole Peg test: Right: unable (picked up a peg with repeat trials but unable to move the peg from horizontal to vertical position to place into board) sec; Left: 24 sec 03/26/23: 5 min 11 sec  SENSATION: tingling in the R hand  Light touch: Impaired  Proprioception: WFL  EDEMA: N/A  MUSCLE TONE: RUE: Mild and Hypertonic  COGNITION: Overall cognitive status: Within functional limits for tasks assessed  VISION: Subjective report: Pt reports increased blurred vision following her stroke Baseline vision: Wears glasses all the time  VISION ASSESSMENT: Ocular ROM: WFL Gaze preference/alignment: gaze left; slight Tracking/Visual pursuits: Able to track stimulus in all quads without difficulty Saccades: WFL Visual Fields: no apparent deficits  PERCEPTION: WFL  PRAXIS: Impaired: Motor planning  OBSERVATIONS: Pt guards RUE with flexed elbow and shoulder IR/hand to abdomen during  mobility.  R scapular elevation noting increased tension in R upper trap.  TODAY'S TREATMENT:        Moist heat to R shoulder applied intermittently throughout session for pain management/muscle relaxation during simultaneously completion of stretching and neuro re-ed activities noted below.                                                                                                                        Therapeutic Exercise: Objective measures taken and goals updated for progress note. Performed passive stretching throughout the RUE, including all planes for R shoulder in prep for neuro re-ed activities.  Completed 3 sets 10 reps of hand gripper with light resistance (2 red bands) for increasing grip strength; min vc to increase range with flexion/extension of digits with each rep.  Instructed pt in cane stretches with 1.5# dowel for increasing strength and ROM throughout the RUE.  Utilized Ship broker for visual cues to increase symmetry.  Pt completed 1 set 10 reps for chest press, shoulder press, shoulder flex, ER to top of head, abd, and IR and extension behind back.  Handouts issued for carry over at home.    Neuro re-ed: Facilitated R hand FMC/dexterity skills and forward and lateral reaching patterns working to place and remove ball pegs from pegboard.  Pt practiced manipulation of pegs using a 2 point, 3 point and 5 finger grasp.   PATIENT EDUCATION: Education details: R hand FMC/dexterity skills Person educated: Patient Education method: Explanation and Verbal cues, demo, tactile cues Education comprehension: able to return demo.   HOME EXERCISE PROGRAM: Soft blue theraputty exercises  GOALS: Goals reviewed with patient? Yes  SHORT TERM GOALS: Target date: 02/26/23 (6 weeks)  Pt will be indep to perform HEP for increasing RUE flexibility, strength, and coordination to better engage the RUE into daily tasks. Baseline: Not yet initiated; 03/26/23: using putty and engaging the R  arm as  able into daily tasks. Goal status: ongoing  LONG TERM GOALS: Target date: 04/09/23 (12 weeks)  Pt will increase FOTO score to 48 or better to indicate clinically relevant improvement in self perceived use of R arm for daily tasks.  Baseline: Eval: 44; 03/26/23: 48 Goal status: achieved/ongoing  2.  Pt will increase R active shoulder flexion to 100* or better to achieve functional ROM for UB ADLs and reaching for ADL supplies. Baseline: Eval: R 38 degrees (passive 60); pt reaches during ADLs only with the L arm; 1/47/82: R 88* (passive 118). Goal status: ongoing  3.  Pt will increase R grip strength by 10 or more lbs to enable use of R hand to hold and carry light ADL supplies. Baseline: Eval: R grip 4 lbs, L 50 lbs; 03/26/23: R grip 18 lbs (pt still uses L hand to hold and carry light ADL supplies) Goal status: ongoing  4.  Pt will report <4/10 pain throughout the RUE to enable improved tolerance with using RUE during daily tasks.  Baseline: Eval: 6-7/10 pain throughout the R side (pt predominantly uses L hand for ADLs); 03/26/23: 6/10 pain; pt demonstrating less sensitivity to touch and tolerates stretching throughout the RUE without jumping or grimacing.   Goal status: ongoing  5.  Pt will increase R hand dexterity/FMC skills to manipulate clothing fasteners with bilat hands. Baseline: Eval: pt avoids clothing fasteners or manages with extra time using L hand only (R 9 hole: unable, L 24 sec); 03/26/23: pt uses the R hand as a stabilizer with min vc (9 hole peg test 5 min 11 sec) Goal status: ongoing  ASSESSMENT:  CLINICAL IMPRESSION: Pt seen for 10th visit progress note.  Pt is making steady progress towards goals, noting improvements in both strength and coordination in the R hand.  R shoulder ROM is steadily improving and pt is tolerating stretch throughout the RUE well without jumping or grimacing.  Pt is consistent to make attempts at engaging the RUE into all ADLs.  FOTO score has  improved from 44 to 48.  Pt continues to present with mild spasticity throughout the RUE, weakness, and coordination deficits, all impacting ability to efficiently engage the R hand into ADLs which require [facility] skills or lifting/carrying Adl supplies.  Pt will continue to benefit from skilled OT to reduce R shoulder stiffness, improve RUE strength and coordination, and reduce pain throughout the RUE in order to maximize functional use of the RUE with daily tasks.   PERFORMANCE DEFICITS: in functional skills including ADLs, IADLs, coordination, dexterity, sensation, tone, ROM, strength, pain, flexibility, Fine motor control, Gross motor control, mobility, balance, body mechanics, decreased knowledge of use of DME, vision, and UE functional use.  IMPAIRMENTS: are limiting patient from ADLs, IADLs, rest and sleep, work, and leisure.   CO-MORBIDITIES: has co-morbidities such as CHF, depression  that affects occupational performance. Patient will benefit from skilled OT to address above impairments and improve overall function.  MODIFICATION OR ASSISTANCE TO COMPLETE EVALUATION: No modification of tasks or assist necessary to complete an evaluation.  OT OCCUPATIONAL PROFILE AND HISTORY: Problem focused assessment: Including review of records relating to presenting problem.  CLINICAL DECISION MAKING: Moderate - several treatment options, min-mod task modification necessary  REHAB POTENTIAL: Good  EVALUATION COMPLEXITY: Moderate    PLAN:  OT FREQUENCY: 1-2x/week (pending any visit limitations with insurance), but would benefit from 2x/week  OT DURATION: 12 weeks  PLANNED INTERVENTIONS: self care/ADL training, therapeutic exercise, therapeutic activity, neuromuscular  re-education, manual therapy, passive range of motion, balance training, moist heat, cryotherapy, patient/family education, coping strategies training, and DME and/or AE instructions  RECOMMENDED OTHER SERVICES: OT recommended pt  follow up with her eye doctor as she does report some increased blurred vision since her CVA  CONSULTED AND AGREED WITH PLAN OF CARE: Patient  PLAN FOR NEXT SESSION: see above  Danelle Earthly, MS, OTR/L  Otis Dials, OT 03/30/2023, 2:53 PM

## 2023-04-01 ENCOUNTER — Encounter
Admission: RE | Admit: 2023-04-01 | Discharge: 2023-04-01 | Disposition: A | Discharge status: Home / Self Care | Payer: Medicaid Other | Source: Ambulatory Visit | Attending: Nurse Practitioner | Admitting: Nurse Practitioner

## 2023-04-01 ENCOUNTER — Ambulatory Visit: Payer: Medicaid Other | Admitting: Physical Therapy

## 2023-04-01 DIAGNOSIS — R072 Precordial pain: Secondary | ICD-10-CM

## 2023-04-01 DIAGNOSIS — Z7689 Persons encountering health services in other specified circumstances: Secondary | ICD-10-CM | POA: Diagnosis not present

## 2023-04-01 LAB — NM MYOCAR MULTI W/SPECT W/WALL MOTION / EF
LV dias vol: 67 mL (ref 46–106)
Nuc Stress EF: 51 %

## 2023-04-01 MED ORDER — REGADENOSON 0.4 MG/5ML IV SOLN
0.4000 mg | Freq: Once | INTRAVENOUS | Status: AC
  Administered 2023-04-01: 0.4 mg via INTRAVENOUS

## 2023-04-01 MED ORDER — TECHNETIUM TC 99M TETROFOSMIN IV KIT
10.0000 | PACK | Freq: Once | INTRAVENOUS | Status: AC | PRN
  Administered 2023-04-01: 10.29 via INTRAVENOUS

## 2023-04-01 MED ORDER — TECHNETIUM TC 99M TETROFOSMIN IV KIT
30.5200 | PACK | Freq: Once | INTRAVENOUS | Status: AC | PRN
  Administered 2023-04-01: 30.52 via INTRAVENOUS

## 2023-04-02 LAB — NM MYOCAR MULTI W/SPECT W/WALL MOTION / EF
LV sys vol: 33 mL
Peak HR: 113 {beats}/min
Rest HR: 86 {beats}/min
Rest Nuclear Isotope Dose: 10.3 mCi
SDS: 0
SRS: 7
SSS: 4
ST Depression (mm): 0 mm
Stress Nuclear Isotope Dose: 30.5 mCi
TID: 1.04

## 2023-04-02 LAB — IGP, APTIMA HPV
HPV Aptima: NEGATIVE
PAP Smear Comment: 0

## 2023-04-03 ENCOUNTER — Ambulatory Visit: Payer: Medicaid Other

## 2023-04-03 ENCOUNTER — Ambulatory Visit: Payer: Medicaid Other | Admitting: Physical Therapy

## 2023-04-03 DIAGNOSIS — R278 Other lack of coordination: Secondary | ICD-10-CM

## 2023-04-03 DIAGNOSIS — M6281 Muscle weakness (generalized): Secondary | ICD-10-CM | POA: Diagnosis not present

## 2023-04-03 DIAGNOSIS — R262 Difficulty in walking, not elsewhere classified: Secondary | ICD-10-CM

## 2023-04-03 DIAGNOSIS — R2681 Unsteadiness on feet: Secondary | ICD-10-CM | POA: Diagnosis not present

## 2023-04-03 DIAGNOSIS — R2689 Other abnormalities of gait and mobility: Secondary | ICD-10-CM | POA: Diagnosis not present

## 2023-04-03 DIAGNOSIS — Z7689 Persons encountering health services in other specified circumstances: Secondary | ICD-10-CM | POA: Diagnosis not present

## 2023-04-03 NOTE — Therapy (Addendum)
OUTPATIENT PHYSICAL THERAPY TREATMENT / Discharge summary.    Patient Name: Anna Tapia MRN: [id number] DOB:02/25/1991, 32 y.o., female Today's Date: 04/03/2023   PCP: Margarita Mail, DO  REFERRING PROVIDER: Margarita Mail, DO   END OF SESSION:   PT End of Session - 04/03/23 1408     Visit Number 23    Number of Visits 31    Date for PT Re-Evaluation 04/28/23    Authorization Type  [employer]ICA[employer]PAID [employer]ALTH PLAN    Progress Note Due on Visit 30    PT Start Time 1432    PT Stop Time 1515    PT Time Calculation (min) 43 min    Equipment Utilized During Treatment Gait belt    Activity Tolerance Patient tolerated treatment well;No increased pain    Behavior During Therapy WFL for tasks assessed/performed                 Past Medical History:  Diagnosis Date   Arteriovenous malformation of brain    a. s/p remote coiling.   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   Congenital CHF (congestive heart failure) (HCC)    a. 03/2023 Echo: EF 60-65%, no rwma, nl RV fxn, RVSP 29.50mmHg. Mild MR. Mild Ao sclerosis w/o stenosis. Ao root 38mm.   COVID 2022   Hemorrhagic stroke (HCC) 2023   a. L-sided midbrain, thalamic ICH in setting of PCA/ACA aneurysm s/p embolization.   History of broken collarbone 2020   Hydrocephalus (HCC)    a. s/p VP shunt in childhood.   Sepsis secondary to UTI (HCC) 2021   Past Surgical History:  Procedure Laterality Date   VENTRICULOPERITONEAL SHUNT     Patient Active Problem List   Diagnosis Date Noted   CHF (congestive heart failure) (HCC) 03/25/2023   Left-sided nontraumatic intracerebral hemorrhage (HCC) 08/01/2022   AVM (arteriovenous malformation) brain 07/24/2022   Asthma 07/24/2022   Major depressive disorder, recurrent episode, moderate (HCC) 02/07/2022   Sepsis secondary to UTI (HCC) 05/01/2020   VP (ventriculoperitoneal) shunt status 05/01/2020   Right ovarian cyst 05/01/2020    Elevated LFTs 05/01/2020    ONSET DATE: 07/24/2022  REFERRING DIAG: I69.30 (ICD-10-CM) - History of CVA with residual deficit  THERAPY DIAG:  Difficulty in walking, not elsewhere classified  Muscle weakness (generalized)  Unsteadiness on feet  Other abnormalities of gait and mobility  Other lack of coordination  Balance disorder  Rationale for Evaluation and Treatment: Rehabilitation  SUBJECTIVE:  SUBJECTIVE STATEMENT:  States that she has been feeling "weak" today. No other medical updates.       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 Yes see above  RUE , Rt leg 7-8/10; RUE 8/10.   PRECAUTIONS: None  WEIGHT BEARING RESTRICTIONS: No  FALLS: Has patient fallen in last 6 months? Yes. Number of falls 1, during the stroke and fell in the shower.  PATIENT GOALS: Get more mobility in the arm and less pain in the leg.  OBJECTIVE:     TODAY'S TREATMENT:                                                                                                                              DATE: 04/03/23  Patient demonstrates increased fall risk as noted by score of 20/30 on  Functional Gait Assessment.   <22/30 = predictive of falls, <20/30 = fall in 6 months, <18/30 = predictive of falls in PD MCID: 5 points stroke population, 4 points geriatric population (ANPTA Core Set of Outcome Measures for Adults with Neurologic Conditions, 2018)  Dynamic gait training with 3 pound ankle weights.  Navigating obstacles in floor, 3 1/2 bolsters, 8 cones  stepping up and across 4 inch aerobic step sidestepping 10 feet right and left.  Performed through 150 feet x 3 bouts.  Patient noted to have improved step length with obstacle navigation. no loss of balance noted.   Reciprocal foot tap on 6 inch step without upper extremity support 2 x 8 bilateral Lateral foot taps on 6 inch step without upper extremity support 2 x 8 bilateral Intermittent upper extremity support required to prevent loss of balance with lateral foot tap.  Cues for use of hip strategy to prevent loss of balance  Pt performed 5 time sit<>stand (5xSTS): 13.77 sec (>15 sec indicates increased fall risk) performed without upper extremity support.     PATIENT EDUCATION: Education details: pt educated throughout session about proper posture and technique with exercises. Improved exercise technique, movement at target joints, use of target muscles after min to mod verbal, visual, tactile cues.  Person educated: Patient Education method: Explanation Education comprehension: verbalized understanding  HOME EXERCISE PROGRAM:   Access Code: [email or web address] URL: [email or web address] Date: 03/17/2023 Prepared by: Grier Rocher  Exercises - Standing Tandem Balance with Counter Support  - 1 x daily - 7 x weekly - 3 sets - 10 reps - Standing Single Leg Stance with Counter Support  - 1 x daily - 7 x weekly - 3 sets - 10 reps - Backward Walking with Counter Support  - 1 x daily - 7 x weekly - 3 sets - 10 reps - Side Stepping with Counter Support  -  1 x daily - 7 x weekly - 3 sets - 10 reps  Access Code: [email or web address] URL: [email or web address] Date: 01/07/2023 Prepared by: Grier Rocher  Exercises - Staggered Sit-to-Stand with BUE push from RLE - 1 x daily - 7 x weekly - 3 sets - 5 reps  Access Code: [email or web address] URL: [email or web address] Date: 01/22/2023 Prepared by: Grier Rocher  Exercises - Seated Figure 4 Piriformis Stretch  - 1 x daily - 7 x  weekly - 2 sets - 3 reps - 60 sec hold   PT instructed pt in HEP  Lateral lunge. X 5 bil and UE supported on rail Standing hip flexor stretch x 20 sec bil  Piriformis stretch x 1 min with increasing over pressure with trunk flexion Sit<>stand pushing from RLE x 10 .  Hip abduction x 10 BLE Hip extension x 10 BLE  Cues for posture and proper set up to reduced compensation on the RLE through trunk   GOALS: Goals reviewed with patient? Yes  SHORT TERM GOALS: Target date: 01/23/2023  Pt will be independent with HEP in order to demonstrate increased ability to perform tasks related to occupation/hobbies. Baseline: Pt not given HEP at initial evaluation. Goal status: MET   LONG TERM GOALS: Target date: 03/20/2023  1.  Patient (> 51 years old) will complete five times sit to stand test in < 15 seconds indicating an increased LE strength and improved balance. Baseline: 15.44 sec. 3/28 11.51 sec. 5/13: 11.68sec  5/30.  13.77 seconds Goal status: MET   2.  Patient will increase FOTO score to equal to or greater than 58 to demonstrate statistically significant improvement in mobility and quality of life.  Baseline: 48 01/30/23: 51 5/13: 46  Goal status: not MET   3.  Patient will increase FGA score by > 4 points to demonstrate decreased fall risk during functional activities. Baseline: 15/30. 01/30/23:  18/30. 5/13: 18 5/30:20/30 Goal status: MET Progressing    4.  Patient will reduce timed up and go to <11 seconds to reduce fall risk and demonstrate improved transfer/gait ability. Baseline: 15.65 sec. 01/30/23: 10.99 sec 5/13: 11.03 Goal status:  MET   5.  Patient will increase 10 meter walk test to >1.53m/s as to improve gait speed for better community ambulation and to reduce fall risk. Baseline: 15.46 sec. 01/30/23: normal:13.06sec(.71m/s)  5/13: 11.03sec (0.1m/s) Goal status: not MET  6.  Patient will increase six minute walk test distance to >1000 for progression to community  ambulator and improve gait ability Baseline: 8' with 2 standing rest breaks, no AD. 01/30/23 924' no AD and no rest breaks  5/13: 983ft Goal status: MET     ASSESSMENT:  CLINICAL IMPRESSION: Patient presents to PT session reporting increased generalized weakness.  Despite weakness patient puts forth excellent effort towards therapeutic interventions.  Patient demonstrates decreased fall risk with improved score on FGA improved from 15 initially to 20 out of 30 on this day.  Mild regression in 5 time sit to stand to 13.77 seconds.   Noted to have improved stability on dynamic gait training with increased step length and decreased loss of balance while navigating simulated community environment.  Pt will continue to benefit from skilled physical therapy intervention via [facility]      OBJECTIVE IMPAIRMENTS: Abnormal gait, decreased activity tolerance, decreased balance, decreased endurance, decreased mobility, difficulty walking, decreased ROM, decreased strength, and pain.   ACTIVITY LIMITATIONS: carrying, lifting, bending, standing, squatting, sleeping, bathing, toileting, reach over head, hygiene/grooming,  and locomotion level  PARTICIPATION LIMITATIONS: meal prep, cleaning, laundry, driving, shopping, community activity, and yard work  PERSONAL FACTORS: Age, Past/current experiences, Social background, Time since onset of injury/illness/exacerbation, Transportation, and 3+ comorbidities: asthma, depression, CHF  are also affecting patient's functional outcome.   REHAB POTENTIAL: Good  CLINICAL DECISION MAKING: Stable/uncomplicated  EVALUATION COMPLEXITY: Moderate  PLAN:  PT FREQUENCY: 1-2x/week, preferably 2x/week but due to insurance, may only be able to be seen 1x/week  PT DURATION: 12 weeks  PLANNED INTERVENTIONS: Therapeutic exercises, Therapeutic activity, Neuromuscular re-education, Balance training, Gait training, Patient/Family education, Self Care,  Joint mobilization, Joint manipulation, Stair training, Vestibular training, Canalith repositioning, Orthotic/Fit training, DME instructions, Aquatic Therapy, Dry Needling, Electrical stimulation, Spinal manipulation, Spinal mobilization, Cryotherapy, Moist heat, Taping, Manual therapy, and Re-evaluation     Golden Pop PT ,DPT Physical Therapist- [place]  [facility]   2:35 PM 04/03/23

## 2023-04-04 NOTE — Therapy (Signed)
OUTPATIENT OCCUPATIONAL THERAPY NEURO TREATMENT NOTE  Patient Name: Anna Tapia MRN: [id number] DOB:1991/02/13, 32 y.o., female Today's Date: 04/04/2023  PCP: Dr. Margarita Mail REFERRING PROVIDER: Dr. Margarita Mail  END OF SESSION:  OT End of Session - 04/04/23 2126     Visit Number 11    Number of Visits 24    Date for OT Re-Evaluation 04/09/23    Progress Note Due on Visit 10    OT Start Time 1515    OT Stop Time 1600    OT Time Calculation (min) 45 min    Activity Tolerance Patient tolerated treatment well    Behavior During Therapy [facility] for tasks assessed/performed            Past Medical History:  Diagnosis Date   Arteriovenous malformation of brain    a. s/p remote coiling.   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   Congenital CHF (congestive heart failure) (HCC)    a. 03/2023 Echo: EF 60-65%, no rwma, nl RV fxn, RVSP 29.19mmHg. Mild MR. Mild Ao sclerosis w/o stenosis. Ao root 38mm.   COVID 2022   Hemorrhagic stroke (HCC) 2023   a. L-sided midbrain, thalamic ICH in setting of PCA/ACA aneurysm s/p embolization.   History of broken collarbone 2020   Hydrocephalus (HCC)    a. s/p VP shunt in childhood.   Sepsis secondary to UTI (HCC) 2021   Past Surgical History:  Procedure Laterality Date   VENTRICULOPERITONEAL SHUNT     Patient Active Problem List   Diagnosis Date Noted   CHF (congestive heart failure) (HCC) 03/25/2023   Left-sided nontraumatic intracerebral hemorrhage (HCC) 08/01/2022   AVM (arteriovenous malformation) brain 07/24/2022   Asthma 07/24/2022   Major depressive disorder, recurrent episode, moderate (HCC) 02/07/2022   Sepsis secondary to UTI (HCC) 05/01/2020   VP (ventriculoperitoneal) shunt status 05/01/2020   Right ovarian cyst 05/01/2020   Elevated LFTs 05/01/2020   ONSET DATE: Sept 2023;   REFERRING DIAG:  I63.9 (ICD-10-CM) - CVA (cerebral vascular accident) (HCC)   THERAPY DIAG:   Muscle weakness (generalized)  Other lack of coordination  Rationale for Evaluation and Treatment: Rehabilitation  SUBJECTIVE:  SUBJECTIVE STATEMENT: Pt reports she hasn't been able to tell a difference yet with the increase in her Baclofen the other day. Pt accompanied by: self and aunt  PERTINENT HISTORY: Per Dr. Caralee Ates on 11/19/22: HPI Anna Tapia presents to establish care.  She is here with her mom.   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       PRECAUTIONS: None  WEIGHT BEARING RESTRICTIONS: No  PAIN:  Are you having pain? Yes: NPRS scale: 6/10 Pain location: Entire R side, arm and leg Pain description: pins and needles in the hand, arm is achy, moving the arm causes stabbing pain Aggravating factors: movement of the arm Relieving factors: rest; pt reports medication is minimally effective  FALLS: Has patient fallen in last 6 months? Yes. 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  Being independent."  OBJECTIVE:   HAND DOMINANCE: Left  ADLs: Overall ADLs: mostly performed with the  LUE Transfers/ambulation related to ADLs: indep/extra time Eating: cuts food 1 handed Grooming: 1 handed (L) UB Dressing: occasional help to don clothes if tired after a shower; help with straightening clothes; struggles with clothing fasteners LB Dressing: wearing slip on shoes; difficulty with clothing fasteners  Toileting: indep Bathing: modified indep; pt makes attempts to engage the R arm while bathing but reports this is difficult Tub Shower transfers: modified indep Equipment:  see above  IADLs: Shopping: pt can go shopping accompanied by a family member Light housekeeping: 1 handed Meal Prep: cooking causes fatigue; mostly L handed Community mobility: uses rollator for community mobility; uses [employer] transportation Medication management: uses weekly pill organizer; Engineer, materials: mother manages (pt states she doesn't really have any bills)  Handwriting:  N/A (pt is L hand dominant)  MOBILITY STATUS:  ambulatory without AD; pt seeing PT for LE strengthening and balance deficits  POSTURE COMMENTS:  rounded shoulders Sitting balance: Moves/returns truncal midpoint >2 inches in all planes  ACTIVITY TOLERANCE: Activity tolerance: Pain in the R side significantly limits activity tolerance.  Pt reports she can get fatigued with ADLs and IADLs.  FUNCTIONAL OUTCOME MEASURES: FOTO: 44; 48  UPPER EXTREMITY ROM:    Active ROM Right eval Left Eval WNL  Shoulder flexion 38 (60)   Shoulder abduction    Shoulder adduction 42 (51)   Shoulder extension    Shoulder internal rotation    Shoulder external rotation Hand to back of head (scaption and chin tuck)   Elbow flexion WNL   Elbow extension WNL   Wrist flexion WNL   Wrist extension WNL   Wrist ulnar deviation    Wrist radial deviation    Wrist pronation WNL   Wrist supination WNL   (Blank rows = not tested)  R hand: Able to oppose each digit to thumb with extra time and able to make full composite  fist  UPPER EXTREMITY MMT:     MMT Right eval Left Eval 5/5  Shoulder flexion 3-   Shoulder abduction 3-   Shoulder adduction    Shoulder extension    Shoulder internal rotation 3+   Shoulder external rotation 3+   Middle trapezius    Lower trapezius    Elbow flexion 4-   Elbow extension 4   Wrist flexion 4-   Wrist extension 3+   Wrist ulnar deviation    Wrist radial deviation    Wrist pronation    Wrist supination    (Blank rows = not tested)  HAND FUNCTION: Grip strength: Right: 4 lbs; Left: 50 lbs, Lateral pinch: Right: 0 lbs, Left: 17 lbs, and 3 point pinch: Right: 0 lbs, Left: 19 lbs  COORDINATION: Finger Nose Finger test: able to touch nose with increased time 9 Hole Peg test: Right: unable (picked up a peg with repeat trials but unable to move the peg from horizontal to vertical position to place into board) sec; Left:  24 sec  SENSATION: tingling in the R hand  Light touch: Impaired  Proprioception: WFL  EDEMA: N/A  MUSCLE TONE: RUE: Mild and Hypertonic  COGNITION: Overall cognitive status: Within functional limits for tasks assessed  VISION: Subjective report: Pt reports increased blurred vision following her stroke Baseline vision: Wears glasses all the time  VISION ASSESSMENT: Ocular ROM: WFL Gaze preference/alignment: gaze left; slight Tracking/Visual pursuits: Able to track stimulus in all quads without difficulty Saccades: WFL Visual Fields: no apparent deficits  PERCEPTION: WFL  PRAXIS: Impaired: Motor planning  OBSERVATIONS: Pt guards RUE with flexed elbow and shoulder IR/hand to abdomen during mobility.  R scapular elevation noting increased tension in R upper trap.  TODAY'S TREATMENT:        Moist heat to R shoulder applied intermittently throughout session for pain management/muscle relaxation during simultaneously completion of stretching and neuro re-ed activities noted below.                                                                                                                         Therapeutic Exercise: Performed passive stretching throughout the RUE, including all planes for R shoulder in prep for neuro re-ed activities.  Completed 3 sets 10 reps of hand gripper with light resistance (2 red bands) for increasing grip strength; min vc to increase range with flexion/extension of digits with each rep.  Instructed pt in cane stretches with 1.5# dowel for increasing strength and ROM throughout the RUE.  Utilized Ship broker for visual cues to increase symmetry.  Pt completed 1 set 10 reps for chest press, shoulder press, shoulder flex, ER to top of head, abd, and IR and extension behind back.  Handouts issued for carry over at home.    Neuro re-ed: Facilitated R hand FMC/dexterity skills and forward and lateral reaching patterns working to place and remove ball pegs from pegboard.  Pt practiced manipulation of pegs using a 2 point, 3 point and 5 finger grasp.   PATIENT EDUCATION: Education details: R hand FMC/dexterity skills Person educated: Patient Education method: Explanation and Verbal cues, demo, tactile cues Education comprehension: able to return demo.   HOME EXERCISE PROGRAM: Soft blue theraputty exercises  GOALS: Goals reviewed with patient? Yes  SHORT TERM GOALS: Target date: 02/26/23 (6 weeks)  Pt will be indep to perform HEP for increasing RUE flexibility, strength, and coordination to better engage the RUE into daily tasks. Baseline: Not yet initiated Goal status: INITIAL  LONG TERM GOALS: Target date: 04/09/23 (12 weeks)  Pt will increase FOTO score to 48 or better to indicate clinically relevant improvement in self perceived use of R arm for daily tasks.  Baseline: Eval: 44  Goal status: INITIAL  2.  Pt will increase R active shoulder flexion to 100* or better to achieve functional ROM for UB ADLs and reaching for ADL supplies. Baseline: Eval: R 38 degrees (passive 60); pt reaches during ADLs only  with the L arm Goal status: INITIAL  3.  Pt  will increase R grip strength by 10 or more lbs to enable use of R hand to hold and carry light ADL supplies. Baseline: Eval: R grip 4 lbs, L 50 lbs Goal status: INITIAL  4.  Pt will report <4/10 pain throughout the RUE to enable improved tolerance with using RUE during daily tasks.  Baseline: Eval: 6-7/10 pain throughout the R side (pt predominantly uses L hand for ADLs) Goal status: INITIAL  5.  Pt will increase R hand dexterity/FMC skills to manipulate clothing fasteners with bilat hands. Baseline: Eval: pt avoids clothing fasteners or manages with extra time using L hand only (R 9 hole: unable, L 24 sec) Goal status: INITIAL  ASSESSMENT:  CLINICAL IMPRESSION: Pt seen for 10th visit progress note.  Pt reports she hasn't been able to tell a difference yet with the increase in her Baclofen, still noting 6/10 pain throughout the RUE this date.   Pt is making steady gains with engaging the RUE into daily tasks.  R hand [facility] skills are improving, noting pt able to pick up a ball peg today from non-skid surface and place into pegboard with 75% accuracy, as compared to previous visits when OT had to stabilize peg upright in OT' s hand for pt to grab from a secure position.  Initiated cane stretches today, with pt responded well to visual cues from mirror to increase symmetry.  RUE remains weak and pt tolerated 1 set 10 reps only using BUEs to lift a 1.5# dowel, requiring intermittent min A to achieve max end range for R shoulder planes.  Pt will continue to benefit from skilled OT to reduce R shoulder stiffness, improve RUE strength and coordination, and reduce pain throughout the RUE in order to maximize functional use of the RUE with daily tasks.   PERFORMANCE DEFICITS: in functional skills including ADLs, IADLs, coordination, dexterity, sensation, tone, ROM, strength, pain, flexibility, Fine motor control, Gross motor control, mobility, balance, body  mechanics, decreased knowledge of use of DME, vision, and UE functional use.  IMPAIRMENTS: are limiting patient from ADLs, IADLs, rest and sleep, work, and leisure.   CO-MORBIDITIES: has co-morbidities such as CHF, depression  that affects occupational performance. Patient will benefit from skilled OT to address above impairments and improve overall function.  MODIFICATION OR ASSISTANCE TO COMPLETE EVALUATION: No modification of tasks or assist necessary to complete an evaluation.  OT OCCUPATIONAL PROFILE AND HISTORY: Problem focused assessment: Including review of records relating to presenting problem.  CLINICAL DECISION MAKING: Moderate - several treatment options, min-mod task modification necessary  REHAB POTENTIAL: Good  EVALUATION COMPLEXITY: Moderate    PLAN:  OT FREQUENCY: 1-2x/week (pending any visit limitations with insurance), but would benefit from 2x/week  OT DURATION: 12 weeks  PLANNED INTERVENTIONS: self care/ADL training, therapeutic exercise, therapeutic activity, neuromuscular re-education, manual therapy, passive range of motion, balance training, moist heat, cryotherapy, patient/family education, coping strategies training, and DME and/or AE instructions  RECOMMENDED OTHER SERVICES: OT recommended pt follow up with her eye doctor as she does report some increased blurred vision since her CVA  CONSULTED AND AGREED WITH PLAN OF CARE: Patient  PLAN FOR NEXT SESSION: see above  Danelle Earthly, MS, OTR/L  Otis Dials, OT 04/04/2023, 9:27 PM

## 2023-04-05 DIAGNOSIS — Z419 Encounter for procedure for purposes other than remedying health state, unspecified: Secondary | ICD-10-CM | POA: Diagnosis not present

## 2023-04-06 NOTE — Therapy (Signed)
OUTPATIENT OCCUPATIONAL THERAPY NEURO TREATMENT NOTE  Patient Name: Anna Tapia MRN: [id number] DOB:[birth year], 32 y.o., female Today's Date: 04/06/2023  PCP: Dr. Margarita Mail REFERRING PROVIDER: Dr. Margarita Mail  END OF SESSION:   OT End of Session - 04/04/23 2126       Visit Number 11     Number of Visits 24     Date for OT Re-Evaluation 04/09/23     Progress Note Due on Visit 10     OT Start Time 1515     OT Stop Time 1600     OT Time Calculation (min) 45 min     Activity Tolerance Patient tolerated treatment well     Behavior During Therapy [facility] for tasks assessed/performed       Past Medical History:  Diagnosis Date   Arteriovenous malformation of brain    a. s/p remote coiling.   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   Congenital CHF (congestive heart failure) (HCC)    a. 03/2023 Echo: EF 60-65%, no rwma, nl RV fxn, RVSP 29.61mmHg. Mild MR. Mild Ao sclerosis w/o stenosis. Ao root 38mm.   COVID 2022   Hemorrhagic stroke (HCC) 2023   a. L-sided midbrain, thalamic ICH in setting of PCA/ACA aneurysm s/p embolization.   History of broken collarbone 2020   Hydrocephalus (HCC)    a. s/p VP shunt in childhood.   Sepsis secondary to UTI (HCC) 2021   Past Surgical History:  Procedure Laterality Date   VENTRICULOPERITONEAL SHUNT     Patient Active Problem List   Diagnosis Date Noted   CHF (congestive heart failure) (HCC) 03/25/2023   Left-sided nontraumatic intracerebral hemorrhage (HCC) 08/01/2022   AVM (arteriovenous malformation) brain 07/24/2022   Asthma 07/24/2022   Major depressive disorder, recurrent episode, moderate (HCC) 02/07/2022   Sepsis secondary to UTI (HCC) 05/01/2020   VP (ventriculoperitoneal) shunt status 05/01/2020   Right ovarian cyst 05/01/2020   Elevated LFTs 05/01/2020   ONSET DATE: Sept 2023;   REFERRING DIAG:  I63.9 (ICD-10-CM) - CVA (cerebral vascular accident) (HCC)   THERAPY DIAG:   Muscle weakness (generalized)  Other lack of coordination  Rationale for Evaluation and Treatment: Rehabilitation  SUBJECTIVE:  SUBJECTIVE STATEMENT: Pt reports doing well today. Pt accompanied by: self   PERTINENT HISTORY: Per Dr. Caralee Ates on 11/19/22: HPI [name] presents to establish care.  She is here with her mom.   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       PRECAUTIONS: None  WEIGHT BEARING RESTRICTIONS: No  PAIN:  Are you having pain? Yes: NPRS scale: 6/10 Pain location: Entire R side, arm and leg Pain description: pins and needles in the hand, arm is achy, moving the arm causes stabbing pain Aggravating factors: movement of the arm Relieving factors: rest; pt reports medication is minimally effective  FALLS: Has patient fallen in last 6 months? Yes. 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  Being independent."  OBJECTIVE:   HAND DOMINANCE: Left  ADLs: Overall ADLs: mostly performed with the LUE Transfers/ambulation related to ADLs: indep/extra time Eating: cuts food 1  handed Grooming: 1 handed (L) UB Dressing: occasional help to don clothes if tired after a shower; help with straightening clothes; struggles with clothing fasteners LB Dressing: wearing slip on shoes; difficulty with clothing fasteners  Toileting: indep Bathing: modified indep; pt makes attempts to engage the R arm while bathing but reports this is difficult Tub Shower transfers: modified indep Equipment:  see above  IADLs: Shopping: pt can go shopping accompanied by a family member Light housekeeping: 1 handed Meal Prep: cooking causes fatigue; mostly L handed Community mobility: uses rollator for community mobility; uses [employer] transportation Medication management: uses weekly pill organizer; Engineer, materials: mother manages (pt states she doesn't really have any bills)  Handwriting:  N/A (pt is L hand dominant)  MOBILITY STATUS:  ambulatory without AD; pt seeing PT for LE strengthening and balance deficits  POSTURE COMMENTS:  rounded shoulders Sitting balance: Moves/returns truncal midpoint >2 inches in all planes  ACTIVITY TOLERANCE: Activity tolerance: Pain in the R side significantly limits activity tolerance.  Pt reports she can get fatigued with ADLs and IADLs.  FUNCTIONAL OUTCOME MEASURES: FOTO: 44; 48 03/26/23: 48  UPPER EXTREMITY ROM:    Active ROM Right eval Right 03/26/23 Left Eval WNL  Shoulder flexion 38 (60) 88 (118)   Shoulder abduction     Shoulder adduction 42 (51) 85 (105)   Shoulder extension     Shoulder internal rotation  R thumb to posterior hip   Shoulder external rotation Hand to back of head (scaption and chin tuck) Hand to back of head (minimal scaption and minimal chin tuck)   Elbow flexion WNL    Elbow extension WNL    Wrist flexion WNL    Wrist extension WNL    Wrist ulnar deviation     Wrist radial deviation     Wrist pronation WNL    Wrist supination WNL    (Blank rows = not tested)  R hand: Able to oppose each digit to  thumb with extra time and able to make full composite fist  UPPER EXTREMITY MMT:     MMT Right eval Right 03/26/23 Left Eval 5/5  Shoulder flexion 3- 3-   Shoulder abduction 3- 3-   Shoulder adduction     Shoulder extension     Shoulder internal rotation 3+ 3+   Shoulder external rotation 3+ 3+   Middle trapezius     Lower trapezius     Elbow flexion 4- 4-   Elbow extension 4 4   Wrist flexion 4- 4-   Wrist extension 3+ 4-   Wrist ulnar deviation     Wrist radial deviation     Wrist pronation     Wrist supination     (Blank rows = not tested)  HAND FUNCTION: Grip strength: Right: 4 lbs; Left: 50 lbs, Lateral pinch: Right: 0 lbs, Left: 17 lbs, and 3 point pinch: Right: 0 lbs, Left: 19 lbs Grip strength:  Right: 18 lbs, Lateral pinch: Right 10 lbs, 3 point pinch: Right 6 lbs  COORDINATION: Finger Nose Finger test: able to touch nose with increased time 9 Hole Peg test: Right: unable (picked up a peg with repeat trials but unable to move the peg from horizontal to vertical position to place into board) sec; Left: 24 sec 03/26/23: 5 min 11 sec  SENSATION: tingling in the R hand  Light touch: Impaired  Proprioception: WFL  EDEMA: N/A  MUSCLE TONE: RUE: Mild and Hypertonic  COGNITION: Overall cognitive status: Within functional limits for tasks assessed  VISION: Subjective report: Pt reports increased blurred vision following her stroke Baseline vision: Wears glasses all the time  VISION ASSESSMENT: Ocular ROM: WFL Gaze preference/alignment: gaze left; slight Tracking/Visual pursuits: Able to track stimulus in all quads without difficulty Saccades: WFL Visual Fields: no apparent deficits  PERCEPTION: WFL  PRAXIS: Impaired: Motor planning  OBSERVATIONS: Pt guards RUE with flexed elbow and shoulder IR/hand to abdomen during mobility.  R scapular elevation noting increased tension in R upper trap.  TODAY'S TREATMENT:        Moist heat to R shoulder applied  intermittently throughout session for pain management/muscle relaxation during simultaneously completion of stretching and neuro re-ed activities noted below.                                                                                                                        Therapeutic Exercise: Performed passive stretching throughout the RUE, including all planes for R shoulder in prep for neuro re-ed activities.    Neuro re-ed: Facilitated R hand FMC/dexterity skills and forward and lateral reaching patterns working to place and remove jumbo pegs from pegboard.  Pt practiced manipulation of pegs using a 2 point, 3 point and 5 finger grasp.  Pt was able to pick up pegs from table top and move from horizontal position to vertical position into pegboard by pronating and supinating wrist d/t lacking digit isolation skills required to reposition pegs within fingertips.   PATIENT EDUCATION: Education details: R hand FMC/dexterity skills Person educated: Patient Education method: Explanation and Verbal cues, demo, tactile cues Education comprehension: able to return demo.   HOME EXERCISE PROGRAM: Soft blue theraputty exercises  GOALS: Goals reviewed with patient? Yes  SHORT TERM GOALS: Target date: 02/26/23 (6 weeks)  Pt will be indep to perform HEP for increasing RUE flexibility, strength, and coordination to better engage the RUE into daily tasks. Baseline: Not yet initiated; 03/26/23: using putty and engaging the R arm as able into daily tasks. Goal status: ongoing  LONG TERM GOALS: Target date: 04/09/23 (12 weeks)  Pt will increase FOTO score to 48 or better to indicate clinically relevant improvement in self perceived use of R arm for daily tasks.  Baseline: Eval: 44; 03/26/23: 48 Goal status: achieved/ongoing  2.  Pt will increase R active shoulder flexion to 100* or better to achieve functional ROM for UB ADLs and reaching for ADL supplies. Baseline: Eval:  R 38 degrees (passive 60);  pt reaches during ADLs only with the L arm; 04/24/29: R 88* (passive 118). Goal status: ongoing  3.  Pt will increase R grip strength by 10 or more lbs to enable use of R hand to hold and carry light ADL supplies. Baseline: Eval: R grip 4 lbs, L 50 lbs; 03/26/23: R grip 18 lbs (pt still uses L hand to hold and carry light ADL supplies) Goal status: ongoing  4.  Pt will report <4/10 pain throughout the RUE to enable improved tolerance with using RUE during daily tasks.  Baseline: Eval: 6-7/10 pain throughout the R side (pt predominantly uses L hand for ADLs); 03/26/23: 6/10 pain; pt demonstrating less sensitivity to touch and tolerates stretching throughout the RUE without jumping or grimacing.   Goal status: ongoing  5.  Pt will increase R hand dexterity/FMC skills to manipulate clothing fasteners with bilat hands. Baseline: Eval: pt avoids clothing fasteners or manages with extra time using L hand only (R 9 hole: unable, L 24 sec); 03/26/23: pt uses the R hand as a stabilizer with min vc (9 hole peg test 5 min 11 sec) Goal status: ongoing  ASSESSMENT:  CLINICAL IMPRESSION: Pt continues to make steady progress towards goals.  Pt practiced manipulation of jumbo pegs using a 2 point, 3 point and 5 finger grasp pattern.  Pt was able to pick up pegs from table top and move from horizontal position to vertical position into pegboard by pronating and supinating wrist d/t lacking digit isolation skills required to reposition pegs within fingertips.  Pt will continue to benefit from skilled OT to reduce R shoulder stiffness, improve RUE strength and coordination, and reduce pain throughout the RUE in order to maximize functional use of the RUE with daily tasks.   PERFORMANCE DEFICITS: in functional skills including ADLs, IADLs, coordination, dexterity, sensation, tone, ROM, strength, pain, flexibility, Fine motor control, Gross motor control, mobility, balance, body mechanics, decreased knowledge of use of  DME, vision, and UE functional use.  IMPAIRMENTS: are limiting patient from ADLs, IADLs, rest and sleep, work, and leisure.   CO-MORBIDITIES: has co-morbidities such as CHF, depression  that affects occupational performance. Patient will benefit from skilled OT to address above impairments and improve overall function.  MODIFICATION OR ASSISTANCE TO COMPLETE EVALUATION: No modification of tasks or assist necessary to complete an evaluation.  OT OCCUPATIONAL PROFILE AND HISTORY: Problem focused assessment: Including review of records relating to presenting problem.  CLINICAL DECISION MAKING: Moderate - several treatment options, min-mod task modification necessary  REHAB POTENTIAL: Good  EVALUATION COMPLEXITY: Moderate    PLAN:  OT FREQUENCY: 1-2x/week (pending any visit limitations with insurance), but would benefit from 2x/week  OT DURATION: 12 weeks  PLANNED INTERVENTIONS: self care/ADL training, therapeutic exercise, therapeutic activity, neuromuscular re-education, manual therapy, passive range of motion, balance training, moist heat, cryotherapy, patient/family education, coping strategies training, and DME and/or AE instructions  RECOMMENDED OTHER SERVICES: OT recommended pt follow up with her eye doctor as she does report some increased blurred vision since her CVA  CONSULTED AND AGREED WITH PLAN OF CARE: Patient  PLAN FOR NEXT SESSION: see above  Danelle Earthly, MS, OTR/L  Otis Dials, OT 04/06/2023, 1:46 PM

## 2023-04-07 ENCOUNTER — Ambulatory Visit: Payer: Medicaid Other | Admitting: Physical Therapy

## 2023-04-09 ENCOUNTER — Ambulatory Visit: Payer: Medicaid Other | Admitting: Physical Therapy

## 2023-04-09 ENCOUNTER — Ambulatory Visit: Payer: Medicaid Other

## 2023-04-09 DIAGNOSIS — Z7689 Persons encountering health services in other specified circumstances: Secondary | ICD-10-CM | POA: Diagnosis not present

## 2023-04-14 ENCOUNTER — Ambulatory Visit: Payer: Medicaid Other | Admitting: Physical Therapy

## 2023-04-14 ENCOUNTER — Telehealth: Payer: Self-pay | Admitting: Physical Therapy

## 2023-04-14 NOTE — Telephone Encounter (Signed)
Called patient to explain to her the [facility] has denied any additional PT visits.  Asked patient is she would like to continue with PT at the pro bono clinic at [facility], through the [facility] clinic, and patient was agreeable.  Outpatient rehab office will send order to referring MD to get [facility] signed and sent over to them. Did confirm with patient that she was still approved for OT and confirmed her next appt on Wednesday at 315pm Rosana Fret [facility] Main rehab services

## 2023-04-16 ENCOUNTER — Ambulatory Visit: Payer: Medicaid Other | Admitting: Physical Therapy

## 2023-04-16 ENCOUNTER — Ambulatory Visit: Payer: Medicaid Other | Attending: Internal Medicine

## 2023-04-16 DIAGNOSIS — M6281 Muscle weakness (generalized): Secondary | ICD-10-CM

## 2023-04-16 DIAGNOSIS — R278 Other lack of coordination: Secondary | ICD-10-CM

## 2023-04-16 DIAGNOSIS — Z7689 Persons encountering health services in other specified circumstances: Secondary | ICD-10-CM | POA: Diagnosis not present

## 2023-04-16 NOTE — Therapy (Signed)
OUTPATIENT OCCUPATIONAL THERAPY NEURO RECERTIFICATION NOTE  Patient Name: ULANA GESUALDI MRN: [id number] DOB:[birth year], 32 y.o., female Today's Date: 04/18/2023  PCP: Dr. Margarita Mail REFERRING PROVIDER: Dr. Margarita Mail   OT End of Session - 04/18/23 0947     Visit Number 12    Number of Visits 24    Date for OT Re-Evaluation 07/09/23    Progress Note Due on Visit 10    OT Start Time 1515    OT Stop Time 1600    OT Time Calculation (min) 45 min    Activity Tolerance Patient tolerated treatment well    Behavior During Therapy [facility] for tasks assessed/performed            Past Medical History:  Diagnosis Date   Arteriovenous malformation of brain    a. s/p remote coiling.   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   Congenital CHF (congestive heart failure) (HCC)    a. 03/2023 Echo: EF 60-65%, no rwma, nl RV fxn, RVSP 29.4mmHg. Mild MR. Mild Ao sclerosis w/o stenosis. Ao root 38mm.   COVID 2022   Hemorrhagic stroke (HCC) 2023   a. L-sided midbrain, thalamic ICH in setting of PCA/ACA aneurysm s/p embolization.   History of broken collarbone 2020   Hydrocephalus (HCC)    a. s/p VP shunt in childhood.   Sepsis secondary to UTI (HCC) 2021   Past Surgical History:  Procedure Laterality Date   VENTRICULOPERITONEAL SHUNT     Patient Active Problem List   Diagnosis Date Noted   CHF (congestive heart failure) (HCC) 03/25/2023   Left-sided nontraumatic intracerebral hemorrhage (HCC) 08/01/2022   AVM (arteriovenous malformation) brain 07/24/2022   Asthma 07/24/2022   Major depressive disorder, recurrent episode, moderate (HCC) 02/07/2022   Sepsis secondary to UTI (HCC) 05/01/2020   VP (ventriculoperitoneal) shunt status 05/01/2020   Right ovarian cyst 05/01/2020   Elevated LFTs 05/01/2020   ONSET DATE: Sept 2023;   REFERRING DIAG:  I63.9 (ICD-10-CM) - CVA (cerebral vascular accident) (HCC)   THERAPY DIAG:  Muscle  weakness (generalized)  Other lack of coordination  Rationale for Evaluation and Treatment: Rehabilitation  SUBJECTIVE:  SUBJECTIVE STATEMENT: Pt reports doing well today.  Reported inability to be here last week as her transportation did not come to pick her up. Pt accompanied by: self   PERTINENT HISTORY: Per Dr. Caralee Ates on 11/19/22: HPI [name] presents to establish care.  She is here with her mom.   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       PRECAUTIONS: None  WEIGHT BEARING RESTRICTIONS: No  PAIN:  Are you having pain? Yes: NPRS scale: 6/10 Pain location: Entire R side, arm and leg Pain description: pins and needles in the hand, arm is achy, moving the arm causes stabbing pain Aggravating factors: movement of the arm Relieving factors: rest; pt reports medication is minimally effective  FALLS: Has patient fallen in last 6 months? Yes. 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  Being independent."  OBJECTIVE:   HAND DOMINANCE: Left  ADLs: Overall ADLs: mostly performed with the  LUE Transfers/ambulation related to ADLs: indep/extra time Eating: cuts food 1 handed Grooming: 1 handed (L) UB Dressing: occasional help to don clothes if tired after a shower; help with straightening clothes; struggles with clothing fasteners LB Dressing: wearing slip on shoes; difficulty with clothing fasteners  Toileting: indep Bathing: modified indep; pt makes attempts to engage the R arm while bathing but reports this is difficult Tub Shower transfers: modified indep Equipment:  see above  IADLs: Shopping: pt can go shopping accompanied by a family member Light housekeeping: 1 handed Meal Prep: cooking causes fatigue; mostly L handed Community mobility: uses rollator for community mobility; uses [employer] transportation Medication management: uses weekly pill organizer; Engineer, materials: mother manages (pt states she doesn't really have any bills)  Handwriting:  N/A (pt is L hand dominant)  MOBILITY STATUS:  ambulatory without AD; pt seeing PT for LE strengthening and balance deficits  POSTURE COMMENTS:  rounded shoulders Sitting balance: Moves/returns truncal midpoint >2 inches in all planes  ACTIVITY TOLERANCE: Activity tolerance: Pain in the R side significantly limits activity tolerance.  Pt reports she can get fatigued with ADLs and IADLs.  FUNCTIONAL OUTCOME MEASURES: FOTO: 44; 48 03/26/23: 48  UPPER EXTREMITY ROM:    Active ROM Right eval Right 03/26/23 Right 04/16/23 Left Eval WNL  Shoulder flexion 38 (60) 88 (118) 121 (127)   Shoulder abduction      Shoulder adduction 42 (51) 85 (105) 98 (110)   Shoulder extension      Shoulder internal rotation  R thumb to posterior hip R thumb to lumbar spine   Shoulder external rotation Hand to back of head (scaption and chin tuck) Hand to back of head (minimal scaption and minimal chin tuck) Hand to back of head without a chin tuck)   Elbow flexion WNL     Elbow extension WNL     Wrist flexion WNL     Wrist  extension WNL     Wrist ulnar deviation      Wrist radial deviation      Wrist pronation WNL     Wrist supination WNL     (Blank rows = not tested)  R hand: Able to oppose each digit to thumb with extra time and able to make full composite fist  UPPER EXTREMITY MMT:     MMT Right eval Right 03/26/23 Right 04/16/23 Left Eval 5/5  Shoulder flexion 3- 3- 3+   Shoulder abduction 3- 3- 3+   Shoulder adduction      Shoulder extension      Shoulder internal rotation 3+ 3+ 4-   Shoulder external rotation 3+ 3+ 3+   Middle trapezius      Lower trapezius      Elbow flexion 4- 4- 4   Elbow extension 4 4 4+   Wrist flexion 4- 4- 4   Wrist extension 3+ 4- 4   Wrist ulnar deviation      Wrist  radial deviation      Wrist pronation      Wrist supination      (Blank rows = not tested)  HAND FUNCTION: Grip strength: Right: 4 lbs; Left: 50 lbs, Lateral pinch: Right: 0 lbs, Left: 17 lbs, and 3 point pinch: Right: 0 lbs, Left: 19 lbs Grip strength: Right: 18 lbs, Lateral pinch: Right 10 lbs, 3 point pinch: Right 6 lbs 04/16/23: Grip strength: Right: 22 lbs, lateral pinch: Right 11 lbs, 3 point pinch: Right: 6 lbs   COORDINATION: Finger Nose Finger test: able to touch nose with increased time 9 Hole Peg test: Right: unable (picked up a peg with repeat trials but unable to move the peg from horizontal to vertical position to place into board) sec; Left: 24 sec 03/26/23: 5 min 11 sec 04/16/23: 4 min 54 sec   SENSATION: tingling in the R hand  Light touch: Impaired  Proprioception: WFL  EDEMA: N/A  MUSCLE TONE: RUE: Mild and Hypertonic  COGNITION: Overall cognitive status: Within functional limits for tasks assessed  VISION: Subjective report: Pt reports increased blurred vision following her stroke Baseline vision: Wears glasses all the time  VISION ASSESSMENT: Ocular ROM: WFL Gaze preference/alignment: gaze left; slight Tracking/Visual pursuits: Able to track stimulus in all quads  without difficulty Saccades: WFL Visual Fields: no apparent deficits  PERCEPTION: WFL  PRAXIS: Impaired: Motor planning  OBSERVATIONS: Pt guards RUE with flexed elbow and shoulder IR/hand to abdomen during mobility.  R scapular elevation noting increased tension in R upper trap.  TODAY'S TREATMENT:        Moist heat to R shoulder applied intermittently throughout session for pain management/muscle relaxation during simultaneously completion of stretching and neuro re-ed activities noted below.                                                                                                                        Therapeutic Exercise: Objective measures taken and goals updated for recertification note.  Performed passive stretching throughout the RUE, including all planes for R shoulder in prep for engaging the RUE into therapeutic activities noted below.  Therapeutic Activity: Facilitated R hand FMC/dexterity skills working to pick 1.5 cm alphabet dice in R hand from table top.  Pt practiced manipulation skills with 1 dice at a time, moving dice from palm to fingertips and repositioning it to find a designated letter without dropping it.   PATIENT EDUCATION: Education details: Progress towards goals Person educated: Patient Education method: explanation Education comprehension: verbalized understanding  HOME EXERCISE PROGRAM: Soft blue theraputty exercises  GOALS: Goals reviewed with patient? Yes  SHORT TERM GOALS: Target date: 05/28/23 (6 weeks from recert on 04/16/23)  Pt will be indep to perform HEP for increasing RUE flexibility, strength, and coordination to better engage the RUE into daily tasks. Baseline: Not yet initiated; 03/26/23: using putty and engaging the R arm as able into daily tasks; 04/16/23: pt continues with putty and engaging the RUE into ADLs; will progress  when able Goal status: ongoing  LONG TERM GOALS: Target date: 07/09/23 (12 weeks from recert on  04/16/23)  Pt will increase FOTO score to 48 or better to indicate clinically relevant improvement in self perceived use of R arm for daily tasks.  Baseline: Eval: 44; 03/26/23: 48 Goal status: achieved/ongoing  2.  Pt will increase R active shoulder flexion to 100* or better to achieve functional ROM for UB ADLs and reaching for ADL supplies. Baseline: Eval: R 38 degrees (passive 60); pt reaches during ADLs only with the L arm; 07/03/55: R 88* (passive 118); 04/16/23: R 121 degrees (passive 127), pt beginning to engage the RUE into reaching for light ADL supplies Goal status: ongoing  3.  Pt will increase R grip strength to 30 or more lbs to enable use of R hand to hold and carry a grocery bag in the R hand. Baseline: Eval: R grip 4 lbs, L 50 lbs; 03/26/23: R grip 18 lbs (pt still uses L hand to hold and carry light ADL supplies); 04/16/23: R grip 22 lbs Goal status: revised  4.  Pt will report <4/10 pain throughout the RUE to enable improved tolerance with using RUE during daily tasks.  Baseline: Eval: 6-7/10 pain throughout the R side (pt predominantly uses L hand for ADLs); 03/26/23: 6/10 pain; pt demonstrating less sensitivity to touch and tolerates stretching throughout the RUE without jumping or grimacing; 04/16/23: 5-6/10 pain, consistent with report on 03/26/23 (pt has had some medication adjustments with minimal improvement, and pt is now planning Botox at [facility] on July 10.  Goal status: ongoing  5.  Pt will increase R hand dexterity/FMC skills to manipulate clothing fasteners with bilat hands. Baseline: Eval: pt avoids clothing fasteners or manages with extra time using L hand only (R 9 hole: unable, L 24 sec); 03/26/23: pt uses the R hand as a stabilizer with min vc (9 hole peg test 5 min 11 sec); 04/16/23: pt can now button and zip with bilat hands; inconsistent to tie laces (R 9 hole peg test 4 min 54 sec). Goal status: ongoing  ASSESSMENT:  CLINICAL IMPRESSION: Pt seen for 12th OT visit  and recertification.  Pt continues to make steady progress towards goals.  Pt is consistent to use theraputty at home for R hand strengthening/coordination training, and has been consistent to complete cane stretches for increasing RUE mobility.  Good gains noted with AROM at the R shoulder, and pt verbalizes she is now engaging the RUE into nearly everything she does.  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  Pt is planning to have her first round of Botox at [facility] on July 10th to help manage spasticity in the RUE.  Pt will continue to benefit from skilled OT to reduce R shoulder stiffness, improve RUE strength and FMC, and reduce pain throughout the RUE in order to maximize functional use of the RUE with daily tasks.   PERFORMANCE DEFICITS: in functional skills including ADLs, IADLs, coordination, dexterity, sensation, tone, ROM, strength, pain, flexibility, Fine motor control, Gross motor control, mobility, balance, body mechanics, decreased knowledge of use of DME, vision, and UE functional use.  IMPAIRMENTS: are limiting patient from ADLs, IADLs, rest and sleep, work, and leisure.   CO-MORBIDITIES: has co-morbidities such as CHF, depression  that affects occupational performance. Patient will benefit from skilled OT to address above impairments and  improve overall function.  MODIFICATION OR ASSISTANCE TO COMPLETE EVALUATION: No modification of tasks or assist necessary to complete an evaluation.  OT OCCUPATIONAL PROFILE AND HISTORY: Problem focused assessment: Including review of records relating to presenting problem.  CLINICAL DECISION MAKING: Moderate - several treatment options, min-mod task modification necessary  REHAB POTENTIAL: Good  EVALUATION COMPLEXITY: Moderate    PLAN:  OT FREQUENCY: 1-2x/week (pending any visit limitations with insurance), but would benefit from  2x/week  OT DURATION: 12 weeks  PLANNED INTERVENTIONS: self care/ADL training, therapeutic exercise, therapeutic activity, neuromuscular re-education, manual therapy, passive range of motion, balance training, moist heat, cryotherapy, patient/family education, coping strategies training, and DME and/or AE instructions  RECOMMENDED OTHER SERVICES: OT recommended pt follow up with her eye doctor as she does report some increased blurred vision since her CVA  CONSULTED AND AGREED WITH PLAN OF CARE: Patient  PLAN FOR NEXT SESSION: see above  Danelle Earthly, MS, OTR/L  Otis Dials, OT 04/18/2023, 9:49 AM

## 2023-04-21 ENCOUNTER — Ambulatory Visit: Payer: Medicaid Other | Admitting: Physical Therapy

## 2023-04-22 ENCOUNTER — Encounter: Payer: Self-pay | Admitting: Internal Medicine

## 2023-04-22 ENCOUNTER — Ambulatory Visit (INDEPENDENT_AMBULATORY_CARE_PROVIDER_SITE_OTHER): Payer: Medicaid Other | Admitting: Internal Medicine

## 2023-04-22 VITALS — BP 116/80 | HR 112 | Temp 98.1°F | Resp 18 | Ht 60.0 in | Wt 156.1 lb

## 2023-04-22 DIAGNOSIS — J301 Allergic rhinitis due to pollen: Secondary | ICD-10-CM | POA: Diagnosis not present

## 2023-04-22 DIAGNOSIS — J029 Acute pharyngitis, unspecified: Secondary | ICD-10-CM | POA: Diagnosis not present

## 2023-04-22 LAB — POCT RAPID STREP A (OFFICE): Rapid Strep A Screen: NEGATIVE

## 2023-04-22 NOTE — Patient Instructions (Addendum)
It was great seeing you today!  Plan discussed at today's visit: -Recommend starting an oral anti-histamine like Zyrtec, if this causes sedation can either take at night or switch to Allegra, Claritin or Xyzal  -Can also try Zadiator eye drops for allergies and Astelin nasal spray all over the counter   Follow up in: as needed   Take care and let us know if you have any questions or concerns prior to your next visit.  Dr. Caralee Ates

## 2023-04-22 NOTE — Progress Notes (Signed)
Acute Office Visit  Subjective:     Patient ID: Anna Tapia, female    DOB: Nov 20, 1990, 32 y.o.   MRN: [id number]  Chief Complaint  Patient presents with   URI    Sore throat, runny nose, headache. Started yesterday    URI  Associated symptoms include congestion, headaches and a sore throat. Pertinent negatives include no chest pain, coughing, ear pain, sinus pain or wheezing.   Patient is in today for sore throat, rhinorrhea x 1 day.  URI Compliant:  -Fever: no -Cough: no -Shortness of breath: no -Wheezing: yes -Nasal congestion: yes -Runny nose: yes, clear -Post nasal drip: no -Sneezing: no -Sore throat: yes -Sinus pressure: yes -Headache: yes -Ear pain: no  -Ear pressure: no  -Sick contacts: no  -Treatments attempted: none    Review of Systems  Constitutional:  Negative for chills.  HENT:  Positive for congestion and sore throat. Negative for ear pain and sinus pain.   Respiratory:  Negative for cough, shortness of breath and wheezing.   Cardiovascular:  Negative for chest pain.  Neurological:  Positive for headaches.        Objective:    BP 116/80   Pulse (!) 112   Temp 98.1 F (36.7 C)   Resp 18   Ht 5' (1.524 m)   Wt 156 lb 1.6 oz (70.8 kg)   LMP 03/07/2023 (Exact Date) Comment: not pregnant, not breastfeeding.  SpO2 98%   BMI 30.49 kg/m  BP Readings from Last 3 Encounters:  04/22/23 116/80  03/25/23 (!) 140/86  03/21/23 130/88   Wt Readings from Last 3 Encounters:  04/22/23 156 lb 1.6 oz (70.8 kg)  03/25/23 151 lb 6.4 oz (68.7 kg)  03/21/23 152 lb 12.8 oz (69.3 kg)      Physical Exam Constitutional:      Appearance: Normal appearance.  HENT:     Head: Normocephalic and atraumatic.     Right Ear: Ear canal and external ear normal. There is impacted cerumen.     Left Ear: Ear canal and external ear normal. There is impacted cerumen.     Nose: Nose normal.     Mouth/Throat:     Mouth: Mucous membranes are moist.      Comments: Very mild PND present  Eyes:     Conjunctiva/sclera: Conjunctivae normal.  Cardiovascular:     Rate and Rhythm: Normal rate and regular rhythm.  Pulmonary:     Effort: Pulmonary effort is normal.     Breath sounds: Normal breath sounds.  Skin:    General: Skin is warm and dry.  Neurological:     General: No focal deficit present.     Mental Status: She is alert. Mental status is at baseline.  Psychiatric:        Mood and Affect: Mood normal.        Behavior: Behavior normal.     Results for orders placed or performed in visit on 04/22/23  POCT rapid strep A  Result Value Ref Range   Rapid Strep A Screen Negative Negative        Assessment & Plan:   1. Allergic rhinitis due to pollen, unspecified seasonality/Sore throat: POC rapid strep negative, COVID test pending. Symptoms consistent more with allergies than viral, discussed starting an oral anti-histamine like Zyrtec daily. Also discussed trying Zaditor eye drops for watery itchy eyes and Astelin for congestion/post nasal drip.   - POCT rapid strep A - Novel Coronavirus, NAA ([facility])  Return  if symptoms worsen or fail to improve.  Margarita Mail, DO

## 2023-04-23 ENCOUNTER — Ambulatory Visit: Payer: Medicaid Other | Admitting: Physical Therapy

## 2023-04-23 ENCOUNTER — Ambulatory Visit: Payer: Medicaid Other

## 2023-04-24 LAB — SPECIMEN STATUS REPORT

## 2023-04-24 LAB — NOVEL CORONAVIRUS, NAA: SARS-CoV-2, NAA: NOT DETECTED

## 2023-04-28 ENCOUNTER — Encounter: Payer: Self-pay | Admitting: Internal Medicine

## 2023-04-28 ENCOUNTER — Ambulatory Visit: Payer: Medicaid Other | Admitting: Physical Therapy

## 2023-04-30 ENCOUNTER — Ambulatory Visit: Payer: Medicaid Other | Admitting: Physical Therapy

## 2023-04-30 ENCOUNTER — Ambulatory Visit: Payer: Medicaid Other

## 2023-04-30 DIAGNOSIS — M6281 Muscle weakness (generalized): Secondary | ICD-10-CM | POA: Diagnosis not present

## 2023-04-30 DIAGNOSIS — R278 Other lack of coordination: Secondary | ICD-10-CM | POA: Diagnosis not present

## 2023-04-30 DIAGNOSIS — Z7689 Persons encountering health services in other specified circumstances: Secondary | ICD-10-CM | POA: Diagnosis not present

## 2023-04-30 NOTE — Therapy (Signed)
OUTPATIENT OCCUPATIONAL THERAPY NEURO TREATMENT NOTE  Patient Name: Anna Tapia MRN: [id number] DOB:1991/05/01, 32 y.o., female Today's Date: 05/02/2023  PCP: Dr. Margarita Mail REFERRING PROVIDER: Dr. Margarita Mail   OT End of Session - 05/02/23 1056     Visit Number 13    Number of Visits 24    Date for OT Re-Evaluation 07/09/23    Authorization Type Visit 13 of 24 as of 04/30/23    Authorization - Visit Number 13    Authorization - Number of Visits 24    Progress Note Due on Visit 10    OT Start Time 1430    OT Stop Time 1515    OT Time Calculation (min) 45 min    Activity Tolerance Patient tolerated treatment well    Behavior During Therapy WFL for tasks assessed/performed            Past Medical History:  Diagnosis Date   Arteriovenous malformation of brain    a. s/p remote coiling.   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   Congenital CHF (congestive heart failure) (HCC)    a. 03/2023 Echo: EF 60-65%, no rwma, nl RV fxn, RVSP 29.29mmHg. Mild MR. Mild Ao sclerosis w/o stenosis. Ao root 38mm.   COVID 2022   Hemorrhagic stroke (HCC) 2023   a. L-sided midbrain, thalamic ICH in setting of PCA/ACA aneurysm s/p embolization.   History of broken collarbone 2020   Hydrocephalus (HCC)    a. s/p VP shunt in childhood.   Sepsis secondary to UTI (HCC) 2021   Past Surgical History:  Procedure Laterality Date   VENTRICULOPERITONEAL SHUNT     Patient Active Problem List   Diagnosis Date Noted   CHF (congestive heart failure) (HCC) 03/25/2023   Left-sided nontraumatic intracerebral hemorrhage (HCC) 08/01/2022   AVM (arteriovenous malformation) brain 07/24/2022   Asthma 07/24/2022   Major depressive disorder, recurrent episode, moderate (HCC) 02/07/2022   Sepsis secondary to UTI (HCC) 05/01/2020   VP (ventriculoperitoneal) shunt status 05/01/2020   Right ovarian cyst 05/01/2020   Elevated LFTs 05/01/2020   ONSET DATE:  Sept 2023;   REFERRING DIAG:  I63.9 (ICD-10-CM) - CVA (cerebral vascular accident) (HCC)   THERAPY DIAG:  Muscle weakness (generalized)  Other lack of coordination  Rationale for Evaluation and Treatment: Rehabilitation  SUBJECTIVE:  SUBJECTIVE STATEMENT: Pt reports she has her Botox consult scheduled at [facility] for July 10. Pt accompanied by: self   PERTINENT HISTORY: Per Dr. Caralee Ates on 11/19/22: HPI [name] presents to establish care.  She is here with her mom.   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       PRECAUTIONS: None  WEIGHT BEARING RESTRICTIONS: No  PAIN:  Are you having pain? Yes: NPRS scale: 4-5/10 Pain location: Entire R side, arm and leg Pain description: pins and needles in the hand, arm is achy, moving the arm causes stabbing pain Aggravating factors: movement of the arm Relieving factors: rest; pt reports medication is minimally effective  FALLS:  Has patient fallen in last 6 months? Yes. 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  Being independent."  OBJECTIVE:   HAND DOMINANCE:  Left  ADLs: Overall ADLs: mostly performed with the LUE Transfers/ambulation related to ADLs: indep/extra time Eating: cuts food 1 handed Grooming: 1 handed (L) UB Dressing: occasional help to don clothes if tired after a shower; help with straightening clothes; struggles with clothing fasteners LB Dressing: wearing slip on shoes; difficulty with clothing fasteners  Toileting: indep Bathing: modified indep; pt makes attempts to engage the R arm while bathing but reports this is difficult Tub Shower transfers: modified indep Equipment:  see above  IADLs: Shopping: pt can go shopping accompanied by a family member Light housekeeping: 1 handed Meal Prep: cooking causes fatigue; mostly L handed Community mobility: uses rollator for community mobility; uses [employer] transportation Medication management: uses weekly pill organizer; Engineer, materials: mother manages (pt states she doesn't really have any bills)  Handwriting:  N/A (pt is L hand dominant)  MOBILITY STATUS:  ambulatory without AD; pt seeing PT for LE strengthening and balance deficits  POSTURE COMMENTS:  rounded shoulders Sitting balance: Moves/returns truncal midpoint >2 inches in all planes  ACTIVITY TOLERANCE: Activity tolerance: Pain in the R side significantly limits activity tolerance.  Pt reports she can get fatigued with ADLs and IADLs.  FUNCTIONAL OUTCOME MEASURES: FOTO: 44; 48 03/26/23: 48  UPPER EXTREMITY ROM:    Active ROM Right eval Right 03/26/23 Right 04/16/23 Left Eval WNL  Shoulder flexion 38 (60) 88 (118) 121 (127)   Shoulder abduction      Shoulder adduction 42 (51) 85 (105) 98 (110)   Shoulder extension      Shoulder internal rotation  R thumb to posterior hip R thumb to lumbar spine   Shoulder external rotation Hand to back of head (scaption and chin tuck) Hand to back of head (minimal scaption and minimal chin tuck) Hand to back of head without a chin tuck)   Elbow flexion WNL      Elbow extension WNL     Wrist flexion WNL     Wrist extension WNL     Wrist ulnar deviation      Wrist radial deviation      Wrist pronation WNL     Wrist supination WNL     (Blank rows = not tested)  R hand: Able to oppose each digit to thumb with extra time and able to make full composite fist  UPPER EXTREMITY MMT:     MMT Right eval Right 03/26/23 Right 04/16/23 Left Eval 5/5  Shoulder flexion 3- 3- 3+   Shoulder abduction 3- 3- 3+   Shoulder adduction      Shoulder extension      Shoulder internal rotation 3+ 3+ 4-   Shoulder external rotation 3+ 3+ 3+   Middle trapezius      Lower trapezius      Elbow flexion 4- 4- 4   Elbow extension 4 4 4+   Wrist flexion 4-  4- 4   Wrist extension 3+ 4- 4   Wrist ulnar deviation      Wrist radial deviation      Wrist pronation      Wrist supination      (Blank rows = not tested)  HAND FUNCTION: Grip strength: Right: 4 lbs; Left: 50 lbs, Lateral pinch: Right: 0 lbs, Left: 17 lbs, and 3 point pinch: Right: 0 lbs, Left: 19 lbs Grip strength: Right: 18 lbs, Lateral pinch: Right 10 lbs, 3 point pinch: Right 6 lbs 04/16/23: Grip strength: Right: 22 lbs, lateral pinch: Right 11 lbs, 3 point pinch: Right: 6 lbs   COORDINATION: Finger Nose Finger test: able to touch nose with increased time 9 Hole Peg test: Right: unable (picked up a peg with repeat trials but unable to move the peg from horizontal to vertical position to place into board) sec; Left: 24 sec 03/26/23: 5 min 11 sec 04/16/23: 4 min 54 sec   SENSATION: tingling in the R hand  Light touch: Impaired  Proprioception: WFL  EDEMA: N/A  MUSCLE TONE: RUE: Mild and Hypertonic  COGNITION: Overall cognitive status: Within functional limits for tasks assessed  VISION: Subjective report: Pt reports increased blurred vision following her stroke Baseline vision: Wears glasses all the time  VISION ASSESSMENT: Ocular ROM: WFL Gaze preference/alignment: gaze left;  slight Tracking/Visual pursuits: Able to track stimulus in all quads without difficulty Saccades: WFL Visual Fields: no apparent deficits  PERCEPTION: WFL  PRAXIS: Impaired: Motor planning  OBSERVATIONS: Pt guards RUE with flexed elbow and shoulder IR/hand to abdomen during mobility.  R scapular elevation noting increased tension in R upper trap.  TODAY'S TREATMENT:        Moist heat to R shoulder applied intermittently throughout session for pain management/muscle relaxation during simultaneously completion of stretching and neuro re-ed activities noted below.                                                                                                                        Therapeutic Exercise: Performed passive stretching throughout the RUE, including all planes for R shoulder in prep for engaging the RUE into therapeutic exercises and neuro re-ed activities as noted.  Facilitated hand strengthening with use of hand gripper set at 6.6# to remove jumbo pegs from pegboard x2 trials using R hand.  Peg container was placed to promote forward and lateral reaching patterns when gathering and returning pegs from/to container.  Neuro re-ed: Practiced high reps of 2 point and 3 point pinch patterns on the R hand, working to move jumbo pegs from horizontal<>vertical positions.    PATIENT EDUCATION: Education details: 2 point and 3 point pinch prehension patterns with the R hand Person educated: Patient Education method: explanation Education comprehension: verbalized understanding  HOME EXERCISE PROGRAM: Soft blue theraputty exercises  GOALS: Goals reviewed with patient? Yes  SHORT TERM GOALS: Target date: 05/28/23 (6 weeks from recert on 04/16/23)  Pt will be indep to perform HEP for increasing  RUE flexibility, strength, and coordination to better engage the RUE into daily tasks. Baseline: Not yet initiated; 03/26/23: using putty and engaging the R arm as able into daily tasks; 04/16/23:  pt continues with putty and engaging the RUE into ADLs; will progress when able Goal status: ongoing  LONG TERM GOALS: Target date: 07/09/23 (12 weeks from recert on 04/16/23)  Pt will increase FOTO score to 48 or better to indicate clinically relevant improvement in self perceived use of R arm for daily tasks.  Baseline: Eval: 44; 03/26/23: 48 Goal status: achieved/ongoing  2.  Pt will increase R active shoulder flexion to 100* or better to achieve functional ROM for UB ADLs and reaching for ADL supplies. Baseline: Eval: R 38 degrees (passive 60); pt reaches during ADLs only with the L arm; 1/61/09: R 88* (passive 118); 04/16/23: R 121 degrees (passive 127), pt beginning to engage the RUE into reaching for light ADL supplies Goal status: ongoing  3.  Pt will increase R grip strength to 30 or more lbs to enable use of R hand to hold and carry a grocery bag in the R hand. Baseline: Eval: R grip 4 lbs, L 50 lbs; 03/26/23: R grip 18 lbs (pt still uses L hand to hold and carry light ADL supplies); 04/16/23: R grip 22 lbs Goal status: revised  4.  Pt will report <4/10 pain throughout the RUE to enable improved tolerance with using RUE during daily tasks.  Baseline: Eval: 6-7/10 pain throughout the R side (pt predominantly uses L hand for ADLs); 03/26/23: 6/10 pain; pt demonstrating less sensitivity to touch and tolerates stretching throughout the RUE without jumping or grimacing; 04/16/23: 5-6/10 pain, consistent with report on 03/26/23 (pt has had some medication adjustments with minimal improvement, and pt is now planning Botox at [facility] on July 10.  Goal status: ongoing  5.  Pt will increase R hand dexterity/FMC skills to manipulate clothing fasteners with bilat hands. Baseline: Eval: pt avoids clothing fasteners or manages with extra time using L hand only (R 9 hole: unable, L 24 sec); 03/26/23: pt uses the R hand as a stabilizer with min vc (9 hole peg test 5 min 11 sec); 04/16/23: pt can now button and zip  with bilat hands; inconsistent to tie laces (R 9 hole peg test 4 min 54 sec). Goal status: ongoing  ASSESSMENT:  CLINICAL IMPRESSION: Pt seen for 13th visit of 24 approved.  Pt continues to report pain throughout the RUE, though improved from initial eval.  Pt reports her Botox consult is coming up on July 10th at [facility], and she is hopeful the Botox will help with the pain in her R arm.  OT provided education that the Botox is used to help with relaxing muscle groups which remain stiff from spasticity, and she may find some relief as the muscles relax, but pt also seems to have more neuropathic pain that may not resolve from Botox.  Pt continues to present with flexor synergy pattern in the RUE, keeping R hand and elbow flexed, and shoulder adducted and internally rotated to abdomen when walking.  Pt requires vc and tactile cues to initiate a slight arm swing while ambulating and standing.  Pt demonstrated improved digit isolation this date when manipulating jumbo pegs.  Pt was able to hold pegs with a 2 point pinch and reposition peg with the LF without dropping the peg.  Pt was able to successfully sustain a 2 point and 3 point pinch pattern without the  peg slipping as a result of flexor synergy pattern in the hand.  Pt showed improved control of the R SF, previously extended and abducted this digit when grasping pegs, but pt was able to engage the R finger when practicing a 5 fingered grasp of the peg at the fingertips.  Pt continues to make steady progress towards goals.  Pt will continue to benefit from skilled OT to reduce R shoulder stiffness, improve RUE strength and FMC, and reduce pain throughout the RUE in order to maximize functional use of the RUE with daily tasks.   PERFORMANCE DEFICITS: in functional skills including ADLs, IADLs, coordination, dexterity, sensation, tone, ROM, strength, pain, flexibility, Fine motor control, Gross motor control, mobility, balance, body mechanics, decreased  knowledge of use of DME, vision, and UE functional use.  IMPAIRMENTS: are limiting patient from ADLs, IADLs, rest and sleep, work, and leisure.   CO-MORBIDITIES: has co-morbidities such as CHF, depression  that affects occupational performance. Patient will benefit from skilled OT to address above impairments and improve overall function.  MODIFICATION OR ASSISTANCE TO COMPLETE EVALUATION: No modification of tasks or assist necessary to complete an evaluation.  OT OCCUPATIONAL PROFILE AND HISTORY: Problem focused assessment: Including review of records relating to presenting problem.  CLINICAL DECISION MAKING: Moderate - several treatment options, min-mod task modification necessary  REHAB POTENTIAL: Good  EVALUATION COMPLEXITY: Moderate    PLAN:  OT FREQUENCY: 1-2x/week (pending any visit limitations with insurance), but would benefit from 2x/week  OT DURATION: 12 weeks  PLANNED INTERVENTIONS: self care/ADL training, therapeutic exercise, therapeutic activity, neuromuscular re-education, manual therapy, passive range of motion, balance training, moist heat, cryotherapy, patient/family education, coping strategies training, and DME and/or AE instructions  RECOMMENDED OTHER SERVICES: OT recommended pt follow up with her eye doctor as she does report some increased blurred vision since her CVA  CONSULTED AND AGREED WITH PLAN OF CARE: Patient  PLAN FOR NEXT SESSION: see above  Danelle Earthly, MS, OTR/L  Otis Dials, OT 05/02/2023, 11:01 AM

## 2023-05-05 DIAGNOSIS — Z419 Encounter for procedure for purposes other than remedying health state, unspecified: Secondary | ICD-10-CM | POA: Diagnosis not present

## 2023-05-06 ENCOUNTER — Ambulatory Visit: Payer: Self-pay | Admitting: Nurse Practitioner

## 2023-05-06 ENCOUNTER — Ambulatory Visit: Payer: Medicaid Other | Attending: Internal Medicine

## 2023-05-06 DIAGNOSIS — M6281 Muscle weakness (generalized): Secondary | ICD-10-CM | POA: Insufficient documentation

## 2023-05-06 DIAGNOSIS — R278 Other lack of coordination: Secondary | ICD-10-CM | POA: Insufficient documentation

## 2023-05-06 DIAGNOSIS — Z7689 Persons encountering health services in other specified circumstances: Secondary | ICD-10-CM | POA: Diagnosis not present

## 2023-05-06 NOTE — Therapy (Signed)
OUTPATIENT OCCUPATIONAL THERAPY NEURO TREATMENT NOTE  Patient Name: Anna Tapia MRN: [id number] DOB:1990/12/03, 32 y.o., female Today's Date: 05/06/2023  PCP: Dr. Margarita Mail REFERRING PROVIDER: Dr. Margarita Mail   OT End of Session - 05/06/23 1618     Visit Number 14    Number of Visits 24    Date for OT Re-Evaluation 07/09/23    Authorization Type Visit 14 of 24 as of 05/06/23    Authorization - Visit Number 14    Authorization - Number of Visits 24    Progress Note Due on Visit 10    OT Start Time 1345    OT Stop Time 1430    OT Time Calculation (min) 45 min    Activity Tolerance Patient tolerated treatment well    Behavior During Therapy WFL for tasks assessed/performed            Past Medical History:  Diagnosis Date   Arteriovenous malformation of brain    a. s/p remote coiling.   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   Congenital CHF (congestive heart failure) (HCC)    a. 03/2023 Echo: EF 60-65%, no rwma, nl RV fxn, RVSP 29.52mmHg. Mild MR. Mild Ao sclerosis w/o stenosis. Ao root 38mm.   COVID 2022   Hemorrhagic stroke (HCC) 2023   a. L-sided midbrain, thalamic ICH in setting of PCA/ACA aneurysm s/p embolization.   History of broken collarbone 2020   Hydrocephalus (HCC)    a. s/p VP shunt in childhood.   Sepsis secondary to UTI (HCC) 2021   Past Surgical History:  Procedure Laterality Date   VENTRICULOPERITONEAL SHUNT     Patient Active Problem List   Diagnosis Date Noted   CHF (congestive heart failure) (HCC) 03/25/2023   Left-sided nontraumatic intracerebral hemorrhage (HCC) 08/01/2022   AVM (arteriovenous malformation) brain 07/24/2022   Asthma 07/24/2022   Major depressive disorder, recurrent episode, moderate (HCC) 02/07/2022   Sepsis secondary to UTI (HCC) 05/01/2020   VP (ventriculoperitoneal) shunt status 05/01/2020   Right ovarian cyst 05/01/2020   Elevated LFTs 05/01/2020   ONSET DATE:  Sept 2023;   REFERRING DIAG:  I63.9 (ICD-10-CM) - CVA (cerebral vascular accident) (HCC)   THERAPY DIAG:  Muscle weakness (generalized)  Other lack of coordination  Rationale for Evaluation and Treatment: Rehabilitation  SUBJECTIVE:  SUBJECTIVE STATEMENT: Pt reports no pain in the R arm today, though it does remain spastic.  Pt is looking forward to her consult for Botox next week on July 10th. Pt accompanied by: self   PERTINENT HISTORY: Per Dr. Caralee Ates on 11/19/22: HPI Anna Tapia presents to establish care.  She is here with her mom.   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       PRECAUTIONS: None  WEIGHT BEARING RESTRICTIONS: No  PAIN: 05/06/23: No pain in RUE this date Are you having pain? Yes: NPRS scale: 4-5/10 Pain location: Entire R side, arm and leg Pain description: pins and needles in the hand, arm is achy, moving  the arm causes stabbing pain Aggravating factors: movement of the arm Relieving factors: rest; pt reports medication is minimally effective  FALLS: Has patient fallen in last 6 months? Yes. 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  Being independent."  OBJECTIVE:   HAND DOMINANCE: Left  ADLs: Overall ADLs: mostly performed with the LUE Transfers/ambulation related to ADLs: indep/extra time Eating: cuts food 1 handed Grooming: 1 handed (L) UB Dressing: occasional help to don clothes if tired after a shower; help with straightening clothes; struggles with clothing fasteners LB Dressing: wearing slip on shoes; difficulty with clothing fasteners  Toileting: indep Bathing: modified indep; pt makes attempts to engage the R arm while bathing but reports this is difficult Tub Shower transfers: modified indep Equipment:  see above  IADLs: Shopping: pt can go shopping accompanied by a family member Light housekeeping: 1 handed Meal Prep: cooking causes fatigue; mostly L handed Community mobility: uses rollator for community mobility; uses [employer] transportation Medication management: uses weekly pill organizer; Engineer, materials: mother manages (pt states she doesn't really have any bills)  Handwriting:  N/A (pt is L hand dominant)  MOBILITY STATUS:  ambulatory without AD; pt seeing PT for LE strengthening and balance deficits  POSTURE COMMENTS:  rounded shoulders Sitting balance: Moves/returns truncal midpoint >2 inches in all planes  ACTIVITY TOLERANCE: Activity tolerance: Pain in the R side significantly limits activity tolerance.  Pt reports she can get fatigued with ADLs and IADLs.  FUNCTIONAL OUTCOME MEASURES: FOTO: 44; 48 03/26/23: 48  UPPER EXTREMITY ROM:    Active ROM Right eval Right 03/26/23 Right 04/16/23 Left Eval WNL  Shoulder flexion 38 (60) 88 (118) 121 (127)   Shoulder abduction      Shoulder adduction 42 (51) 85 (105) 98 (110)   Shoulder extension      Shoulder internal rotation  R thumb to posterior hip R thumb to lumbar spine   Shoulder external rotation Hand to back of head (scaption and chin tuck) Hand to back of head  (minimal scaption and minimal chin tuck) Hand to back of head without a chin tuck)   Elbow flexion WNL     Elbow extension WNL     Wrist flexion WNL     Wrist extension WNL     Wrist ulnar deviation      Wrist radial deviation      Wrist pronation WNL     Wrist supination WNL     (Blank rows = not tested)  R hand: Able to oppose each digit to thumb with extra time and able to make full composite fist  UPPER EXTREMITY MMT:     MMT Right eval Right 03/26/23 Right 04/16/23 Left Eval 5/5  Shoulder flexion 3- 3- 3+   Shoulder abduction 3- 3- 3+   Shoulder adduction      Shoulder extension      Shoulder internal rotation 3+ 3+ 4-   Shoulder external rotation 3+ 3+ 3+   Middle trapezius      Lower trapezius  Elbow flexion 4- 4- 4   Elbow extension 4 4 4+   Wrist flexion 4- 4- 4   Wrist extension 3+ 4- 4   Wrist ulnar deviation      Wrist radial deviation      Wrist pronation      Wrist supination      (Blank rows = not tested)  HAND FUNCTION: Grip strength: Right: 4 lbs; Left: 50 lbs, Lateral pinch: Right: 0 lbs, Left: 17 lbs, and 3 point pinch: Right: 0 lbs, Left: 19 lbs Grip strength: Right: 18 lbs, Lateral pinch: Right 10 lbs, 3 point pinch: Right 6 lbs 04/16/23: Grip strength: Right: 22 lbs, lateral pinch: Right 11 lbs, 3 point pinch: Right: 6 lbs   COORDINATION: Finger Nose Finger test: able to touch nose with increased time 9 Hole Peg test: Right: unable (picked up a peg with repeat trials but unable to move the peg from horizontal to vertical position to place into board) sec; Left: 24 sec 03/26/23: 5 min 11 sec 04/16/23: 4 min 54 sec   SENSATION: tingling in the R hand  Light touch: Impaired  Proprioception: WFL  EDEMA: N/A  MUSCLE TONE: RUE: Mild and Hypertonic  COGNITION: Overall cognitive status: Within functional limits for tasks assessed  VISION: Subjective report: Pt reports increased blurred vision following her stroke Baseline vision: Wears  glasses all the time  VISION ASSESSMENT: Ocular ROM: WFL Gaze preference/alignment: gaze left; slight Tracking/Visual pursuits: Able to track stimulus in all quads without difficulty Saccades: WFL Visual Fields: no apparent deficits  PERCEPTION: WFL  PRAXIS: Impaired: Motor planning  OBSERVATIONS: Pt guards RUE with flexed elbow and shoulder IR/hand to abdomen during mobility.  R scapular elevation noting increased tension in R upper trap.  TODAY'S TREATMENT:        Moist heat to R shoulder applied intermittently throughout session for pain management/muscle relaxation during simultaneous completion of stretching and neuro re-ed activities noted below.                                                                                                                        Therapeutic Exercise: Performed passive stretching throughout the RUE, including all planes for R shoulder in prep for engaging the RUE into therapeutic exercises and neuro re-ed activities as noted.    Neuro re-ed: Pt practiced RUE forward and diagonal reaching patterns working to pick up flat wooden shapes and thread them over various heights of vertical dowels on table top.  Pt was cued to slide shapes to edge of table for easier pick up, and was given verbal and visual cues for grasp patterns to engage all fingers into handling the shapes using a lumbrical grasp.  Facilitated R forearm, wrist, and hand combination movements with participation in EZ board tools.  Pt worked with long handled tool to facilitate R wrist flex/ext and forearm pron/sup in combination with gross gripping, and large base key turn using a lateral pinch x3 reps for each tool (  up/down board=1 rep). Pt required min-mod A to keep tools level on board, and required transition of large base key turn from wide to narrow strip of velcro for a smoother rotation with less resistance.  Rest breaks required between each tool.  Facilitated R hand FMC/dexterity  skills working to pick up 1 marble at a time, storing up to 5 in hand, then discarding all 5 at a time from palm.  Initial verbal and visual cues for hand position for easier pick up.  Alternated above noted activities with WB through the R hand and forearm on table top to minimize tone.    PATIENT EDUCATION: Education details: RUE neuro re-ed activities Person educated: Patient Education method: explanation, vc/visual cues Education comprehension: verbalized understanding, demonstrated understanding  HOME EXERCISE PROGRAM: Soft blue theraputty exercises  GOALS: Goals reviewed with patient? Yes  SHORT TERM GOALS: Target date: 05/28/23 (6 weeks from recert on 04/16/23)  Pt will be indep to perform HEP for increasing RUE flexibility, strength, and coordination to better engage the RUE into daily tasks. Baseline: Not yet initiated; 03/26/23: using putty and engaging the R arm as able into daily tasks; 04/16/23: pt continues with putty and engaging the RUE into ADLs; will progress when able Goal status: ongoing  LONG TERM GOALS: Target date: 07/09/23 (12 weeks from recert on 04/16/23)  Pt will increase FOTO score to 48 or better to indicate clinically relevant improvement in self perceived use of R arm for daily tasks.  Baseline: Eval: 44; 03/26/23: 48 Goal status: achieved/ongoing  2.  Pt will increase R active shoulder flexion to 100* or better to achieve functional ROM for UB ADLs and reaching for ADL supplies. Baseline: Eval: R 38 degrees (passive 60); pt reaches during ADLs only with the L arm; 5/78/46: R 88* (passive 118); 04/16/23: R 121 degrees (passive 127), pt beginning to engage the RUE into reaching for light ADL supplies Goal status: ongoing  3.  Pt will increase R grip strength to 30 or more lbs to enable use of R hand to hold and carry a grocery bag in the R hand. Baseline: Eval: R grip 4 lbs, L 50 lbs; 03/26/23: R grip 18 lbs (pt still uses L hand to hold and carry light ADL  supplies); 04/16/23: R grip 22 lbs Goal status: revised  4.  Pt will report <4/10 pain throughout the RUE to enable improved tolerance with using RUE during daily tasks.  Baseline: Eval: 6-7/10 pain throughout the R side (pt predominantly uses L hand for ADLs); 03/26/23: 6/10 pain; pt demonstrating less sensitivity to touch and tolerates stretching throughout the RUE without jumping or grimacing; 04/16/23: 5-6/10 pain, consistent with report on 03/26/23 (pt has had some medication adjustments with minimal improvement, and pt is now planning Botox at [facility] on July 10.  Goal status: ongoing  5.  Pt will increase R hand dexterity/FMC skills to manipulate clothing fasteners with bilat hands. Baseline: Eval: pt avoids clothing fasteners or manages with extra time using L hand only (R 9 hole: unable, L 24 sec); 03/26/23: pt uses the R hand as a stabilizer with min vc (9 hole peg test 5 min 11 sec); 04/16/23: pt can now button and zip with bilat hands; inconsistent to tie laces (R 9 hole peg test 4 min 54 sec). Goal status: ongoing  ASSESSMENT: CLINICAL IMPRESSION: Pt seen for 14th visit of 24 approved.  Pt reports no pain throughout the RUE this date, but does continue to require intermittent stretching and  WB through the RUE to minimize spasticity.  Pt was able to pick up and store up to 5 small marbles in hand 2 times today, but did require multiple trials d/t frequent dropping.  Good use of lumbrical grasp to pick up and place large wooden shapes over a vertical dowel.  Pt requires extra time to formulate this grasp pattern and did need to slide the shape to the edge of the table for easier pick up.  Pt continues to require frequent rest breaks when reaching with the RUE, but is demonstrating good ability to isolate RUE from trunk with initial cueing to prevent leaning when reaching.  Pt continues to make steady progress towards goals.  Pt will continue to benefit from skilled OT to reduce R shoulder stiffness,  improve RUE strength, FMC, GMC, and normalize tone throughout the RUE in order to maximize functional use of the RUE with daily tasks.   PERFORMANCE DEFICITS: in functional skills including ADLs, IADLs, coordination, dexterity, sensation, tone, ROM, strength, pain, flexibility, Fine motor control, Gross motor control, mobility, balance, body mechanics, decreased knowledge of use of DME, vision, and UE functional use.  IMPAIRMENTS: are limiting patient from ADLs, IADLs, rest and sleep, work, and leisure.   CO-MORBIDITIES: has co-morbidities such as CHF, depression  that affects occupational performance. Patient will benefit from skilled OT to address above impairments and improve overall function.  MODIFICATION OR ASSISTANCE TO COMPLETE EVALUATION: No modification of tasks or assist necessary to complete an evaluation.  OT OCCUPATIONAL PROFILE AND HISTORY: Problem focused assessment: Including review of records relating to presenting problem.  CLINICAL DECISION MAKING: Moderate - several treatment options, min-mod task modification necessary  REHAB POTENTIAL: Good  EVALUATION COMPLEXITY: Moderate    PLAN:  OT FREQUENCY: 1-2x/week (pending any visit limitations with insurance), but would benefit from 2x/week  OT DURATION: 12 weeks  PLANNED INTERVENTIONS: self care/ADL training, therapeutic exercise, therapeutic activity, neuromuscular re-education, manual therapy, passive range of motion, balance training, moist heat, cryotherapy, patient/family education, coping strategies training, and DME and/or AE instructions  RECOMMENDED OTHER SERVICES: OT recommended pt follow up with her eye doctor as she does report some increased blurred vision since her CVA  CONSULTED AND AGREED WITH PLAN OF CARE: Patient  PLAN FOR NEXT SESSION: see above  Danelle Earthly, MS, OTR/L  Otis Dials, OT 05/06/2023, 4:23 PM

## 2023-05-13 ENCOUNTER — Ambulatory Visit: Payer: Medicaid Other

## 2023-05-13 DIAGNOSIS — M6281 Muscle weakness (generalized): Secondary | ICD-10-CM | POA: Diagnosis not present

## 2023-05-13 DIAGNOSIS — R278 Other lack of coordination: Secondary | ICD-10-CM

## 2023-05-13 DIAGNOSIS — Z7689 Persons encountering health services in other specified circumstances: Secondary | ICD-10-CM | POA: Diagnosis not present

## 2023-05-14 DIAGNOSIS — Z7689 Persons encountering health services in other specified circumstances: Secondary | ICD-10-CM | POA: Diagnosis not present

## 2023-05-14 DIAGNOSIS — F320 Major depressive disorder, single episode, mild: Secondary | ICD-10-CM | POA: Diagnosis not present

## 2023-05-14 NOTE — Therapy (Signed)
OUTPATIENT OCCUPATIONAL THERAPY NEURO TREATMENT NOTE  Patient Name: Anna Tapia MRN: [id number] DOB:16-Apr-1991, 32 y.o., female Today's Date: 05/14/2023  PCP: Dr. Margarita Mail REFERRING PROVIDER: Dr. Margarita Mail   OT End of Session - 05/14/23 1534     Visit Number 15    Number of Visits 24    Date for OT Re-Evaluation 07/09/23    Authorization Type Visit 15 of 24 as of 05/13/23    Authorization - Visit Number 15    Authorization - Number of Visits 24    Progress Note Due on Visit 10    OT Start Time 1100    OT Stop Time 1145    OT Time Calculation (min) 45 min    Activity Tolerance Patient tolerated treatment well    Behavior During Therapy WFL for tasks assessed/performed            Past Medical History:  Diagnosis Date   Arteriovenous malformation of brain    a. s/p remote coiling.   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   Congenital CHF (congestive heart failure) (HCC)    a. 03/2023 Echo: EF 60-65%, no rwma, nl RV fxn, RVSP 29.73mmHg. Mild MR. Mild Ao sclerosis w/o stenosis. Ao root 38mm.   COVID 2022   Hemorrhagic stroke (HCC) 2023   a. L-sided midbrain, thalamic ICH in setting of PCA/ACA aneurysm s/p embolization.   History of broken collarbone 2020   Hydrocephalus (HCC)    a. s/p VP shunt in childhood.   Sepsis secondary to UTI (HCC) 2021   Past Surgical History:  Procedure Laterality Date   VENTRICULOPERITONEAL SHUNT     Patient Active Problem List   Diagnosis Date Noted   CHF (congestive heart failure) (HCC) 03/25/2023   Left-sided nontraumatic intracerebral hemorrhage (HCC) 08/01/2022   AVM (arteriovenous malformation) brain 07/24/2022   Asthma 07/24/2022   Major depressive disorder, recurrent episode, moderate (HCC) 02/07/2022   Sepsis secondary to UTI (HCC) 05/01/2020   VP (ventriculoperitoneal) shunt status 05/01/2020   Right ovarian cyst 05/01/2020   Elevated LFTs 05/01/2020   ONSET DATE:  Sept 2023;   REFERRING DIAG:  I63.9 (ICD-10-CM) - CVA (cerebral vascular accident) (HCC)   THERAPY DIAG:  Muscle weakness (generalized)  Other lack of coordination  Rationale for Evaluation and Treatment: Rehabilitation  SUBJECTIVE:  SUBJECTIVE STATEMENT: Pt reports she will go for her Botox consult tomorrow at [facility]. Pt accompanied by: self   PERTINENT HISTORY: Per Dr. Caralee Ates on 11/19/22: HPI Anna Tapia presents to establish care.  She is here with her mom.   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       PRECAUTIONS: None  WEIGHT BEARING RESTRICTIONS: No  PAIN: 05/13/23: No pain in RUE this date Are you having pain? Yes: NPRS scale: 4-5/10 Pain location: Entire R side, arm and leg Pain description: pins and needles in the hand, arm is achy, moving the arm causes stabbing pain Aggravating factors: movement of the arm Relieving factors: rest; pt reports medication  is minimally effective  FALLS: Has patient fallen in last 6 months? Yes. 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  Being  independent."  OBJECTIVE:   HAND DOMINANCE: Left  ADLs: Overall ADLs: mostly performed with the LUE Transfers/ambulation related to ADLs: indep/extra time Eating: cuts food 1 handed Grooming: 1 handed (L) UB Dressing: occasional help to don clothes if tired after a shower; help with straightening clothes; struggles with clothing fasteners LB Dressing: wearing slip on shoes; difficulty with clothing fasteners  Toileting: indep Bathing: modified indep; pt makes attempts to engage the R arm while bathing but reports this is difficult Tub Shower transfers: modified indep Equipment:  see above  IADLs: Shopping: pt can go shopping accompanied by a family member Light housekeeping: 1 handed Meal Prep: cooking causes fatigue; mostly L handed Community mobility: uses rollator for community mobility; uses [employer] transportation Medication management: uses weekly pill organizer; Engineer, materials: mother manages (pt states she doesn't really have any bills)  Handwriting:  N/A (pt is L hand dominant)  MOBILITY STATUS:  ambulatory without AD; pt seeing PT for LE strengthening and balance deficits  POSTURE COMMENTS:  rounded shoulders Sitting balance: Moves/returns truncal midpoint >2 inches in all planes  ACTIVITY TOLERANCE: Activity tolerance: Pain in the R side significantly limits activity tolerance.  Pt reports she can get fatigued with ADLs and IADLs.  FUNCTIONAL OUTCOME MEASURES: FOTO: 44; 48 03/26/23: 48  UPPER EXTREMITY ROM:    Active ROM Right eval Right 03/26/23 Right 04/16/23 Left Eval WNL  Shoulder flexion 38 (60) 88 (118) 121 (127)   Shoulder abduction      Shoulder adduction 42 (51) 85 (105) 98 (110)   Shoulder extension      Shoulder internal rotation  R thumb to posterior hip R thumb to lumbar spine   Shoulder external rotation Hand to back of head (scaption and chin tuck) Hand to back of head (minimal scaption and minimal chin tuck) Hand to back of head  without a chin tuck)   Elbow flexion WNL     Elbow extension WNL     Wrist flexion WNL     Wrist extension WNL     Wrist ulnar deviation      Wrist radial deviation      Wrist pronation WNL     Wrist supination WNL     (Blank rows = not tested)  R hand: Able to oppose each digit to thumb with extra time and able to make full composite fist  UPPER EXTREMITY MMT:     MMT Right eval Right 03/26/23 Right 04/16/23 Left Eval 5/5  Shoulder flexion 3- 3- 3+   Shoulder abduction 3- 3- 3+   Shoulder adduction      Shoulder extension      Shoulder internal rotation 3+ 3+ 4-   Shoulder external rotation 3+ 3+ 3+   Middle trapezius      Lower trapezius      Elbow flexion 4- 4- 4   Elbow extension 4 4 4+  Wrist flexion 4- 4- 4   Wrist extension 3+ 4- 4   Wrist ulnar deviation      Wrist radial deviation      Wrist pronation      Wrist supination      (Blank rows = not tested)  HAND FUNCTION: Grip strength: Right: 4 lbs; Left: 50 lbs, Lateral pinch: Right: 0 lbs, Left: 17 lbs, and 3 point pinch: Right: 0 lbs, Left: 19 lbs Grip strength: Right: 18 lbs, Lateral pinch: Right 10 lbs, 3 point pinch: Right 6 lbs 04/16/23: Grip strength: Right: 22 lbs, lateral pinch: Right 11 lbs, 3 point pinch: Right: 6 lbs   COORDINATION: Finger Nose Finger test: able to touch nose with increased time 9 Hole Peg test: Right: unable (picked up a peg with repeat trials but unable to move the peg from horizontal to vertical position to place into board) sec; Left: 24 sec 03/26/23: 5 min 11 sec 04/16/23: 4 min 54 sec   SENSATION: tingling in the R hand  Light touch: Impaired  Proprioception: WFL  EDEMA: N/A  MUSCLE TONE: RUE: Mild and Hypertonic  COGNITION: Overall cognitive status: Within functional limits for tasks assessed  VISION: Subjective report: Pt reports increased blurred vision following her stroke Baseline vision: Wears glasses all the time  VISION ASSESSMENT: Ocular ROM: WFL Gaze  preference/alignment: gaze left; slight Tracking/Visual pursuits: Able to track stimulus in all quads without difficulty Saccades: WFL Visual Fields: no apparent deficits  PERCEPTION: WFL  PRAXIS: Impaired: Motor planning  OBSERVATIONS: Pt guards RUE with flexed elbow and shoulder IR/hand to abdomen during mobility.  R scapular elevation noting increased tension in R upper trap.  TODAY'S TREATMENT:        Moist heat to R shoulder applied intermittently throughout session for pain management/muscle relaxation during simultaneous completion of stretching and neuro re-ed activities noted below.                                                                                                                        Therapeutic Exercise: Performed passive stretching throughout the RUE, including all planes for R shoulder in prep for engaging the RUE into therapeutic exercises and neuro re-ed activities as noted.    Neuro re-ed: Facilitated RUE FMC/GMC working to pick up washers from resistive (magnetic) dish, and completing a forward reach to place washers over a vertical dowel.  Dowel and washers were placed on table top to facilitate forward and lateral reaching patterns with a fully extended elbow.  Pt was given intermittent vc to keep back against backrest of chair to isolate RUE reaching from a trunk lean.  Pt was cued to engage the R 5th digit into a gently closed grip when grasping the washer between thumb, 2nd, and 3rd digits to counteract the mixed tone which tends to extend R 5th digit at the MCP and PIP joints.  Completed joint compressions and WB throughout the hand intermittently throughout neuro re-ed activities in order to normalize  tone throughout the RUE.    PATIENT EDUCATION: Education details: RUE neuro re-ed activities Person educated: Patient Education method: explanation, vc/visual cues Education comprehension: verbalized understanding, demonstrated understanding  HOME  EXERCISE PROGRAM: Soft blue theraputty exercises  GOALS: Goals reviewed with patient? Yes  SHORT TERM GOALS: Target date: 05/28/23 (6 weeks from recert on 04/16/23)  Pt will be indep to perform HEP for increasing RUE flexibility, strength, and coordination to better engage the RUE into daily tasks. Baseline: Not yet initiated; 03/26/23: using putty and engaging the R arm as able into daily tasks; 04/16/23: pt continues with putty and engaging the RUE into ADLs; will progress when able Goal status: ongoing  LONG TERM GOALS: Target date: 07/09/23 (12 weeks from recert on 04/16/23)  Pt will increase FOTO score to 48 or better to indicate clinically relevant improvement in self perceived use of R arm for daily tasks.  Baseline: Eval: 44; 03/26/23: 48 Goal status: achieved/ongoing  2.  Pt will increase R active shoulder flexion to 100* or better to achieve functional ROM for UB ADLs and reaching for ADL supplies. Baseline: Eval: R 38 degrees (passive 60); pt reaches during ADLs only with the L arm; 1/61/09: R 88* (passive 118); 04/16/23: R 121 degrees (passive 127), pt beginning to engage the RUE into reaching for light ADL supplies Goal status: ongoing  3.  Pt will increase R grip strength to 30 or more lbs to enable use of R hand to hold and carry a grocery bag in the R hand. Baseline: Eval: R grip 4 lbs, L 50 lbs; 03/26/23: R grip 18 lbs (pt still uses L hand to hold and carry light ADL supplies); 04/16/23: R grip 22 lbs Goal status: revised  4.  Pt will report <4/10 pain throughout the RUE to enable improved tolerance with using RUE during daily tasks.  Baseline: Eval: 6-7/10 pain throughout the R side (pt predominantly uses L hand for ADLs); 03/26/23: 6/10 pain; pt demonstrating less sensitivity to touch and tolerates stretching throughout the RUE without jumping or grimacing; 04/16/23: 5-6/10 pain, consistent with report on 03/26/23 (pt has had some medication adjustments with minimal improvement, and  pt is now planning Botox at [facility] on July 10.  Goal status: ongoing  5.  Pt will increase R hand dexterity/FMC skills to manipulate clothing fasteners with bilat hands. Baseline: Eval: pt avoids clothing fasteners or manages with extra time using L hand only (R 9 hole: unable, L 24 sec); 03/26/23: pt uses the R hand as a stabilizer with min vc (9 hole peg test 5 min 11 sec); 04/16/23: pt can now button and zip with bilat hands; inconsistent to tie laces (R 9 hole peg test 4 min 54 sec). Goal status: ongoing  ASSESSMENT: CLINICAL IMPRESSION: Pt seen for 15th visit of 24 approved.  Pt reports no pain throughout the RUE this date, but does continue to require intermittent stretching and WB through the RUE to minimize spasticity.  Pt also tolerated joint compressions well throughout the RUE to further normalize tone for neuro re-ed activities.  Pt was challenged by picking up washers from the resistive magnetic dish, but was able to manage all with extra time and cues for technique, and did experience occasional dropping of washers when trying to place them over the vertical dowel.  Pt was cued to engage the R 5th digit into a gently closed grip when grasping the washer between thumb, 2nd, and 3rd digits to counteract the mixed tone which tends to  extend R 5th digit at the MCP and PIP joints.  Pt will have her consult for Botox tomorrow at [facility].  Pt continues to make steady progress towards goals.  Pt will continue to benefit from skilled OT to reduce R shoulder stiffness, improve RUE strength, FMC, GMC, and normalize tone throughout the RUE in order to maximize functional use of the RUE with daily tasks.   PERFORMANCE DEFICITS: in functional skills including ADLs, IADLs, coordination, dexterity, sensation, tone, ROM, strength, pain, flexibility, Fine motor control, Gross motor control, mobility, balance, body mechanics, decreased knowledge of use of DME, vision, and UE functional use.  IMPAIRMENTS: are  limiting patient from ADLs, IADLs, rest and sleep, work, and leisure.   CO-MORBIDITIES: has co-morbidities such as CHF, depression  that affects occupational performance. Patient will benefit from skilled OT to address above impairments and improve overall function.  MODIFICATION OR ASSISTANCE TO COMPLETE EVALUATION: No modification of tasks or assist necessary to complete an evaluation.  OT OCCUPATIONAL PROFILE AND HISTORY: Problem focused assessment: Including review of records relating to presenting problem.  CLINICAL DECISION MAKING: Moderate - several treatment options, min-mod task modification necessary  REHAB POTENTIAL: Good  EVALUATION COMPLEXITY: Moderate    PLAN:  OT FREQUENCY: 1-2x/week (pending any visit limitations with insurance), but would benefit from 2x/week  OT DURATION: 12 weeks  PLANNED INTERVENTIONS: self care/ADL training, therapeutic exercise, therapeutic activity, neuromuscular re-education, manual therapy, passive range of motion, balance training, moist heat, cryotherapy, patient/family education, coping strategies training, and DME and/or AE instructions  RECOMMENDED OTHER SERVICES: OT recommended pt follow up with her eye doctor as she does report some increased blurred vision since her CVA  CONSULTED AND AGREED WITH PLAN OF CARE: Patient  PLAN FOR NEXT SESSION: see above  Danelle Earthly, MS, OTR/L  Otis Dials, OT 05/14/2023, 3:36 PM

## 2023-05-15 ENCOUNTER — Ambulatory Visit: Payer: Medicaid Other

## 2023-05-15 DIAGNOSIS — R278 Other lack of coordination: Secondary | ICD-10-CM

## 2023-05-15 DIAGNOSIS — M6281 Muscle weakness (generalized): Secondary | ICD-10-CM | POA: Diagnosis not present

## 2023-05-15 DIAGNOSIS — Z7689 Persons encountering health services in other specified circumstances: Secondary | ICD-10-CM | POA: Diagnosis not present

## 2023-05-18 NOTE — Therapy (Signed)
OUTPATIENT OCCUPATIONAL THERAPY NEURO TREATMENT NOTE  Patient Name: Anna Tapia MRN: [id number] DOB:[birth year], 32 y.o., female Today's Date: 05/18/2023  PCP: Dr. Margarita Mail REFERRING PROVIDER: Dr. Margarita Mail   OT End of Session - 05/18/23 1227     Visit Number 16    Number of Visits 24    Date for OT Re-Evaluation 07/09/23    Authorization Type Visit 16 of 24 as of 05/15/23    Authorization - Visit Number 16    Authorization - Number of Visits 24    Progress Note Due on Visit 10    OT Start Time 1400    OT Stop Time 1445    OT Time Calculation (min) 45 min    Activity Tolerance Patient tolerated treatment well    Behavior During Therapy WFL for tasks assessed/performed            Past Medical History:  Diagnosis Date   Arteriovenous malformation of brain    a. s/p remote coiling.   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   Congenital CHF (congestive heart failure) (HCC)    a. 03/2023 Echo: EF 60-65%, no rwma, nl RV fxn, RVSP 29.75mmHg. Mild MR. Mild Ao sclerosis w/o stenosis. Ao root 38mm.   COVID 2022   Hemorrhagic stroke (HCC) 2023   a. L-sided midbrain, thalamic ICH in setting of PCA/ACA aneurysm s/p embolization.   History of broken collarbone 2020   Hydrocephalus (HCC)    a. s/p VP shunt in childhood.   Sepsis secondary to UTI (HCC) 2021   Past Surgical History:  Procedure Laterality Date   VENTRICULOPERITONEAL SHUNT     Patient Active Problem List   Diagnosis Date Noted   CHF (congestive heart failure) (HCC) 03/25/2023   Left-sided nontraumatic intracerebral hemorrhage (HCC) 08/01/2022   AVM (arteriovenous malformation) brain 07/24/2022   Asthma 07/24/2022   Major depressive disorder, recurrent episode, moderate (HCC) 02/07/2022   Sepsis secondary to UTI (HCC) 05/01/2020   VP (ventriculoperitoneal) shunt status 05/01/2020   Right ovarian cyst 05/01/2020   Elevated LFTs 05/01/2020   ONSET DATE:  Sept 2023;   REFERRING DIAG:  I63.9 (ICD-10-CM) - CVA (cerebral vascular accident) (HCC)   THERAPY DIAG:  Muscle weakness (generalized)  Other lack of coordination  Rationale for Evaluation and Treatment: Rehabilitation  SUBJECTIVE:  SUBJECTIVE STATEMENT: Pt reports she had a good visit at [facility] this week and pt will have her first Botox injections at the end of this month.  Pt will also start some medication changes over the next week; see clinical impression below. Pt accompanied by: self   PERTINENT HISTORY: Per Dr. Caralee Ates on 11/19/22: HPI [name] presents to establish care.  She is here with her mom.   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       PRECAUTIONS: None  WEIGHT BEARING RESTRICTIONS: No  PAIN: 05/15/23: No pain in RUE this date Are you having pain? Yes: NPRS scale: 4-5/10 Pain location: Entire R side, arm and leg  Pain description: pins and needles in the hand, arm is achy, moving the arm causes stabbing pain Aggravating factors: movement of the arm Relieving factors: rest; pt reports medication is minimally effective  FALLS: Has patient fallen in last 6 months? Yes. 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  Being independent."  OBJECTIVE:   HAND DOMINANCE: Left  ADLs: Overall ADLs: mostly performed with the LUE Transfers/ambulation related to ADLs: indep/extra time Eating: cuts food 1 handed Grooming: 1 handed (L) UB Dressing: occasional help to don clothes if tired after a shower; help with straightening clothes; struggles with clothing fasteners LB Dressing: wearing slip on shoes; difficulty with clothing fasteners  Toileting: indep Bathing: modified indep; pt makes attempts to engage the R arm while bathing but reports this is difficult Tub Shower transfers: modified indep Equipment:  see above  IADLs: Shopping: pt can go shopping accompanied by a family member Light housekeeping: 1 handed Meal Prep: cooking causes fatigue; mostly L handed Community mobility: uses rollator for community mobility; uses [employer] transportation Medication management: uses weekly pill organizer; Engineer, materials: mother manages (pt states she doesn't really have any bills)  Handwriting:  N/A (pt is L hand dominant)  MOBILITY STATUS:  ambulatory without AD; pt seeing PT for LE strengthening and balance deficits  POSTURE COMMENTS:  rounded shoulders Sitting balance: Moves/returns truncal midpoint >2 inches in all planes  ACTIVITY TOLERANCE: Activity tolerance: Pain in the R side significantly limits activity tolerance.  Pt reports she can get fatigued with ADLs and IADLs.  FUNCTIONAL OUTCOME MEASURES: FOTO: 44; 48 03/26/23: 48  UPPER EXTREMITY ROM:    Active ROM Right eval Right 03/26/23 Right 04/16/23 Left Eval WNL  Shoulder flexion 38 (60) 88 (118) 121 (127)   Shoulder abduction      Shoulder adduction 42 (51) 85 (105) 98 (110)   Shoulder extension      Shoulder internal rotation  R thumb to posterior hip R thumb to lumbar spine   Shoulder external rotation  Hand to back of head (scaption and chin tuck) Hand to back of head (minimal scaption and minimal chin tuck) Hand to back of head without a chin tuck)   Elbow flexion WNL     Elbow extension WNL     Wrist flexion WNL     Wrist extension WNL     Wrist ulnar deviation      Wrist radial deviation      Wrist pronation WNL     Wrist supination WNL     (Blank rows = not tested)  R hand: Able to oppose each digit to thumb with extra time and able to make full composite fist  UPPER EXTREMITY MMT:     MMT Right eval Right 03/26/23 Right 04/16/23 Left Eval 5/5  Shoulder flexion 3- 3- 3+   Shoulder abduction 3- 3- 3+   Shoulder adduction      Shoulder extension      Shoulder internal rotation 3+ 3+ 4-   Shoulder external rotation 3+ 3+  3+   Middle trapezius      Lower trapezius      Elbow flexion 4- 4- 4   Elbow extension 4 4 4+   Wrist flexion 4- 4- 4   Wrist extension 3+ 4- 4   Wrist ulnar deviation      Wrist radial deviation      Wrist pronation      Wrist supination      (Blank rows = not tested)  HAND FUNCTION: Grip strength: Right: 4 lbs; Left: 50 lbs, Lateral pinch: Right: 0 lbs, Left: 17 lbs, and 3 point pinch: Right: 0 lbs, Left: 19 lbs Grip strength: Right: 18 lbs, Lateral pinch: Right 10 lbs, 3 point pinch: Right 6 lbs 04/16/23: Grip strength: Right: 22 lbs, lateral pinch: Right 11 lbs, 3 point pinch: Right: 6 lbs   COORDINATION: Finger Nose Finger test: able to touch nose with increased time 9 Hole Peg test: Right: unable (picked up a peg with repeat trials but unable to move the peg from horizontal to vertical position to place into board) sec; Left: 24 sec 03/26/23: 5 min 11 sec 04/16/23: 4 min 54 sec   SENSATION: tingling in the R hand  Light touch: Impaired  Proprioception: WFL  EDEMA: N/A  MUSCLE TONE: RUE: Mild and Hypertonic  COGNITION: Overall cognitive status: Within functional limits for tasks assessed  VISION: Subjective report: Pt reports  increased blurred vision following her stroke Baseline vision: Wears glasses all the time  VISION ASSESSMENT: Ocular ROM: WFL Gaze preference/alignment: gaze left; slight Tracking/Visual pursuits: Able to track stimulus in all quads without difficulty Saccades: WFL Visual Fields: no apparent deficits  PERCEPTION: WFL  PRAXIS: Impaired: Motor planning  OBSERVATIONS: Pt guards RUE with flexed elbow and shoulder IR/hand to abdomen during mobility.  R scapular elevation noting increased tension in R upper trap.  TODAY'S TREATMENT:        Moist heat to R shoulder applied intermittently throughout session for pain management/muscle relaxation during simultaneous completion of stretching and neuro re-ed activities noted below.                                                                                                                        Therapeutic Exercise: Performed passive stretching throughout the RUE, including all planes for R shoulder in prep for engaging the RUE into therapeutic exercises and neuro re-ed activities as noted.  Facilitated pinch strengthening with use of therapy resistant clothespins to target lateral and 3 point pinch of R hand.  Pt practiced unclipping pins from a vertical dowel, then was able to progress to clipping pins onto same dowel, requiring intermittent cues for form while reaching to isolate arm from trunk and verbal and visual cues for pinch patterns.    Neuro re-ed: Facilitated R forearm, wrist, and hand combination movements with participation in EZ board tools.  Pt worked with long handled tool to facilitate R wrist flex/ext and forearm pron/sup, large base key turn, and large dial  turn for x2 reps for each tool (up/down board=1 rep).  Rest breaks between sets and min-mod vc for technique to maximize ROM with each tool rotation against wide strip of velcro resistance.  Min-mod A to maintain tool level on board.  Alternated therapeutic exercises with WB  throughout the hand, joint compressions through the wrist, elbow, and shoulder, and passive stretching for tone reduction throughout the RUE.   PATIENT EDUCATION: Education details: RUE neuro re-ed and strengthening activities Person educated: Patient Education method: explanation, vc/visual cues Education comprehension: verbalized understanding, demonstrated understanding  HOME EXERCISE PROGRAM: Soft blue theraputty exercises  GOALS: Goals reviewed with patient? Yes  SHORT TERM GOALS: Target date: 05/28/23 (6 weeks from recert on 04/16/23)  Pt will be indep to perform HEP for increasing RUE flexibility, strength, and coordination to better engage the RUE into daily tasks. Baseline: Not yet initiated; 03/26/23: using putty and engaging the R arm as able into daily tasks; 04/16/23: pt continues with putty and engaging the RUE into ADLs; will progress when able Goal status: ongoing  LONG TERM GOALS: Target date: 07/09/23 (12 weeks from recert on 04/16/23)  Pt will increase FOTO score to 48 or better to indicate clinically relevant improvement in self perceived use of R arm for daily tasks.  Baseline: Eval: 44; 03/26/23: 48 Goal status: achieved/ongoing  2.  Pt will increase R active shoulder flexion to 100* or better to achieve functional ROM for UB ADLs and reaching for ADL supplies. Baseline: Eval: R 38 degrees (passive 60); pt reaches during ADLs only with the L arm; 7/82/95: R 88* (passive 118); 04/16/23: R 121 degrees (passive 127), pt beginning to engage the RUE into reaching for light ADL supplies Goal status: ongoing  3.  Pt will increase R grip strength to 30 or more lbs to enable use of R hand to hold and carry a grocery bag in the R hand. Baseline: Eval: R grip 4 lbs, L 50 lbs; 03/26/23: R grip 18 lbs (pt still uses L hand to hold and carry light ADL supplies); 04/16/23: R grip 22 lbs Goal status: revised  4.  Pt will report <4/10 pain throughout the RUE to enable improved tolerance  with using RUE during daily tasks.  Baseline: Eval: 6-7/10 pain throughout the R side (pt predominantly uses L hand for ADLs); 03/26/23: 6/10 pain; pt demonstrating less sensitivity to touch and tolerates stretching throughout the RUE without jumping or grimacing; 04/16/23: 5-6/10 pain, consistent with report on 03/26/23 (pt has had some medication adjustments with minimal improvement, and pt is now planning Botox at [facility] on July 10.  Goal status: ongoing  5.  Pt will increase R hand dexterity/FMC skills to manipulate clothing fasteners with bilat hands. Baseline: Eval: pt avoids clothing fasteners or manages with extra time using L hand only (R 9 hole: unable, L 24 sec); 03/26/23: pt uses the R hand as a stabilizer with min vc (9 hole peg test 5 min 11 sec); 04/16/23: pt can now button and zip with bilat hands; inconsistent to tie laces (R 9 hole peg test 4 min 54 sec). Goal status: ongoing  ASSESSMENT: CLINICAL IMPRESSION: Pt seen for 16th visit of 24 approved.  Pt reports no pain throughout the RUE this date, but does continue to require intermittent stretching and WB through the RUE to minimize spasticity.  Pt also tolerated joint compressions well throughout the RUE to further normalize tone for neuro re-ed and therapeutic exercises.  Pt reported that she had a good  visit with her MD at [facility] this week.  Planning first round of Botox injections at this end of this month, and pt reports she will get them in her R arm, R leg, and back.  Pt also communicated some medication changes as follows.  Pt will continue to take her Baclofen for the same mg during the day, but will spread the dose over the course of 3 times per day instead of 2x per day.  Pt also reports that she will titrate from her Fluoxetine over the next week and change to Citalopram to see if the tremor subsides in the R hand.  Pt continues to make steady progress towards goals.  Pt will continue to benefit from skilled OT to reduce R shoulder  stiffness, improve RUE strength, FMC, GMC, and normalize tone throughout the RUE in order to maximize functional use of the RUE with daily tasks.   PERFORMANCE DEFICITS: in functional skills including ADLs, IADLs, coordination, dexterity, sensation, tone, ROM, strength, pain, flexibility, Fine motor control, Gross motor control, mobility, balance, body mechanics, decreased knowledge of use of DME, vision, and UE functional use.  IMPAIRMENTS: are limiting patient from ADLs, IADLs, rest and sleep, work, and leisure.   CO-MORBIDITIES: has co-morbidities such as CHF, depression  that affects occupational performance. Patient will benefit from skilled OT to address above impairments and improve overall function.  MODIFICATION OR ASSISTANCE TO COMPLETE EVALUATION: No modification of tasks or assist necessary to complete an evaluation.  OT OCCUPATIONAL PROFILE AND HISTORY: Problem focused assessment: Including review of records relating to presenting problem.  CLINICAL DECISION MAKING: Moderate - several treatment options, min-mod task modification necessary  REHAB POTENTIAL: Good  EVALUATION COMPLEXITY: Moderate    PLAN:  OT FREQUENCY: 1-2x/week (pending any visit limitations with insurance), but would benefit from 2x/week  OT DURATION: 12 weeks  PLANNED INTERVENTIONS: self care/ADL training, therapeutic exercise, therapeutic activity, neuromuscular re-education, manual therapy, passive range of motion, balance training, moist heat, cryotherapy, patient/family education, coping strategies training, and DME and/or AE instructions  RECOMMENDED OTHER SERVICES: OT recommended pt follow up with her eye doctor as she does report some increased blurred vision since her CVA  CONSULTED AND AGREED WITH PLAN OF CARE: Patient  PLAN FOR NEXT SESSION: see above  Danelle Earthly, MS, OTR/L  Otis Dials, OT 05/18/2023, 12:28 PM

## 2023-05-19 ENCOUNTER — Ambulatory Visit: Payer: Medicaid Other

## 2023-05-19 DIAGNOSIS — M6281 Muscle weakness (generalized): Secondary | ICD-10-CM

## 2023-05-19 DIAGNOSIS — Z7689 Persons encountering health services in other specified circumstances: Secondary | ICD-10-CM | POA: Diagnosis not present

## 2023-05-19 DIAGNOSIS — R278 Other lack of coordination: Secondary | ICD-10-CM

## 2023-05-19 NOTE — Therapy (Signed)
OUTPATIENT OCCUPATIONAL THERAPY NEURO TREATMENT NOTE  Patient Name: Anna Tapia MRN: [id number] DOB:07/08/1991, 32 y.o., female Today's Date: 05/19/2023  PCP: Dr. Margarita Mail REFERRING PROVIDER: Dr. Margarita Mail   OT End of Session - 05/19/23 1015     Visit Number 17    Number of Visits 24    Date for OT Re-Evaluation 07/09/23    Authorization Type Visit 17 of 24 as of 05/19/23    Authorization - Visit Number 17    Authorization - Number of Visits 24    Progress Note Due on Visit 10    OT Start Time 1015    OT Stop Time 1100    OT Time Calculation (min) 45 min    Activity Tolerance Patient tolerated treatment well    Behavior During Therapy WFL for tasks assessed/performed            Past Medical History:  Diagnosis Date   Arteriovenous malformation of brain    a. s/p remote coiling.   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   Congenital CHF (congestive heart failure) (HCC)    a. 03/2023 Echo: EF 60-65%, no rwma, nl RV fxn, RVSP 29.51mmHg. Mild MR. Mild Ao sclerosis w/o stenosis. Ao root 38mm.   COVID 2022   Hemorrhagic stroke (HCC) 2023   a. L-sided midbrain, thalamic ICH in setting of PCA/ACA aneurysm s/p embolization.   History of broken collarbone 2020   Hydrocephalus (HCC)    a. s/p VP shunt in childhood.   Sepsis secondary to UTI (HCC) 2021   Past Surgical History:  Procedure Laterality Date   VENTRICULOPERITONEAL SHUNT     Patient Active Problem List   Diagnosis Date Noted   CHF (congestive heart failure) (HCC) 03/25/2023   Left-sided nontraumatic intracerebral hemorrhage (HCC) 08/01/2022   AVM (arteriovenous malformation) brain 07/24/2022   Asthma 07/24/2022   Major depressive disorder, recurrent episode, moderate (HCC) 02/07/2022   Sepsis secondary to UTI (HCC) 05/01/2020   VP (ventriculoperitoneal) shunt status 05/01/2020   Right ovarian cyst 05/01/2020   Elevated LFTs 05/01/2020   ONSET DATE:  Sept 2023;   REFERRING DIAG:  I63.9 (ICD-10-CM) - CVA (cerebral vascular accident) (HCC)   THERAPY DIAG:  Muscle weakness (generalized)  Other lack of coordination  Rationale for Evaluation and Treatment: Rehabilitation  SUBJECTIVE:  SUBJECTIVE STATEMENT: Pt reports no pain today, just some stiffness throughout the RUE. Pt accompanied by: self   PERTINENT HISTORY: Per Dr. Caralee Ates on 11/19/22: HPI [name] presents to establish care.  She is here with her mom.   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       PRECAUTIONS: None  WEIGHT BEARING RESTRICTIONS: No  PAIN: 05/19/23: No pain in RUE this date Are you having pain? Yes: NPRS scale: 4-5/10 Pain location: Entire R side, arm and leg Pain description: pins and needles in the hand, arm is achy, moving the arm causes stabbing pain Aggravating factors: movement of the arm Relieving factors: rest; pt reports medication is  minimally effective  FALLS: Has patient fallen in last 6 months? Yes. 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  Being  independent."  OBJECTIVE:   HAND DOMINANCE: Left  ADLs: Overall ADLs: mostly performed with the LUE Transfers/ambulation related to ADLs: indep/extra time Eating: cuts food 1 handed Grooming: 1 handed (L) UB Dressing: occasional help to don clothes if tired after a shower; help with straightening clothes; struggles with clothing fasteners LB Dressing: wearing slip on shoes; difficulty with clothing fasteners  Toileting: indep Bathing: modified indep; pt makes attempts to engage the R arm while bathing but reports this is difficult Tub Shower transfers: modified indep Equipment:  see above  IADLs: Shopping: pt can go shopping accompanied by a family member Light housekeeping: 1 handed Meal Prep: cooking causes fatigue; mostly L handed Community mobility: uses rollator for community mobility; uses [employer] transportation Medication management: uses weekly pill organizer; Engineer, materials: mother manages (pt states she doesn't really have any bills)  Handwriting:  N/A (pt is L hand dominant)  MOBILITY STATUS:  ambulatory without AD; pt seeing PT for LE strengthening and balance deficits  POSTURE COMMENTS:  rounded shoulders Sitting balance: Moves/returns truncal midpoint >2 inches in all planes  ACTIVITY TOLERANCE: Activity tolerance: Pain in the R side significantly limits activity tolerance.  Pt reports she can get fatigued with ADLs and IADLs.  FUNCTIONAL OUTCOME MEASURES: FOTO: 44; 48 03/26/23: 48  UPPER EXTREMITY ROM:    Active ROM Right eval Right 03/26/23 Right 04/16/23 Left Eval WNL  Shoulder flexion 38 (60) 88 (118) 121 (127)   Shoulder abduction      Shoulder adduction 42 (51) 85 (105) 98 (110)   Shoulder extension      Shoulder internal rotation  R thumb to posterior hip R thumb to lumbar spine   Shoulder external rotation Hand to back of head (scaption and chin tuck) Hand to back of head (minimal scaption and minimal chin tuck) Hand to back of head  without a chin tuck)   Elbow flexion WNL     Elbow extension WNL     Wrist flexion WNL     Wrist extension WNL     Wrist ulnar deviation      Wrist radial deviation      Wrist pronation WNL     Wrist supination WNL     (Blank rows = not tested)  R hand: Able to oppose each digit to thumb with extra time and able to make full composite fist  UPPER EXTREMITY MMT:     MMT Right eval Right 03/26/23 Right 04/16/23 Left Eval 5/5  Shoulder flexion 3- 3- 3+   Shoulder abduction 3- 3- 3+   Shoulder adduction      Shoulder extension      Shoulder internal rotation 3+ 3+ 4-   Shoulder external rotation 3+ 3+ 3+   Middle trapezius      Lower trapezius      Elbow flexion 4- 4- 4   Elbow extension 4 4 4+  Wrist flexion 4- 4- 4   Wrist extension 3+ 4- 4   Wrist ulnar deviation      Wrist radial deviation      Wrist pronation      Wrist supination      (Blank rows = not tested)  HAND FUNCTION: Grip strength: Right: 4 lbs; Left: 50 lbs, Lateral pinch: Right: 0 lbs, Left: 17 lbs, and 3 point pinch: Right: 0 lbs, Left: 19 lbs Grip strength: Right: 18 lbs, Lateral pinch: Right 10 lbs, 3 point pinch: Right 6 lbs 04/16/23: Grip strength: Right: 22 lbs, lateral pinch: Right 11 lbs, 3 point pinch: Right: 6 lbs   COORDINATION: Finger Nose Finger test: able to touch nose with increased time 9 Hole Peg test: Right: unable (picked up a peg with repeat trials but unable to move the peg from horizontal to vertical position to place into board) sec; Left: 24 sec 03/26/23: 5 min 11 sec 04/16/23: 4 min 54 sec   SENSATION: tingling in the R hand  Light touch: Impaired  Proprioception: WFL  EDEMA: N/A  MUSCLE TONE: RUE: Mild and Hypertonic  COGNITION: Overall cognitive status: Within functional limits for tasks assessed  VISION: Subjective report: Pt reports increased blurred vision following her stroke Baseline vision: Wears glasses all the time  VISION ASSESSMENT: Ocular ROM: WFL Gaze  preference/alignment: gaze left; slight Tracking/Visual pursuits: Able to track stimulus in all quads without difficulty Saccades: WFL Visual Fields: no apparent deficits  PERCEPTION: WFL  PRAXIS: Impaired: Motor planning  OBSERVATIONS: Pt guards RUE with flexed elbow and shoulder IR/hand to abdomen during mobility.  R scapular elevation noting increased tension in R upper trap.  TODAY'S TREATMENT:        Moist heat to R shoulder applied intermittently throughout session for pain management/muscle relaxation during simultaneous completion of stretching and neuro re-ed activities noted below.                                                                                                                        Therapeutic Exercise: Performed passive stretching throughout the RUE, including all planes for R shoulder in prep for engaging the RUE into therapeutic exercises and neuro re-ed activities as noted.  Facilitated pinch strengthening with use of therapy resistant clothespins to target lateral and 3 point pinch of R hand.  Pt practiced  clipping pins onto vertical dowel, requiring intermittent cues for form while reaching to isolate arm from trunk and verbal and visual cues for pinch patterns.    Neuro re-ed: Facilitated R FMC/dexterity skills and forward reaching patterns working to pick up washers from resistive (magnetic) dish, and completing a forward reach to place washers over a vertical dowel. Dowel and washers were placed on table top to facilitate forward and lateral reaching patterns with a fully extended elbow. Pt was given intermittent vc to keep back against backrest of chair to isolate RUE reaching from a trunk lean. Pt was cued to engage the R 5th digit  into a gently closed grip when grasping the washer between thumb, 2nd, and 3rd digits to counteract the mixed tone which tends to extend R 5th digit at the MCP and PIP joints.  Alternated therapeutic exercises and FMC/dexterity  activities with WB throughout the hand, joint compressions through the wrist, elbow, and shoulder, and passive stretching for tone reduction throughout the RUE.    PATIENT EDUCATION: Education details: RUE neuro re-ed and strengthening activities Person educated: Patient Education method: explanation, vc/visual cues Education comprehension: verbalized understanding, demonstrated understanding  HOME EXERCISE PROGRAM: Soft blue theraputty exercises  GOALS: Goals reviewed with patient? Yes  SHORT TERM GOALS: Target date: 05/28/23 (6 weeks from recert on 04/16/23)  Pt will be indep to perform HEP for increasing RUE flexibility, strength, and coordination to better engage the RUE into daily tasks. Baseline: Not yet initiated; 03/26/23: using putty and engaging the R arm as able into daily tasks; 04/16/23: pt continues with putty and engaging the RUE into ADLs; will progress when able Goal status: ongoing  LONG TERM GOALS: Target date: 07/09/23 (12 weeks from recert on 04/16/23)  Pt will increase FOTO score to 48 or better to indicate clinically relevant improvement in self perceived use of R arm for daily tasks.  Baseline: Eval: 44; 03/26/23: 48 Goal status: achieved/ongoing  2.  Pt will increase R active shoulder flexion to 100* or better to achieve functional ROM for UB ADLs and reaching for ADL supplies. Baseline: Eval: R 38 degrees (passive 60); pt reaches during ADLs only with the L arm; 2/95/28: R 88* (passive 118); 04/16/23: R 121 degrees (passive 127), pt beginning to engage the RUE into reaching for light ADL supplies Goal status: ongoing  3.  Pt will increase R grip strength to 30 or more lbs to enable use of R hand to hold and carry a grocery bag in the R hand. Baseline: Eval: R grip 4 lbs, L 50 lbs; 03/26/23: R grip 18 lbs (pt still uses L hand to hold and carry light ADL supplies); 04/16/23: R grip 22 lbs Goal status: revised  4.  Pt will report <4/10 pain throughout the RUE to enable  improved tolerance with using RUE during daily tasks.  Baseline: Eval: 6-7/10 pain throughout the R side (pt predominantly uses L hand for ADLs); 03/26/23: 6/10 pain; pt demonstrating less sensitivity to touch and tolerates stretching throughout the RUE without jumping or grimacing; 04/16/23: 5-6/10 pain, consistent with report on 03/26/23 (pt has had some medication adjustments with minimal improvement, and pt is now planning Botox at [facility] on July 10.  Goal status: ongoing  5.  Pt will increase R hand dexterity/FMC skills to manipulate clothing fasteners with bilat hands. Baseline: Eval: pt avoids clothing fasteners or manages with extra time using L hand only (R 9 hole: unable, L 24 sec); 03/26/23: pt uses the R hand as a stabilizer with min vc (9 hole peg test 5 min 11 sec); 04/16/23: pt can now button and zip with bilat hands; inconsistent to tie laces (R 9 hole peg test 4 min 54 sec). Goal status: ongoing  ASSESSMENT: CLINICAL IMPRESSION: Pt seen for 17th visit of 24 approved.  Noted improvement in the R hand tremor this date since pt stopped taking her Fluoxetine last Thurs.  Pt reports she will start her Citalopram this Thurs to replace the latter.  Pt worked with therapy resistant clothespins, successfully clipping and unclipping yellow, red, and green clips from a vertical dowel, inconsistently able to clip the blue  pins, but not yet the black most resistant pins.  Pt improving with her ability to manipulate washers with the R hand, sliding them out of a magnetic dish and threading them over a vertical dowel.  Pt dropped more washers towards end of session d/t RUE fatigue.  Pt continues to tolerate joint compressions well throughout the RUE to further normalize tone for neuro re-ed and therapeutic exercises.  Pt will continue to benefit from skilled OT to reduce R shoulder stiffness, improve RUE strength, FMC, GMC, and normalize tone throughout the RUE in order to maximize functional use of the RUE  with daily tasks.   PERFORMANCE DEFICITS: in functional skills including ADLs, IADLs, coordination, dexterity, sensation, tone, ROM, strength, pain, flexibility, Fine motor control, Gross motor control, mobility, balance, body mechanics, decreased knowledge of use of DME, vision, and UE functional use.  IMPAIRMENTS: are limiting patient from ADLs, IADLs, rest and sleep, work, and leisure.   CO-MORBIDITIES: has co-morbidities such as CHF, depression  that affects occupational performance. Patient will benefit from skilled OT to address above impairments and improve overall function.  MODIFICATION OR ASSISTANCE TO COMPLETE EVALUATION: No modification of tasks or assist necessary to complete an evaluation.  OT OCCUPATIONAL PROFILE AND HISTORY: Problem focused assessment: Including review of records relating to presenting problem.  CLINICAL DECISION MAKING: Moderate - several treatment options, min-mod task modification necessary  REHAB POTENTIAL: Good  EVALUATION COMPLEXITY: Moderate    PLAN:  OT FREQUENCY: 1-2x/week (pending any visit limitations with insurance), but would benefit from 2x/week  OT DURATION: 12 weeks  PLANNED INTERVENTIONS: self care/ADL training, therapeutic exercise, therapeutic activity, neuromuscular re-education, manual therapy, passive range of motion, balance training, moist heat, cryotherapy, patient/family education, coping strategies training, and DME and/or AE instructions  RECOMMENDED OTHER SERVICES: OT recommended pt follow up with her eye doctor as she does report some increased blurred vision since her CVA  CONSULTED AND AGREED WITH PLAN OF CARE: Patient  PLAN FOR NEXT SESSION: see above  Danelle Earthly, MS, OTR/L  Otis Dials, OT 05/19/2023, 10:24 AM

## 2023-05-21 ENCOUNTER — Ambulatory Visit: Payer: Medicaid Other

## 2023-05-21 DIAGNOSIS — M6281 Muscle weakness (generalized): Secondary | ICD-10-CM

## 2023-05-21 DIAGNOSIS — R278 Other lack of coordination: Secondary | ICD-10-CM

## 2023-05-21 DIAGNOSIS — Z7689 Persons encountering health services in other specified circumstances: Secondary | ICD-10-CM | POA: Diagnosis not present

## 2023-05-21 NOTE — Therapy (Signed)
OUTPATIENT OCCUPATIONAL THERAPY NEURO TREATMENT NOTE  Patient Name: Anna Tapia MRN: [id number] DOB:14-Aug-1991, 32 y.o., female Today's Date: 05/21/2023  PCP: Dr. Margarita Mail REFERRING PROVIDER: Dr. Margarita Mail   OT End of Session - 05/21/23 1054     Visit Number 18    Number of Visits 24    Date for OT Re-Evaluation 07/09/23    Authorization Type Visit 18 of 24 as of 05/21/23    Authorization - Visit Number 18    Authorization - Number of Visits 24    Progress Note Due on Visit 10    OT Start Time 1015    OT Stop Time 1100    OT Time Calculation (min) 45 min    Activity Tolerance Patient tolerated treatment well    Behavior During Therapy WFL for tasks assessed/performed            Past Medical History:  Diagnosis Date   Arteriovenous malformation of brain    a. s/p remote coiling.   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   Congenital CHF (congestive heart failure) (HCC)    a. 03/2023 Echo: EF 60-65%, no rwma, nl RV fxn, RVSP 29.77mmHg. Mild MR. Mild Ao sclerosis w/o stenosis. Ao root 38mm.   COVID 2022   Hemorrhagic stroke (HCC) 2023   a. L-sided midbrain, thalamic ICH in setting of PCA/ACA aneurysm s/p embolization.   History of broken collarbone 2020   Hydrocephalus (HCC)    a. s/p VP shunt in childhood.   Sepsis secondary to UTI (HCC) 2021   Past Surgical History:  Procedure Laterality Date   VENTRICULOPERITONEAL SHUNT     Patient Active Problem List   Diagnosis Date Noted   CHF (congestive heart failure) (HCC) 03/25/2023   Left-sided nontraumatic intracerebral hemorrhage (HCC) 08/01/2022   AVM (arteriovenous malformation) brain 07/24/2022   Asthma 07/24/2022   Major depressive disorder, recurrent episode, moderate (HCC) 02/07/2022   Sepsis secondary to UTI (HCC) 05/01/2020   VP (ventriculoperitoneal) shunt status 05/01/2020   Right ovarian cyst 05/01/2020   Elevated LFTs 05/01/2020   ONSET DATE:  Sept 2023;   REFERRING DIAG:  I63.9 (ICD-10-CM) - CVA (cerebral vascular accident) (HCC)   THERAPY DIAG:  Muscle weakness (generalized)  Other lack of coordination  Rationale for Evaluation and Treatment: Rehabilitation  SUBJECTIVE:  SUBJECTIVE STATEMENT: Pt reports that she has a neurology appointment at [facility] tomorrow for a follow up post CVA. Pt accompanied by: self   PERTINENT HISTORY: Per Dr. Caralee Ates on 11/19/22: HPI Anna Tapia presents to establish care.  She is here with her mom.   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       PRECAUTIONS: None  WEIGHT BEARING RESTRICTIONS: No  PAIN: 05/21/23: No pain in RUE this date Are you having pain? Yes: NPRS scale: 4-5/10 Pain location: Entire R side, arm and leg Pain description: pins and needles in the hand, arm is achy, moving the arm causes stabbing pain Aggravating factors: movement of the arm Relieving  factors: rest; pt reports medication is minimally effective  FALLS: Has patient fallen in last 6 months? Yes. 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  Being independent."  OBJECTIVE:   HAND DOMINANCE: Left  ADLs: Overall ADLs: mostly performed with the LUE Transfers/ambulation related to ADLs: indep/extra time Eating: cuts food 1 handed Grooming: 1 handed (L) UB Dressing: occasional help to don clothes if tired after a shower; help with straightening clothes; struggles with clothing fasteners LB Dressing: wearing slip on shoes; difficulty with clothing fasteners  Toileting: indep Bathing: modified indep; pt makes attempts to engage the R arm while bathing but reports this is difficult Tub Shower transfers: modified indep Equipment:  see above  IADLs: Shopping: pt can go shopping accompanied by a family member Light housekeeping: 1 handed Meal Prep: cooking causes fatigue; mostly L handed Community mobility: uses rollator for community mobility; uses [employer] transportation Medication management: uses weekly pill organizer; Engineer, materials: mother manages (pt states she doesn't really have any bills)  Handwriting:  N/A (pt is L hand dominant)  MOBILITY STATUS:  ambulatory without AD; pt seeing PT for LE strengthening and balance deficits  POSTURE COMMENTS:  rounded shoulders Sitting balance: Moves/returns truncal midpoint >2 inches in all planes  ACTIVITY TOLERANCE: Activity tolerance: Pain in the R side significantly limits activity tolerance.  Pt reports she can get fatigued with ADLs and IADLs.  FUNCTIONAL OUTCOME MEASURES: FOTO: 44; 48 03/26/23: 48  UPPER EXTREMITY ROM:    Active ROM Right eval Right 03/26/23 Right 04/16/23 Left Eval WNL  Shoulder flexion 38 (60) 88 (118) 121 (127)   Shoulder abduction      Shoulder adduction 42 (51) 85 (105) 98 (110)   Shoulder extension      Shoulder internal rotation  R thumb to posterior hip R thumb to lumbar spine   Shoulder external rotation Hand to back of head (scaption and chin tuck) Hand to back of head (minimal scaption and minimal chin tuck) Hand to back  of head without a chin tuck)   Elbow flexion WNL     Elbow extension WNL     Wrist flexion WNL     Wrist extension WNL     Wrist ulnar deviation      Wrist radial deviation      Wrist pronation WNL     Wrist supination WNL     (Blank rows = not tested)  R hand: Able to oppose each digit to thumb with extra time and able to make full composite fist  UPPER EXTREMITY MMT:     MMT Right eval Right 03/26/23 Right 04/16/23 Left Eval 5/5  Shoulder flexion 3- 3- 3+   Shoulder abduction 3- 3- 3+   Shoulder adduction      Shoulder extension      Shoulder internal rotation 3+ 3+ 4-   Shoulder external rotation 3+ 3+ 3+   Middle trapezius      Lower trapezius      Elbow flexion 4- 4- 4  Elbow extension 4 4 4+   Wrist flexion 4- 4- 4   Wrist extension 3+ 4- 4   Wrist ulnar deviation      Wrist radial deviation      Wrist pronation      Wrist supination      (Blank rows = not tested)  HAND FUNCTION: Grip strength: Right: 4 lbs; Left: 50 lbs, Lateral pinch: Right: 0 lbs, Left: 17 lbs, and 3 point pinch: Right: 0 lbs, Left: 19 lbs Grip strength: Right: 18 lbs, Lateral pinch: Right 10 lbs, 3 point pinch: Right 6 lbs 04/16/23: Grip strength: Right: 22 lbs, lateral pinch: Right 11 lbs, 3 point pinch: Right: 6 lbs   COORDINATION: Finger Nose Finger test: able to touch nose with increased time 9 Hole Peg test: Right: unable (picked up a peg with repeat trials but unable to move the peg from horizontal to vertical position to place into board) sec; Left: 24 sec 03/26/23: 5 min 11 sec 04/16/23: 4 min 54 sec   SENSATION: tingling in the R hand  Light touch: Impaired  Proprioception: WFL  EDEMA: N/A  MUSCLE TONE: RUE: Mild and Hypertonic  COGNITION: Overall cognitive status: Within functional limits for tasks assessed  VISION: Subjective report: Pt reports increased blurred vision following her stroke Baseline vision: Wears glasses all the time  VISION ASSESSMENT: Ocular ROM:  WFL Gaze preference/alignment: gaze left; slight Tracking/Visual pursuits: Able to track stimulus in all quads without difficulty Saccades: WFL Visual Fields: no apparent deficits  PERCEPTION: WFL  PRAXIS: Impaired: Motor planning  OBSERVATIONS: Pt guards RUE with flexed elbow and shoulder IR/hand to abdomen during mobility.  R scapular elevation noting increased tension in R upper trap.  TODAY'S TREATMENT:        Moist heat to R shoulder applied intermittently throughout session for pain management/muscle relaxation during simultaneous completion of stretching and neuro re-ed activities noted below.                                                                                                                        Therapeutic Exercise: Performed passive stretching throughout the RUE, including all planes for R shoulder in prep for engaging the RUE into therapeutic exercises and neuro re-ed activities as noted.  Facilitated pinch strengthening with use of therapy resistant clothespins to target lateral and 3 point pinch of R hand.  Pt practiced  clipping pins onto vertical dowel, requiring intermittent cues for form while reaching to isolate arm from trunk and verbal and visual cues for pinch patterns.  Facilitated R grip strengthening with hand gripper set at moderate resistance with 2 red bands to complete 3 sets 10 reps.  Intermittent tactile cues to engage the R SF into each gripping rep.    Neuro re-ed: Facilitated R FMC/dexterity skills and forward reaching patterns working to pick up and place ball pegs into pegboard.  Pt was able to pick up and reposition pegs within fingertips from a non-skid (towel surface) and  did well to use R LF to help with repositioning peg as needed.  Pt required repeat trials d/t dropped pegs, but was able to try again for successful placement.  Alternated therapeutic exercises and FMC/dexterity activities with WB throughout the hand and passive stretching for  tone reduction throughout the RUE.    PATIENT EDUCATION: Education details: RUE neuro re-ed and strengthening activities Person educated: Patient Education method: explanation, vc/visual cues Education comprehension: verbalized understanding, demonstrated understanding  HOME EXERCISE PROGRAM: Soft blue theraputty exercises  GOALS: Goals reviewed with patient? Yes  SHORT TERM GOALS: Target date: 05/28/23 (6 weeks from recert on 04/16/23)  Pt will be indep to perform HEP for increasing RUE flexibility, strength, and coordination to better engage the RUE into daily tasks. Baseline: Not yet initiated; 03/26/23: using putty and engaging the R arm as able into daily tasks; 04/16/23: pt continues with putty and engaging the RUE into ADLs; will progress when able Goal status: ongoing  LONG TERM GOALS: Target date: 07/09/23 (12 weeks from recert on 04/16/23)  Pt will increase FOTO score to 48 or better to indicate clinically relevant improvement in self perceived use of R arm for daily tasks.  Baseline: Eval: 44; 03/26/23: 48 Goal status: achieved/ongoing  2.  Pt will increase R active shoulder flexion to 100* or better to achieve functional ROM for UB ADLs and reaching for ADL supplies. Baseline: Eval: R 38 degrees (passive 60); pt reaches during ADLs only with the L arm; 12/17/06: R 88* (passive 118); 04/16/23: R 121 degrees (passive 127), pt beginning to engage the RUE into reaching for light ADL supplies Goal status: ongoing  3.  Pt will increase R grip strength to 30 or more lbs to enable use of R hand to hold and carry a grocery bag in the R hand. Baseline: Eval: R grip 4 lbs, L 50 lbs; 03/26/23: R grip 18 lbs (pt still uses L hand to hold and carry light ADL supplies); 04/16/23: R grip 22 lbs Goal status: revised  4.  Pt will report <4/10 pain throughout the RUE to enable improved tolerance with using RUE during daily tasks.  Baseline: Eval: 6-7/10 pain throughout the R side (pt predominantly  uses L hand for ADLs); 03/26/23: 6/10 pain; pt demonstrating less sensitivity to touch and tolerates stretching throughout the RUE without jumping or grimacing; 04/16/23: 5-6/10 pain, consistent with report on 03/26/23 (pt has had some medication adjustments with minimal improvement, and pt is now planning Botox at [facility] on July 10.  Goal status: ongoing  5.  Pt will increase R hand dexterity/FMC skills to manipulate clothing fasteners with bilat hands. Baseline: Eval: pt avoids clothing fasteners or manages with extra time using L hand only (R 9 hole: unable, L 24 sec); 03/26/23: pt uses the R hand as a stabilizer with min vc (9 hole peg test 5 min 11 sec); 04/16/23: pt can now button and zip with bilat hands; inconsistent to tie laces (R 9 hole peg test 4 min 54 sec). Goal status: ongoing  ASSESSMENT: CLINICAL IMPRESSION: Pt seen for 18th visit of 24 approved.  Pt worked with therapy resistant clothespins, successfully clipping and unclipping yellow, red, and green clips from a vertical dowel using a 3 point pinch, and was able to manage all colors, including the blue and black most resistive pins using a lateral pinch.  Pt continues to show improvements with FMC/dexterity skills, noting pt's ability to pick up ball pegs from a towel and reposition them within her  fingertips in prep to place them into pegboard, instead of having OT set up peg in hand as was needed in previous sessions.  Pt did drop pegs ~50% of the time, but was able to correct and place pegs successful with repeat trials.  Pt continues to tolerate WB and passive stretching well throughout the RUE to further normalize tone for neuro re-ed and therapeutic exercises.  Pt will continue to benefit from skilled OT to reduce R shoulder stiffness, improve RUE strength, FMC, GMC, and normalize tone throughout the RUE in order to maximize functional use of the RUE with daily tasks.   PERFORMANCE DEFICITS: in functional skills including ADLs, IADLs,  coordination, dexterity, sensation, tone, ROM, strength, pain, flexibility, Fine motor control, Gross motor control, mobility, balance, body mechanics, decreased knowledge of use of DME, vision, and UE functional use.  IMPAIRMENTS: are limiting patient from ADLs, IADLs, rest and sleep, work, and leisure.   CO-MORBIDITIES: has co-morbidities such as CHF, depression  that affects occupational performance. Patient will benefit from skilled OT to address above impairments and improve overall function.  MODIFICATION OR ASSISTANCE TO COMPLETE EVALUATION: No modification of tasks or assist necessary to complete an evaluation.  OT OCCUPATIONAL PROFILE AND HISTORY: Problem focused assessment: Including review of records relating to presenting problem.  CLINICAL DECISION MAKING: Moderate - several treatment options, min-mod task modification necessary  REHAB POTENTIAL: Good  EVALUATION COMPLEXITY: Moderate    PLAN:  OT FREQUENCY: 1-2x/week (pending any visit limitations with insurance), but would benefit from 2x/week  OT DURATION: 12 weeks  PLANNED INTERVENTIONS: self care/ADL training, therapeutic exercise, therapeutic activity, neuromuscular re-education, manual therapy, passive range of motion, balance training, moist heat, cryotherapy, patient/family education, coping strategies training, and DME and/or AE instructions  RECOMMENDED OTHER SERVICES: OT recommended pt follow up with her eye doctor as she does report some increased blurred vision since her CVA  CONSULTED AND AGREED WITH PLAN OF CARE: Patient  PLAN FOR NEXT SESSION: see above  Danelle Earthly, MS, OTR/L  Otis Dials, OT 05/21/2023, 11:09 AM

## 2023-05-22 DIAGNOSIS — Z7689 Persons encountering health services in other specified circumstances: Secondary | ICD-10-CM | POA: Diagnosis not present

## 2023-05-22 DIAGNOSIS — I619 Nontraumatic intracerebral hemorrhage, unspecified: Secondary | ICD-10-CM | POA: Diagnosis not present

## 2023-05-25 NOTE — Progress Notes (Unsigned)
Established Patient Office Visit  Subjective    Patient ID: Anna Tapia, female    DOB: 1991-05-12  Age: 32 y.o. MRN: [id number]  CC:  No chief complaint on file.   HPI JANNESSA OGDEN presents to follow up on chronic medical conditions.  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  Lipid Panel     Component Value Date/Time   CHOL 206 (H) 11/19/2022 1143   TRIG 63 11/19/2022 1143   HDL 70 11/19/2022 1143   CHOLHDL 2.9 11/19/2022 1143   LDLCALC 121 (H) 11/19/2022 1143    Hx of CHF as a newborn: -Had been following with Cardiology at [facility], last seen in 2018 -Last echo 9/23 EF 58% -Denies chest pain, palpitations, shortness of breath or lower extremity swelling  Asthma:  -Asthma status: better -Current Treatments: Advair restarted at LOV -Satisfied with current treatment?: yes -Dyspnea frequency: improved  -Wheezing frequency: none -Cough frequency: improved  -Nocturnal symptom frequency: none  -Limitation of activity: yes -Current upper respiratory symptoms: no -Triggers: cold air  -Pneumovax: unknown -Influenza: Not up to Date  GERD: -Currently on Pepcid 20 mg PRN  MDD: -Mood status: uncontrolled -Current treatment: Prozac 10 mg, just started on 02/20/23 by Neurology -Satisfied with current treatment?:  difficult to tell yet -Duration of current treatment :  days -Side effects: no Medication compliance: excellent compliance     04/22/2023   10:53 AM 03/25/2023    2:51 PM  02/24/2023    1:04 PM 02/24/2023   12:58 PM 11/19/2022   10:58 AM  Depression screen PHQ 2/9  Decreased Interest 0 2 3 3 3   Down, Depressed, Hopeless 0 2 3 3 2   PHQ - 2 Score 0 4 6 6 5   Altered sleeping 0 2 3  3   Tired, decreased energy 0 2 3  3   Change in appetite 0 0 0  1  Feeling bad or failure about yourself  0 1 3  2   Trouble concentrating 0 2 3  2   Moving slowly or fidgety/restless 0 2 3  1   Suicidal thoughts 0 0 0  1  PHQ-9 Score 0 13 21  18   Difficult doing work/chores Not difficult at all Very difficult Very difficult  Somewhat difficult    Health Maintenance: -Blood work UTD -Pap 3/23 negative but positive for HPV  Outpatient Encounter Medications as of 05/26/2023  Medication Sig   acetaminophen (TYLENOL) 325 MG tablet Take 650 mg by mouth every 6 (six) hours as needed.   albuterol (VENTOLIN HFA) 108 (90 Base) MCG/ACT inhaler Inhale 2 puffs into the lungs every 6 (six) hours as needed for wheezing or shortness of breath.   baclofen (LIORESAL) 10 MG tablet Take 1 tablet (10 mg total) by mouth 2 (two) times daily. (Patient taking differently: Take 15 mg by mouth 1 day or 1 dose. 10 mg in the am and 15 mg at night)   dibucaine (NUPERCAINAL) 1 % OINT Place 1 application  rectally 3 (three) times daily as needed for hemorrhoids.  famotidine (PEPCID) 20 MG tablet Take 1 tablet (20 mg total) by mouth 2 (two) times daily.   FLUoxetine (PROZAC) 10 MG capsule Take 10 mg by mouth daily.   fluticasone-salmeterol (ADVAIR) 100-50 MCG/ACT AEPB Inhale 1 puff into the lungs 2 (two) times daily.   pregabalin (LYRICA) 50 MG capsule Take 50 mg by mouth 2 (two) times daily.   No facility-administered encounter medications on file as of 05/26/2023.    Past Medical History:  Diagnosis Date   Arteriovenous malformation of brain    a. s/p remote coiling.   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   Congenital CHF (congestive heart failure) (HCC)    a.  03/2023 Echo: EF 60-65%, no rwma, nl RV fxn, RVSP 29.82mmHg. Mild MR. Mild Ao sclerosis w/o stenosis. Ao root 38mm.   COVID 2022   Hemorrhagic stroke (HCC) 2023   a. L-sided midbrain, thalamic ICH in setting of PCA/ACA aneurysm s/p embolization.   History of broken collarbone 2020   Hydrocephalus (HCC)    a. s/p VP shunt in childhood.   Sepsis secondary to UTI (HCC) 2021    Past Surgical History:  Procedure Laterality Date   VENTRICULOPERITONEAL SHUNT      Family History  Problem Relation Age of Onset   Seizures Mother    Hypertension Mother    Diabetes Mother    Cancer Maternal Aunt    Cancer Maternal Uncle     Social History   Socioeconomic History   Marital status: Single    Spouse name: Not on file   Number of children: Not on file   Years of education: Not on file   Highest education level: Some college, no degree  Occupational History   Not on file  Tobacco Use   Smoking status: Every Day    Current packs/day: 0.00    Types: E-cigarettes, Cigarettes    Start date: 2007    Last attempt to quit: 2021    Years since quitting: 3.5    Passive exposure: Past   Smokeless tobacco: Never  Vaping Use   Vaping status: Every Day   Substances: Nicotine, Flavoring  Substance and Sexual Activity   Alcohol use: Not Currently   Drug use: Never   Sexual activity: Not Currently    Partners: Male    Birth control/protection: Abstinence  Other Topics Concern   Not on file  Social History Narrative   Not on file   Social Determinants of Health   Financial Resource Strain: High Risk (02/20/2023)   Overall Financial Resource Strain (CARDIA)    Difficulty of Paying Living Expenses: Hard  Food Insecurity: Food Insecurity Present (02/20/2023)   Hunger Vital Sign    Worried About Running Out of Food in the Last Year: Often true    Ran Out of Food in the Last Year: Sometimes true  Transportation Needs: No Transportation Needs (02/20/2023)   PRAPARE - Scientist, research (physical sciences) (Medical): No    Lack of Transportation (Non-Medical): No  Physical Activity: Insufficiently Active (02/20/2023)   Exercise Vital Sign    Days of Exercise per Week: 5 days    Minutes of Exercise per Session: 10 min  Stress: Stress Concern Present (02/20/2023)   [employer] of Occupational Health - Occupational Stress Questionnaire    Feeling of Stress : Very much  Social Connections: Socially Isolated (02/20/2023)   Social Connection and Isolation Panel [NHANES]  Frequency of Communication with Friends and Family: Never    Frequency of Social Gatherings with Friends and Family: Once a week    Attends Religious Services: Never    Database administrator or Organizations: No    Attends Engineer, structural: Not on file    Marital Status: Never married  Intimate Partner Violence: Not At Risk (03/25/2023)   Humiliation, Afraid, Rape, and Kick questionnaire    Fear of Current or Ex-Partner: No    Emotionally Abused: No    Physically Abused: No    Sexually Abused: No    Review of Systems  Constitutional:  Negative for chills and fever.  Eyes:  Negative for blurred vision.  Respiratory:  Negative for cough, shortness of breath and wheezing.   Cardiovascular:  Negative for chest pain, palpitations and leg swelling.  Neurological:  Positive for sensory change and focal weakness.        Objective    There were no vitals taken for this visit.  Physical Exam Constitutional:      Appearance: Normal appearance.  HENT:     Head: Normocephalic and atraumatic.  Eyes:     Conjunctiva/sclera: Conjunctivae normal.  Cardiovascular:     Rate and Rhythm: Normal rate and regular rhythm.  Pulmonary:     Effort: Pulmonary effort is normal.     Breath sounds: Normal breath sounds.  Skin:    General: Skin is warm and dry.  Neurological:     Mental Status: She is alert. Mental status is at baseline.  Psychiatric:        Mood and Affect: Mood normal. Affect is  blunt.        Behavior: Behavior normal.         Assessment & Plan:   1. History of CVA with residual deficit: Stable, following with Neurology, note reviewed from 02/20/23. Currently on Lyrica, will refill Baclofen 10 mg BID today.   - baclofen (LIORESAL) 10 MG tablet; Take 1 tablet (10 mg total) by mouth 2 (two) times daily.  Dispense: 60 tablet; Refill: 3  2. Intermittent asthma without complication, unspecified asthma severity: Stable, refill Advair and Albuterol PRN.  - fluticasone-salmeterol (ADVAIR) 100-50 MCG/ACT AEPB; Inhale 1 puff into the lungs 2 (two) times daily.  Dispense: 60 each; Refill: 3 - albuterol (VENTOLIN HFA) 108 (90 Base) MCG/ACT inhaler; Inhale 2 puffs into the lungs every 6 (six) hours as needed for wheezing or shortness of breath.  Dispense: 8 g; Refill: 0  3. Episode of recurrent major depressive disorder, unspecified depression episode severity: Just started on Prozac 10 mg, will follow up in 3 month to recheck.    No follow-ups on file.   Margarita Mail, DO

## 2023-05-26 ENCOUNTER — Ambulatory Visit (INDEPENDENT_AMBULATORY_CARE_PROVIDER_SITE_OTHER): Payer: Medicaid Other | Admitting: Internal Medicine

## 2023-05-26 ENCOUNTER — Encounter: Payer: Self-pay | Admitting: Internal Medicine

## 2023-05-26 VITALS — BP 110/62 | HR 91 | Temp 98.3°F | Resp 18 | Ht 60.0 in | Wt 162.1 lb

## 2023-05-26 DIAGNOSIS — Z7689 Persons encountering health services in other specified circumstances: Secondary | ICD-10-CM | POA: Diagnosis not present

## 2023-05-26 DIAGNOSIS — F339 Major depressive disorder, recurrent, unspecified: Secondary | ICD-10-CM | POA: Diagnosis not present

## 2023-05-26 DIAGNOSIS — I6930 Unspecified sequelae of cerebral infarction: Secondary | ICD-10-CM | POA: Diagnosis not present

## 2023-05-26 DIAGNOSIS — J4520 Mild intermittent asthma, uncomplicated: Secondary | ICD-10-CM | POA: Diagnosis not present

## 2023-05-26 DIAGNOSIS — H6123 Impacted cerumen, bilateral: Secondary | ICD-10-CM

## 2023-05-26 MED ORDER — BACLOFEN 10 MG PO TABS
ORAL_TABLET | ORAL | 1 refills | Status: DC
Start: 2023-05-26 — End: 2023-07-10

## 2023-05-28 ENCOUNTER — Ambulatory Visit: Payer: Medicaid Other

## 2023-05-28 DIAGNOSIS — R278 Other lack of coordination: Secondary | ICD-10-CM | POA: Diagnosis not present

## 2023-05-28 DIAGNOSIS — M6281 Muscle weakness (generalized): Secondary | ICD-10-CM | POA: Diagnosis not present

## 2023-05-28 DIAGNOSIS — Z7689 Persons encountering health services in other specified circumstances: Secondary | ICD-10-CM | POA: Diagnosis not present

## 2023-05-28 NOTE — Therapy (Signed)
OUTPATIENT OCCUPATIONAL THERAPY NEURO TREATMENT NOTE  Patient Name: Anna Tapia MRN: [id number] DOB:November 01, 1991, 32 y.o., female Today's Date: 05/28/2023  PCP: Dr. Margarita Mail REFERRING PROVIDER: Dr. Margarita Mail   OT End of Session - 05/28/23 1026     Visit Number 19    Number of Visits 24    Date for OT Re-Evaluation 07/09/23    Authorization Type Visit 19 of 24 as of 05/28/23    Authorization - Visit Number 19    Authorization - Number of Visits 24    Progress Note Due on Visit 10    OT Start Time 1015    OT Stop Time 1100    OT Time Calculation (min) 45 min    Activity Tolerance Patient tolerated treatment well    Behavior During Therapy WFL for tasks assessed/performed            Past Medical History:  Diagnosis Date   Arteriovenous malformation of brain    a. s/p remote coiling.   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   Congenital CHF (congestive heart failure) (HCC)    a. 03/2023 Echo: EF 60-65%, no rwma, nl RV fxn, RVSP 29.51mmHg. Mild MR. Mild Ao sclerosis w/o stenosis. Ao root 38mm.   COVID 2022   Hemorrhagic stroke (HCC) 2023   a. L-sided midbrain, thalamic ICH in setting of PCA/ACA aneurysm s/p embolization.   History of broken collarbone 2020   Hydrocephalus (HCC)    a. s/p VP shunt in childhood.   Sepsis secondary to UTI (HCC) 2021   Past Surgical History:  Procedure Laterality Date   VENTRICULOPERITONEAL SHUNT     Patient Active Problem List   Diagnosis Date Noted   CHF (congestive heart failure) (HCC) 03/25/2023   Left-sided nontraumatic intracerebral hemorrhage (HCC) 08/01/2022   AVM (arteriovenous malformation) brain 07/24/2022   Asthma 07/24/2022   Major depressive disorder, recurrent episode, moderate (HCC) 02/07/2022   Sepsis secondary to UTI (HCC) 05/01/2020   VP (ventriculoperitoneal) shunt status 05/01/2020   Right ovarian cyst 05/01/2020   Elevated LFTs 05/01/2020   ONSET DATE:  Sept 2023;   REFERRING DIAG:  I63.9 (ICD-10-CM) - CVA (cerebral vascular accident) (HCC)   THERAPY DIAG:  Muscle weakness (generalized)  Other lack of coordination  Rationale for Evaluation and Treatment: Rehabilitation  SUBJECTIVE:  SUBJECTIVE STATEMENT: Pt reports she saw her PCP on Monday and will follow back up in 6 weeks since pt had a medication change with her depression medication.   Pt accompanied by: self   PERTINENT HISTORY: Per Dr. Caralee Ates on 11/19/22: HPI [name] presents to establish care.  She is here with her mom.   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       PRECAUTIONS: None  WEIGHT BEARING RESTRICTIONS: No  PAIN: 05/28/23: 5-6/10 R shoulder  Are you having pain? Yes: NPRS scale: 4-5/10 Pain location: Entire R side, arm and leg Pain description: pins and needles in the hand, arm is achy, moving the arm causes  stabbing pain Aggravating factors: movement of the arm Relieving factors: rest; pt reports medication is minimally effective  FALLS: Has patient fallen in last 6 months? Yes. 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  Being independent."  OBJECTIVE:   HAND DOMINANCE: Left  ADLs: Overall ADLs: mostly performed with the LUE Transfers/ambulation related to ADLs: indep/extra time Eating: cuts food 1 handed Grooming: 1 handed (L) UB Dressing: occasional help to don clothes if tired after a shower; help with straightening clothes; struggles with clothing fasteners LB Dressing: wearing slip on shoes; difficulty with clothing fasteners  Toileting: indep Bathing: modified indep; pt makes attempts to engage the R arm while bathing but reports this is difficult Tub Shower transfers: modified indep Equipment:  see above  IADLs: Shopping: pt can go shopping accompanied by a family member Light housekeeping: 1 handed Meal Prep: cooking causes fatigue; mostly L handed Community mobility: uses rollator for community mobility; uses [employer] transportation Medication management: uses weekly pill organizer; Engineer, materials: mother manages (pt states she doesn't really have any bills)  Handwriting:  N/A (pt is L hand dominant)  MOBILITY STATUS:  ambulatory without AD; pt seeing PT for LE strengthening and balance deficits  POSTURE COMMENTS:  rounded shoulders Sitting balance: Moves/returns truncal midpoint >2 inches in all planes  ACTIVITY TOLERANCE: Activity tolerance: Pain in the R side significantly limits activity tolerance.  Pt reports she can get fatigued with ADLs and IADLs.  FUNCTIONAL OUTCOME MEASURES: FOTO: 44; 48 03/26/23: 48  UPPER EXTREMITY ROM:    Active ROM Right eval Right 03/26/23 Right 04/16/23 Left Eval WNL  Shoulder flexion 38 (60) 88 (118) 121 (127)   Shoulder abduction      Shoulder adduction 42 (51) 85 (105) 98 (110)   Shoulder extension      Shoulder internal rotation  R thumb to posterior hip R thumb to lumbar spine   Shoulder external rotation Hand to back of head (scaption and chin tuck) Hand to back of head (minimal  scaption and minimal chin tuck) Hand to back of head without a chin tuck)   Elbow flexion WNL     Elbow extension WNL     Wrist flexion WNL     Wrist extension WNL     Wrist ulnar deviation      Wrist radial deviation      Wrist pronation WNL     Wrist supination WNL     (Blank rows = not tested)  R hand: Able to oppose each digit to thumb with extra time and able to make full composite fist  UPPER EXTREMITY MMT:     MMT Right eval Right 03/26/23 Right 04/16/23 Left Eval 5/5  Shoulder flexion 3- 3- 3+   Shoulder abduction 3- 3- 3+   Shoulder adduction      Shoulder extension      Shoulder internal rotation 3+ 3+ 4-   Shoulder external rotation 3+ 3+ 3+   Middle trapezius      Lower trapezius  Elbow flexion 4- 4- 4   Elbow extension 4 4 4+   Wrist flexion 4- 4- 4   Wrist extension 3+ 4- 4   Wrist ulnar deviation      Wrist radial deviation      Wrist pronation      Wrist supination      (Blank rows = not tested)  HAND FUNCTION: Grip strength: Right: 4 lbs; Left: 50 lbs, Lateral pinch: Right: 0 lbs, Left: 17 lbs, and 3 point pinch: Right: 0 lbs, Left: 19 lbs Grip strength: Right: 18 lbs, Lateral pinch: Right 10 lbs, 3 point pinch: Right 6 lbs 04/16/23: Grip strength: Right: 22 lbs, lateral pinch: Right 11 lbs, 3 point pinch: Right: 6 lbs   COORDINATION: Finger Nose Finger test: able to touch nose with increased time 9 Hole Peg test: Right: unable (picked up a peg with repeat trials but unable to move the peg from horizontal to vertical position to place into board) sec; Left: 24 sec 03/26/23: 5 min 11 sec 04/16/23: 4 min 54 sec   SENSATION: tingling in the R hand  Light touch: Impaired  Proprioception: WFL  EDEMA: N/A  MUSCLE TONE: RUE: Mild and Hypertonic  COGNITION: Overall cognitive status: Within functional limits for tasks assessed  VISION: Subjective report: Pt reports increased blurred vision following her stroke Baseline vision: Wears glasses all  the time  VISION ASSESSMENT: Ocular ROM: WFL Gaze preference/alignment: gaze left; slight Tracking/Visual pursuits: Able to track stimulus in all quads without difficulty Saccades: WFL Visual Fields: no apparent deficits  PERCEPTION: WFL  PRAXIS: Impaired: Motor planning  OBSERVATIONS: Pt guards RUE with flexed elbow and shoulder IR/hand to abdomen during mobility.  R scapular elevation noting increased tension in R upper trap.  TODAY'S TREATMENT:        Moist heat to R shoulder applied intermittently throughout session for pain management/muscle relaxation during simultaneous completion of stretching and neuro re-ed activities noted below.                                                                                                                        Therapeutic Exercise: Performed passive stretching throughout the RUE, including all planes for R shoulder in prep for engaging the RUE into therapeutic exercises and neuro re-ed activities as noted.  Facilitated hand strengthening with use of hand gripper set at 11.2# to remove jumbo pegs from pegboard x2 trials using R hand.  2 rest breaks needed during each trial d/t fatigue.  Intermittent vc/tactile cues to optimize grip on hand gripper, including making sure to wrap R SF to engage this digit into each gripping rep.   Neuro re-ed: Facilitated R FMC/dexterity skills and forward reaching patterns working to pick up and place jumbo pegs into pegboard, alternating between a raking grasp and a 3 point pinch pattern.  Pt practiced picking up small chips 1 by 1 and storing in hand.  Pt able to store up to 5 in hand before discarding, requiring  intermittent tactile cues to increase pronation and engage the R SF into flexion patterns to reduce dropped chips during storage attempts.  Pt practiced using a lateral pinch to hold 1 chip, them practiced forward and lateral reaching patterns above shoulder level to place chip back into container.   Alternated therapeutic exercises and FMC/dexterity activities with WB throughout the hand, joint compressions through the R wrist, elbow, and shoulder, and passive stretching for tone reduction throughout the RUE.  Instructed pt in WB through the RUE with hand on seat of chair, and hand on table top in standing, using L hand over R with gentle rocking into these positions.  PATIENT EDUCATION: Education details: WB through the RUE to reduce spasticity Person educated: Patient Education method: explanation, tactile cues, demo Education comprehension: verbalized understanding, demonstrated understanding with vc and tactile cues  HOME EXERCISE PROGRAM: Soft blue theraputty exercises  GOALS: Goals reviewed with patient? Yes  SHORT TERM GOALS: Target date: 05/28/23 (6 weeks from recert on 04/16/23)  Pt will be indep to perform HEP for increasing RUE flexibility, strength, and coordination to better engage the RUE into daily tasks. Baseline: Not yet initiated; 03/26/23: using putty and engaging the R arm as able into daily tasks; 04/16/23: pt continues with putty and engaging the RUE into ADLs; will progress when able Goal status: ongoing  LONG TERM GOALS: Target date: 07/09/23 (12 weeks from recert on 04/16/23)  Pt will increase FOTO score to 48 or better to indicate clinically relevant improvement in self perceived use of R arm for daily tasks.  Baseline: Eval: 44; 03/26/23: 48 Goal status: achieved/ongoing  2.  Pt will increase R active shoulder flexion to 100* or better to achieve functional ROM for UB ADLs and reaching for ADL supplies. Baseline: Eval: R 38 degrees (passive 60); pt reaches during ADLs only with the L arm; 1/61/09: R 88* (passive 118); 04/16/23: R 121 degrees (passive 127), pt beginning to engage the RUE into reaching for light ADL supplies Goal status: ongoing  3.  Pt will increase R grip strength to 30 or more lbs to enable use of R hand to hold and carry a grocery bag in the R  hand. Baseline: Eval: R grip 4 lbs, L 50 lbs; 03/26/23: R grip 18 lbs (pt still uses L hand to hold and carry light ADL supplies); 04/16/23: R grip 22 lbs Goal status: revised  4.  Pt will report <4/10 pain throughout the RUE to enable improved tolerance with using RUE during daily tasks.  Baseline: Eval: 6-7/10 pain throughout the R side (pt predominantly uses L hand for ADLs); 03/26/23: 6/10 pain; pt demonstrating less sensitivity to touch and tolerates stretching throughout the RUE without jumping or grimacing; 04/16/23: 5-6/10 pain, consistent with report on 03/26/23 (pt has had some medication adjustments with minimal improvement, and pt is now planning B[facility] at [facility] on July 10.  Goal status: ongoing  5.  Pt will increase R hand dexterity/FMC skills to manipulate clothing fasteners with bilat hands. Baseline: Eval: pt avoids clothing fasteners or manages with extra time using L hand only (R 9 hole: unable, L 24 sec); 03/26/23: pt uses the R hand as a stabilizer with min vc (9 hole peg test 5 min 11 sec); 04/16/23: pt can now button and zip with bilat hands; inconsistent to tie laces (R 9 hole peg test 4 min 54 sec). Goal status: ongoing  ASSESSMENT: CLINICAL IMPRESSION: Pt seen for 19th visit of 24 approved.  Pt continues to improve  with use of a 3 point and lateral pinch to grasp smaller items in the R hand.  Pt reported 5-6/10 pain in the R shoulder upon start of session, but improved with moist heat, passive stretching in all planes, and above shoulder level reaching activities during session.  Pt demonstrated good ability to WB through the hand on table top and in chair with initial demo, vc, and tactile cues from OT.  OT encouraged pt to perform these WB positions prior to ambulation to help with reducing spasticity to allow R arm to hang and swing more during ambulation.  Pt verbalized understanding.  R grip strength continues to improve.  Able to tolerate 11.2# on the hand gripper to remove jumbo  pegs from pegboard for 2 trials, though pt does require 2 rest breaks to complete each trial d/t hand fatigue.  Pt will continue to benefit from skilled OT to reduce R shoulder stiffness, improve RUE strength, FMC, GMC, and normalize tone throughout the RUE in order to maximize functional use of the RUE with daily tasks.   PERFORMANCE DEFICITS: in functional skills including ADLs, IADLs, coordination, dexterity, sensation, tone, ROM, strength, pain, flexibility, Fine motor control, Gross motor control, mobility, balance, body mechanics, decreased knowledge of use of DME, vision, and UE functional use.  IMPAIRMENTS: are limiting patient from ADLs, IADLs, rest and sleep, work, and leisure.   CO-MORBIDITIES: has co-morbidities such as CHF, depression  that affects occupational performance. Patient will benefit from skilled OT to address above impairments and improve overall function.  MODIFICATION OR ASSISTANCE TO COMPLETE EVALUATION: No modification of tasks or assist necessary to complete an evaluation.  OT OCCUPATIONAL PROFILE AND HISTORY: Problem focused assessment: Including review of records relating to presenting problem.  CLINICAL DECISION MAKING: Moderate - several treatment options, min-mod task modification necessary  REHAB POTENTIAL: Good  EVALUATION COMPLEXITY: Moderate    PLAN:  OT FREQUENCY: 1-2x/week (pending any visit limitations with insurance), but would benefit from 2x/week  OT DURATION: 12 weeks  PLANNED INTERVENTIONS: self care/ADL training, therapeutic exercise, therapeutic activity, neuromuscular re-education, manual therapy, passive range of motion, balance training, moist heat, cryotherapy, patient/family education, coping strategies training, and DME and/or AE instructions  RECOMMENDED OTHER SERVICES: OT recommended pt follow up with her eye doctor as she does report some increased blurred vision since her CVA  CONSULTED AND AGREED WITH PLAN OF CARE:  Patient  PLAN FOR NEXT SESSION: see above  Danelle Earthly, MS, OTR/L  [employer] Dials, OT 05/28/2023, 11:06 AM

## 2023-05-30 ENCOUNTER — Ambulatory Visit: Payer: Medicaid Other

## 2023-05-30 DIAGNOSIS — R278 Other lack of coordination: Secondary | ICD-10-CM

## 2023-05-30 DIAGNOSIS — M6281 Muscle weakness (generalized): Secondary | ICD-10-CM | POA: Diagnosis not present

## 2023-05-30 DIAGNOSIS — Z7689 Persons encountering health services in other specified circumstances: Secondary | ICD-10-CM | POA: Diagnosis not present

## 2023-05-30 NOTE — Therapy (Signed)
OUTPATIENT OCCUPATIONAL THERAPY NEURO PROGRESS AND TREATMENT NOTE Reporting period beginning 03/26/23-05/30/23  Patient Name: Anna Tapia MRN: [id number] DOB:10/07/1991, 32 y.o., female Today's Date: 05/30/2023  PCP: Dr. Margarita Tapia REFERRING PROVIDER: Dr. Margarita Tapia   OT End of Session - 05/30/23 1019     Visit Number 20    Number of Visits 24    Date for OT Re-Evaluation 07/09/23    Authorization Type Visit 20 of 24 as of 05/30/23    Authorization - Visit Number 20    Authorization - Number of Visits 24    Progress Note Due on Visit 10    OT Start Time 1015    OT Stop Time 1100    OT Time Calculation (min) 45 min    Activity Tolerance Patient tolerated treatment well    Behavior During Therapy WFL for tasks assessed/performed            Past Medical History:  Diagnosis Date   Arteriovenous malformation of brain    a. s/p remote coiling.   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   Congenital CHF (congestive heart failure) (HCC)    a. 03/2023 Echo: EF 60-65%, no rwma, nl RV fxn, RVSP 29.36mmHg. Mild MR. Mild Ao sclerosis w/o stenosis. Ao root 38mm.   COVID 2022   Hemorrhagic stroke (HCC) 2023   a. L-sided midbrain, thalamic ICH in setting of PCA/ACA aneurysm s/p embolization.   History of broken collarbone 2020   Hydrocephalus (HCC)    a. s/p VP shunt in childhood.   Sepsis secondary to UTI (HCC) 2021   Past Surgical History:  Procedure Laterality Date   VENTRICULOPERITONEAL SHUNT     Patient Active Problem List   Diagnosis Date Noted   CHF (congestive heart failure) (HCC) 03/25/2023   Left-sided nontraumatic intracerebral hemorrhage (HCC) 08/01/2022   AVM (arteriovenous malformation) brain 07/24/2022   Asthma 07/24/2022   Major depressive disorder, recurrent episode, moderate (HCC) 02/07/2022   Sepsis secondary to UTI (HCC) 05/01/2020   VP (ventriculoperitoneal) shunt status 05/01/2020   Right ovarian  cyst 05/01/2020   Elevated LFTs 05/01/2020   ONSET DATE: Sept 2023;   REFERRING DIAG:  I63.9 (ICD-10-CM) - CVA (cerebral vascular accident) (HCC)   THERAPY DIAG:  Muscle weakness (generalized)  Other lack of coordination  Rationale for Evaluation and Treatment: Rehabilitation  SUBJECTIVE:  SUBJECTIVE STATEMENT: Pt reports she will have her first Botox treatments next Wednesday. Pt accompanied by: self   PERTINENT HISTORY: Per Dr. Caralee Tapia on 11/19/22: HPI Anna Tapia presents to establish care.  She is here with her mom.   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       PRECAUTIONS: None  WEIGHT BEARING RESTRICTIONS: No  PAIN: 05/30/23: 6-7/10 R forearm (tightness), 2-3/10 by end of session Are you having pain? Yes: NPRS scale: 4-5/10 Pain location: Entire R side, arm and leg Pain description: pins and needles in the hand, arm is achy, moving the arm causes stabbing pain Aggravating factors: movement of  the arm Relieving factors: rest; pt reports medication is minimally effective  FALLS: Has patient fallen in last 6 months? Yes. 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  Being independent."  OBJECTIVE:   HAND DOMINANCE: Left  ADLs: Overall ADLs: mostly performed with the LUE Transfers/ambulation related to ADLs: indep/extra time Eating: cuts food 1 handed Grooming: 1 handed (L) UB Dressing: occasional help to don clothes if tired after a shower; help with straightening clothes; struggles with clothing fasteners LB Dressing: wearing slip on shoes; difficulty with clothing fasteners  Toileting: indep Bathing: modified indep; pt makes attempts to engage the R arm while bathing but reports this is difficult Tub Shower transfers: modified indep Equipment:  see above  IADLs: Shopping: pt can go shopping accompanied by a family member Light housekeeping: 1 handed Meal Prep: cooking causes fatigue; mostly L handed Community mobility: uses rollator for community mobility; uses [employer] transportation Medication management: uses weekly pill organizer; Engineer, materials: mother manages (pt states she doesn't really have any bills)  Handwriting:  N/A (pt is L hand dominant)  MOBILITY STATUS:  ambulatory without AD; pt seeing PT for LE strengthening and balance deficits  POSTURE COMMENTS:  rounded shoulders Sitting balance: Moves/returns truncal midpoint >2 inches in all planes  ACTIVITY TOLERANCE: Activity tolerance: Pain in the R side significantly limits activity tolerance.  Pt reports she can get fatigued with ADLs and IADLs.  FUNCTIONAL OUTCOME MEASURES: FOTO: 44; 48 03/26/23: 48 05/30/23: 52  UPPER EXTREMITY ROM:    Active ROM Right eval Right 03/26/23 Right 04/16/23 Right 05/30/23 Left Eval WNL  Shoulder flexion 38 (60) 88 (118) 121 (127) 125 (140)   Shoulder abduction       Shoulder adduction 42 (51) 85 (105) 98 (110) 100 (120)   Shoulder extension       Shoulder internal rotation  R thumb to posterior hip R thumb to lumbar spine R thumb to lumbar spine   Shoulder  external rotation Hand to back of head (scaption and chin tuck) Hand to back of head (minimal scaption and minimal chin tuck) Hand to back of head without a chin tuck Hand to back of head without chin tuck, slight scaption   Elbow flexion WNL      Elbow extension WNL      Wrist flexion WNL      Wrist extension WNL      Wrist ulnar deviation       Wrist radial deviation       Wrist pronation WNL      Wrist supination WNL      (Blank rows = not tested)  R hand: Able to oppose each digit to thumb with extra time and able to make full composite fist  UPPER EXTREMITY MMT:     MMT Right eval Right 03/26/23 Right 04/16/23 Right 05/30/23 Left Eval 5/5  Shoulder flexion 3- 3- 3+ 4-   Shoulder abduction 3- 3- 3+ 4-   Shoulder adduction       Shoulder extension  Shoulder internal rotation 3+ 3+ 4- 4-   Shoulder external rotation 3+ 3+ 3+ 4-   Middle trapezius       Lower trapezius       Elbow flexion 4- 4- 4 4+   Elbow extension 4 4 4+ 4+   Wrist flexion 4- 4- 4 4+   Wrist extension 3+ 4- 4 4+   Wrist ulnar deviation       Wrist radial deviation       Wrist pronation       Wrist supination       (Blank rows = not tested)  HAND FUNCTION: Grip strength: Right: 4 lbs; Left: 50 lbs, Lateral pinch: Right: 0 lbs, Left: 17 lbs, and 3 point pinch: Right: 0 lbs, Left: 19 lbs Grip strength: Right: 18 lbs, Lateral pinch: Right 10 lbs, 3 point pinch: Right 6 lbs 04/16/23: Grip strength: Right: 22 lbs, lateral pinch: Right 11 lbs, 3 point pinch: Right: 6 lbs  05/30/23: Grip strength: Right: 20 lbs, lateral pinch: Right 9 lbs, 3 point pinch: Right: 6 lbs   COORDINATION: Finger Nose Finger test: able to touch nose with increased time 9 Hole Peg test: Right: unable (picked up a peg with repeat trials but unable to move the peg from horizontal to vertical position to place into board) sec; Left: 24 sec 03/26/23: 5 min 11 sec 04/16/23: 4 min 54 sec  05/30/23: 3 min 6 sec   SENSATION: tingling in  the R hand  Light touch: Impaired  Proprioception: WFL  EDEMA: N/A  MUSCLE TONE: RUE: Mild and Hypertonic  COGNITION: Overall cognitive status: Within functional limits for tasks assessed  VISION: Subjective report: Pt reports increased blurred vision following her stroke Baseline vision: Wears glasses all the time  VISION ASSESSMENT: Ocular ROM: WFL Gaze preference/alignment: gaze left; slight Tracking/Visual pursuits: Able to track stimulus in all quads without difficulty Saccades: WFL Visual Fields: no apparent deficits  PERCEPTION: WFL  PRAXIS: Impaired: Motor planning  OBSERVATIONS: Pt guards RUE with flexed elbow and shoulder IR/hand to abdomen during mobility.  R scapular elevation noting increased tension in R upper trap.  TODAY'S TREATMENT:        Moist heat to R shoulder applied intermittently throughout session for pain management/muscle relaxation during simultaneous completion of stretching and neuro re-ed activities noted below.                                                                                                                        Therapeutic Exercise: Objective measures taken and goals updated and reviewed for progress note. Issued blue foam block for gripping and pinching as pt reported she would prefer this over the putty at this time.  PATIENT EDUCATION: Education details: HEP review, review of progress towards goals Person educated: Patient Education method: explanation, vc Education comprehension: verbalized understanding  HOME EXERCISE PROGRAM: Soft blue theraputty exercises, blue foam block, cane stretches, WB through the RUE  GOALS: Goals reviewed with patient? Yes  SHORT TERM GOALS: Target date: 05/28/23 (6 weeks from recert on 04/16/23)  Pt will be indep to perform HEP for increasing RUE flexibility, strength, and coordination to better engage the RUE into daily tasks. Baseline: Not yet initiated; 03/26/23: using putty and engaging  the R arm as able into daily tasks; 04/16/23: pt continues with putty and engaging the RUE into ADLs; will progress when able; 05/30/23: Pt doing cane stretches, WB through the RUE, occasionally using putty; issued foam block per pt preference Goal status: ongoing  LONG TERM GOALS: Target date: 07/09/23 (12 weeks from recert on 04/16/23)  Pt will increase FOTO score to 48 or better to indicate clinically relevant improvement in self perceived use of R arm for daily tasks.  Baseline: Eval: 44; 03/26/23: 48; 05/30/23: 52 Goal status: achieved/ongoing  2.  Pt will increase R active shoulder flexion to 140* (revised on 05/30/23) or better to achieve functional ROM for UB ADLs and reaching for ADL supplies. Baseline: Eval: R 38 degrees (passive 60); pt reaches during ADLs only with the L arm; 5/40/98: R 88* (passive 118); 04/16/23: R 121 degrees (passive 127), pt beginning to engage the RUE into reaching for light ADL supplies; 05/30/23: R 125 (passive 140) Goal status: ongoing  3.  Pt will increase R grip strength to 30 or more lbs to enable use of R hand to hold and carry a grocery bag in the R hand. Baseline: Eval: R grip 4 lbs, L 50 lbs; 03/26/23: R grip 18 lbs (pt still uses L hand to hold and carry light ADL supplies); 04/16/23: R grip 22 lbs; 05/30/23: R grip 20 lbs; increased pain today in R forearm and pt also reports she has gotten away from using her putty as consistently as she had been.  Encouraged pt re-focus on foam block for squeezing and pinching for continued progression of hand strength Goal status: revised  4.  Pt will report <4/10 pain throughout the RUE to enable improved tolerance with using RUE during daily tasks.  Baseline: Eval: 6-7/10 pain throughout the R side (pt predominantly uses L hand for ADLs); 03/26/23: 6/10 pain; pt demonstrating less sensitivity to touch and tolerates stretching throughout the RUE without jumping or grimacing; 04/16/23: 5-6/10 pain, consistent with report on  03/26/23 (pt has had some medication adjustments with minimal improvement, and pt is now planning Botox at [facility] on July 10); 05/30/23: 6-7/10 pain beginning of session in R forearm, 2-3/10 by end of session following heat, stretch, WB/muscle massage.  First Botox appt scheduled 06/04/23 next week. Goal status: ongoing  5.  Pt will increase R hand dexterity/FMC skills to manipulate clothing fasteners with bilat hands. Baseline: Eval: pt avoids clothing fasteners or manages with extra time using L hand only (R 9 hole: unable, L 24 sec); 03/26/23: pt uses the R hand as a stabilizer with min vc (9 hole peg test 5 min 11 sec); 04/16/23: pt can now button and zip with bilat hands; inconsistent to tie laces (R 9 hole peg test 4 min 54 sec); 05/30/23: Extra time for clothing fasteners (9 hole 3 min 6 sec) Goal status: ongoing  ASSESSMENT: CLINICAL IMPRESSION: Pt seen for 20th visit progress update; 24 visits approved before new auth required.  FOTO score has improved from 44 at eval to 52 at progress update.  RUE strength continues to improve at the R shoulder, elbow, and wrist.  No significant changes in the R hand strength this period, though focus has been on [facility] and  [facility] skills, and pt also reports increased pain in the R forearm today and has been inconsistent to use her putty at home.  Pt reports the putty can be frustrating to manipulate so foam block was issued today and OT reviewed gripping and pinch patterns to focus on.  Pt continues to have moderate pain levels throughout the RUE, but responds quickly to moist heat, passive stretching, WB, and muscle massage, all of which helps to reduce pain significantly to 0-3/10.  Pt is scheduled for her first round of Botox Wednesday of next week.  Pt will continue to benefit from skilled OT to reduce R shoulder stiffness, improve RUE strength, FMC, GMC, and normalize tone throughout the RUE in order to maximize functional use of the RUE with daily tasks.   PERFORMANCE  DEFICITS: in functional skills including ADLs, IADLs, coordination, dexterity, sensation, tone, ROM, strength, pain, flexibility, Fine motor control, Gross motor control, mobility, balance, body mechanics, decreased knowledge of use of DME, vision, and UE functional use.  IMPAIRMENTS: are limiting patient from ADLs, IADLs, rest and sleep, work, and leisure.   CO-MORBIDITIES: has co-morbidities such as CHF, depression  that affects occupational performance. Patient will benefit from skilled OT to address above impairments and improve overall function.  MODIFICATION OR ASSISTANCE TO COMPLETE EVALUATION: No modification of tasks or assist necessary to complete an evaluation.  OT OCCUPATIONAL PROFILE AND HISTORY: Problem focused assessment: Including review of records relating to presenting problem.  CLINICAL DECISION MAKING: Moderate - several treatment options, min-mod task modification necessary  REHAB POTENTIAL: Good  EVALUATION COMPLEXITY: Moderate    PLAN:  OT FREQUENCY: 1-2x/week (pending any visit limitations with insurance), but would benefit from 2x/week  OT DURATION: 12 weeks  PLANNED INTERVENTIONS: self care/ADL training, therapeutic exercise, therapeutic activity, neuromuscular re-education, manual therapy, passive range of motion, balance training, moist heat, cryotherapy, patient/family education, coping strategies training, and DME and/or AE instructions  RECOMMENDED OTHER SERVICES: OT recommended pt follow up with her eye doctor as she does report some increased blurred vision since her CVA  CONSULTED AND AGREED WITH PLAN OF CARE: Patient  PLAN FOR NEXT SESSION: see above  Danelle Earthly, MS, OTR/L  [employer] Dials, OT 05/30/2023, 11:47 AM

## 2023-06-02 ENCOUNTER — Ambulatory Visit: Payer: Medicaid Other | Admitting: Occupational Therapy

## 2023-06-02 DIAGNOSIS — Z7689 Persons encountering health services in other specified circumstances: Secondary | ICD-10-CM | POA: Diagnosis not present

## 2023-06-02 DIAGNOSIS — R278 Other lack of coordination: Secondary | ICD-10-CM | POA: Diagnosis not present

## 2023-06-02 DIAGNOSIS — M6281 Muscle weakness (generalized): Secondary | ICD-10-CM

## 2023-06-02 NOTE — Therapy (Addendum)
OUTPATIENT OCCUPATIONAL THERAPY NEURO TREATMENT NOTE   Patient Name: Anna Tapia MRN: [id number] DOB:09/23/1991, 32 y.o., female Today's Date: 06/02/2023  PCP: Dr. Margarita Mail REFERRING PROVIDER: Dr. Margarita Mail   OT End of Session - 06/02/23 1104     Visit Number 21    Number of Visits 24    Date for OT Re-Evaluation 07/09/23    Authorization - Number of Visits 24    Progress Note Due on Visit 10    OT Start Time 1030    OT Stop Time 1100    OT Time Calculation (min) 30 min    Activity Tolerance Patient tolerated treatment well    Behavior During Therapy WFL for tasks assessed/performed            Past Medical History:  Diagnosis Date   Arteriovenous malformation of brain    a. s/p remote coiling.   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   Congenital CHF (congestive heart failure) (HCC)    a. 03/2023 Echo: EF 60-65%, no rwma, nl RV fxn, RVSP 29.46mmHg. Mild MR. Mild Ao sclerosis w/o stenosis. Ao root 38mm.   COVID 2022   Hemorrhagic stroke (HCC) 2023   a. L-sided midbrain, thalamic ICH in setting of PCA/ACA aneurysm s/p embolization.   History of broken collarbone 2020   Hydrocephalus (HCC)    a. s/p VP shunt in childhood.   Sepsis secondary to UTI (HCC) 2021   Past Surgical History:  Procedure Laterality Date   VENTRICULOPERITONEAL SHUNT     Patient Active Problem List   Diagnosis Date Noted   CHF (congestive heart failure) (HCC) 03/25/2023   Left-sided nontraumatic intracerebral hemorrhage (HCC) 08/01/2022   AVM (arteriovenous malformation) brain 07/24/2022   Asthma 07/24/2022   Major depressive disorder, recurrent episode, moderate (HCC) 02/07/2022   Sepsis secondary to UTI (HCC) 05/01/2020   VP (ventriculoperitoneal) shunt status 05/01/2020   Right ovarian cyst 05/01/2020   Elevated LFTs 05/01/2020   ONSET DATE: Sept 2023;   REFERRING DIAG:  I63.9 (ICD-10-CM) - CVA (cerebral vascular accident)  (HCC)   THERAPY DIAG:  Muscle weakness (generalized)  Other lack of coordination  Rationale for Evaluation and Treatment: Rehabilitation  SUBJECTIVE:  SUBJECTIVE STATEMENT: Pt reports that she was running late due to traffic and rain today. Pt. Reports she is doing well today.  Pt accompanied by: self   PERTINENT HISTORY: Per Dr. Caralee Ates on 11/19/22: HPI Anna Tapia presents to establish care.  She is here with her mom.   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       PRECAUTIONS: None  WEIGHT BEARING RESTRICTIONS: No  PAIN:  06/02/23: Pt. Reports she is not experiencing any pain today.  05/30/23: 6-7/10 R forearm (tightness), 2-3/10 by end of session Are you having pain? Yes: NPRS scale: 4-5/10 Pain location: Entire R side, arm and leg Pain description: pins and needles in the hand, arm is achy, moving the arm causes stabbing pain Aggravating factors: movement of the arm Relieving  factors: rest; pt reports medication is minimally effective  FALLS: Has patient fallen in last 6 months? Yes. 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  Being independent."  OBJECTIVE:   HAND DOMINANCE: Left  ADLs: Overall ADLs: mostly performed with the LUE Transfers/ambulation related to ADLs: indep/extra time Eating: cuts food 1 handed Grooming: 1 handed (L) UB Dressing: occasional help to don clothes if tired after a shower; help with straightening clothes; struggles with clothing fasteners LB Dressing: wearing slip on shoes; difficulty with clothing fasteners  Toileting: indep Bathing: modified indep; pt makes attempts to engage the R arm while bathing but reports this is difficult Tub Shower transfers: modified indep Equipment:  see above  IADLs: Shopping: pt can go shopping accompanied by a family member Light housekeeping: 1 handed Meal Prep: cooking causes fatigue; mostly L handed Community mobility: uses rollator for community mobility; uses [employer] transportation Medication management: uses weekly pill organizer; Engineer, materials: mother manages (pt states she doesn't really have any bills)  Handwriting:  N/A (pt is L hand dominant)  MOBILITY STATUS:  ambulatory without AD; pt seeing PT for LE strengthening and balance deficits  POSTURE COMMENTS:  rounded shoulders Sitting balance: Moves/returns truncal midpoint >2 inches in all planes  ACTIVITY TOLERANCE: Activity tolerance: Pain in the R side significantly limits activity tolerance.  Pt reports she can get fatigued with ADLs and IADLs.  FUNCTIONAL OUTCOME MEASURES: FOTO: 44; 48 03/26/23: 48 05/30/23: 52  UPPER EXTREMITY ROM:    Active ROM Right eval Right 03/26/23 Right 04/16/23 Right 05/30/23 Left Eval WNL  Shoulder flexion 38 (60) 88 (118) 121 (127) 125 (140)   Shoulder abduction       Shoulder adduction 42 (51) 85 (105) 98 (110) 100 (120)   Shoulder extension       Shoulder internal rotation  R thumb to posterior hip R thumb to lumbar spine R thumb to lumbar spine   Shoulder external rotation Hand to back of head  (scaption and chin tuck) Hand to back of head (minimal scaption and minimal chin tuck) Hand to back of head without a chin tuck Hand to back of head without chin tuck, slight scaption   Elbow flexion WNL      Elbow extension WNL      Wrist flexion WNL      Wrist extension WNL      Wrist ulnar deviation       Wrist radial deviation       Wrist pronation WNL      Wrist supination WNL      (Blank rows = not tested)  R hand: Able to oppose each digit to thumb with extra time and able to make full composite fist  UPPER EXTREMITY MMT:     MMT Right eval Right 03/26/23 Right 04/16/23 Right 05/30/23 Left Eval 5/5  Shoulder flexion 3- 3- 3+ 4-   Shoulder abduction 3- 3- 3+ 4-   Shoulder adduction       Shoulder extension  Shoulder internal rotation 3+ 3+ 4- 4-   Shoulder external rotation 3+ 3+ 3+ 4-   Middle trapezius       Lower trapezius       Elbow flexion 4- 4- 4 4+   Elbow extension 4 4 4+ 4+   Wrist flexion 4- 4- 4 4+   Wrist extension 3+ 4- 4 4+   Wrist ulnar deviation       Wrist radial deviation       Wrist pronation       Wrist supination       (Blank rows = not tested)  HAND FUNCTION: Grip strength: Right: 4 lbs; Left: 50 lbs, Lateral pinch: Right: 0 lbs, Left: 17 lbs, and 3 point pinch: Right: 0 lbs, Left: 19 lbs Grip strength: Right: 18 lbs, Lateral pinch: Right 10 lbs, 3 point pinch: Right 6 lbs 04/16/23: Grip strength: Right: 22 lbs, lateral pinch: Right 11 lbs, 3 point pinch: Right: 6 lbs  05/30/23: Grip strength: Right: 20 lbs, lateral pinch: Right 9 lbs, 3 point pinch: Right: 6 lbs   COORDINATION: Finger Nose Finger test: able to touch nose with increased time 9 Hole Peg test: Right: unable (picked up a peg with repeat trials but unable to move the peg from horizontal to vertical position to place into board) sec; Left: 24 sec 03/26/23: 5 min 11 sec 04/16/23: 4 min 54 sec  05/30/23: 3 min 6 sec   SENSATION: tingling in the R hand  Light touch: Impaired   Proprioception: WFL  EDEMA: N/A  MUSCLE TONE: RUE: Mild and Hypertonic  COGNITION: Overall cognitive status: Within functional limits for tasks assessed  VISION: Subjective report: Pt reports increased blurred vision following her stroke Baseline vision: Wears glasses all the time  VISION ASSESSMENT: Ocular ROM: WFL Gaze preference/alignment: gaze left; slight Tracking/Visual pursuits: Able to track stimulus in all quads without difficulty Saccades: WFL Visual Fields: no apparent deficits  PERCEPTION: WFL  PRAXIS: Impaired: Motor planning  OBSERVATIONS: Pt guards RUE with flexed elbow and shoulder IR/hand to abdomen during mobility.  R scapular elevation noting increased tension in R upper trap.  TODAY'S TREATMENT:        Moist heat to R shoulder applied for pain management/muscle relaxation.                                                                                                                     Therapeutic Exercise:  Performed passive stretching throughout the RUE, including all planes for R shoulder in prep for engaging the RUE into therapeutic exercises.  Facilitated hand strengthening with use of hand gripper set at 11.2# to remove jumbo pegs from pegboard x 1 trials using R hand.  2 rest breaks needed during d/t fatigue.  Intermittent vc/tactile cues to optimize grip on hand gripper, including making sure to wrap R SF to engage this digit into each gripping rep. Pt. Worked on using lateral pinch to place yellow, red, green, and blue resistive clips onto vertical  dowel x 1 trial. Pt. Worked on using three point pinch to place yellow, red, and green resistive clips onto vertical dowel x 1 trial.    PATIENT EDUCATION: Education details: HEP review, review of progress towards goals Person educated: Patient Education method: explanation, vc Education comprehension: verbalized understanding  HOME EXERCISE PROGRAM: Soft blue theraputty exercises, blue foam block,  cane stretches, WB through the RUE  GOALS: Goals reviewed with patient? Yes  SHORT TERM GOALS: Target date: 05/28/23 (6 weeks from recert on 04/16/23)  Pt will be indep to perform HEP for increasing RUE flexibility, strength, and coordination to better engage the RUE into daily tasks. Baseline: Not yet initiated; 03/26/23: using putty and engaging the R arm as able into daily tasks; 04/16/23: pt continues with putty and engaging the RUE into ADLs; will progress when able; 05/30/23: Pt doing cane stretches, WB through the RUE, occasionally using putty; issued foam block per pt preference Goal status: ongoing  LONG TERM GOALS: Target date: 07/09/23 (12 weeks from recert on 04/16/23)  Pt will increase FOTO score to 48 or better to indicate clinically relevant improvement in self perceived use of R arm for daily tasks.  Baseline: Eval: 44; 03/26/23: 48; 05/30/23: 52 Goal status: achieved/ongoing  2.  Pt will increase R active shoulder flexion to 140* (revised on 05/30/23) or better to achieve functional ROM for UB ADLs and reaching for ADL supplies. Baseline: Eval: R 38 degrees (passive 60); pt reaches during ADLs only with the L arm; 4/69/62: R 88* (passive 118); 04/16/23: R 121 degrees (passive 127), pt beginning to engage the RUE into reaching for light ADL supplies; 05/30/23: R 125 (passive 140) Goal status: ongoing  3.  Pt will increase R grip strength to 30 or more lbs to enable use of R hand to hold and carry a grocery bag in the R hand. Baseline: Eval: R grip 4 lbs, L 50 lbs; 03/26/23: R grip 18 lbs (pt still uses L hand to hold and carry light ADL supplies); 04/16/23: R grip 22 lbs; 05/30/23: R grip 20 lbs; increased pain today in R forearm and pt also reports she has gotten away from using her putty as consistently as she had been.  Encouraged pt re-focus on foam block for squeezing and pinching for continued progression of hand strength Goal status: revised  4.  Pt will report <4/10 pain throughout  the RUE to enable improved tolerance with using RUE during daily tasks.  Baseline: Eval: 6-7/10 pain throughout the R side (pt predominantly uses L hand for ADLs); 03/26/23: 6/10 pain; pt demonstrating less sensitivity to touch and tolerates stretching throughout the RUE without jumping or grimacing; 04/16/23: 5-6/10 pain, consistent with report on 03/26/23 (pt has had some medication adjustments with minimal improvement, and pt is now planning Botox at [facility] on July 10); 05/30/23: 6-7/10 pain beginning of session in R forearm, 2-3/10 by end of session following heat, stretch, WB/muscle massage.  First Botox appt scheduled 06/04/23 next week. Goal status: ongoing  5.  Pt will increase R hand dexterity/FMC skills to manipulate clothing fasteners with bilat hands. Baseline: Eval: pt avoids clothing fasteners or manages with extra time using L hand only (R 9 hole: unable, L 24 sec); 03/26/23: pt uses the R hand as a stabilizer with min vc (9 hole peg test 5 min 11 sec); 04/16/23: pt can now button and zip with bilat hands; inconsistent to tie laces (R 9 hole peg test 4 min 54 sec); 05/30/23: Extra time  for clothing fasteners (9 hole 3 min 6 sec) Goal status: ongoing  ASSESSMENT: CLINICAL IMPRESSION: Pt. Tolerated passive stretching throughout the RUE. Pt. Was able to use lateral pinch to place yellow, red, green and blue resistive clips onto horizontal dowel however required increased time and was unable to use lateral pinch to place black resistive clips onto vertical dowel.. Pt. Was able to use three point pinch to place yellow, red, and green resistive clips onto vertical dowel however required increased time and was unable to use three point pinch to place blue and black resistive clips onto vertical dowel. Pt. Was able to grasp 2 jumbo pegs within the palm of her hand before discarding them onto the jumbo peg board when pegs were in a vertical position vs horizontal position in the container. Pt. Was able to  tolerate 11.2# on the hand gripper to remove jumbo pegs from pegboard x 1 trial however Pt. Required frequent rest breaks. Pt will continue to benefit from skilled OT to reduce R shoulder stiffness, improve RUE strength, FMC, GMC, and normalize tone throughout the RUE in order to maximize functional use of the RUE with daily tasks.   PERFORMANCE DEFICITS: in functional skills including ADLs, IADLs, coordination, dexterity, sensation, tone, ROM, strength, pain, flexibility, Fine motor control, Gross motor control, mobility, balance, body mechanics, decreased knowledge of use of DME, vision, and UE functional use.  IMPAIRMENTS: are limiting patient from ADLs, IADLs, rest and sleep, work, and leisure.   CO-MORBIDITIES: has co-morbidities such as CHF, depression  that affects occupational performance. Patient will benefit from skilled OT to address above impairments and improve overall function.  MODIFICATION OR ASSISTANCE TO COMPLETE EVALUATION: No modification of tasks or assist necessary to complete an evaluation.  OT OCCUPATIONAL PROFILE AND HISTORY: Problem focused assessment: Including review of records relating to presenting problem.  CLINICAL DECISION MAKING: Moderate - several treatment options, min-mod task modification necessary  REHAB POTENTIAL: Good  EVALUATION COMPLEXITY: Moderate    PLAN:  OT FREQUENCY: 1-2x/week (pending any visit limitations with insurance), but would benefit from 2x/week  OT DURATION: 12 weeks  PLANNED INTERVENTIONS: self care/ADL training, therapeutic exercise, therapeutic activity, neuromuscular re-education, manual therapy, passive range of motion, balance training, moist heat, cryotherapy, patient/family education, coping strategies training, and DME and/or AE instructions  RECOMMENDED OTHER SERVICES: OT recommended pt follow up with her eye doctor as she does report some increased blurred vision since her CVA  CONSULTED AND AGREED WITH PLAN OF CARE:  Patient  PLAN FOR NEXT SESSION: see above  Herma Carson, Student-OT 06/02/2023, 11:06 AM  This entire session was performed under the direct supervision and direction of a licensed therapist. I have personally read, edited, and approve of the note as written.   Olegario Messier, MS, OTR/L  06/02/2023

## 2023-06-04 DIAGNOSIS — Z7689 Persons encountering health services in other specified circumstances: Secondary | ICD-10-CM | POA: Diagnosis not present

## 2023-06-05 DIAGNOSIS — Z419 Encounter for procedure for purposes other than remedying health state, unspecified: Secondary | ICD-10-CM | POA: Diagnosis not present

## 2023-06-09 ENCOUNTER — Ambulatory Visit: Payer: Medicaid Other | Attending: Internal Medicine | Admitting: Occupational Therapy

## 2023-06-09 ENCOUNTER — Encounter: Payer: Self-pay | Admitting: Occupational Therapy

## 2023-06-09 DIAGNOSIS — R278 Other lack of coordination: Secondary | ICD-10-CM | POA: Insufficient documentation

## 2023-06-09 DIAGNOSIS — M6281 Muscle weakness (generalized): Secondary | ICD-10-CM | POA: Insufficient documentation

## 2023-06-09 DIAGNOSIS — Z7689 Persons encountering health services in other specified circumstances: Secondary | ICD-10-CM | POA: Diagnosis not present

## 2023-06-09 NOTE — Therapy (Signed)
OUTPATIENT OCCUPATIONAL THERAPY NEURO TREATMENT NOTE   Patient Name: Anna Tapia MRN: [id number] DOB:04/10/1991, 32 y.o., female Today's Date: 06/09/2023  PCP: Dr. Margarita Mail REFERRING PROVIDER: Dr. Margarita Mail   OT End of Session - 06/09/23 1406     Visit Number 22    Number of Visits 24    Date for OT Re-Evaluation 07/09/23    Authorization - Number of Visits 24    Progress Note Due on Visit 10    OT Start Time 1400    OT Stop Time 1445    OT Time Calculation (min) 45 min    Activity Tolerance Patient tolerated treatment well    Behavior During Therapy WFL for tasks assessed/performed            Past Medical History:  Diagnosis Date   Arteriovenous malformation of brain    a. s/p remote coiling.   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   Congenital CHF (congestive heart failure) (HCC)    a. 03/2023 Echo: EF 60-65%, no rwma, nl RV fxn, RVSP 29.19mmHg. Mild MR. Mild Ao sclerosis w/o stenosis. Ao root 38mm.   COVID 2022   Hemorrhagic stroke (HCC) 2023   a. L-sided midbrain, thalamic ICH in setting of PCA/ACA aneurysm s/p embolization.   History of broken collarbone 2020   Hydrocephalus (HCC)    a. s/p VP shunt in childhood.   Sepsis secondary to UTI (HCC) 2021   Past Surgical History:  Procedure Laterality Date   VENTRICULOPERITONEAL SHUNT     Patient Active Problem List   Diagnosis Date Noted   CHF (congestive heart failure) (HCC) 03/25/2023   Left-sided nontraumatic intracerebral hemorrhage (HCC) 08/01/2022   AVM (arteriovenous malformation) brain 07/24/2022   Asthma 07/24/2022   Major depressive disorder, recurrent episode, moderate (HCC) 02/07/2022   Sepsis secondary to UTI (HCC) 05/01/2020   VP (ventriculoperitoneal) shunt status 05/01/2020   Right ovarian cyst 05/01/2020   Elevated LFTs 05/01/2020   ONSET DATE: Sept 2023;   REFERRING DIAG:  I63.9 (ICD-10-CM) - CVA (cerebral vascular accident)  (HCC)   THERAPY DIAG:  Muscle weakness (generalized)  Other lack of coordination  Rationale for Evaluation and Treatment: Rehabilitation  SUBJECTIVE:  SUBJECTIVE STATEMENT: Pt was late due to transportation issues today.   Pt accompanied by: self   PERTINENT HISTORY: Per Dr. Caralee Ates on 11/19/22: HPI [name] presents to establish care.  She is here with her mom.   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       PRECAUTIONS: None  WEIGHT BEARING RESTRICTIONS: No  PAIN:   2/10 R forearm (tightness), 2-3/10 by end of session Are you having pain? Yes: NPRS scale: 4-5/10 Pain location: Entire R side, arm and leg Pain description: pins and needles in the hand, arm is achy, moving the arm causes stabbing pain Aggravating factors: movement of the arm Relieving factors: rest; pt reports medication is minimally effective  FALLS: Has patient fallen in last 6 months? Yes. 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  Being independent."  OBJECTIVE:   HAND DOMINANCE: Left  ADLs: Overall ADLs: mostly performed  with the LUE Transfers/ambulation related to ADLs: indep/extra time Eating: cuts food 1 handed Grooming: 1 handed (L) UB Dressing: occasional help to don clothes if tired after a shower; help with straightening clothes; struggles with clothing fasteners LB Dressing: wearing slip on shoes; difficulty with clothing fasteners  Toileting: indep Bathing: modified indep; pt makes attempts to engage the R arm while bathing but reports this is difficult Tub Shower transfers: modified indep Equipment:  see above  IADLs: Shopping: pt can go shopping accompanied by a family member Light housekeeping: 1 handed Meal Prep: cooking causes fatigue; mostly L handed Community mobility: uses rollator for community mobility; uses [employer] transportation Medication management: uses weekly pill organizer; Engineer, materials: mother manages (pt states she doesn't really have any bills)  Handwriting:  N/A (pt is L hand dominant)  MOBILITY STATUS:  ambulatory without AD; pt seeing PT for LE strengthening and balance deficits  POSTURE COMMENTS:  rounded shoulders Sitting balance: Moves/returns truncal midpoint >2 inches in all planes  ACTIVITY TOLERANCE: Activity tolerance: Pain in the R side significantly limits activity tolerance.  Pt reports she can get fatigued with ADLs and IADLs.  FUNCTIONAL OUTCOME MEASURES: FOTO: 44; 48 03/26/23: 48 05/30/23: 52  UPPER EXTREMITY ROM:    Active ROM Right eval Right 03/26/23 Right 04/16/23 Right 05/30/23 Left Eval WNL  Shoulder flexion 38 (60) 88 (118) 121 (127) 125 (140)   Shoulder abduction       Shoulder adduction 42 (51) 85 (105) 98 (110) 100 (120)   Shoulder extension       Shoulder internal rotation  R thumb to posterior hip R thumb to lumbar spine R thumb to lumbar spine   Shoulder external rotation Hand to back of head (scaption and chin tuck) Hand to back of head (minimal scaption and minimal chin tuck) Hand to back of head without a chin tuck  Hand to back of head without chin tuck, slight scaption   Elbow flexion WNL      Elbow extension WNL      Wrist flexion WNL      Wrist extension WNL      Wrist ulnar deviation       Wrist radial deviation       Wrist pronation WNL      Wrist supination WNL      (Blank rows = not tested)  R hand: Able to oppose each digit to thumb with extra time and able to make full composite fist  UPPER EXTREMITY MMT:     MMT Right eval Right 03/26/23 Right 04/16/23 Right 05/30/23 Left Eval 5/5  Shoulder flexion 3- 3- 3+ 4-   Shoulder abduction 3- 3- 3+ 4-   Shoulder adduction       Shoulder extension       Shoulder internal rotation 3+ 3+ 4- 4-   Shoulder external rotation 3+ 3+ 3+ 4-   Middle trapezius  Lower trapezius       Elbow flexion 4- 4- 4 4+   Elbow extension 4 4 4+ 4+   Wrist flexion 4- 4- 4 4+   Wrist extension 3+ 4- 4 4+   Wrist ulnar deviation       Wrist radial deviation       Wrist pronation       Wrist supination       (Blank rows = not tested)  HAND FUNCTION: Grip strength: Right: 4 lbs; Left: 50 lbs, Lateral pinch: Right: 0 lbs, Left: 17 lbs, and 3 point pinch: Right: 0 lbs, Left: 19 lbs Grip strength: Right: 18 lbs, Lateral pinch: Right 10 lbs, 3 point pinch: Right 6 lbs 04/16/23: Grip strength: Right: 22 lbs, lateral pinch: Right 11 lbs, 3 point pinch: Right: 6 lbs  05/30/23: Grip strength: Right: 20 lbs, lateral pinch: Right 9 lbs, 3 point pinch: Right: 6 lbs   COORDINATION: Finger Nose Finger test: able to touch nose with increased time 9 Hole Peg test: Right: unable (picked up a peg with repeat trials but unable to move the peg from horizontal to vertical position to place into board) sec; Left: 24 sec 03/26/23: 5 min 11 sec 04/16/23: 4 min 54 sec  05/30/23: 3 min 6 sec   SENSATION: tingling in the R hand  Light touch: Impaired  Proprioception: WFL  EDEMA: N/A  MUSCLE TONE: RUE: Mild and Hypertonic  COGNITION: Overall cognitive status: Within  functional limits for tasks assessed  VISION: Subjective report: Pt reports increased blurred vision following her stroke Baseline vision: Wears glasses all the time  VISION ASSESSMENT: Ocular ROM: WFL Gaze preference/alignment: gaze left; slight Tracking/Visual pursuits: Able to track stimulus in all quads without difficulty Saccades: WFL Visual Fields: no apparent deficits  PERCEPTION: WFL  PRAXIS: Impaired: Motor planning  OBSERVATIONS: Pt guards RUE with flexed elbow and shoulder IR/hand to abdomen during mobility.  R scapular elevation noting increased tension in R upper trap.  TODAY'S TREATMENT:        Moist heat to R shoulder applied for pain management/muscle relaxation.                                  Neuromuscular Re-Education:  Pt focused on RUE 3pt pinch manipulating 1 inch ball knob on peg. Reports increased difficulty manipulating in hand to place pegs this date. Places and removes 20 pegs with MIN cues to avoid compensatory trunk movements. Required multiple rest breaks and mat to prevent board from sliding 2/2 tremors. 1st trial unable to grasp knobs out of Tupperware, required setup assist to grasp from upright position. 2nd trial removes from Tupperware. Stored 2-3 pegs in hand while removing pegs from board for 2nd trial.                                                                                       Therapeutic Exercise:  Performed passive stretching throughout the RUE, including all planes for R shoulder in prep for engaging the RUE into therapeutic exercises.  Pt focused on using lateral pinch to place  yellow, red, green, and blue resistive clips onto vertical dowel x 2 trials.   PATIENT EDUCATION: Education details: HEP review, review of progress towards goals Person educated: Patient Education method: explanation, vc Education comprehension: verbalized understanding  HOME EXERCISE PROGRAM: Soft blue theraputty exercises, blue foam block, cane  stretches, WB through the RUE  GOALS: Goals reviewed with patient? Yes  SHORT TERM GOALS: Target date: 05/28/23 (6 weeks from recert on 04/16/23)  Pt will be indep to perform HEP for increasing RUE flexibility, strength, and coordination to better engage the RUE into daily tasks. Baseline: Not yet initiated; 03/26/23: using putty and engaging the R arm as able into daily tasks; 04/16/23: pt continues with putty and engaging the RUE into ADLs; will progress when able; 05/30/23: Pt doing cane stretches, WB through the RUE, occasionally using putty; issued foam block per pt preference Goal status: ongoing  LONG TERM GOALS: Target date: 07/09/23 (12 weeks from recert on 04/16/23)  Pt will increase FOTO score to 48 or better to indicate clinically relevant improvement in self perceived use of R arm for daily tasks.  Baseline: Eval: 44; 03/26/23: 48; 05/30/23: 52 Goal status: achieved/ongoing  2.  Pt will increase R active shoulder flexion to 140* (revised on 05/30/23) or better to achieve functional ROM for UB ADLs and reaching for ADL supplies. Baseline: Eval: R 38 degrees (passive 60); pt reaches during ADLs only with the L arm; 1/61/09: R 88* (passive 118); 04/16/23: R 121 degrees (passive 127), pt beginning to engage the RUE into reaching for light ADL supplies; 05/30/23: R 125 (passive 140) Goal status: ongoing  3.  Pt will increase R grip strength to 30 or more lbs to enable use of R hand to hold and carry a grocery bag in the R hand. Baseline: Eval: R grip 4 lbs, L 50 lbs; 03/26/23: R grip 18 lbs (pt still uses L hand to hold and carry light ADL supplies); 04/16/23: R grip 22 lbs; 05/30/23: R grip 20 lbs; increased pain today in R forearm and pt also reports she has gotten away from using her putty as consistently as she had been.  Encouraged pt re-focus on foam block for squeezing and pinching for continued progression of hand strength Goal status: revised  4.  Pt will report <4/10 pain throughout the  RUE to enable improved tolerance with using RUE during daily tasks.  Baseline: Eval: 6-7/10 pain throughout the R side (pt predominantly uses L hand for ADLs); 03/26/23: 6/10 pain; pt demonstrating less sensitivity to touch and tolerates stretching throughout the RUE without jumping or grimacing; 04/16/23: 5-6/10 pain, consistent with report on 03/26/23 (pt has had some medication adjustments with minimal improvement, and pt is now planning Botox at [facility] on July 10); 05/30/23: 6-7/10 pain beginning of session in R forearm, 2-3/10 by end of session following heat, stretch, WB/muscle massage.  First Botox appt scheduled 06/04/23 next week. Goal status: ongoing  5.  Pt will increase R hand dexterity/FMC skills to manipulate clothing fasteners with bilat hands. Baseline: Eval: pt avoids clothing fasteners or manages with extra time using L hand only (R 9 hole: unable, L 24 sec); 03/26/23: pt uses the R hand as a stabilizer with min vc (9 hole peg test 5 min 11 sec); 04/16/23: pt can now button and zip with bilat hands; inconsistent to tie laces (R 9 hole peg test 4 min 54 sec); 05/30/23: Extra time for clothing fasteners (9 hole 3 min 6 sec) Goal status: ongoing  ASSESSMENT: CLINICAL IMPRESSION: Pt. Tolerated passive stretching throughout the RUE. 2 trials place and remove ball pegs from tupperware onto pegboard using 3pt pinch. Intermittent use of L hand to orient peg to hole correctly, assist to stabilize board/tupperware, and weightbearing rest breaks required. Pt used lateral pinch to remove yellow, red, green and blue resistive clips onto vertical dowel x2 trials. Required frequent rest breaks. Pt will continue to benefit from skilled OT to reduce R shoulder stiffness, improve RUE strength, FMC, GMC, and normalize tone throughout the RUE in order to maximize functional use of the RUE with daily tasks.   PERFORMANCE DEFICITS: in functional skills including ADLs, IADLs, coordination, dexterity, sensation, tone,  ROM, strength, pain, flexibility, Fine motor control, Gross motor control, mobility, balance, body mechanics, decreased knowledge of use of DME, vision, and UE functional use.  IMPAIRMENTS: are limiting patient from ADLs, IADLs, rest and sleep, work, and leisure.   CO-MORBIDITIES: has co-morbidities such as CHF, depression  that affects occupational performance. Patient will benefit from skilled OT to address above impairments and improve overall function.  MODIFICATION OR ASSISTANCE TO COMPLETE EVALUATION: No modification of tasks or assist necessary to complete an evaluation.  OT OCCUPATIONAL PROFILE AND HISTORY: Problem focused assessment: Including review of records relating to presenting problem.  CLINICAL DECISION MAKING: Moderate - several treatment options, min-mod task modification necessary  REHAB POTENTIAL: Good  EVALUATION COMPLEXITY: Moderate    PLAN:  OT FREQUENCY: 1-2x/week (pending any visit limitations with insurance), but would benefit from 2x/week  OT DURATION: 12 weeks  PLANNED INTERVENTIONS: self care/ADL training, therapeutic exercise, therapeutic activity, neuromuscular re-education, manual therapy, passive range of motion, balance training, moist heat, cryotherapy, patient/family education, coping strategies training, and DME and/or AE instructions  RECOMMENDED OTHER SERVICES: OT recommended pt follow up with her eye doctor as she does report some increased blurred vision since her CVA  CONSULTED AND AGREED WITH PLAN OF CARE: Patient  PLAN FOR NEXT SESSION: see above  Presley Raddle, OT 06/09/2023, 2:06 PM  Kathie Dike, M.S. OTR/L  06/09/23, 2:06 PM  ascom [phone]

## 2023-06-11 ENCOUNTER — Encounter: Payer: Self-pay | Admitting: Nurse Practitioner

## 2023-06-11 ENCOUNTER — Encounter: Payer: Self-pay | Admitting: Internal Medicine

## 2023-06-11 ENCOUNTER — Ambulatory Visit: Payer: Medicaid Other | Attending: Nurse Practitioner | Admitting: Nurse Practitioner

## 2023-06-11 VITALS — BP 138/80 | HR 85 | Ht 60.0 in | Wt 166.1 lb

## 2023-06-11 DIAGNOSIS — Z7689 Persons encountering health services in other specified circumstances: Secondary | ICD-10-CM | POA: Diagnosis not present

## 2023-06-11 DIAGNOSIS — I509 Heart failure, unspecified: Secondary | ICD-10-CM

## 2023-06-11 DIAGNOSIS — Z79899 Other long term (current) drug therapy: Secondary | ICD-10-CM | POA: Diagnosis not present

## 2023-06-11 DIAGNOSIS — I6930 Unspecified sequelae of cerebral infarction: Secondary | ICD-10-CM | POA: Diagnosis not present

## 2023-06-11 DIAGNOSIS — R072 Precordial pain: Secondary | ICD-10-CM | POA: Diagnosis not present

## 2023-06-11 DIAGNOSIS — I69351 Hemiplegia and hemiparesis following cerebral infarction affecting right dominant side: Secondary | ICD-10-CM | POA: Diagnosis not present

## 2023-06-11 DIAGNOSIS — G8111 Spastic hemiplegia affecting right dominant side: Secondary | ICD-10-CM | POA: Diagnosis not present

## 2023-06-11 NOTE — Progress Notes (Signed)
Office Visit    Patient Name: Anna Tapia Date of Encounter: 06/11/2023  Primary Care Provider:  Margarita Mail, DO Primary Cardiologist:  Debbe Odea, MD  Chief Complaint    32 y.o. female w/ a h/o cerebral AVM s/p coiling, hemorrhagic stroke (left-sided midbrain, thalamic ICH), PCA and ACA aneurysm s/p Onyx embolization 07/2022, VP shunt in childhood, and report of congenital CHF, who presents for f/u of chest pain.   Past Medical History    Past Medical History:  Diagnosis Date   Arteriovenous malformation of brain    a. s/p remote coiling.   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   Congenital CHF (congestive heart failure) (HCC)    a. 03/2023 Echo: EF 60-65%, no rwma, nl RV fxn, RVSP 29.64mmHg. Mild MR. Mild Ao sclerosis w/o stenosis. Ao root 38mm.   COVID 2022   Hemorrhagic stroke (HCC) 2023   a. L-sided midbrain, thalamic ICH in setting of PCA/ACA aneurysm s/p embolization.   History of broken collarbone 2020   Hydrocephalus (HCC)    a. s/p VP shunt in childhood.   Precordial chest pain    a. 03/2023 MV: No ischemia or infarct.  EF greater than 65%.  No significant coronary calcification.   Sepsis secondary to UTI [facility]) 2021   Past Surgical History:  Procedure Laterality Date   VENTRICULOPERITONEAL SHUNT      Allergies  No Known Allergies  History of Present Illness      32 y.o. y/o female w/ a h/o cerebral AVM s/p coiling, hemorrhagic stroke (left-sided midbrain, thalamic ICH), PCA and ACA aneurysm s/p Onyx embolization 07/2022, VP shunt in childhood, and report of congenital CHF.  She was previously seen 4/17 due to dyspnea and underwent an echo, which showed normal LV function with mild MR and borderline dilation of the aortic root at 38 mm.     At cardiology clinic follow-up in May 2024, she reported intermittent chest pain.  She subsequently underwent Lexiscan Myoview, which was nonischemic with normal LV  function.  Since then, she has continued to have intermittent left-sided chest discomfort, typically at rest although sometimes with activity.  Symptoms are fairly short-lived and resolve spontaneously.  She is encouraged by the news of normal stress test.  She denies dyspnea, palpitations, PND, orthopnea, dizziness, syncope, edema, or early satiety.  Home Medications    Current Outpatient Medications  Medication Sig Dispense Refill   acetaminophen (TYLENOL) 325 MG tablet Take 650 mg by mouth every 6 (six) hours as needed.     albuterol (VENTOLIN HFA) 108 (90 Base) MCG/ACT inhaler Inhale 2 puffs into the lungs every 6 (six) hours as needed for wheezing or shortness of breath. 8 g 0   baclofen (LIORESAL) 10 MG tablet Take 5 mg in the morning, 5 mg at lunch and 10 mg at night. 180 tablet 1   citalopram (CELEXA) 10 MG tablet Take 1 tablet (10 mg total) by mouth daily.     dibucaine (NUPERCAINAL) 1 % OINT Place 1 application  rectally 3 (three) times daily as needed for hemorrhoids. 30 g 0   famotidine (PEPCID) 20 MG tablet Take 1 tablet (20 mg total) by mouth 2 (two) times daily. 60 tablet 0   fluticasone-salmeterol (ADVAIR) 100-50 MCG/ACT AEPB Inhale 1 puff into the lungs 2 (two) times daily. 60 each 3   pregabalin (LYRICA) 50 MG capsule Take 50 mg by mouth 2 (two) times daily.  No current facility-administered medications for this visit.     Review of Systems    Intermittent precordial chest pain as outlined above.  She denies dyspnea, palpitations, PND, orthopnea, dizziness, syncope, edema, or early satiety.  All other systems reviewed and are otherwise negative except as noted above.    Physical Exam    VS:  BP 138/80 (BP Location: Left Arm, Patient Position: Sitting, Cuff Size: Normal)   Pulse 85   Ht 5' (1.524 m)   Wt 166 lb 2 oz (75.4 kg)   SpO2 93%   BMI 32.44 kg/m  , BMI Body mass index is 32.44 kg/m.     GEN: Well nourished, well developed, in no acute distress. HEENT:  normal. Neck: Supple, no JVD, carotid bruits, or masses. Cardiac: RRR, no murmurs, rubs, or gallops. No clubbing, cyanosis, edema.  Radials 2+/PT 2+ and equal bilaterally.  Respiratory:  Respirations regular and unlabored, clear to auscultation bilaterally. GI: Soft, nontender, nondistended, BS + x 4. MS: no deformity or atrophy. Skin: warm and dry, no rash. Neuro: Right hemiparesis. Psych: Normal affect.  Accessory Clinical Findings    ECG personally reviewed by me today - EKG Interpretation Date/Time:  Wednesday June 11 2023 15:37:05 EDT Ventricular Rate:  85 PR Interval:  170 QRS Duration:  72 QT Interval:  374 QTC Calculation: 445 R Axis:   9  Text Interpretation: Normal sinus rhythm Normal ECG When compared with ECG of 24-Jul-2022 19:42, PREVIOUS ECG IS PRESENT Confirmed by Nicolasa Ducking [phone]) on 06/11/2023 3:54:04 PM  - no acute changes.  Lab Results  Component Value Date   WBC 6.1 11/19/2022   HGB 13.0 11/19/2022   HCT 39.7 11/19/2022   MCV 87.8 11/19/2022   PLT 415 (H) 11/19/2022   Lab Results  Component Value Date   CREATININE 0.71 11/19/2022   BUN 7 11/19/2022   NA 140 11/19/2022   K 4.5 11/19/2022   CL 105 11/19/2022   CO2 26 11/19/2022   Lab Results  Component Value Date   ALT 13 11/19/2022   AST 17 11/19/2022   ALKPHOS 53 07/24/2022   BILITOT 0.3 11/19/2022   Lab Results  Component Value Date   CHOL 206 (H) 11/19/2022   HDL 70 11/19/2022   LDLCALC 121 (H) 11/19/2022   TRIG 63 11/19/2022   CHOLHDL 2.9 11/19/2022    Assessment & Plan    1.  Precordial chest pain: Recently evaluated with a Lexiscan Myoview, which was nonischemic with normal LV function.  No significant coronary calcium noted.  She continues to have intermittent predominantly resting chest discomfort, though is very reassured by recent study.  No further ischemic evaluation is warranted.  2.  Congenital congestive heart failure/dyspnea exertion: Overall stable.  She indicates  that at 1 point she was on Lanoxin during her childhood, though not in several years.  Normal LV function by recent echo and euvolemic on exam.  3.  History of hemorrhagic stroke: In the setting of AVM and aneurysms.  Chronic right hemiparesis.  Overall stable.  4.  Disposition: Follow-up in 1 year or sooner if necessary.  Informed Consent    Nicolasa Ducking, NP 06/11/2023, 4:14 PM

## 2023-06-11 NOTE — Patient Instructions (Addendum)
Medication Instructions:  Your physician recommends that you continue on your current medications as directed. Please refer to the Current Medication list given to you today.  *If you need a refill on your cardiac medications before your next appointment, please call your pharmacy*   Lab Work:  No lab work ordered today.  If you have labs (blood work) drawn today and your tests are completely normal, you will receive your results only by: MyChart Message (if you have MyChart) OR A paper copy in the mail If you have any lab test that is abnormal or we need to change your treatment, we will call you to review the results.   Testing/Procedures:  No testing ordered today.  Follow-Up: At [facility], you and your health needs are our priority.  As part of our continuing mission to provide you with exceptional heart care, we have created designated Provider Care Teams.  These Care Teams include your primary Cardiologist (physician) and Advanced Practice Providers (APPs -  Physician Assistants and Nurse Practitioners) who all work together to provide you with the care you need, when you need it.  We recommend signing up for the patient portal called "MyChart".  Sign up information is provided on this After Visit Summary.  MyChart is used to connect with patients for Virtual Visits (Telemedicine).  Patients are able to view lab/test results, encounter notes, upcoming appointments, etc.  Non-urgent messages can be sent to your provider as well.   To learn more about what you can do with MyChart, go to [email or web address].    Your next appointment:   12 month(s)  Provider:   You may see Debbe Odea, MD

## 2023-06-12 ENCOUNTER — Ambulatory Visit: Payer: Medicaid Other

## 2023-06-12 DIAGNOSIS — M6281 Muscle weakness (generalized): Secondary | ICD-10-CM | POA: Diagnosis not present

## 2023-06-12 DIAGNOSIS — R278 Other lack of coordination: Secondary | ICD-10-CM

## 2023-06-12 DIAGNOSIS — Z7689 Persons encountering health services in other specified circumstances: Secondary | ICD-10-CM | POA: Diagnosis not present

## 2023-06-12 NOTE — Therapy (Addendum)
OUTPATIENT OCCUPATIONAL THERAPY NEURO TREATMENT NOTE   Patient Name: Anna Tapia MRN: [id number] DOB:Nov 11, 1990, 32 y.o., female Today's Date: 06/12/2023  PCP: Dr. Margarita Mail REFERRING PROVIDER: Dr. Margarita Mail   OT End of Session - 06/12/23 1259     Visit Number 23    Number of Visits 24    Date for OT Re-Evaluation 07/09/23    OT Start Time 1130    OT Stop Time 1225    OT Time Calculation (min) 55 min    Activity Tolerance Patient tolerated treatment well    Behavior During Therapy [facility] for tasks assessed/performed            Past Medical History:  Diagnosis Date   Arteriovenous malformation of brain    a. s/p remote coiling.   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   Congenital CHF (congestive heart failure) (HCC)    a. 03/2023 Echo: EF 60-65%, no rwma, nl RV fxn, RVSP 29.90mmHg. Mild MR. Mild Ao sclerosis w/o stenosis. Ao root 38mm.   COVID 2022   Hemorrhagic stroke (HCC) 2023   a. L-sided midbrain, thalamic ICH in setting of PCA/ACA aneurysm s/p embolization.   History of broken collarbone 2020   Hydrocephalus (HCC)    a. s/p VP shunt in childhood.   Precordial chest pain    a. 03/2023 MV: No ischemia or infarct.  EF greater than 65%.  No significant coronary calcification.   Sepsis secondary to UTI (HCC) 2021   Past Surgical History:  Procedure Laterality Date   VENTRICULOPERITONEAL SHUNT     Patient Active Problem List   Diagnosis Date Noted   CHF (congestive heart failure) (HCC) 03/25/2023   Left-sided nontraumatic intracerebral hemorrhage (HCC) 08/01/2022   AVM (arteriovenous malformation) brain 07/24/2022   Asthma 07/24/2022   Major depressive disorder, recurrent episode, moderate (HCC) 02/07/2022   Sepsis secondary to UTI (HCC) 05/01/2020   VP (ventriculoperitoneal) shunt status 05/01/2020   Right ovarian cyst 05/01/2020   Elevated LFTs 05/01/2020   ONSET DATE: Sept 2023;   REFERRING DIAG:   I63.9 (ICD-10-CM) - CVA (cerebral vascular accident) (HCC)   THERAPY DIAG:  Muscle weakness (generalized)  Other lack of coordination  Rationale for Evaluation and Treatment: Rehabilitation  SUBJECTIVE:  SUBJECTIVE STATEMENT: Pt reports she was woke up early this morning with R foot pain. Pt. Reports she received botox treatment yesterday in her R hand, arm, and leg.  Pt accompanied by: self   PERTINENT HISTORY: Per Dr. Caralee Ates on 11/19/22: HPI [name] presents to establish care.  She is here with her mom.   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       PRECAUTIONS: None  WEIGHT BEARING RESTRICTIONS: No  PAIN:   06/12/2023: Pt. Reports pain in R foot was 8-9/10 this morning and when walking. Pt. Reports 0/10 pain in R foot when sitting down in therapy.  2/10 R forearm (tightness), 2-3/10 by end of session Are you having pain? Yes: NPRS scale: 4-5/10 Pain location:  Entire R side, arm and leg Pain description: pins and needles in the hand, arm is achy, moving the arm causes stabbing pain Aggravating factors: movement of the arm Relieving factors: rest; pt reports medication is minimally effective  FALLS: Has patient fallen in last 6 months? Yes. 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  Being independent."  OBJECTIVE:   HAND DOMINANCE: Left  ADLs: Overall ADLs: mostly performed with the LUE Transfers/ambulation related to ADLs: indep/extra time Eating: cuts food 1 handed Grooming: 1 handed (L) UB Dressing: occasional help to don clothes if tired after a shower; help with straightening clothes; struggles with clothing fasteners LB Dressing: wearing slip on shoes; difficulty with clothing fasteners  Toileting: indep Bathing: modified indep; pt makes attempts to engage the R arm while bathing but reports this is difficult Tub Shower transfers: modified indep Equipment:  see above  IADLs: Shopping: pt can go shopping accompanied by a family member Light housekeeping: 1 handed Meal Prep: cooking causes fatigue; mostly L handed Community mobility: uses rollator for community mobility; uses [employer] transportation Medication management: uses weekly pill organizer; Engineer, materials: mother manages (pt states she doesn't really have any bills)  Handwriting:  N/A (pt is L hand dominant)  MOBILITY STATUS:  ambulatory without AD; pt seeing PT for LE strengthening and balance deficits  POSTURE COMMENTS:  rounded shoulders Sitting balance: Moves/returns truncal midpoint >2 inches in all planes  ACTIVITY TOLERANCE: Activity tolerance: Pain in the R side significantly limits activity tolerance.  Pt reports she can get fatigued with ADLs and IADLs.  FUNCTIONAL OUTCOME MEASURES: FOTO: 44; 48 03/26/23: 48 05/30/23: 52  UPPER EXTREMITY ROM:    Active ROM Right eval Right 03/26/23 Right 04/16/23 Right 05/30/23 Left Eval WNL  Shoulder flexion 38 (60) 88 (118) 121 (127) 125 (140)   Shoulder abduction       Shoulder adduction 42 (51) 85 (105) 98 (110) 100 (120)   Shoulder  extension       Shoulder internal rotation  R thumb to posterior hip R thumb to lumbar spine R thumb to lumbar spine   Shoulder external rotation Hand to back of head (scaption and chin tuck) Hand to back of head (minimal scaption and minimal chin tuck) Hand to back of head without a chin tuck Hand to back of head without chin tuck, slight scaption   Elbow flexion WNL      Elbow extension WNL      Wrist flexion WNL      Wrist extension WNL      Wrist ulnar deviation       Wrist radial deviation       Wrist pronation WNL      Wrist supination WNL      (Blank rows = not tested)  R hand: Able to oppose each digit to thumb with extra time and able to make full composite fist  UPPER EXTREMITY MMT:     MMT Right eval Right 03/26/23 Right 04/16/23 Right 05/30/23 Left Eval 5/5  Shoulder flexion 3- 3- 3+ 4-   Shoulder abduction 3- 3- 3+ 4-   Shoulder adduction       Shoulder extension       Shoulder internal rotation 3+ 3+ 4- 4-   Shoulder external rotation 3+ 3+ 3+ 4-   Middle trapezius       Lower trapezius       Elbow flexion 4- 4- 4 4+   Elbow extension 4 4 4+ 4+   Wrist flexion 4- 4- 4 4+   Wrist extension 3+ 4- 4 4+   Wrist ulnar deviation       Wrist radial deviation       Wrist pronation       Wrist supination       (Blank rows = not tested)  HAND FUNCTION: Grip strength: Right: 4 lbs; Left: 50 lbs, Lateral pinch: Right: 0 lbs, Left: 17 lbs, and 3 point pinch: Right: 0 lbs, Left: 19 lbs Grip strength: Right: 18 lbs, Lateral pinch: Right 10 lbs, 3 point pinch: Right 6 lbs 04/16/23: Grip strength: Right: 22 lbs, lateral pinch: Right 11 lbs, 3 point pinch: Right: 6 lbs  05/30/23: Grip strength: Right: 20 lbs, lateral pinch: Right 9 lbs, 3 point pinch: Right: 6 lbs   COORDINATION: Finger Nose Finger test: able to touch nose with increased time 9 Hole Peg test: Right: unable (picked up a peg with repeat trials but unable to move the peg from horizontal to vertical position to  place into board) sec; Left: 24 sec 03/26/23: 5 min 11 sec 04/16/23: 4 min 54 sec  05/30/23: 3 min 6 sec   SENSATION: tingling in the R hand  Light touch: Impaired  Proprioception: WFL  EDEMA: N/A  MUSCLE TONE: RUE: Mild and Hypertonic  COGNITION: Overall cognitive status: Within functional limits for tasks assessed  VISION: Subjective report: Pt reports increased blurred vision following her stroke Baseline vision: Wears glasses all the time  VISION ASSESSMENT: Ocular ROM: WFL Gaze preference/alignment: gaze left; slight Tracking/Visual pursuits: Able to track stimulus in all quads without difficulty Saccades: WFL Visual Fields: no apparent deficits  PERCEPTION: WFL  PRAXIS: Impaired: Motor planning  OBSERVATIONS: Pt guards RUE with flexed elbow and shoulder IR/hand to abdomen during mobility.  R scapular elevation noting increased tension in R upper trap.  TODAY'S TREATMENT:        Moist heat to R shoulder applied for pain management/muscle relaxation.   Therapeutic Exercise:  Performed passive stretching throughout the RUE, including all planes for R shoulder in prep for engaging the RUE into therapeutic exercises.  Pt. Worked on R shoulder flexion/abduction, elbow extension while reaching out in front and to the side to grasp jumbo pegs. Facilitated hand strengthening with use of hand gripper set at 11.2# to remove jumbo pegs from pegboard x 1 trial using R hand. Pt. Completed 2nd trial with hand gripper set at 6.6#. Multiple rest breaks required throughout each trial due to R hand fatigue.  Intermittent vc/tactile cues to optimize grip on hand gripper, including making sure to wrap R SF to engage this digit into each gripping rep. Pt. Worked on using lateral pinch to place yellow, red, green, and blue resistive clips onto horizontal dowel x 1 trial. Pt. Worked on using three point pinch to place yellow resistive clips onto vertical dowel x 1 trial.   PATIENT  EDUCATION: Education details: Maintaining R shoulder depression during forward and lateral reaching patterns Person educated: Patient Education method: explanation, vc Education  comprehension: verbalized understanding  HOME EXERCISE PROGRAM: Soft blue theraputty exercises, blue foam block, cane stretches, WB through the RUE  GOALS: Goals reviewed with patient? Yes  SHORT TERM GOALS: Target date: 05/28/23 (6 weeks from recert on 04/16/23)  Pt will be indep to perform HEP for increasing RUE flexibility, strength, and coordination to better engage the RUE into daily tasks. Baseline: Not yet initiated; 03/26/23: using putty and engaging the R arm as able into daily tasks; 04/16/23: pt continues with putty and engaging the RUE into ADLs; will progress when able; 05/30/23: Pt doing cane stretches, WB through the RUE, occasionally using putty; issued foam block per pt preference Goal status: ongoing  LONG TERM GOALS: Target date: 07/09/23 (12 weeks from recert on 04/16/23)  Pt will increase FOTO score to 48 or better to indicate clinically relevant improvement in self perceived use of R arm for daily tasks.  Baseline: Eval: 44; 03/26/23: 48; 05/30/23: 52 Goal status: achieved/ongoing  2.  Pt will increase R active shoulder flexion to 140* (revised on 05/30/23) or better to achieve functional ROM for UB ADLs and reaching for ADL supplies. Baseline: Eval: R 38 degrees (passive 60); pt reaches during ADLs only with the L arm; 12/03/84: R 88* (passive 118); 04/16/23: R 121 degrees (passive 127), pt beginning to engage the RUE into reaching for light ADL supplies; 05/30/23: R 125 (passive 140) Goal status: ongoing  3.  Pt will increase R grip strength to 30 or more lbs to enable use of R hand to hold and carry a grocery bag in the R hand. Baseline: Eval: R grip 4 lbs, L 50 lbs; 03/26/23: R grip 18 lbs (pt still uses L hand to hold and carry light ADL supplies); 04/16/23: R grip 22 lbs; 05/30/23: R grip 20 lbs;  increased pain today in R forearm and pt also reports she has gotten away from using her putty as consistently as she had been.  Encouraged pt re-focus on foam block for squeezing and pinching for continued progression of hand strength Goal status: revised  4.  Pt will report <4/10 pain throughout the RUE to enable improved tolerance with using RUE during daily tasks.  Baseline: Eval: 6-7/10 pain throughout the R side (pt predominantly uses L hand for ADLs); 03/26/23: 6/10 pain; pt demonstrating less sensitivity to touch and tolerates stretching throughout the RUE without jumping or grimacing; 04/16/23: 5-6/10 pain, consistent with report on 03/26/23 (pt has had some medication adjustments with minimal improvement, and pt is now planning Botox at [facility] on July 10); 05/30/23: 6-7/10 pain beginning of session in R forearm, 2-3/10 by end of session following heat, stretch, WB/muscle massage.  First Botox appt scheduled 06/04/23 next week. Goal status: ongoing  5.  Pt will increase R hand dexterity/FMC skills to manipulate clothing fasteners with bilat hands. Baseline: Eval: pt avoids clothing fasteners or manages with extra time using L hand only (R 9 hole: unable, L 24 sec); 03/26/23: pt uses the R hand as a stabilizer with min vc (9 hole peg test 5 min 11 sec); 04/16/23: pt can now button and zip with bilat hands; inconsistent to tie laces (R 9 hole peg test 4 min 54 sec); 05/30/23: Extra time for clothing fasteners (9 hole 3 min 6 sec) Goal status: ongoing  ASSESSMENT: CLINICAL IMPRESSION: Pt. Reported that her R foot has been hurting since this morning and reports that it feels better when sitting down. Pt. Reports received Botox yesterday and also went to the cardiologist.  Pt. Reported that all of her appointments went well yesterday. Pt. Tolerated passive stretching throughout the RUE. Pt. Was able to use lateral pinch to place yellow, red, green and blue resistive clips onto horizontal dowel however required  increased time and increased verbal cues to ensure lateral pinch was being used throughout. Pt. Was able to use three point pinch to place yellow resistive clips onto horizontal dowel with increased time and verbal cues for proper form of three point pinch. Pt. Was able to reach out and grasp jumbo pegs with the R hand. Pt. Was able to tolerate 11.2# on the hand gripper to remove jumbo pegs from pegboard x 1 trial however required hand gripper to be adjusted to 6.6# for the 2nd trial. Pt. Required frequent rest breaks. Pt will continue to benefit from skilled OT to reduce R shoulder stiffness, improve RUE strength, FMC, GMC, and normalize tone throughout the RUE in order to maximize functional use of the RUE with daily tasks.    PERFORMANCE DEFICITS: in functional skills including ADLs, IADLs, coordination, dexterity, sensation, tone, ROM, strength, pain, flexibility, Fine motor control, Gross motor control, mobility, balance, body mechanics, decreased knowledge of use of DME, vision, and UE functional use.  IMPAIRMENTS: are limiting patient from ADLs, IADLs, rest and sleep, work, and leisure.   CO-MORBIDITIES: has co-morbidities such as CHF, depression  that affects occupational performance. Patient will benefit from skilled OT to address above impairments and improve overall function.  MODIFICATION OR ASSISTANCE TO COMPLETE EVALUATION: No modification of tasks or assist necessary to complete an evaluation.  OT OCCUPATIONAL PROFILE AND HISTORY: Problem focused assessment: Including review of records relating to presenting problem.  CLINICAL DECISION MAKING: Moderate - several treatment options, min-mod task modification necessary  REHAB POTENTIAL: Good  EVALUATION COMPLEXITY: Moderate    PLAN:  OT FREQUENCY: 1-2x/week (pending any visit limitations with insurance), but would benefit from 2x/week  OT DURATION: 12 weeks  PLANNED INTERVENTIONS: self care/ADL training, therapeutic exercise,  therapeutic activity, neuromuscular re-education, manual therapy, passive range of motion, balance training, moist heat, cryotherapy, patient/family education, coping strategies training, and DME and/or AE instructions  RECOMMENDED OTHER SERVICES: OT recommended pt follow up with her eye doctor as she does report some increased blurred vision since her CVA  CONSULTED AND AGREED WITH PLAN OF CARE: Patient  PLAN FOR NEXT SESSION: see above  Herma Carson, Student-OT 06/12/2023, 1:12 PM  This entire session was performed under direct supervision and direction of a licensed therapist/therapist assistant . I have personally read, edited and approve of the note as written.  Danelle Earthly, MS, OTR/L

## 2023-06-16 ENCOUNTER — Ambulatory Visit: Payer: Medicaid Other

## 2023-06-16 DIAGNOSIS — R278 Other lack of coordination: Secondary | ICD-10-CM | POA: Diagnosis not present

## 2023-06-16 DIAGNOSIS — M6281 Muscle weakness (generalized): Secondary | ICD-10-CM | POA: Diagnosis not present

## 2023-06-16 DIAGNOSIS — Z7689 Persons encountering health services in other specified circumstances: Secondary | ICD-10-CM | POA: Diagnosis not present

## 2023-06-16 NOTE — Therapy (Unsigned)
OUTPATIENT OCCUPATIONAL THERAPY NEURO TREATMENT NOTE   Patient Name: Anna Tapia MRN: [id number] DOB:February 19, 1991, 32 y.o., female Today's Date: 06/16/2023  PCP: Dr. Margarita Mail REFERRING PROVIDER: Dr. Margarita Mail   OT End of Session - 06/16/23 1321     Visit Number 24    Number of Visits 36    Date for OT Re-Evaluation 07/09/23    Authorization Type Visit 21 of 24 as of 06/16/23    Authorization - Visit Number 21    Authorization - Number of Visits 24    Progress Note Due on Visit 10    OT Start Time 1315    OT Stop Time 1400    OT Time Calculation (min) 45 min    Activity Tolerance Patient tolerated treatment well    Behavior During Therapy WFL for tasks assessed/performed            Past Medical History:  Diagnosis Date   Arteriovenous malformation of brain    a. s/p remote coiling.   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   Congenital CHF (congestive heart failure) (HCC)    a. 03/2023 Echo: EF 60-65%, no rwma, nl RV fxn, RVSP 29.35mmHg. Mild MR. Mild Ao sclerosis w/o stenosis. Ao root 38mm.   COVID 2022   Hemorrhagic stroke (HCC) 2023   a. L-sided midbrain, thalamic ICH in setting of PCA/ACA aneurysm s/p embolization.   History of broken collarbone 2020   Hydrocephalus (HCC)    a. s/p VP shunt in childhood.   Precordial chest pain    a. 03/2023 MV: No ischemia or infarct.  EF greater than 65%.  No significant coronary calcification.   Sepsis secondary to UTI (HCC) 2021   Past Surgical History:  Procedure Laterality Date   VENTRICULOPERITONEAL SHUNT     Patient Active Problem List   Diagnosis Date Noted   CHF (congestive heart failure) (HCC) 03/25/2023   Left-sided nontraumatic intracerebral hemorrhage (HCC) 08/01/2022   AVM (arteriovenous malformation) brain 07/24/2022   Asthma 07/24/2022   Major depressive disorder, recurrent episode, moderate (HCC) 02/07/2022   Sepsis secondary to UTI (HCC) 05/01/2020    VP (ventriculoperitoneal) shunt status 05/01/2020   Right ovarian cyst 05/01/2020   Elevated LFTs 05/01/2020   ONSET DATE: Sept 2023;   REFERRING DIAG:  I63.9 (ICD-10-CM) - CVA (cerebral vascular accident) (HCC)   THERAPY DIAG:  Muscle weakness (generalized)  Other lack of coordination  Rationale for Evaluation and Treatment: Rehabilitation  SUBJECTIVE:  SUBJECTIVE STATEMENT: Pt reports that she had a good weekend and was able to do a little shopping with her aunt.  Pt accompanied by: self   PERTINENT HISTORY: Per Dr. Caralee Ates on 11/19/22: HPI [name] presents to establish care.  She is here with her mom.   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       PRECAUTIONS: None  WEIGHT BEARING RESTRICTIONS: No  PAIN:   06/16/2023: Pt reports no pain today 2/10 R forearm (tightness), 2-3/10 by end of session Are  you having pain? Yes: NPRS scale: 4-5/10 Pain location: Entire R side, arm and leg Pain description: pins and needles in the hand, arm is achy, moving the arm causes stabbing pain Aggravating factors: movement of the arm Relieving factors: rest; pt reports medication is minimally effective  FALLS: Has patient fallen in last 6 months? Yes. 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  Being independent."  OBJECTIVE:   HAND DOMINANCE: Left  ADLs: Overall ADLs: mostly performed with the LUE Transfers/ambulation related to ADLs: indep/extra time Eating: cuts food 1 handed Grooming: 1 handed (L) UB Dressing: occasional help to don clothes if tired after a shower; help with straightening clothes; struggles with clothing fasteners LB Dressing: wearing slip on shoes; difficulty with clothing fasteners  Toileting: indep Bathing: modified indep; pt makes attempts to engage the R arm while bathing but reports this is difficult Tub Shower transfers: modified indep Equipment:  see above  IADLs: Shopping: pt can go shopping accompanied by a family member Light housekeeping: 1 handed Meal Prep: cooking causes fatigue; mostly L handed Community mobility: uses rollator for community mobility; uses [employer] transportation Medication management: uses weekly pill organizer; Engineer, materials: mother manages (pt states she doesn't really have any bills)  Handwriting:  N/A (pt is L hand dominant)  MOBILITY STATUS:  ambulatory without AD; pt seeing PT for LE strengthening and balance deficits  POSTURE COMMENTS:  rounded shoulders Sitting balance: Moves/returns truncal midpoint >2 inches in all planes  ACTIVITY TOLERANCE: Activity tolerance: Pain in the R side significantly limits activity tolerance.  Pt reports she can get fatigued with ADLs and IADLs.  FUNCTIONAL OUTCOME MEASURES: FOTO: 44; 48 03/26/23: 48 05/30/23: 52  UPPER EXTREMITY ROM:    Active ROM Right eval Right 03/26/23 Right 04/16/23 Right 05/30/23 Left Eval WNL  Shoulder flexion 38 (60) 88 (118) 121 (127) 125 (140)   Shoulder abduction       Shoulder adduction 42 (51) 85 (105) 98 (110) 100 (120)   Shoulder  extension       Shoulder internal rotation  R thumb to posterior hip R thumb to lumbar spine R thumb to lumbar spine   Shoulder external rotation Hand to back of head (scaption and chin tuck) Hand to back of head (minimal scaption and minimal chin tuck) Hand to back of head without a chin tuck Hand to back of head without chin tuck, slight scaption   Elbow flexion WNL      Elbow extension WNL      Wrist flexion WNL      Wrist extension WNL      Wrist ulnar deviation       Wrist radial deviation       Wrist pronation WNL      Wrist supination WNL      (Blank rows = not tested)  R hand: Able to oppose each digit to thumb with extra time and able to make full composite fist  UPPER EXTREMITY MMT:     MMT Right eval Right  03/26/23 Right 04/16/23 Right 05/30/23 Left Eval 5/5  Shoulder flexion 3- 3- 3+ 4-   Shoulder abduction 3- 3- 3+ 4-   Shoulder adduction       Shoulder extension       Shoulder internal rotation 3+ 3+ 4- 4-   Shoulder external rotation 3+ 3+ 3+ 4-   Middle trapezius       Lower trapezius       Elbow flexion 4- 4- 4 4+   Elbow extension 4 4 4+ 4+   Wrist flexion 4- 4- 4 4+   Wrist extension 3+ 4- 4 4+   Wrist ulnar deviation       Wrist radial deviation       Wrist pronation       Wrist supination       (Blank rows = not tested)  HAND FUNCTION: Grip strength: Right: 4 lbs; Left: 50 lbs, Lateral pinch: Right: 0 lbs, Left: 17 lbs, and 3 point pinch: Right: 0 lbs, Left: 19 lbs Grip strength: Right: 18 lbs, Lateral pinch: Right 10 lbs, 3 point pinch: Right 6 lbs 04/16/23: Grip strength: Right: 22 lbs, lateral pinch: Right 11 lbs, 3 point pinch: Right: 6 lbs  05/30/23: Grip strength: Right: 20 lbs, lateral pinch: Right 9 lbs, 3 point pinch: Right: 6 lbs   COORDINATION: Finger Nose Finger test: able to touch nose with increased time 9 Hole Peg test: Right: unable (picked up a peg with repeat trials but unable to move the peg from horizontal to vertical position to  place into board) sec; Left: 24 sec 03/26/23: 5 min 11 sec 04/16/23: 4 min 54 sec  05/30/23: 3 min 6 sec   SENSATION: tingling in the R hand  Light touch: Impaired  Proprioception: WFL  EDEMA: N/A  MUSCLE TONE: RUE: Mild and Hypertonic  COGNITION: Overall cognitive status: Within functional limits for tasks assessed  VISION: Subjective report: Pt reports increased blurred vision following her stroke Baseline vision: Wears glasses all the time  VISION ASSESSMENT: Ocular ROM: WFL Gaze preference/alignment: gaze left; slight Tracking/Visual pursuits: Able to track stimulus in all quads without difficulty Saccades: WFL Visual Fields: no apparent deficits  PERCEPTION: WFL  PRAXIS: Impaired: Motor planning  OBSERVATIONS: Pt guards RUE with flexed elbow and shoulder IR/hand to abdomen during mobility.  R scapular elevation noting increased tension in R upper trap.  TODAY'S TREATMENT:        Moist heat to R shoulder applied for pain management/muscle relaxation.   Therapeutic Exercise:  Performed passive stretching throughout the RUE, including all planes for R shoulder in prep for engaging the RUE into therapeutic exercises.  Facilitated hand strengthening with use of hand gripper set at min-mod resistance with 2 red bands for 4 sets 10 reps, then 1 red band for a 5th set, performed with rest breaks after each set and all 5 sets spread throughout duration of session.  Facilitated pinch strengthening with use of therapy resistant clothespins to target lateral pinch on the R hand.  Pt attempted a 3 point pinch but was unable to maintain this prehension pattern today with the clips.  Pt completed 2 trials for lateral pinch with yellow, red, and green resistive clips, moving clips on and off a vertical dowel.    Wooden velcro blocks 2 and 3 point pins  PATIENT EDUCATION: Education details: Maintaining R shoulder depression during forward and lateral reaching patterns Person educated:  Patient Education method: explanation, vc Education comprehension: verbalized understanding  HOME EXERCISE  PROGRAM: Soft blue theraputty exercises, blue foam block, cane stretches, WB through the RUE  GOALS: Goals reviewed with patient? Yes  SHORT TERM GOALS: Target date: 05/28/23 (6 weeks from recert on 04/16/23)  Pt will be indep to perform HEP for increasing RUE flexibility, strength, and coordination to better engage the RUE into daily tasks. Baseline: Not yet initiated; 03/26/23: using putty and engaging the R arm as able into daily tasks; 04/16/23: pt continues with putty and engaging the RUE into ADLs; will progress when able; 05/30/23: Pt doing cane stretches, WB through the RUE, occasionally using putty; issued foam block per pt preference Goal status: ongoing  LONG TERM GOALS: Target date: 07/09/23 (12 weeks from recert on 04/16/23)  Pt will increase FOTO score to 48 or better to indicate clinically relevant improvement in self perceived use of R arm for daily tasks.  Baseline: Eval: 44; 03/26/23: 48; 05/30/23: 52 Goal status: achieved/ongoing  2.  Pt will increase R active shoulder flexion to 140* (revised on 05/30/23) or better to achieve functional ROM for UB ADLs and reaching for ADL supplies. Baseline: Eval: R 38 degrees (passive 60); pt reaches during ADLs only with the L arm; 8/46/96: R 88* (passive 118); 04/16/23: R 121 degrees (passive 127), pt beginning to engage the RUE into reaching for light ADL supplies; 05/30/23: R 125 (passive 140) Goal status: ongoing  3.  Pt will increase R grip strength to 30 or more lbs to enable use of R hand to hold and carry a grocery bag in the R hand. Baseline: Eval: R grip 4 lbs, L 50 lbs; 03/26/23: R grip 18 lbs (pt still uses L hand to hold and carry light ADL supplies); 04/16/23: R grip 22 lbs; 05/30/23: R grip 20 lbs; increased pain today in R forearm and pt also reports she has gotten away from using her putty as consistently as she had been.   Encouraged pt re-focus on foam block for squeezing and pinching for continued progression of hand strength Goal status: revised  4.  Pt will report <4/10 pain throughout the RUE to enable improved tolerance with using RUE during daily tasks.  Baseline: Eval: 6-7/10 pain throughout the R side (pt predominantly uses L hand for ADLs); 03/26/23: 6/10 pain; pt demonstrating less sensitivity to touch and tolerates stretching throughout the RUE without jumping or grimacing; 04/16/23: 5-6/10 pain, consistent with report on 03/26/23 (pt has had some medication adjustments with minimal improvement, and pt is now planning Botox at [facility] on July 10); 05/30/23: 6-7/10 pain beginning of session in R forearm, 2-3/10 by end of session following heat, stretch, WB/muscle massage.  First Botox appt scheduled 06/04/23 next week. Goal status: ongoing  5.  Pt will increase R hand dexterity/FMC skills to manipulate clothing fasteners with bilat hands. Baseline: Eval: pt avoids clothing fasteners or manages with extra time using L hand only (R 9 hole: unable, L 24 sec); 03/26/23: pt uses the R hand as a stabilizer with min vc (9 hole peg test 5 min 11 sec); 04/16/23: pt can now button and zip with bilat hands; inconsistent to tie laces (R 9 hole peg test 4 min 54 sec); 05/30/23: Extra time for clothing fasteners (9 hole 3 min 6 sec) Goal status: ongoing  ASSESSMENT: CLINICAL IMPRESSION: Pt. Reported that her R foot has been hurting since this morning and reports that it feels better when sitting down. Pt. Reports received Botox yesterday and also went to the cardiologist. Pt. Reported that all of her  appointments went well yesterday. Pt. Tolerated passive stretching throughout the RUE. Pt. Was able to use lateral pinch to place yellow, red, green and blue resistive clips onto horizontal dowel however required increased time and increased verbal cues to ensure lateral pinch was being used throughout. Pt. Was able to use three point  pinch to place yellow resistive clips onto horizontal dowel with increased time and verbal cues for proper form of three point pinch. Pt. Was able to reach out and grasp jumbo pegs with the R hand. Pt. Was able to tolerate 11.2# on the hand gripper to remove jumbo pegs from pegboard x 1 trial however required hand gripper to be adjusted to 6.6# for the 2nd trial. Pt. Required frequent rest breaks. Pt will continue to benefit from skilled OT to reduce R shoulder stiffness, improve RUE strength, FMC, GMC, and normalize tone throughout the RUE in order to maximize functional use of the RUE with daily tasks.    PERFORMANCE DEFICITS: in functional skills including ADLs, IADLs, coordination, dexterity, sensation, tone, ROM, strength, pain, flexibility, Fine motor control, Gross motor control, mobility, balance, body mechanics, decreased knowledge of use of DME, vision, and UE functional use.  IMPAIRMENTS: are limiting patient from ADLs, IADLs, rest and sleep, work, and leisure.   CO-MORBIDITIES: has co-morbidities such as CHF, depression  that affects occupational performance. Patient will benefit from skilled OT to address above impairments and improve overall function.  MODIFICATION OR ASSISTANCE TO COMPLETE EVALUATION: No modification of tasks or assist necessary to complete an evaluation.  OT OCCUPATIONAL PROFILE AND HISTORY: Problem focused assessment: Including review of records relating to presenting problem.  CLINICAL DECISION MAKING: Moderate - several treatment options, min-mod task modification necessary  REHAB POTENTIAL: Good  EVALUATION COMPLEXITY: Moderate    PLAN:  OT FREQUENCY: 1-2x/week (pending any visit limitations with insurance), but would benefit from 2x/week  OT DURATION: 12 weeks  PLANNED INTERVENTIONS: self care/ADL training, therapeutic exercise, therapeutic activity, neuromuscular re-education, manual therapy, passive range of motion, balance training, moist heat,  cryotherapy, patient/family education, coping strategies training, and DME and/or AE instructions  RECOMMENDED OTHER SERVICES: OT recommended pt follow up with her eye doctor as she does report some increased blurred vision since her CVA  CONSULTED AND AGREED WITH PLAN OF CARE: Patient  PLAN FOR NEXT SESSION: see above  Otis Dials, OT 06/16/2023, 1:34 PM  This entire session was performed under direct supervision and direction of a licensed therapist/therapist assistant . I have personally read, edited and approve of the note as written.  Danelle Earthly, MS, OTR/L

## 2023-06-20 ENCOUNTER — Ambulatory Visit: Payer: Medicaid Other

## 2023-06-20 DIAGNOSIS — M6281 Muscle weakness (generalized): Secondary | ICD-10-CM

## 2023-06-20 DIAGNOSIS — Z7689 Persons encountering health services in other specified circumstances: Secondary | ICD-10-CM | POA: Diagnosis not present

## 2023-06-20 DIAGNOSIS — R278 Other lack of coordination: Secondary | ICD-10-CM

## 2023-06-20 NOTE — Therapy (Signed)
OUTPATIENT OCCUPATIONAL THERAPY NEURO TREATMENT NOTE   Patient Name: Anna Tapia MRN: [id number] DOB:[birth year], 32 y.o., female Today's Date: 06/20/2023  PCP: Dr. Margarita Mail REFERRING PROVIDER: Dr. Margarita Mail   OT End of Session - 06/20/23 1128     Visit Number 24    Number of Visits 36    Date for OT Re-Evaluation 07/09/23    Authorization Type Visit 24 of 24 as of 06/20/23    Authorization - Visit Number 24    Authorization - Number of Visits 24    Progress Note Due on Visit 10    OT Start Time 1015    OT Stop Time 1100    OT Time Calculation (min) 45 min    Activity Tolerance Patient tolerated treatment well    Behavior During Therapy WFL for tasks assessed/performed            Past Medical History:  Diagnosis Date   Arteriovenous malformation of brain    a. s/p remote coiling.   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   Congenital CHF (congestive heart failure) (HCC)    a. 03/2023 Echo: EF 60-65%, no rwma, nl RV fxn, RVSP 29.74mmHg. Mild MR. Mild Ao sclerosis w/o stenosis. Ao root 38mm.   COVID 2022   Hemorrhagic stroke (HCC) 2023   a. L-sided midbrain, thalamic ICH in setting of PCA/ACA aneurysm s/p embolization.   History of broken collarbone 2020   Hydrocephalus (HCC)    a. s/p VP shunt in childhood.   Precordial chest pain    a. 03/2023 MV: No ischemia or infarct.  EF greater than 65%.  No significant coronary calcification.   Sepsis secondary to UTI (HCC) 2021   Past Surgical History:  Procedure Laterality Date   VENTRICULOPERITONEAL SHUNT     Patient Active Problem List   Diagnosis Date Noted   CHF (congestive heart failure) (HCC) 03/25/2023   Left-sided nontraumatic intracerebral hemorrhage (HCC) 08/01/2022   AVM (arteriovenous malformation) brain 07/24/2022   Asthma 07/24/2022   Major depressive disorder, recurrent episode, moderate (HCC) 02/07/2022   Sepsis secondary to UTI (HCC) 05/01/2020    VP (ventriculoperitoneal) shunt status 05/01/2020   Right ovarian cyst 05/01/2020   Elevated LFTs 05/01/2020   ONSET DATE: Sept 2023;   REFERRING DIAG:  I63.9 (ICD-10-CM) - CVA (cerebral vascular accident) (HCC)   THERAPY DIAG:  Muscle weakness (generalized)  Other lack of coordination  Rationale for Evaluation and Treatment: Rehabilitation  SUBJECTIVE:  SUBJECTIVE STATEMENT: Pt reports she can tell the Botox has helped her R arm to relax more and she reports less pain and stiffness at the shoulder.  Pt accompanied by: self   PERTINENT HISTORY: Per Dr. Caralee Ates on 11/19/22: HPI [name] presents to establish care.  She is here with her mom.   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       PRECAUTIONS: None  WEIGHT BEARING RESTRICTIONS: No  PAIN: 06/20/2023: Pt reports no pain today 2/10 R forearm (tightness), 2-3/10 by  end of session Are you having pain? Yes: NPRS scale: 4-5/10 Pain location: Entire R side, arm and leg Pain description: pins and needles in the hand, arm is achy, moving the arm causes stabbing pain Aggravating factors: movement of the arm Relieving factors: rest; pt reports medication is minimally effective  FALLS: Has patient fallen in last 6 months? Yes. 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  Being independent."  OBJECTIVE:   HAND DOMINANCE: Left  ADLs: Overall ADLs: mostly performed with the LUE Transfers/ambulation related to ADLs: indep/extra time Eating: cuts food 1 handed Grooming: 1 handed (L) UB Dressing: occasional help to don clothes if tired after a shower; help with straightening clothes; struggles with clothing fasteners LB Dressing: wearing slip on shoes; difficulty with clothing fasteners  Toileting: indep Bathing: modified indep; pt makes attempts to engage the R arm while bathing but reports this is difficult Tub Shower transfers: modified indep Equipment:  see above  IADLs: Shopping: pt can go shopping accompanied by a family member Light housekeeping: 1 handed Meal Prep: cooking causes fatigue; mostly L handed Community mobility: uses rollator for community mobility; uses [employer] transportation Medication management: uses weekly pill organizer; Engineer, materials: mother manages (pt states she doesn't really have any bills)  Handwriting:  N/A (pt is L hand dominant)  MOBILITY STATUS:  ambulatory without AD; pt seeing PT for LE strengthening and balance deficits  POSTURE COMMENTS:  rounded shoulders Sitting balance: Moves/returns truncal midpoint >2 inches in all planes  ACTIVITY TOLERANCE: Activity tolerance: Pain in the R side significantly limits activity tolerance.  Pt reports she can get fatigued with ADLs and IADLs.  FUNCTIONAL OUTCOME MEASURES: FOTO: 44; 48 03/26/23: 48 05/30/23: 52  UPPER EXTREMITY ROM:    Active ROM Right eval Right 03/26/23 Right 04/16/23 Right 05/30/23 Left Eval WNL  Shoulder flexion 38 (60) 88 (118) 121 (127) 125 (140)   Shoulder abduction       Shoulder adduction 42 (51) 85 (105)  98 (110) 100 (120)   Shoulder extension       Shoulder internal rotation  R thumb to posterior hip R thumb to lumbar spine R thumb to lumbar spine   Shoulder external rotation Hand to back of head (scaption and chin tuck) Hand to back of head (minimal scaption and minimal chin tuck) Hand to back of head without a chin tuck Hand to back of head without chin tuck, slight scaption   Elbow flexion WNL      Elbow extension WNL      Wrist flexion WNL      Wrist extension WNL      Wrist ulnar deviation       Wrist radial deviation       Wrist pronation WNL      Wrist supination WNL      (Blank rows = not tested)  R hand: Able to oppose each digit to thumb with extra time and able to make full composite fist  UPPER EXTREMITY MMT:  MMT Right eval Right 03/26/23 Right 04/16/23 Right 05/30/23 Left Eval 5/5  Shoulder flexion 3- 3- 3+ 4-   Shoulder abduction 3- 3- 3+ 4-   Shoulder adduction       Shoulder extension       Shoulder internal rotation 3+ 3+ 4- 4-   Shoulder external rotation 3+ 3+ 3+ 4-   Middle trapezius       Lower trapezius       Elbow flexion 4- 4- 4 4+   Elbow extension 4 4 4+ 4+   Wrist flexion 4- 4- 4 4+   Wrist extension 3+ 4- 4 4+   Wrist ulnar deviation       Wrist radial deviation       Wrist pronation       Wrist supination       (Blank rows = not tested)  HAND FUNCTION: Grip strength: Right: 4 lbs; Left: 50 lbs, Lateral pinch: Right: 0 lbs, Left: 17 lbs, and 3 point pinch: Right: 0 lbs, Left: 19 lbs Grip strength: Right: 18 lbs, Lateral pinch: Right 10 lbs, 3 point pinch: Right 6 lbs 04/16/23: Grip strength: Right: 22 lbs, lateral pinch: Right 11 lbs, 3 point pinch: Right: 6 lbs  05/30/23: Grip strength: Right: 20 lbs, lateral pinch: Right 9 lbs, 3 point pinch: Right: 6 lbs   COORDINATION: Finger Nose Finger test: able to touch nose with increased time 9 Hole Peg test: Right: unable (picked up a peg with repeat trials but unable to move the peg from  horizontal to vertical position to place into board) sec; Left: 24 sec 03/26/23: 5 min 11 sec 04/16/23: 4 min 54 sec  05/30/23: 3 min 6 sec   SENSATION: tingling in the R hand  Light touch: Impaired  Proprioception: WFL  EDEMA: N/A  MUSCLE TONE: RUE: Mild and Hypertonic  COGNITION: Overall cognitive status: Within functional limits for tasks assessed  VISION: Subjective report: Pt reports increased blurred vision following her stroke Baseline vision: Wears glasses all the time  VISION ASSESSMENT: Ocular ROM: WFL Gaze preference/alignment: gaze left; slight Tracking/Visual pursuits: Able to track stimulus in all quads without difficulty Saccades: WFL Visual Fields: no apparent deficits  PERCEPTION: WFL  PRAXIS: Impaired: Motor planning  OBSERVATIONS: Pt guards RUE with flexed elbow and shoulder IR/hand to abdomen during mobility.  R scapular elevation noting increased tension in R upper trap.  TODAY'S TREATMENT:        Moist heat to R shoulder applied for pain management/muscle relaxation used simultaneous to therapeutic exercises noted below.   Therapeutic Exercise:  Performed passive stretching throughout the RUE, including all planes for R shoulder, elbow ext, forearm pron/sup, wrist flex/ext/UD/RD, and digit flex/ext, and thumb radial add in prep for engaging the RUE into therapeutic exercises and neuro re-ed activities.   Facilitated pinch strengthening with use of therapy resistant clothespins to target lateral pinch on the R hand.  Pt attempted a 3 point pinch but was unable to maintain this prehension pattern today to place the clips onto a vertical dowel.  Instead, pt placed clips for a 2nd trial with a lateral pinch, and practiced using a 3 point pinch to remove them from the dowel.  Pt able to manage light and moderate resistance colors (yellow, red, green) with a lateral pinch, and only the light resistance colors to remove them with a 3 point pinch.  Pt not yet able to  manage strong resistance colors, which include the blue and black clips.  Neuro re-ed: Facilitated grasp/release and forward and lateral reaching patterns working with a 3 point pinch to place on and pull off wooden blocks from a velcro board.  Board position was moved to promote the forward and lateral reaching patterns.  Pt required initial passive stretching for R thumb palmar abd and hand over hand reps to formulate a 3 point pinch pattern.  Pt was able to manipulate blocks with the thumb, index, and long finger, but unable to maintain thumb IP extension and PIP extension of the IF and LF, so blocks often slipped from fingers.  Pt requires visual and verbal cues to isolate proximal movements from distal movements to reduce compensatory leaning when reaching for blocks.  Vc to engage the R 5th finger into a flexor pattern to achieve normalized movement patterns when grasping as this digit tends to fully extend when pt is not focused on her hand position.  PATIENT EDUCATION: Education details: Reinforced high reps of grasp/release for continued motor planning gains and WB through the hand to normalize tone throughout the RUE Person educated: Patient Education method: explanation, vc, demo Education comprehension: verbalized understanding, demonstrated understanding  HOME EXERCISE PROGRAM: Soft blue theraputty exercises, blue foam block, cane stretches, WB through the RUE  GOALS: Goals reviewed with patient? Yes  SHORT TERM GOALS: Target date: 05/28/23 (6 weeks from recert on 04/16/23)  Pt will be indep to perform HEP for increasing RUE flexibility, strength, and coordination to better engage the RUE into daily tasks. Baseline: Not yet initiated; 03/26/23: using putty and engaging the R arm as able into daily tasks; 04/16/23: pt continues with putty and engaging the RUE into ADLs; will progress when able; 05/30/23: Pt doing cane stretches, WB through the RUE, occasionally using putty; issued foam  block per pt preference Goal status: ongoing  LONG TERM GOALS: Target date: 07/09/23 (12 weeks from recert on 04/16/23)  Pt will increase FOTO score to 48 or better to indicate clinically relevant improvement in self perceived use of R arm for daily tasks.  Baseline: Eval: 44; 03/26/23: 48; 05/30/23: 52 Goal status: achieved/ongoing  2.  Pt will increase R active shoulder flexion to 140* (revised on 05/30/23) or better to achieve functional ROM for UB ADLs and reaching for ADL supplies. Baseline: Eval: R 38 degrees (passive 60); pt reaches during ADLs only with the L arm; 1/61/09: R 88* (passive 118); 04/16/23: R 121 degrees (passive 127), pt beginning to engage the RUE into reaching for light ADL supplies; 05/30/23: R 125 (passive 140) Goal status: ongoing  3.  Pt will increase R grip strength to 30 or more lbs to enable use of R hand to hold and carry a grocery bag in the R hand. Baseline: Eval: R grip 4 lbs, L 50 lbs; 03/26/23: R grip 18 lbs (pt still uses L hand to hold and carry light ADL supplies); 04/16/23: R grip 22 lbs; 05/30/23: R grip 20 lbs; increased pain today in R forearm and pt also reports she has gotten away from using her putty as consistently as she had been.  Encouraged pt re-focus on foam block for squeezing and pinching for continued progression of hand strength Goal status: revised  4.  Pt will report <4/10 pain throughout the RUE to enable improved tolerance with using RUE during daily tasks.  Baseline: Eval: 6-7/10 pain throughout the R side (pt predominantly uses L hand for ADLs); 03/26/23: 6/10 pain; pt demonstrating less sensitivity to touch and tolerates stretching throughout the RUE without jumping  or grimacing; 04/16/23: 5-6/10 pain, consistent with report on 03/26/23 (pt has had some medication adjustments with minimal improvement, and pt is now planning B[facility] on July 10); 05/30/23: 6-7/10 pain beginning of session in R forearm, 2-3/10 by end of session following heat,  stretch, WB/muscle massage.  First Botox appt scheduled 06/04/23 next week. Goal status: ongoing  5.  Pt will increase R hand dexterity/FMC skills to manipulate clothing fasteners with bilat hands. Baseline: Eval: pt avoids clothing fasteners or manages with extra time using L hand only (R 9 hole: unable, L 24 sec); 03/26/23: pt uses the R hand as a stabilizer with min vc (9 hole peg test 5 min 11 sec); 04/16/23: pt can now button and zip with bilat hands; inconsistent to tie laces (R 9 hole peg test 4 min 54 sec); 05/30/23: Extra time for clothing fasteners (9 hole 3 min 6 sec) Goal status: ongoing  ASSESSMENT: CLINICAL IMPRESSION: Good tolerance to all therapeutic exercises and neuro re-ed activities this date.  Noted reduced tightness in the bicep and forearm this date, with pt acknowledging no pain and benefit from recent Botox injections.  Pt reports that she's been consistent to perform WB through the hand at home to normalize tone, and pt is requiring fewer cues to relax the R arm at her side when ambulating, improved from constant flexor synergy pattern with elbow flexed and shoulder adducted to the abdomen.  Pt continues to make steady gains with engaging the R small finger into grasping patterns, but pt continues to acknowledge that it is not natural and she has to focus on flexing this digit into palm, otherwise this digit remains extended from spasticity.  Continued focus on digit isolation skills to formulate a 3 point pinch pattern.  Pt required initial passive stretching for R thumb palmar abd and hand over hand reps to formulate a 3 point pinch pattern to manipulate a resistive clothespin or pull a velcro block from board.  Pt was able to manipulate blocks and light resistance clips with the thumb, index, and long finger, but unable to maintain thumb IP extension and PIP extension of the IF and LF, so blocks and clips often slipped from fingers.  Pt remains highly motivated with OT goals and  continues to show good carryover with HEP.   Pt uses the R hand as a fair assist to the L with all self care tasks, and pt is starting to develop some dexterity with pinching patterns, as well as starting to be able to repositioning larger objects (a jumbo peg) within her fingertips, ie moving a peg from a horizontal position to a vertical position.  Pt will continue to benefit from skilled OT for neuro re-education throughout the RUE while normalizing tone to achieve normalized movement patterns and to maximize prehension and manipulation of ADL supplies, working to increase overall use of the R dominant arm for self care tasks.   PERFORMANCE DEFICITS: in functional skills including ADLs, IADLs, coordination, dexterity, sensation, tone, ROM, strength, pain, flexibility, Fine motor control, Gross motor control, mobility, balance, body mechanics, decreased knowledge of use of DME, vision, and UE functional use.  IMPAIRMENTS: are limiting patient from ADLs, IADLs, rest and sleep, work, and leisure.   CO-MORBIDITIES: has co-morbidities such as CHF, depression  that affects occupational performance. Patient will benefit from skilled OT to address above impairments and improve overall function.  MODIFICATION OR ASSISTANCE TO COMPLETE EVALUATION: No modification of tasks or assist necessary to complete an evaluation.  OT OCCUPATIONAL PROFILE AND HISTORY: Problem focused assessment: Including review of records relating to presenting problem.  CLINICAL DECISION MAKING: Moderate - several treatment options, min-mod task modification necessary  REHAB POTENTIAL: Good  EVALUATION COMPLEXITY: Moderate    PLAN:  OT FREQUENCY: 1-2x/week (pending any visit limitations with insurance), but would benefit from 2x/week  OT DURATION: 12 weeks  PLANNED INTERVENTIONS: self care/ADL training, therapeutic exercise, therapeutic activity, neuromuscular re-education, manual therapy, passive range of motion, balance  training, moist heat, cryotherapy, patient/family education, coping strategies training, and DME and/or AE instructions  RECOMMENDED OTHER SERVICES: OT recommended pt follow up with her eye doctor as she does report some increased blurred vision since her CVA  CONSULTED AND AGREED WITH PLAN OF CARE: Patient  PLAN FOR NEXT SESSION: see above  Otis Dials, OT 06/20/2023, 11:30 AM   Danelle Earthly, MS, OTR/L

## 2023-06-23 ENCOUNTER — Ambulatory Visit: Payer: Medicaid Other

## 2023-06-25 ENCOUNTER — Ambulatory Visit: Payer: Medicaid Other

## 2023-06-27 ENCOUNTER — Ambulatory Visit: Payer: Medicaid Other

## 2023-06-30 ENCOUNTER — Ambulatory Visit: Payer: Medicaid Other

## 2023-06-30 DIAGNOSIS — R278 Other lack of coordination: Secondary | ICD-10-CM | POA: Diagnosis not present

## 2023-06-30 DIAGNOSIS — M6281 Muscle weakness (generalized): Secondary | ICD-10-CM

## 2023-06-30 DIAGNOSIS — Z7689 Persons encountering health services in other specified circumstances: Secondary | ICD-10-CM | POA: Diagnosis not present

## 2023-07-01 NOTE — Therapy (Signed)
OUTPATIENT OCCUPATIONAL THERAPY NEURO TREATMENT NOTE   Patient Name: Anna Tapia MRN: [id number] DOB:1990/12/30, 32 y.o., female Today's Date: 07/01/2023  PCP: Dr. Margarita Mail REFERRING PROVIDER: Dr. Margarita Mail   OT End of Session - 07/01/23 2039     Visit Number 25    Number of Visits 36    Date for OT Re-Evaluation 07/09/23    Authorization Type 5 additional visits after 06/30/23 through 09/02/23    Progress Note Due on Visit 10    OT Start Time 1445    OT Stop Time 1530    OT Time Calculation (min) 45 min    Activity Tolerance Patient tolerated treatment well    Behavior During Therapy [facility] for tasks assessed/performed            Past Medical History:  Diagnosis Date   Arteriovenous malformation of brain    a. s/p remote coiling.   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   Congenital CHF (congestive heart failure) (HCC)    a. 03/2023 Echo: EF 60-65%, no rwma, nl RV fxn, RVSP 29.19mmHg. Mild MR. Mild Ao sclerosis w/o stenosis. Ao root 38mm.   COVID 2022   Hemorrhagic stroke (HCC) 2023   a. L-sided midbrain, thalamic ICH in setting of PCA/ACA aneurysm s/p embolization.   History of broken collarbone 2020   Hydrocephalus (HCC)    a. s/p VP shunt in childhood.   Precordial chest pain    a. 03/2023 MV: No ischemia or infarct.  EF greater than 65%.  No significant coronary calcification.   Sepsis secondary to UTI (HCC) 2021   Past Surgical History:  Procedure Laterality Date   VENTRICULOPERITONEAL SHUNT     Patient Active Problem List   Diagnosis Date Noted   CHF (congestive heart failure) (HCC) 03/25/2023   Left-sided nontraumatic intracerebral hemorrhage (HCC) 08/01/2022   AVM (arteriovenous malformation) brain 07/24/2022   Asthma 07/24/2022   Major depressive disorder, recurrent episode, moderate (HCC) 02/07/2022   Sepsis secondary to UTI (HCC) 05/01/2020   VP (ventriculoperitoneal) shunt status 05/01/2020    Right ovarian cyst 05/01/2020   Elevated LFTs 05/01/2020   ONSET DATE: Sept 2023;   REFERRING DIAG:  I63.9 (ICD-10-CM) - CVA (cerebral vascular accident) (HCC)   THERAPY DIAG:  Muscle weakness (generalized)  Other lack of coordination  Rationale for Evaluation and Treatment: Rehabilitation  SUBJECTIVE:  SUBJECTIVE STATEMENT: Pt reports that her R shoulder has been more sore, specifically when using the R arm to reach across to the L arm when bathing.  Pt accompanied by: self   PERTINENT HISTORY: Per Dr. Caralee Ates on 11/19/22: HPI [name] presents to establish care.  She is here with her mom.   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       PRECAUTIONS: None  WEIGHT BEARING RESTRICTIONS: No  PAIN: 06/30/2023: mild/moderate pain R shoulder when reaching R arm over to the L arm while bathing. 2/10 R forearm (tightness), 2-3/10 by end of session Are you having pain?  Yes: NPRS scale: 4-5/10 Pain location: Entire R side, arm and leg Pain description: pins and needles in the hand, arm is achy, moving the arm causes stabbing pain Aggravating factors: movement of the arm Relieving factors: rest; pt reports medication is minimally effective  FALLS: Has patient fallen in last 6 months? Yes. 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  Being independent."  OBJECTIVE:   HAND DOMINANCE: Left  ADLs: Overall ADLs: mostly performed with the LUE Transfers/ambulation related to ADLs: indep/extra time Eating: cuts food 1 handed Grooming: 1 handed (L) UB Dressing: occasional help to don clothes if tired after a shower; help with straightening clothes; struggles with clothing fasteners LB Dressing: wearing slip on shoes; difficulty with clothing fasteners  Toileting: indep Bathing: modified indep; pt makes attempts to engage the R arm while bathing but reports this is difficult Tub Shower transfers: modified indep Equipment:  see above  IADLs: Shopping: pt can go shopping accompanied by a family member Light housekeeping: 1 handed Meal Prep: cooking causes fatigue; mostly L handed Community mobility: uses rollator for community mobility; uses [employer] transportation Medication management: uses weekly pill organizer; Engineer, materials: mother manages (pt states she doesn't really have any bills)  Handwriting:  N/A (pt is L hand dominant)  MOBILITY STATUS:  ambulatory without AD; pt seeing PT for LE strengthening and balance deficits  POSTURE COMMENTS:  rounded shoulders Sitting balance: Moves/returns truncal midpoint >2 inches in all planes  ACTIVITY TOLERANCE: Activity tolerance: Pain in the R side significantly limits activity tolerance.  Pt reports she can get fatigued with ADLs and IADLs.  FUNCTIONAL OUTCOME MEASURES: FOTO: 44; 48 03/26/23: 48 05/30/23: 52  UPPER EXTREMITY ROM:    Active ROM Right eval Right 03/26/23 Right 04/16/23 Right 05/30/23 Left Eval WNL  Shoulder flexion 38 (60) 88 (118) 121 (127) 125 (140)   Shoulder abduction       Shoulder adduction 42 (51) 85  (105) 98 (110) 100 (120)   Shoulder extension       Shoulder internal rotation  R thumb to posterior hip R thumb to lumbar spine R thumb to lumbar spine   Shoulder external rotation Hand to back of head (scaption and chin tuck) Hand to back of head (minimal scaption and minimal chin tuck) Hand to back of head without a chin tuck Hand to back of head without chin tuck, slight scaption   Elbow flexion WNL      Elbow extension WNL      Wrist flexion WNL      Wrist extension WNL      Wrist ulnar deviation       Wrist radial deviation       Wrist pronation WNL      Wrist supination WNL      (Blank rows = not tested)  R hand: Able to oppose each digit to thumb with extra time and able to make full composite fist  UPPER EXTREMITY MMT:     MMT Right eval Right 03/26/23 Right 04/16/23  Right 05/30/23 Left Eval 5/5  Shoulder flexion 3- 3- 3+ 4-   Shoulder abduction 3- 3- 3+ 4-   Shoulder adduction       Shoulder extension       Shoulder internal rotation 3+ 3+ 4- 4-   Shoulder external rotation 3+ 3+ 3+ 4-   Middle trapezius       Lower trapezius       Elbow flexion 4- 4- 4 4+   Elbow extension 4 4 4+ 4+   Wrist flexion 4- 4- 4 4+   Wrist extension 3+ 4- 4 4+   Wrist ulnar deviation       Wrist radial deviation       Wrist pronation       Wrist supination       (Blank rows = not tested)  HAND FUNCTION: Grip strength: Right: 4 lbs; Left: 50 lbs, Lateral pinch: Right: 0 lbs, Left: 17 lbs, and 3 point pinch: Right: 0 lbs, Left: 19 lbs Grip strength: Right: 18 lbs, Lateral pinch: Right 10 lbs, 3 point pinch: Right 6 lbs 04/16/23: Grip strength: Right: 22 lbs, lateral pinch: Right 11 lbs, 3 point pinch: Right: 6 lbs  05/30/23: Grip strength: Right: 20 lbs, lateral pinch: Right 9 lbs, 3 point pinch: Right: 6 lbs   COORDINATION: Finger Nose Finger test: able to touch nose with increased time 9 Hole Peg test: Right: unable (picked up a peg with repeat trials but unable to move the peg from  horizontal to vertical position to place into board) sec; Left: 24 sec 03/26/23: 5 min 11 sec 04/16/23: 4 min 54 sec  05/30/23: 3 min 6 sec   SENSATION: tingling in the R hand  Light touch: Impaired  Proprioception: WFL  EDEMA: N/A  MUSCLE TONE: RUE: Mild and Hypertonic  COGNITION: Overall cognitive status: Within functional limits for tasks assessed  VISION: Subjective report: Pt reports increased blurred vision following her stroke Baseline vision: Wears glasses all the time  VISION ASSESSMENT: Ocular ROM: WFL Gaze preference/alignment: gaze left; slight Tracking/Visual pursuits: Able to track stimulus in all quads without difficulty Saccades: WFL Visual Fields: no apparent deficits  PERCEPTION: WFL  PRAXIS: Impaired: Motor planning  OBSERVATIONS: Pt guards RUE with flexed elbow and shoulder IR/hand to abdomen during mobility.  R scapular elevation noting increased tension in R upper trap.  TODAY'S TREATMENT:        Moist heat to R shoulder applied for pain management/muscle relaxation used simultaneous to therapeutic exercises noted below.   Therapeutic Exercise:  Performed passive stretching throughout the RUE, including all planes for R shoulder, elbow ext, forearm pron/sup, wrist flex/ext/UD/RD, and digit flex/ext, and thumb radial add in prep for engaging the RUE into therapeutic exercises and neuro re-ed activities.   Facilitated pinch strengthening with use of therapy resistant clothespins to target lateral pinch on the R hand, removing clothespins from a horizontal and vertical dowel.  Facilitated R grip strengthening with use of hand gripper set at 6.6# to remove jumbo pegs from pegboard x2 trials.  Frequent rest breaks needed to complete both sets.    Neuro re-ed: Facilitated grasp/release and forward and lateral reaching patterns working with a 3 point pinch to remove jumbo pegs from pegboard.  Pt required multiple attempts and was able to maintain this prehension  pattern <10% of the time, as R LF more frequently slipped from position.  Pt practiced placing pegs back into pegboard using a lateral pinch.  Assisted with reps of WB  through the hand on table top for normalizing tone between activities and rest breaks.  Peg container placed at varying spots on tabletop to facilitate RUE forward and lateral reaching patterns.    PATIENT EDUCATION: Education details: Review of activities to promote reaching/grasp/release with the RUE for home program. Person educated: Patient Education method: explanation, vc, demo Education comprehension: verbalized understanding, demonstrated understanding  HOME EXERCISE PROGRAM: Soft blue theraputty exercises, blue foam block, cane stretches, WB through the RUE, RUE reaching/grasp/release activities  GOALS: Goals reviewed with patient? Yes  SHORT TERM GOALS: Target date: 05/28/23 (6 weeks from recert on 04/16/23)  Pt will be indep to perform HEP for increasing RUE flexibility, strength, and coordination to better engage the RUE into daily tasks. Baseline: Not yet initiated; 03/26/23: using putty and engaging the R arm as able into daily tasks; 04/16/23: pt continues with putty and engaging the RUE into ADLs; will progress when able; 05/30/23: Pt doing cane stretches, WB through the RUE, occasionally using putty; issued foam block per pt preference Goal status: ongoing  LONG TERM GOALS: Target date: 07/09/23 (12 weeks from recert on 04/16/23)  Pt will increase FOTO score to 48 or better to indicate clinically relevant improvement in self perceived use of R arm for daily tasks.  Baseline: Eval: 44; 03/26/23: 48; 05/30/23: 52 Goal status: achieved/ongoing  2.  Pt will increase R active shoulder flexion to 140* (revised on 05/30/23) or better to achieve functional ROM for UB ADLs and reaching for ADL supplies. Baseline: Eval: R 38 degrees (passive 60); pt reaches during ADLs only with the L arm; 02/11/80: R 88* (passive 118); 04/16/23:  R 121 degrees (passive 127), pt beginning to engage the RUE into reaching for light ADL supplies; 05/30/23: R 125 (passive 140) Goal status: ongoing  3.  Pt will increase R grip strength to 30 or more lbs to enable use of R hand to hold and carry a grocery bag in the R hand. Baseline: Eval: R grip 4 lbs, L 50 lbs; 03/26/23: R grip 18 lbs (pt still uses L hand to hold and carry light ADL supplies); 04/16/23: R grip 22 lbs; 05/30/23: R grip 20 lbs; increased pain today in R forearm and pt also reports she has gotten away from using her putty as consistently as she had been.  Encouraged pt re-focus on foam block for squeezing and pinching for continued progression of hand strength Goal status: revised  4.  Pt will report <4/10 pain throughout the RUE to enable improved tolerance with using RUE during daily tasks.  Baseline: Eval: 6-7/10 pain throughout the R side (pt predominantly uses L hand for ADLs); 03/26/23: 6/10 pain; pt demonstrating less sensitivity to touch and tolerates stretching throughout the RUE without jumping or grimacing; 04/16/23: 5-6/10 pain, consistent with report on 03/26/23 (pt has had some medication adjustments with minimal improvement, and pt is now planning Botox at [facility] on July 10); 05/30/23: 6-7/10 pain beginning of session in R forearm, 2-3/10 by end of session following heat, stretch, WB/muscle massage.  First Botox appt scheduled 06/04/23 next week. Goal status: ongoing  5.  Pt will increase R hand dexterity/FMC skills to manipulate clothing fasteners with bilat hands. Baseline: Eval: pt avoids clothing fasteners or manages with extra time using L hand only (R 9 hole: unable, L 24 sec); 03/26/23: pt uses the R hand as a stabilizer with min vc (9 hole peg test 5 min 11 sec); 04/16/23: pt can now button and zip with bilat hands;  inconsistent to tie laces (R 9 hole peg test 4 min 54 sec); 05/30/23: Extra time for clothing fasteners (9 hole 3 min 6 sec) Goal status:  ongoing  ASSESSMENT: CLINICAL IMPRESSION: Good tolerance to all therapeutic exercises and neuro re-ed activities this date.  Some increased R shoulder pain noted when reaching contralaterally with the RUE.  Pt reports she notices the pain mostly when she's using her R arm to wash the L arm.  OT encouraged pt to limit RUE reaching to within pain free ranges, and encouraged use of long handled sponge as needed to wash the LUE until pain in the R posterior shoulder improves.  Pt verbalized that she does not have a long handled sponge but she could obtain one.  Continued focus on forward and lateral reaching patterns with the RUE, with pt requiring less cuing for body mechanics and demonstrating good elbow extension with limited trunk flexion when reaching for items on table top.  Pt is steadily progressing with a lateral pinch, but 3 point pinch remains inconsistent with the R long finger slipping from prehension.  OT reviewed neuro re-ed activities for home program to focus on reaching/grasp/release activities as pt and OT discussed frequency decrease to 1x every other week to utilize the 5 remaining visits approved for this calendar year.  Pt will continue to benefit from skilled OT for neuro re-education throughout the RUE while normalizing tone to achieve normalized movement patterns and to maximize prehension and manipulation of ADL supplies, working to increase overall use of the R dominant arm for self care tasks.   PERFORMANCE DEFICITS: in functional skills including ADLs, IADLs, coordination, dexterity, sensation, tone, ROM, strength, pain, flexibility, Fine motor control, Gross motor control, mobility, balance, body mechanics, decreased knowledge of use of DME, vision, and UE functional use.  IMPAIRMENTS: are limiting patient from ADLs, IADLs, rest and sleep, work, and leisure.   CO-MORBIDITIES: has co-morbidities such as CHF, depression  that affects occupational performance. Patient will benefit  from skilled OT to address above impairments and improve overall function.  MODIFICATION OR ASSISTANCE TO COMPLETE EVALUATION: No modification of tasks or assist necessary to complete an evaluation.  OT OCCUPATIONAL PROFILE AND HISTORY: Problem focused assessment: Including review of records relating to presenting problem.  CLINICAL DECISION MAKING: Moderate - several treatment options, min-mod task modification necessary  REHAB POTENTIAL: Good  EVALUATION COMPLEXITY: Moderate    PLAN:  OT FREQUENCY: 1-2x/week (pending any visit limitations with insurance), but would benefit from 2x/week  OT DURATION: 12 weeks  PLANNED INTERVENTIONS: self care/ADL training, therapeutic exercise, therapeutic activity, neuromuscular re-education, manual therapy, passive range of motion, balance training, moist heat, cryotherapy, patient/family education, coping strategies training, and DME and/or AE instructions  RECOMMENDED OTHER SERVICES: OT recommended pt follow up with her eye doctor as she does report some increased blurred vision since her CVA  CONSULTED AND AGREED WITH PLAN OF CARE: Patient  PLAN FOR NEXT SESSION: see above  Otis Dials, OT 07/01/2023, 8:43 PM   Danelle Earthly, MS, OTR/L

## 2023-07-06 DIAGNOSIS — Z419 Encounter for procedure for purposes other than remedying health state, unspecified: Secondary | ICD-10-CM | POA: Diagnosis not present

## 2023-07-09 NOTE — Progress Notes (Unsigned)
Established Patient Office Visit  Subjective    Patient ID: Anna Tapia, female    DOB: [birth year]  Age: 32 y.o. MRN: [id number]  CC:  No chief complaint on file.   HPI Anna Tapia presents to follow up on chronic medical conditions.  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 Also on Lyrica 50 mg BID  -Difficulty using using right hand and right side of face - completed home PT/OT but interested in continuing with therapy outside the home.  Lipid Panel     Component Value Date/Time   CHOL 206 (H) 11/19/2022 1143   TRIG 63 11/19/2022 1143   HDL 70 11/19/2022 1143   CHOLHDL 2.9 11/19/2022 1143   LDLCALC 121 (H) 11/19/2022 1143    Hx of CHF as a newborn: -Had been following with [facility]iology at [facility], last seen in 2018 -Last echo 9/23 EF 58% -Denies chest pain, palpitations, shortness of breath or lower extremity swelling  Asthma/Allergies:  -Asthma status: better -Current Treatments: Advair, Albuterol PRN, generic Zyrtec at night as well  -Satisfied with current treatment?: yes -Dyspnea frequency: improved  -Wheezing frequency: none -Cough frequency: improved  -Nocturnal symptom frequency: none  -Limitation of activity: yes -Current upper respiratory symptoms: no -Triggers: cold air  -Pneumovax: unknown -Influenza: Not up to Date  GERD: -Currently on Pepcid 20 mg PRN  MDD: -Mood status: stable -Current treatment: Now on Celexa 10 mg, had been on Prozac 10 mg but discontinued because it was causing tremors in her hand -Satisfied with current treatment?:  difficult to tell yet -Duration of current  treatment :  days -Side effects: no Medication compliance: excellent compliance     04/22/2023   10:53 AM 03/25/2023    2:51 PM 02/24/2023    1:04 PM 02/24/2023   12:58 PM 11/19/2022   10:58 AM  Depression screen PHQ 2/9  Decreased Interest 0 2 3 3 3   Down, Depressed, Hopeless 0 2 3 3 2   PHQ - 2 Score 0 4 6 6 5   Altered sleeping 0 2 3  3   Tired, decreased energy 0 2 3  3   Change in appetite 0 0 0  1  Feeling bad or failure about yourself  0 1 3  2   Trouble concentrating 0 2 3  2   Moving slowly or fidgety/restless 0 2 3  1   Suicidal thoughts 0 0 0  1  PHQ-9 Score 0 13 21  18   Difficult doing work/chores Not difficult at all Very difficult Very difficult  Somewhat difficult   Health Maintenance: -Blood work UTD -Pap 3/23 negative but positive for HPV  Outpatient Encounter Medications as of 07/10/2023  Medication Sig   acetaminophen (TYLENOL) 325 MG tablet Take 650 mg by mouth every 6 (six) hours as needed.   albuterol (VENTOLIN HFA) 108 (90 Base) MCG/ACT inhaler Inhale 2 puffs into the lungs every 6 (six) hours as needed for wheezing or shortness of breath.   baclofen (LIORESAL) 10 MG tablet Take 5 mg in the morning, 5 mg at lunch and 10 mg at night.   citalopram (CELEXA) 10 MG tablet Take 1 tablet (10 mg  total) by mouth daily.   dibucaine (NUPERCAINAL) 1 % OINT Place 1 application  rectally 3 (three) times daily as needed for hemorrhoids.   famotidine (PEPCID) 20 MG tablet Take 1 tablet (20 mg total) by mouth 2 (two) times daily.   fluticasone-salmeterol (ADVAIR) 100-50 MCG/ACT AEPB Inhale 1 puff into the lungs 2 (two) times daily.   pregabalin (LYRICA) 50 MG capsule Take 50 mg by mouth 2 (two) times daily.   No facility-administered encounter medications on file as of 07/10/2023.    Past Medical History:  Diagnosis Date   Arteriovenous malformation of brain    a. s/p remote coiling.   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   Congenital CHF  (congestive heart failure) (HCC)    a. 03/2023 Echo: EF 60-65%, no rwma, nl RV fxn, RVSP 29.66mmHg. Mild MR. Mild Ao sclerosis w/o stenosis. Ao root 38mm.   COVID 2022   Hemorrhagic stroke (HCC) 2023   a. L-sided midbrain, thalamic ICH in setting of PCA/ACA aneurysm s/p embolization.   History of broken collarbone 2020   Hydrocephalus (HCC)    a. s/p VP shunt in childhood.   Precordial chest pain    a. 03/2023 MV: No ischemia or infarct.  EF greater than 65%.  No significant coronary calcification.   Sepsis secondary to UTI (HCC) 2021    Past Surgical History:  Procedure Laterality Date   VENTRICULOPERITONEAL SHUNT      Family History  Problem Relation Age of Onset   Seizures Mother    Hypertension Mother    Diabetes Mother    Cancer Maternal Aunt    Cancer Maternal Uncle     Social History   Socioeconomic History   Marital status: Single    Spouse name: Not on file   Number of children: Not on file   Years of education: Not on file   Highest education level: Some college, no degree  Occupational History   Not on file  Tobacco Use   Smoking status: Every Day    Current packs/day: 0.00    Types: E-cigarettes, Cigarettes    Start date: 2007    Last attempt to quit: 2021    Years since quitting: 3.6    Passive exposure: Past   Smokeless tobacco: Never  Vaping Use   Vaping status: Every Day   Substances: Nicotine, Flavoring  Substance and Sexual Activity   Alcohol use: Not Currently   Drug use: Never   Sexual activity: Not Currently    Partners: Male    Birth control/protection: Abstinence  Other Topics Concern   Not on file  Social History Narrative   Not on file   Social Determinants of Health   Financial Resource Strain: High Risk (02/20/2023)   Overall Financial Resource Strain (CARDIA)    Difficulty of Paying Living Expenses: Hard  Food Insecurity: Food Insecurity Present (02/20/2023)   Hunger Vital Sign    Worried About Running Out of Food in the Last  Year: Often true    Ran Out of Food in the Last Year: Sometimes true  Transportation Needs: No Transportation Needs (02/20/2023)   PRAPARE - Administrator, Civil Service (Medical): No    Lack of Transportation (Non-Medical): No  Physical Activity: Insufficiently Active (02/20/2023)   Exercise Vital Sign    Days of Exercise per Week: 5 days    Minutes of Exercise per Session: 10 min  Stress: Stress Concern  Present (02/20/2023)   [employer] of Occupational Health - Occupational Stress Questionnaire    Feeling of Stress : Very much  Social Connections: Socially Isolated (02/20/2023)   Social Connection and Isolation Panel [NHANES]    Frequency of Communication with Friends and Family: Never    Frequency of Social Gatherings with Friends and Family: Once a week    Attends Religious Services: Never    Database administrator or Organizations: No    Attends Engineer, structural: Not on file    Marital Status: Never married  Intimate Partner Violence: Not At Risk (03/25/2023)   Humiliation, Afraid, Rape, and Kick questionnaire    Fear of Current or Ex-Partner: No    Emotionally Abused: No    Physically Abused: No    Sexually Abused: No    Review of Systems  Constitutional:  Negative for chills and fever.  Eyes:  Negative for blurred vision.  Respiratory:  Negative for cough, shortness of breath and wheezing.   Cardiovascular:  Negative for chest pain and palpitations.  Neurological:  Positive for sensory change and focal weakness.        Objective    There were no vitals taken for this visit.  Physical Exam Constitutional:      Appearance: Normal appearance.  HENT:     Head: Normocephalic and atraumatic.     Right Ear: Ear canal and external ear normal. There is impacted cerumen.     Left Ear: Ear canal and external ear normal. There is impacted cerumen.  Eyes:     Conjunctiva/sclera: Conjunctivae normal.  Cardiovascular:     Rate and Rhythm:  Normal rate and regular rhythm.  Pulmonary:     Effort: Pulmonary effort is normal.     Breath sounds: Normal breath sounds.  Skin:    General: Skin is warm and dry.  Neurological:     Mental Status: She is alert. Mental status is at baseline.  Psychiatric:        Mood and Affect: Mood normal.        Behavior: Behavior normal.         Assessment & Plan:   1. History of CVA with residual deficit: Stable, feeling sleep when taking Baclofen, will refill but recommend trying 5 mg at noon and 10 mg at bedtime. Following with Neurology and OT. Also on Lyrica 50 mg BID.  - baclofen (LIORESAL) 10 MG tablet; Take 5 mg in the morning, 5 mg at lunch and 10 mg at night.  Dispense: 180 tablet; Refill: 1  2. Episode of recurrent major depressive disorder, unspecified depression episode severity (HCC): Prozac discontinued as it made tremors worse, now on Celexa 10 mg started a few days ago by Neurology. Will follow up here in 6 weeks to recheck.  - citalopram (CELEXA) 10 MG tablet; Take 1 tablet (10 mg total) by mouth daily.  3. Intermittent asthma without complication, unspecified asthma severity: Stable, doing well on Advair and Zyrtec.   4. Bilateral impacted cerumen: Ears cleaned today.   - Ear Lavage   No follow-ups on file.   Margarita Mail, DO

## 2023-07-10 ENCOUNTER — Encounter: Payer: Self-pay | Admitting: Internal Medicine

## 2023-07-10 ENCOUNTER — Ambulatory Visit (INDEPENDENT_AMBULATORY_CARE_PROVIDER_SITE_OTHER): Payer: Medicaid Other | Admitting: Internal Medicine

## 2023-07-10 VITALS — BP 116/74 | HR 88 | Temp 98.3°F | Resp 16 | Ht 60.0 in | Wt 171.1 lb

## 2023-07-10 DIAGNOSIS — I6930 Unspecified sequelae of cerebral infarction: Secondary | ICD-10-CM | POA: Diagnosis not present

## 2023-07-10 DIAGNOSIS — Z7689 Persons encountering health services in other specified circumstances: Secondary | ICD-10-CM | POA: Diagnosis not present

## 2023-07-10 DIAGNOSIS — F339 Major depressive disorder, recurrent, unspecified: Secondary | ICD-10-CM

## 2023-07-10 MED ORDER — BACLOFEN 10 MG PO TABS
ORAL_TABLET | ORAL | 1 refills | Status: DC
Start: 2023-07-10 — End: 2024-01-13

## 2023-07-10 MED ORDER — DULOXETINE HCL 20 MG PO CPEP
20.0000 mg | ORAL_CAPSULE | Freq: Every day | ORAL | 1 refills | Status: DC
Start: 2023-07-10 — End: 2023-08-21

## 2023-07-15 ENCOUNTER — Ambulatory Visit: Payer: Medicaid Other | Attending: Internal Medicine

## 2023-07-15 DIAGNOSIS — R278 Other lack of coordination: Secondary | ICD-10-CM | POA: Diagnosis not present

## 2023-07-15 DIAGNOSIS — M6281 Muscle weakness (generalized): Secondary | ICD-10-CM | POA: Insufficient documentation

## 2023-07-15 NOTE — Therapy (Unsigned)
OUTPATIENT OCCUPATIONAL THERAPY NEURO RECERTIFICATION NOTE   Patient Name: Anna Tapia MRN: [id number] DOB:1991/01/18, 32 y.o., female Today's Date: 07/17/2023  PCP: Dr. Margarita Mail REFERRING PROVIDER: Dr. Margarita Mail   OT End of Session - 07/17/23 1455     Visit Number 26    Number of Visits 30    Date for OT Re-Evaluation 09/02/23    Authorization Type up to 30 visits through 09/02/23    Authorization - Number of Visits 30    Progress Note Due on Visit 10    OT Start Time 1100    OT Stop Time 1145    OT Time Calculation (min) 45 min    Activity Tolerance Patient tolerated treatment well    Behavior During Therapy WFL for tasks assessed/performed            Past Medical History:  Diagnosis Date   Arteriovenous malformation of brain    a. s/p remote coiling.   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   Congenital CHF (congestive heart failure) (HCC)    a. 03/2023 Echo: EF 60-65%, no rwma, nl RV fxn, RVSP 29.55mmHg. Mild MR. Mild Ao sclerosis w/o stenosis. Ao root 38mm.   COVID 2022   Hemorrhagic stroke (HCC) 2023   a. L-sided midbrain, thalamic ICH in setting of PCA/ACA aneurysm s/p embolization.   History of broken collarbone 2020   Hydrocephalus (HCC)    a. s/p VP shunt in childhood.   Precordial chest pain    a. 03/2023 MV: No ischemia or infarct.  EF greater than 65%.  No significant coronary calcification.   Sepsis secondary to UTI (HCC) 2021   Past Surgical History:  Procedure Laterality Date   VENTRICULOPERITONEAL SHUNT     Patient Active Problem List   Diagnosis Date Noted   CHF (congestive heart failure) (HCC) 03/25/2023   Left-sided nontraumatic intracerebral hemorrhage (HCC) 08/01/2022   AVM (arteriovenous malformation) brain 07/24/2022   Asthma 07/24/2022   Major depressive disorder, recurrent episode, moderate (HCC) 02/07/2022   Sepsis secondary to UTI (HCC) 05/01/2020   VP  (ventriculoperitoneal) shunt status 05/01/2020   Right ovarian cyst 05/01/2020   Elevated LFTs 05/01/2020   ONSET DATE: Sept 2023;   REFERRING DIAG:  I63.9 (ICD-10-CM) - CVA (cerebral vascular accident) (HCC)   THERAPY DIAG:  Muscle weakness (generalized)  Other lack of coordination  Rationale for Evaluation and Treatment: Rehabilitation  SUBJECTIVE:  SUBJECTIVE STATEMENT: Pt reports she's had some medication changes this week for her depression meds.   Pt accompanied by: self   PERTINENT HISTORY: Per Dr. Caralee Ates on 11/19/22: HPI [name] presents to establish care.  She is here with her mom.   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       PRECAUTIONS: None  WEIGHT BEARING RESTRICTIONS: No  PAIN: 07/15/2023: abdominal pain 8-9/10; pt reported abdominal pain started on the way to therapy; no pain reported in the RUE this date. Are you having pain? Yes: NPRS scale: 4-5/10 Pain location:  Entire R side, arm and leg Pain description: pins and needles in the hand, arm is achy, moving the arm causes stabbing pain Aggravating factors: movement of the arm Relieving factors: rest; pt reports medication is minimally effective  FALLS: Has patient fallen in last 6 months? Yes. 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  Being independent."  OBJECTIVE:   HAND DOMINANCE: Left  ADLs: Overall ADLs: mostly performed with the LUE Transfers/ambulation related to ADLs: indep/extra time Eating: cuts food 1 handed Grooming: 1 handed (L) UB Dressing: occasional help to don clothes if tired after a shower; help with straightening clothes; struggles with clothing fasteners LB Dressing: wearing slip on shoes; difficulty with clothing fasteners  Toileting: indep Bathing: modified indep; pt makes attempts to engage the R arm while bathing but reports this is difficult Tub Shower transfers: modified indep Equipment:  see above  IADLs: Shopping: pt can go shopping accompanied by a family member Light housekeeping: 1 handed Meal Prep: cooking causes fatigue; mostly L handed Community mobility: uses rollator for community mobility; uses [employer] transportation Medication management: uses weekly pill organizer; Engineer, materials: mother manages (pt states she doesn't really have any bills)  Handwriting:  N/A (pt is L hand dominant)  MOBILITY STATUS:  ambulatory without AD; pt seeing PT for LE strengthening and balance deficits  POSTURE COMMENTS:  rounded shoulders Sitting balance: Moves/returns truncal midpoint >2 inches in all planes  ACTIVITY TOLERANCE: Activity tolerance: Pain in the R side significantly limits activity tolerance.  Pt reports she can get fatigued with ADLs and IADLs.  FUNCTIONAL OUTCOME MEASURES: FOTO: 44; 48 03/26/23: 48 05/30/23: 52 07/15/23: 51  UPPER EXTREMITY ROM:    Active ROM Left eval WNL Right eval Right 03/26/23 Right 04/16/23 Right 05/30/23 Right 07/15/23  Shoulder flexion  38 (60) 88 (118) 121 (127) 125 (140) 130 (135)  Shoulder abduction        Shoulder  adduction  42 (51) 85 (105) 98 (110) 100 (120) 120 (125)  Shoulder extension        Shoulder internal rotation   R thumb to posterior hip R thumb to lumbar spine R thumb to lumbar spine R thumb to lumbar spine  Shoulder external rotation  Hand to back of head (scaption and chin tuck) Hand to back of head (minimal scaption and minimal chin tuck) Hand to back of head without a chin tuck Hand to back of head without chin tuck, slight scaption Hand to back of head without chin tuck, slight scaption   Elbow flexion  WNL      Elbow extension  WNL      Wrist flexion  WNL      Wrist extension  WNL      Wrist ulnar deviation        Wrist radial deviation        Wrist pronation  WNL      Wrist supination  WNL      (Blank rows = not tested)  R hand: Able to oppose each  digit to thumb with extra time and able to make full composite fist  UPPER EXTREMITY MMT:     MMT Left eval 5/5 Right eval Right 03/26/23 Right 04/16/23 Right 05/30/23 Right 07/15/23  Shoulder flexion  3- 3- 3+ 4- 4-  Shoulder abduction  3- 3- 3+ 4- 4-  Shoulder adduction        Shoulder extension        Shoulder internal rotation  3+ 3+ 4- 4- 4-  Shoulder external rotation  3+ 3+ 3+ 4- 4-  Middle trapezius        Lower trapezius        Elbow flexion  4- 4- 4 4+ 4+  Elbow extension  4 4 4+ 4+ 5  Wrist flexion  4- 4- 4 4+ 4+  Wrist extension  3+ 4- 4 4+ 4+  Wrist ulnar deviation        Wrist radial deviation        Wrist pronation        Wrist supination        (Blank rows = not tested)  HAND FUNCTION: Grip strength: Right: 4 lbs; Left: 50 lbs, Lateral pinch: Right: 0 lbs, Left: 17 lbs, and 3 point pinch: Right: 0 lbs, Left: 19 lbs Grip strength: Right: 18 lbs, Lateral pinch: Right 10 lbs, 3 point pinch: Right 6 lbs 04/16/23: Grip strength: Right: 22 lbs, lateral pinch: Right 11 lbs, 3 point pinch: Right: 6 lbs  05/30/23: Grip strength: Right: 20 lbs, lateral pinch: Right 9 lbs, 3 point pinch: Right: 6 lbs  07/15/23: Grip  strength: Right: 20, Left 59 lbs; lateral pinch: Right: 7 lbs  Left: 18 lbs , 3 point pinch: Right: unable to maintain pinch (fingers slipping and trembling)  Left: 15 lbs  COORDINATION: Finger Nose Finger test: able to touch nose with increased time 9 Hole Peg test: Right: unable (picked up a peg with repeat trials but unable to move the peg from horizontal to vertical position to place into board) sec; Left: 24 sec 03/26/23: 5 min 11 sec 04/16/23: 4 min 54 sec  05/30/23: 3 min 6 sec  07/15/23: Left 29 sec, Right: 7 pegs in 4 min 49 sec   SENSATION: tingling in the R hand  Light touch: Impaired  Proprioception: WFL  EDEMA: N/A  MUSCLE TONE: RUE: Mild and Hypertonic  COGNITION: Overall cognitive status: Within functional limits for tasks assessed  VISION: Subjective report: Pt reports increased blurred vision following her stroke Baseline vision: Wears glasses all the time  VISION ASSESSMENT: Ocular ROM: WFL Gaze preference/alignment: gaze left; slight Tracking/Visual pursuits: Able to track stimulus in all quads without difficulty Saccades: WFL Visual Fields: no apparent deficits  PERCEPTION: WFL  PRAXIS: Impaired: Motor planning  OBSERVATIONS: Pt guards RUE with flexed elbow and shoulder IR/hand to abdomen during mobility.  R scapular elevation noting increased tension in R upper trap.  TODAY'S TREATMENT:        Therapeutic Exercise:  Objective measures taken and goals updated for recertification evaluation.  Neuro re-ed: Facilitated grasp/release and forward and lateral reaching patterns working with a 3 point pinch to place on and pull off wooden blocks from a velcro board.  Board position was moved to promote the forward and lateral reaching patterns. Pt required several reps of hand over hand assist to position thumb, index, and middle finger pads to rest flat on blocks to formulate 3 point pinch pattern.  Pt practiced picking up 1 block, storing up to 3  in hand, and  discarding blocks 1 at a time from palm.   PATIENT EDUCATION: Education details: Progress towards goals Person educated: Patient Education method: explanation Education comprehension: verbalized understanding  HOME EXERCISE PROGRAM: Soft blue theraputty exercises, blue foam block, cane stretches, WB through the RUE, RUE reaching/grasp/release activities  GOALS: Goals reviewed with patient? Yes  SHORT TERM GOALS: Target date: 08/05/23   Pt will be indep to perform HEP for increasing RUE flexibility, strength, and coordination to better engage the RUE into daily tasks. Baseline: Not yet initiated; 03/26/23: using putty and engaging the R arm as able into daily tasks; 04/16/23: pt continues with putty and engaging the RUE into ADLs; will progress when able; 05/30/23: Pt doing cane stretches, WB through the RUE, occasionally using putty; issued foam block per pt preference; 07/15/23: continued focus needed on hand coordination skills Goal status: ongoing  LONG TERM GOALS: Target date: 09/02/23   Pt will increase FOTO score to 48 or better to indicate clinically relevant improvement in self perceived use of R arm for daily tasks.  Baseline: Eval: 44; 03/26/23: 48; 05/30/23: 52; 07/15/23: 51 Goal status: achieved/ongoing  2.  Pt will increase R active shoulder flexion to 140* (revised on 05/30/23) or better to achieve functional ROM for UB ADLs and reaching for ADL supplies. Baseline: Eval: R 38 degrees (passive 60); pt reaches during ADLs only with the L arm; 1/61/09: R 88* (passive 118); 04/16/23: R 121 degrees (passive 127), pt beginning to engage the RUE into reaching for light ADL supplies; 05/30/23: R 125 (passive 140); 07/15/23: R 130 (passive 135) Goal status: ongoing  3.  Pt will increase R grip strength to 25 or more lbs to enable use of R hand to hold and carry a grocery bag in the R hand. Baseline: Eval: R grip 4 lbs, L 50 lbs; 03/26/23: R grip 18 lbs (pt still uses L hand to hold and carry  light ADL supplies); 04/16/23: R grip 22 lbs; 05/30/23: R grip 20 lbs; increased pain today in R forearm and pt also reports she has gotten away from using her putty as consistently as she had been.  Encouraged pt re-focus on foam block for squeezing and pinching for continued progression of hand strength; 07/15/23: R grip 20 lbs (L 59 lbs) Goal status: revised  4.  Pt will report <4/10 pain throughout the RUE to enable improved tolerance with using RUE during daily tasks.  Baseline: Eval: 6-7/10 pain throughout the R side (pt predominantly uses L hand for ADLs); 03/26/23: 6/10 pain; pt demonstrating less sensitivity to touch and tolerates stretching throughout the RUE without jumping or grimacing; 04/16/23: 5-6/10 pain, consistent with report on 03/26/23 (pt has had some medication adjustments with minimal improvement, and pt is now planning Botox at [facility] on July 10); 05/30/23: 6-7/10 pain beginning of session in R forearm, 2-3/10 by end of session following heat, stretch, WB/muscle massage.  First Botox appt scheduled 06/04/23 next week; 07/15/23: Pt reports no pain in RUE, just some occasional tightness throughout the arm. Goal status: achieved  5.  Pt will increase R hand dexterity/FMC skills to manipulate clothing fasteners with bilat hands. Baseline: Eval: pt avoids clothing fasteners or manages with extra time using L hand only (R 9 hole: unable, L 24 sec); 03/26/23: pt uses the R hand as a stabilizer with min vc (9 hole peg test 5 min 11 sec); 04/16/23: pt can now button and zip with bilat hands; inconsistent to tie laces (R 9  hole peg test 4 min 54 sec); 05/30/23: Extra time for clothing fasteners (9 hole: 3 min 6 sec); 07/15/23: Extra time for clothing fasteners and pt uses R hand minimally with fasteners (9 hole: Left 29 sec, Right: 7 pegs in 4 min 49 sec) Goal status: ongoing  ASSESSMENT:  CLINICAL IMPRESSION: Pt seen for OT recertification visit.  Considering insurance authorization, up to 4 additional  visits remain and are to be completed by 09/02/23.  Pt has had 2 weeks of no therapy d/t waiting on insurance authorization, and R hand strength and coordination measures do show a decline since last measured on 05/30/23 at 20th visit progress update.  Pt continues to present with a slight R hand tremor, initially affecting the R thumb, though now pt reports it is affecting digits 3-5.  Pt has gone through a couple of medication changes recently with her depression medication, as the tremor has been thought to be a medication side effect.  Pt was unable to completely finish the 9 hole peg test today, placing 7 of the 9 pegs in 4 min and 49 sec before pt felt she could not finish d/t frustration with errors.  Pt had been able to complete all 9 pegs over the last 3 attempts since 03/26/23, with the best completion time being 3 min 6 sec (compared to L unaffected hand completion time of 29 sec).  Pt will continue to benefit from skilled OT for neuro re-education throughout the RUE while normalizing tone to achieve normalized movement patterns and to maximize prehension and manipulation of ADL supplies, working to increase overall use of the R dominant arm for self care tasks.   PERFORMANCE DEFICITS: in functional skills including ADLs, IADLs, coordination, dexterity, sensation, tone, ROM, strength, pain, flexibility, Fine motor control, Gross motor control, mobility, balance, body mechanics, decreased knowledge of use of DME, vision, and UE functional use.  IMPAIRMENTS: are limiting patient from ADLs, IADLs, rest and sleep, work, and leisure.   CO-MORBIDITIES: has co-morbidities such as CHF, depression  that affects occupational performance. Patient will benefit from skilled OT to address above impairments and improve overall function.  MODIFICATION OR ASSISTANCE TO COMPLETE EVALUATION: No modification of tasks or assist necessary to complete an evaluation.  OT OCCUPATIONAL PROFILE AND HISTORY: Problem focused  assessment: Including review of records relating to presenting problem.  CLINICAL DECISION MAKING: Moderate - several treatment options, min-mod task modification necessary  REHAB POTENTIAL: Good  EVALUATION COMPLEXITY: Moderate    PLAN:  OT FREQUENCY: 1-2x/week (pending any visit limitations with insurance), but would benefit from 2x/week  OT DURATION: 12 weeks  PLANNED INTERVENTIONS: self care/ADL training, therapeutic exercise, therapeutic activity, neuromuscular re-education, manual therapy, passive range of motion, balance training, moist heat, cryotherapy, patient/family education, coping strategies training, and DME and/or AE instructions  RECOMMENDED OTHER SERVICES: OT recommended pt follow up with her eye doctor as she does report some increased blurred vision since her CVA  CONSULTED AND AGREED WITH PLAN OF CARE: Patient  PLAN FOR NEXT SESSION: see above  Otis Dials, OT 07/17/2023, 3:13 PM   Danelle Earthly, MS, OTR/L

## 2023-07-22 ENCOUNTER — Ambulatory Visit: Payer: Medicaid Other

## 2023-07-22 DIAGNOSIS — R278 Other lack of coordination: Secondary | ICD-10-CM | POA: Diagnosis not present

## 2023-07-22 DIAGNOSIS — M6281 Muscle weakness (generalized): Secondary | ICD-10-CM

## 2023-07-22 NOTE — Therapy (Signed)
OUTPATIENT OCCUPATIONAL THERAPY NEURO TREATMENT NOTE   Patient Name: Anna Tapia MRN: [id number] DOB:1990/12/19, 32 y.o., female Today's Date: 07/22/2023  PCP: Dr. Margarita Mail REFERRING PROVIDER: Dr. Margarita Mail   OT End of Session - 07/22/23 1135     Visit Number 27    Number of Visits 30    Date for OT Re-Evaluation 09/02/23    Authorization Type up to 30 visits through 09/02/23    Authorization - Visit Number 27    Authorization - Number of Visits 30    Progress Note Due on Visit 10    OT Start Time 1100    OT Stop Time 1145    OT Time Calculation (min) 45 min    Activity Tolerance Patient tolerated treatment well    Behavior During Therapy WFL for tasks assessed/performed            Past Medical History:  Diagnosis Date   Arteriovenous malformation of brain    a. s/p remote coiling.   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   Congenital CHF (congestive heart failure) (HCC)    a. 03/2023 Echo: EF 60-65%, no rwma, nl RV fxn, RVSP 29.78mmHg. Mild MR. Mild Ao sclerosis w/o stenosis. Ao root 38mm.   COVID 2022   Hemorrhagic stroke (HCC) 2023   a. L-sided midbrain, thalamic ICH in setting of PCA/ACA aneurysm s/p embolization.   History of broken collarbone 2020   Hydrocephalus (HCC)    a. s/p VP shunt in childhood.   Precordial chest pain    a. 03/2023 MV: No ischemia or infarct.  EF greater than 65%.  No significant coronary calcification.   Sepsis secondary to UTI (HCC) 2021   Past Surgical History:  Procedure Laterality Date   VENTRICULOPERITONEAL SHUNT     Patient Active Problem List   Diagnosis Date Noted   CHF (congestive heart failure) (HCC) 03/25/2023   Left-sided nontraumatic intracerebral hemorrhage (HCC) 08/01/2022   AVM (arteriovenous malformation) brain 07/24/2022   Asthma 07/24/2022   Major depressive disorder, recurrent episode, moderate (HCC) 02/07/2022   Sepsis secondary to UTI (HCC)  05/01/2020   VP (ventriculoperitoneal) shunt status 05/01/2020   Right ovarian cyst 05/01/2020   Elevated LFTs 05/01/2020   ONSET DATE: Sept 2023;   REFERRING DIAG:  I63.9 (ICD-10-CM) - CVA (cerebral vascular accident) (HCC)   THERAPY DIAG:  Muscle weakness (generalized)  Other lack of coordination  Rationale for Evaluation and Treatment: Rehabilitation  SUBJECTIVE:  SUBJECTIVE STATEMENT: Pt she thinks some of the medication changes that she's had recently may be affecting her stomach negatively.  Pt accompanied by: self   PERTINENT HISTORY: Per Dr. Caralee Ates on 11/19/22: HPI Anna Tapia presents to establish care.  She is here with her mom.   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       PRECAUTIONS: None  WEIGHT BEARING RESTRICTIONS: No  PAIN: No pain reported this date. Are you having pain? Yes: NPRS scale: 4-5/10 Pain location: Entire R side, arm and  leg Pain description: pins and needles in the hand, arm is achy, moving the arm causes stabbing pain Aggravating factors: movement of the arm Relieving factors: rest; pt reports medication is minimally effective  FALLS: Has patient fallen in last 6 months? Yes. 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  Being independent."  OBJECTIVE:   HAND DOMINANCE: Left  ADLs: Overall ADLs: mostly performed with the LUE Transfers/ambulation related to ADLs: indep/extra time Eating: cuts food 1 handed Grooming: 1 handed (L) UB Dressing: occasional help to don clothes if tired after a shower; help with straightening clothes; struggles with clothing fasteners LB Dressing: wearing slip on shoes; difficulty with clothing fasteners  Toileting: indep Bathing: modified indep; pt makes attempts to engage the R arm while bathing but reports this is difficult Tub Shower transfers: modified indep Equipment:  see above  IADLs: Shopping: pt can go shopping accompanied by a family member Light housekeeping: 1 handed Meal Prep: cooking causes fatigue; mostly L handed Community mobility: uses rollator for community mobility; uses [employer] transportation Medication management: uses weekly pill organizer; Engineer, materials: mother manages (pt states she doesn't really have any bills)  Handwriting:  N/A (pt is L hand dominant)  MOBILITY STATUS:  ambulatory without AD; pt seeing PT for LE strengthening and balance deficits  POSTURE COMMENTS:  rounded shoulders Sitting balance: Moves/returns truncal midpoint >2 inches in all planes  ACTIVITY TOLERANCE: Activity tolerance: Pain in the R side significantly limits activity tolerance.  Pt reports she can get fatigued with ADLs and IADLs.  FUNCTIONAL OUTCOME MEASURES: FOTO: 44; 48 03/26/23: 48 05/30/23: 52 07/15/23: 51  UPPER EXTREMITY ROM:    Active ROM Left eval WNL Right eval Right 03/26/23 Right 04/16/23 Right 05/30/23 Right 07/15/23  Shoulder flexion  38 (60) 88 (118) 121 (127) 125 (140) 130 (135)  Shoulder abduction        Shoulder adduction  42 (51) 85 (105) 98 (110) 100 (120) 120  (125)  Shoulder extension        Shoulder internal rotation   R thumb to posterior hip R thumb to lumbar spine R thumb to lumbar spine R thumb to lumbar spine  Shoulder external rotation  Hand to back of head (scaption and chin tuck) Hand to back of head (minimal scaption and minimal chin tuck) Hand to back of head without a chin tuck Hand to back of head without chin tuck, slight scaption Hand to back of head without chin tuck, slight scaption   Elbow flexion  WNL      Elbow extension  WNL      Wrist flexion  WNL      Wrist extension  WNL      Wrist ulnar deviation        Wrist radial deviation        Wrist pronation  WNL      Wrist supination  WNL      (Blank rows = not tested)  R hand: Able to oppose each digit to thumb with extra  time and able to make full composite fist  UPPER EXTREMITY MMT:     MMT Left eval 5/5 Right eval Right 03/26/23 Right 04/16/23 Right 05/30/23 Right 07/15/23  Shoulder flexion  3- 3- 3+ 4- 4-  Shoulder abduction  3- 3- 3+ 4- 4-  Shoulder adduction        Shoulder extension        Shoulder internal rotation  3+ 3+ 4- 4- 4-  Shoulder external rotation  3+ 3+ 3+ 4- 4-  Middle trapezius        Lower trapezius        Elbow flexion  4- 4- 4 4+ 4+  Elbow extension  4 4 4+ 4+ 5  Wrist flexion  4- 4- 4 4+ 4+  Wrist extension  3+ 4- 4 4+ 4+  Wrist ulnar deviation        Wrist radial deviation        Wrist pronation        Wrist supination        (Blank rows = not tested)  HAND FUNCTION: Grip strength: Right: 4 lbs; Left: 50 lbs, Lateral pinch: Right: 0 lbs, Left: 17 lbs, and 3 point pinch: Right: 0 lbs, Left: 19 lbs Grip strength: Right: 18 lbs, Lateral pinch: Right 10 lbs, 3 point pinch: Right 6 lbs 04/16/23: Grip strength: Right: 22 lbs, lateral pinch: Right 11 lbs, 3 point pinch: Right: 6 lbs  05/30/23: Grip strength: Right: 20 lbs, lateral pinch: Right 9 lbs, 3 point pinch: Right: 6 lbs  07/15/23: Grip strength: Right: 20, Left 59 lbs; lateral pinch:  Right: 7 lbs  Left: 18 lbs , 3 point pinch: Right: unable to maintain pinch (fingers slipping and trembling)  Left: 15 lbs  COORDINATION: Finger Nose Finger test: able to touch nose with increased time 9 Hole Peg test: Right: unable (picked up a peg with repeat trials but unable to move the peg from horizontal to vertical position to place into board) sec; Left: 24 sec 03/26/23: 5 min 11 sec 04/16/23: 4 min 54 sec  05/30/23: 3 min 6 sec  07/15/23: Left 29 sec, Right: 7 pegs in 4 min 49 sec   SENSATION: tingling in the R hand  Light touch: Impaired  Proprioception: WFL  EDEMA: N/A  MUSCLE TONE: RUE: Mild and Hypertonic  COGNITION: Overall cognitive status: Within functional limits for tasks assessed  VISION: Subjective report: Pt reports increased blurred vision following her stroke Baseline vision: Wears glasses all the time  VISION ASSESSMENT: Ocular ROM: WFL Gaze preference/alignment: gaze left; slight Tracking/Visual pursuits: Able to track stimulus in all quads without difficulty Saccades: WFL Visual Fields: no apparent deficits  PERCEPTION: WFL  PRAXIS: Impaired: Motor planning  OBSERVATIONS: Pt guards RUE with flexed elbow and shoulder IR/hand to abdomen during mobility.  R scapular elevation noting increased tension in R upper trap.  TODAY'S TREATMENT:        Therapeutic Exercise:  Issued [facility] exercise handout and reviewed with pt in prep for upcoming d/c.  Facilitated hand strengthening with use of hand gripper set at 6.6# to remove jumbo pegs from pegboard x2 trials using R hand.  Initial attempt at removing pegs on the 11.2# setting, but required reduction in resistance to reduce dropped pegs.  Pegboard and peg container positions were moved to various positions on table top to promote forward and lateral reaching patterns.    Neuro re-ed: Facilitated R hand dexterity skills with practice of small item storage and translatory movements.  Pt practiced picking up 2 jumbo  pegs 1 by 1, storing in hand, and moving out of the hand 1 at a time.  Progressed to smaller item with alphabet dice, with pt demonstrating ability to store up to 3 in hand.  Pt practiced moving dice 1 by 1 from palm to fingertips in prep for discarding from radial side of hand.  Pt required assist to move the 3rd dice from the palm with each trial.   Therapeutic Activity: Pt practiced RUE FMC/GMC skills maintaining a 3 point pinch pattern while picking up alphabet dice from table top and stacking dice.  Pt able to stack 2 dice consistently, and progressed to 3 and 4 dice consistently.  PATIENT EDUCATION: Education details: [facility] HEP Person educated: Patient Education method: explanation Education comprehension: verbalized understanding  HOME EXERCISE PROGRAM: Soft blue theraputty exercises, blue foam block, cane stretches, WB through the RUE, RUE reaching/grasp/release activities  GOALS: Goals reviewed with patient? Yes  SHORT TERM GOALS: Target date: 08/05/23   Pt will be indep to perform HEP for increasing RUE flexibility, strength, and coordination to better engage the RUE into daily tasks. Baseline: Not yet initiated; 03/26/23: using putty and engaging the R arm as able into daily tasks; 04/16/23: pt continues with putty and engaging the RUE into ADLs; will progress when able; 05/30/23: Pt doing cane stretches, WB through the RUE, occasionally using putty; issued foam block per pt preference; 07/15/23: continued focus needed on hand coordination skills Goal status: ongoing  LONG TERM GOALS: Target date: 09/02/23   Pt will increase FOTO score to 48 or better to indicate clinically relevant improvement in self perceived use of R arm for daily tasks.  Baseline: Eval: 44; 03/26/23: 48; 05/30/23: 52; 07/15/23: 51 Goal status: achieved/ongoing  2.  Pt will increase R active shoulder flexion to 140* (revised on 05/30/23) or better to achieve functional ROM for UB ADLs and reaching for ADL  supplies. Baseline: Eval: R 38 degrees (passive 60); pt reaches during ADLs only with the L arm; 02/26/94: R 88* (passive 118); 04/16/23: R 121 degrees (passive 127), pt beginning to engage the RUE into reaching for light ADL supplies; 05/30/23: R 125 (passive 140); 07/15/23: R 130 (passive 135) Goal status: ongoing  3.  Pt will increase R grip strength to 25 or more lbs to enable use of R hand to hold and carry a grocery bag in the R hand. Baseline: Eval: R grip 4 lbs, L 50 lbs; 03/26/23: R grip 18 lbs (pt still uses L hand to hold and carry light ADL supplies); 04/16/23: R grip 22 lbs; 05/30/23: R grip 20 lbs; increased pain today in R forearm and pt also reports she has gotten away from using her putty as consistently as she had been.  Encouraged pt re-focus on foam block for squeezing and pinching for continued progression of hand strength; 07/15/23: R grip 20 lbs (L 59 lbs) Goal status: revised  4.  Pt will report <4/10 pain throughout the RUE to enable improved tolerance with using RUE during daily tasks.  Baseline: Eval: 6-7/10 pain throughout the R side (pt predominantly uses L hand for ADLs); 03/26/23: 6/10 pain; pt demonstrating less sensitivity to touch and tolerates stretching throughout the RUE without jumping or grimacing; 04/16/23: 5-6/10 pain, consistent with report on 03/26/23 (pt has had some medication adjustments with minimal improvement, and pt is now planning B[facility] at [facility] on July 10); 05/30/23: 6-7/10 pain beginning of session in R forearm, 2-3/10 by end  of session following heat, stretch, WB/muscle massage.  First Botox appt scheduled 06/04/23 next week; 07/15/23: Pt reports no pain in RUE, just some occasional tightness throughout the arm. Goal status: achieved  5.  Pt will increase R hand dexterity/FMC skills to manipulate clothing fasteners with bilat hands. Baseline: Eval: pt avoids clothing fasteners or manages with extra time using L hand only (R 9 hole: unable, L 24 sec); 03/26/23: pt  uses the R hand as a stabilizer with min vc (9 hole peg test 5 min 11 sec); 04/16/23: pt can now button and zip with bilat hands; inconsistent to tie laces (R 9 hole peg test 4 min 54 sec); 05/30/23: Extra time for clothing fasteners (9 hole: 3 min 6 sec); 07/15/23: Extra time for clothing fasteners and pt uses R hand minimally with fasteners (9 hole: Left 29 sec, Right: 7 pegs in 4 min 49 sec) Goal status: ongoing  ASSESSMENT:  CLINICAL IMPRESSION: Pt reports concern that she may lose motivation for continuing her therapy on her own at home once she reaches her max visit number after 3 additional OT visits.  OT reinforced importance of daily practice and engagement of the RUE, and reviewed a handout to promote R hand strength and coordination skills using objects in the home.  Handout provided.  Pt seemed receptive and pleased with the ideas for working on her manipulation skills with household objects.  OT also acknowledged pt's progress since starting OT, and advised pt to continue to maximize the engagement of her RUE daily as to avoid any decline after working so hard to reach her current level of function.  Pt focused on forward and lateral reaching patterns in combination with lateral and 3 point pinching patterns to manipulate jumbo pegs and alphabet dice.  Pt is improving with her consistency to form a 3 point pinch pattern, but LF does often slip from position ~50% of the time.  Pt continues to struggle with storing more than 3 objects in hand (jumbo pegs or dice size), repositioning objects within fingertips, and moving objects out of palm on the radial side of the hand.  Pt will continue to benefit from skilled OT for neuro re-education throughout the RUE while normalizing tone to achieve normalized movement patterns and to maximize prehension and manipulation of ADL supplies, working to increase overall use of the R dominant arm for self care tasks.   PERFORMANCE DEFICITS: in functional skills  including ADLs, IADLs, coordination, dexterity, sensation, tone, ROM, strength, pain, flexibility, Fine motor control, Gross motor control, mobility, balance, body mechanics, decreased knowledge of use of DME, vision, and UE functional use.  IMPAIRMENTS: are limiting patient from ADLs, IADLs, rest and sleep, work, and leisure.   CO-MORBIDITIES: has co-morbidities such as CHF, depression  that affects occupational performance. Patient will benefit from skilled OT to address above impairments and improve overall function.  MODIFICATION OR ASSISTANCE TO COMPLETE EVALUATION: No modification of tasks or assist necessary to complete an evaluation.  OT OCCUPATIONAL PROFILE AND HISTORY: Problem focused assessment: Including review of records relating to presenting problem.  CLINICAL DECISION MAKING: Moderate - several treatment options, min-mod task modification necessary  REHAB POTENTIAL: Good  EVALUATION COMPLEXITY: Moderate    PLAN:  OT FREQUENCY: 1-2x/week (pending any visit limitations with insurance), but would benefit from 2x/week  OT DURATION: 12 weeks  PLANNED INTERVENTIONS: self care/ADL training, therapeutic exercise, therapeutic activity, neuromuscular re-education, manual therapy, passive range of motion, balance training, moist heat, cryotherapy, patient/family education, coping strategies training,  and DME and/or AE instructions  RECOMMENDED OTHER SERVICES: OT recommended pt follow up with her eye doctor as she does report some increased blurred vision since her CVA  CONSULTED AND AGREED WITH PLAN OF CARE: Patient  PLAN FOR NEXT SESSION: see above  Otis Dials, OT 07/22/2023, 1:03 PM  Danelle Earthly, MS, OTR/L

## 2023-07-29 ENCOUNTER — Ambulatory Visit: Payer: Medicaid Other

## 2023-07-29 DIAGNOSIS — Z7689 Persons encountering health services in other specified circumstances: Secondary | ICD-10-CM | POA: Diagnosis not present

## 2023-07-29 DIAGNOSIS — M6281 Muscle weakness (generalized): Secondary | ICD-10-CM

## 2023-07-29 DIAGNOSIS — R278 Other lack of coordination: Secondary | ICD-10-CM | POA: Diagnosis not present

## 2023-07-31 NOTE — Therapy (Signed)
OUTPATIENT OCCUPATIONAL THERAPY NEURO TREATMENT NOTE   Patient Name: Anna Tapia MRN: [id number] DOB:1991/02/23, 32 y.o., female Today's Date: 07/31/2023  PCP: Dr. Margarita Mail REFERRING PROVIDER: Dr. Margarita Mail   OT End of Session - 07/29/23 1100      Visit Number 28     Number of Visits 30     Date for OT Re-Evaluation 09/02/23     Authorization Type up to 30 visits through 09/02/23     Authorization - Visit Number 28     Authorization - Number of Visits 30     Progress Note Due on Visit 10     OT Start Time 1100     OT Stop Time 1145     OT Time Calculation (min) 45 min     Activity Tolerance Patient tolerated treatment well     Behavior During Therapy WFL for tasks assessed/performed      Past Medical History:  Diagnosis Date   Arteriovenous malformation of brain    a. s/p remote coiling.   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   Congenital CHF (congestive heart failure) (HCC)    a. 03/2023 Echo: EF 60-65%, no rwma, nl RV fxn, RVSP 29.34mmHg. Mild MR. Mild Ao sclerosis w/o stenosis. Ao root 38mm.   COVID 2022   Hemorrhagic stroke (HCC) 2023   a. L-sided midbrain, thalamic ICH in setting of PCA/ACA aneurysm s/p embolization.   History of broken collarbone 2020   Hydrocephalus (HCC)    a. s/p VP shunt in childhood.   Precordial chest pain    a. 03/2023 MV: No ischemia or infarct.  EF greater than 65%.  No significant coronary calcification.   Sepsis secondary to UTI (HCC) 2021   Past Surgical History:  Procedure Laterality Date   VENTRICULOPERITONEAL SHUNT     Patient Active Problem List   Diagnosis Date Noted   CHF (congestive heart failure) (HCC) 03/25/2023   Left-sided nontraumatic intracerebral hemorrhage (HCC) 08/01/2022   AVM (arteriovenous malformation) brain 07/24/2022   Asthma 07/24/2022   Major depressive disorder, recurrent episode, moderate (HCC) 02/07/2022   Sepsis secondary to UTI (HCC)  05/01/2020   VP (ventriculoperitoneal) shunt status 05/01/2020   Right ovarian cyst 05/01/2020   Elevated LFTs 05/01/2020   ONSET DATE: Sept 2023;   REFERRING DIAG:  I63.9 (ICD-10-CM) - CVA (cerebral vascular accident) (HCC)   THERAPY DIAG:  Muscle weakness (generalized)  Other lack of coordination  Rationale for Evaluation and Treatment: Rehabilitation  SUBJECTIVE:  SUBJECTIVE STATEMENT: Pt stated that since she's been getting food in her stomach before taking her new medication it has helped her abdominal pain.   Pt accompanied by: self   PERTINENT HISTORY: Per Dr. Caralee Ates on 11/19/22: HPI [name] presents to establish care.  She is here with her mom.   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       PRECAUTIONS: None  WEIGHT BEARING RESTRICTIONS: No  PAIN: No pain reported this date. Are you having pain?  Yes: NPRS scale: 4-5/10 Pain location: Entire R side, arm and leg Pain description: pins and needles in the hand, arm is achy, moving the arm causes stabbing pain Aggravating factors: movement of the arm Relieving factors: rest; pt reports medication is minimally effective  FALLS: Has patient fallen in last 6 months? Yes. 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  Being independent."  OBJECTIVE:   HAND DOMINANCE: Left  ADLs: Overall ADLs: mostly performed with the LUE Transfers/ambulation related to ADLs: indep/extra time Eating: cuts food 1 handed Grooming: 1 handed (L) UB Dressing: occasional help to don clothes if tired after a shower; help with straightening clothes; struggles with clothing fasteners LB Dressing: wearing slip on shoes; difficulty with clothing fasteners  Toileting: indep Bathing: modified indep; pt makes attempts to engage the R arm while bathing but reports this is difficult Tub Shower transfers: modified indep Equipment:  see above  IADLs: Shopping: pt can go shopping accompanied by a family member Light housekeeping: 1 handed Meal Prep: cooking causes fatigue; mostly L handed Community mobility: uses rollator for community mobility; uses [employer] transportation Medication management: uses weekly pill organizer; Engineer, materials: mother manages (pt states she doesn't really have any bills)  Handwriting:  N/A (pt is L hand dominant)  MOBILITY STATUS:  ambulatory without AD; pt seeing PT for LE strengthening and balance deficits  POSTURE COMMENTS:  rounded shoulders Sitting balance: Moves/returns truncal midpoint >2 inches in all planes  ACTIVITY TOLERANCE: Activity tolerance: Pain in the R side significantly limits activity tolerance.  Pt reports she can get fatigued with ADLs and IADLs.  FUNCTIONAL OUTCOME MEASURES: FOTO: 44; 48 03/26/23: 48 05/30/23: 52 07/15/23: 51  UPPER EXTREMITY ROM:    Active ROM Left eval WNL Right eval Right 03/26/23 Right 04/16/23 Right 05/30/23 Right 07/15/23  Shoulder flexion  38 (60) 88 (118) 121 (127) 125 (140) 130 (135)  Shoulder abduction        Shoulder adduction  42 (51) 85 (105) 98 (110)  100 (120) 120 (125)  Shoulder extension        Shoulder internal rotation   R thumb to posterior hip R thumb to lumbar spine R thumb to lumbar spine R thumb to lumbar spine  Shoulder external rotation  Hand to back of head (scaption and chin tuck) Hand to back of head (minimal scaption and minimal chin tuck) Hand to back of head without a chin tuck Hand to back of head without chin tuck, slight scaption Hand to back of head without chin tuck, slight scaption   Elbow flexion  WNL      Elbow extension  WNL      Wrist flexion  WNL      Wrist extension  WNL      Wrist ulnar deviation        Wrist radial deviation        Wrist pronation  WNL      Wrist supination  WNL      (Blank rows = not tested)  R hand: Able to oppose each digit to thumb with extra time and able to make full composite fist  UPPER EXTREMITY MMT:     MMT Left eval 5/5 Right eval Right 03/26/23 Right 04/16/23 Right 05/30/23 Right 07/15/23  Shoulder flexion  3- 3- 3+ 4- 4-  Shoulder abduction  3- 3- 3+ 4- 4-  Shoulder adduction        Shoulder extension        Shoulder internal rotation  3+ 3+ 4- 4- 4-  Shoulder external rotation  3+ 3+ 3+ 4- 4-  Middle trapezius        Lower trapezius        Elbow flexion  4- 4- 4 4+ 4+  Elbow extension  4 4 4+ 4+ 5  Wrist flexion  4- 4- 4 4+ 4+  Wrist extension  3+ 4- 4 4+ 4+  Wrist ulnar deviation        Wrist radial deviation        Wrist pronation        Wrist supination        (Blank rows = not tested)  HAND FUNCTION: Grip strength: Right: 4 lbs; Left: 50 lbs, Lateral pinch: Right: 0 lbs, Left: 17 lbs, and 3 point pinch: Right: 0 lbs, Left: 19 lbs Grip strength: Right: 18 lbs, Lateral pinch: Right 10 lbs, 3 point pinch: Right 6 lbs 04/16/23: Grip strength: Right: 22 lbs, lateral pinch: Right 11 lbs, 3 point pinch: Right: 6 lbs  05/30/23: Grip strength: Right: 20 lbs, lateral pinch: Right 9 lbs, 3 point pinch: Right: 6 lbs  07/15/23: Grip strength: Right: 20, Left 59 lbs;  lateral pinch: Right: 7 lbs  Left: 18 lbs , 3 point pinch: Right: unable to maintain pinch (fingers slipping and trembling)  Left: 15 lbs  COORDINATION: Finger Nose Finger test: able to touch nose with increased time 9 Hole Peg test: Right: unable (picked up a peg with repeat trials but unable to move the peg from horizontal to vertical position to place into board) sec; Left: 24 sec 03/26/23: 5 min 11 sec 04/16/23: 4 min 54 sec  05/30/23: 3 min 6 sec  07/15/23: Left 29 sec, Right: 7 pegs in 4 min 49 sec   SENSATION: tingling in the R hand  Light touch: Impaired  Proprioception: WFL  EDEMA: N/A  MUSCLE TONE: RUE: Mild and Hypertonic  COGNITION: Overall cognitive status: Within functional limits for tasks assessed  VISION: Subjective report: Pt reports increased blurred vision following her stroke Baseline vision: Wears glasses all the time  VISION ASSESSMENT: Ocular ROM: WFL Gaze preference/alignment: gaze left; slight Tracking/Visual pursuits: Able to track stimulus in all quads without difficulty Saccades: WFL Visual Fields: no apparent deficits  PERCEPTION: WFL  PRAXIS: Impaired: Motor planning  OBSERVATIONS: Pt guards RUE with flexed elbow and shoulder IR/hand to abdomen during mobility.  R scapular elevation noting increased tension in R upper trap.  TODAY'S TREATMENT:        Neuro re-ed: Facilitated R hand FMC/dexterity skills working to formulate a 3 point pinch to place and remove ball pegs into pegboard.  Pt practiced formulating a lumbrical grasp holding a piece of cardboard and working to resist against a slight tug.  Pt requires tactile cues for initiating and maintaining PIP extension for a true lumbrical grasp.  Reinforced pt practice with her lateral pinch with her putty, making sure to pinch with a flat thumb/neutral IP position.  Throughout session, OT provided intermittent joint compressions  at the R wrist and elbow to promote tone reduction, and intermittent WB  into the palm and passive stretching at the wrist and digits to minimize mixed tone in the wrist and hand.  PATIENT EDUCATION: Education details: lumbrical grasp, lateral pinch Person educated: Patient Education method: explanation, demo, tactile cues Education comprehension: verbalized understanding  HOME EXERCISE PROGRAM: Soft blue theraputty exercises, blue foam block, cane stretches, WB through the RUE, RUE reaching/grasp/release activities  GOALS: Goals reviewed with patient? Yes  SHORT TERM GOALS: Target date: 08/05/23   Pt will be indep to perform HEP for increasing RUE flexibility, strength, and coordination to better engage the RUE into daily tasks. Baseline: Not yet initiated; 03/26/23: using putty and engaging the R arm as able into daily tasks; 04/16/23: pt continues with putty and engaging the RUE into ADLs; will progress when able; 05/30/23: Pt doing cane stretches, WB through the RUE, occasionally using putty; issued foam block per pt preference; 07/15/23: continued focus needed on hand coordination skills Goal status: ongoing  LONG TERM GOALS: Target date: 09/02/23   Pt will increase FOTO score to 48 or better to indicate clinically relevant improvement in self perceived use of R arm for daily tasks.  Baseline: Eval: 44; 03/26/23: 48; 05/30/23: 52; 07/15/23: 51 Goal status: achieved/ongoing  2.  Pt will increase R active shoulder flexion to 140* (revised on 05/30/23) or better to achieve functional ROM for UB ADLs and reaching for ADL supplies. Baseline: Eval: R 38 degrees (passive 60); pt reaches during ADLs only with the L arm; 11/22/12: R 88* (passive 118); 04/16/23: R 121 degrees (passive 127), pt beginning to engage the RUE into reaching for light ADL supplies; 05/30/23: R 125 (passive 140); 07/15/23: R 130 (passive 135) Goal status: ongoing  3.  Pt will increase R grip strength to 25 or more lbs to enable use of R hand to hold and carry a grocery bag in the R hand. Baseline:  Eval: R grip 4 lbs, L 50 lbs; 03/26/23: R grip 18 lbs (pt still uses L hand to hold and carry light ADL supplies); 04/16/23: R grip 22 lbs; 05/30/23: R grip 20 lbs; increased pain today in R forearm and pt also reports she has gotten away from using her putty as consistently as she had been.  Encouraged pt re-focus on foam block for squeezing and pinching for continued progression of hand strength; 07/15/23: R grip 20 lbs (L 59 lbs) Goal status: revised  4.  Pt will report <4/10 pain throughout the RUE to enable improved tolerance with using RUE during daily tasks.  Baseline: Eval: 6-7/10 pain throughout the R side (pt predominantly uses L hand for ADLs); 03/26/23: 6/10 pain; pt demonstrating less sensitivity to touch and tolerates stretching throughout the RUE without jumping or grimacing; 04/16/23: 5-6/10 pain, consistent with report on 03/26/23 (pt has had some medication adjustments with minimal improvement, and pt is now planning Botox at [facility] on July 10); 05/30/23: 6-7/10 pain beginning of session in R forearm, 2-3/10 by end of session following heat, stretch, WB/muscle massage.  First Botox appt scheduled 06/04/23 next week; 07/15/23: Pt reports no pain in RUE, just some occasional tightness throughout the arm. Goal status: achieved  5.  Pt will increase R hand dexterity/FMC skills to manipulate clothing fasteners with bilat hands. Baseline: Eval: pt avoids clothing fasteners or manages with extra time using L hand only (R 9 hole: unable, L 24 sec); 03/26/23: pt uses the R hand as a stabilizer with min  vc (9 hole peg test 5 min 11 sec); 04/16/23: pt can now button and zip with bilat hands; inconsistent to tie laces (R 9 hole peg test 4 min 54 sec); 05/30/23: Extra time for clothing fasteners (9 hole: 3 min 6 sec); 07/15/23: Extra time for clothing fasteners and pt uses R hand minimally with fasteners (9 hole: Left 29 sec, Right: 7 pegs in 4 min 49 sec) Goal status: ongoing  ASSESSMENT:  CLINICAL IMPRESSION: Pt  reports abdominal pain has improved since she's been making sure to eat prior to taking her new medication.  Focus today on R hand pinch/grasp patterns with a 3 point pinch and a lumbrical grasp.  Initial 3 point pinch performed with frequent slipping of R 3rd finger from ball peg, resulting in a tip pinch between thumb and 2nd finger.  3 point pinch improved after reps of picking up a ball peg with thumb and 3rd finger only, then transitioning to thumb, 2nd, and 3rd digit with less slipping of 3rd digit.  Pt has a weak lumbrical grasp, requiring assist to initiate correct position; R thumb tends to slip from position.  Pt will continue to benefit from skilled OT for neuro re-education throughout the RUE while normalizing tone to achieve normalized movement patterns and to maximize prehension and manipulation of ADL supplies, working to increase overall use of the R dominant arm for self care tasks.   PERFORMANCE DEFICITS: in functional skills including ADLs, IADLs, coordination, dexterity, sensation, tone, ROM, strength, pain, flexibility, Fine motor control, Gross motor control, mobility, balance, body mechanics, decreased knowledge of use of DME, vision, and UE functional use.  IMPAIRMENTS: are limiting patient from ADLs, IADLs, rest and sleep, work, and leisure.   CO-MORBIDITIES: has co-morbidities such as CHF, depression  that affects occupational performance. Patient will benefit from skilled OT to address above impairments and improve overall function.  MODIFICATION OR ASSISTANCE TO COMPLETE EVALUATION: No modification of tasks or assist necessary to complete an evaluation.  OT OCCUPATIONAL PROFILE AND HISTORY: Problem focused assessment: Including review of records relating to presenting problem.  CLINICAL DECISION MAKING: Moderate - several treatment options, min-mod task modification necessary  REHAB POTENTIAL: Good  EVALUATION COMPLEXITY: Moderate    PLAN:  OT FREQUENCY: 1-2x/week  (pending any visit limitations with insurance), but would benefit from 2x/week  OT DURATION: 12 weeks  PLANNED INTERVENTIONS: self care/ADL training, therapeutic exercise, therapeutic activity, neuromuscular re-education, manual therapy, passive range of motion, balance training, moist heat, cryotherapy, patient/family education, coping strategies training, and DME and/or AE instructions  RECOMMENDED OTHER SERVICES: OT recommended pt follow up with her eye doctor as she does report some increased blurred vision since her CVA  CONSULTED AND AGREED WITH PLAN OF CARE: Patient  PLAN FOR NEXT SESSION: see above  Otis Dials, OT 07/31/2023, 3:09 PM  Danelle Earthly, MS, OTR/L

## 2023-08-05 ENCOUNTER — Ambulatory Visit: Payer: Medicaid Other | Attending: Internal Medicine

## 2023-08-05 DIAGNOSIS — M6281 Muscle weakness (generalized): Secondary | ICD-10-CM | POA: Insufficient documentation

## 2023-08-05 DIAGNOSIS — R278 Other lack of coordination: Secondary | ICD-10-CM | POA: Insufficient documentation

## 2023-08-05 DIAGNOSIS — Z7689 Persons encountering health services in other specified circumstances: Secondary | ICD-10-CM | POA: Diagnosis not present

## 2023-08-05 DIAGNOSIS — Z419 Encounter for procedure for purposes other than remedying health state, unspecified: Secondary | ICD-10-CM | POA: Diagnosis not present

## 2023-08-09 NOTE — Therapy (Signed)
OUTPATIENT OCCUPATIONAL THERAPY NEURO TREATMENT NOTE   Patient Name: Anna Tapia MRN: [id number] DOB:09-26-1991, 32 y.o., female Today's Date: 08/09/2023  PCP: Dr. Margarita Mail REFERRING PROVIDER: Dr. Margarita Mail   OT End of Session - 08/09/23 1837     Visit Number 29    Number of Visits 30    Date for OT Re-Evaluation 09/02/23    Authorization Type up to 30 visits through 09/02/23    Authorization - Visit Number 29    Authorization - Number of Visits 30    Progress Note Due on Visit 10    OT Start Time 1145    OT Stop Time 1230    OT Time Calculation (min) 45 min    Activity Tolerance Patient tolerated treatment well    Behavior During Therapy WFL for tasks assessed/performed            Past Medical History:  Diagnosis Date   Arteriovenous malformation of brain    a. s/p remote coiling.   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   Congenital CHF (congestive heart failure) (HCC)    a. 03/2023 Echo: EF 60-65%, no rwma, nl RV fxn, RVSP 29.66mmHg. Mild MR. Mild Ao sclerosis w/o stenosis. Ao root 38mm.   COVID 2022   Hemorrhagic stroke (HCC) 2023   a. L-sided midbrain, thalamic ICH in setting of PCA/ACA aneurysm s/p embolization.   History of broken collarbone 2020   Hydrocephalus (HCC)    a. s/p VP shunt in childhood.   Precordial chest pain    a. 03/2023 MV: No ischemia or infarct.  EF greater than 65%.  No significant coronary calcification.   Sepsis secondary to UTI (HCC) 2021   Past Surgical History:  Procedure Laterality Date   VENTRICULOPERITONEAL SHUNT     Patient Active Problem List   Diagnosis Date Noted   CHF (congestive heart failure) (HCC) 03/25/2023   Left-sided nontraumatic intracerebral hemorrhage (HCC) 08/01/2022   AVM (arteriovenous malformation) brain 07/24/2022   Asthma 07/24/2022   Major depressive disorder, recurrent episode, moderate (HCC) 02/07/2022   Sepsis secondary to UTI (HCC)  05/01/2020   VP (ventriculoperitoneal) shunt status 05/01/2020   Right ovarian cyst 05/01/2020   Elevated LFTs 05/01/2020   ONSET DATE: Sept 2023;   REFERRING DIAG:  I63.9 (ICD-10-CM) - CVA (cerebral vascular accident) (HCC)   THERAPY DIAG:  Muscle weakness (generalized)  Other lack of coordination  Rationale for Evaluation and Treatment: Rehabilitation  SUBJECTIVE:  SUBJECTIVE STATEMENT: Pt reports doing well today and states she may go out to eat later for her aunt's birthday.  Pt accompanied by: self   PERTINENT HISTORY: Per Dr. Caralee Ates on 11/19/22: HPI Anna Tapia presents to establish care.  She is here with her mom.   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       PRECAUTIONS: None  WEIGHT BEARING RESTRICTIONS: No  PAIN: No pain reported this date. Are you having pain? Yes: NPRS scale: 4-5/10 Pain location: Entire R side, arm and  leg Pain description: pins and needles in the hand, arm is achy, moving the arm causes stabbing pain Aggravating factors: movement of the arm Relieving factors: rest; pt reports medication is minimally effective  FALLS: Has patient fallen in last 6 months? Yes. 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  Being independent."  OBJECTIVE:   HAND DOMINANCE: Left  ADLs: Overall ADLs: mostly performed with the LUE Transfers/ambulation related to ADLs: indep/extra time Eating: cuts food 1 handed Grooming: 1 handed (L) UB Dressing: occasional help to don clothes if tired after a shower; help with straightening clothes; struggles with clothing fasteners LB Dressing: wearing slip on shoes; difficulty with clothing fasteners  Toileting: indep Bathing: modified indep; pt makes attempts to engage the R arm while bathing but reports this is difficult Tub Shower transfers: modified indep Equipment:  see above  IADLs: Shopping: pt can go shopping accompanied by a family member Light housekeeping: 1 handed Meal Prep: cooking causes fatigue; mostly L handed Community mobility: uses rollator for community mobility; uses [employer] transportation Medication management: uses weekly pill organizer; Engineer, materials: mother manages (pt states she doesn't really have any bills)  Handwriting:  N/A (pt is L hand dominant)  MOBILITY STATUS:  ambulatory without AD; pt seeing PT for LE strengthening and balance deficits  POSTURE COMMENTS:  rounded shoulders Sitting balance: Moves/returns truncal midpoint >2 inches in all planes  ACTIVITY TOLERANCE: Activity tolerance: Pain in the R side significantly limits activity tolerance.  Pt reports she can get fatigued with ADLs and IADLs.  FUNCTIONAL OUTCOME MEASURES: FOTO: 44; 48 03/26/23: 48 05/30/23: 52 07/15/23: 51  UPPER EXTREMITY ROM:    Active ROM Left eval WNL Right eval Right 03/26/23 Right 04/16/23 Right 05/30/23 Right 07/15/23  Shoulder flexion  38 (60) 88 (118) 121 (127) 125 (140) 130 (135)  Shoulder abduction        Shoulder adduction  42 (51) 85 (105) 98 (110) 100 (120) 120 (125)  Shoulder extension         Shoulder internal rotation   R thumb to posterior hip R thumb to lumbar spine R thumb to lumbar spine R thumb to lumbar spine  Shoulder external rotation  Hand to back of head (scaption and chin tuck) Hand to back of head (minimal scaption and minimal chin tuck) Hand to back of head without a chin tuck Hand to back of head without chin tuck, slight scaption Hand to back of head without chin tuck, slight scaption   Elbow flexion  WNL      Elbow extension  WNL      Wrist flexion  WNL      Wrist extension  WNL      Wrist ulnar deviation        Wrist radial deviation        Wrist pronation  WNL      Wrist supination  WNL      (Blank rows = not tested)  R hand: Able to oppose each digit to thumb with extra  time and able to make full composite fist  UPPER EXTREMITY MMT:     MMT Left eval 5/5 Right eval Right 03/26/23 Right 04/16/23 Right 05/30/23 Right 07/15/23  Shoulder flexion  3- 3- 3+ 4- 4-  Shoulder abduction  3- 3- 3+ 4- 4-  Shoulder adduction        Shoulder extension        Shoulder internal rotation  3+ 3+ 4- 4- 4-  Shoulder external rotation  3+ 3+ 3+ 4- 4-  Middle trapezius        Lower trapezius        Elbow flexion  4- 4- 4 4+ 4+  Elbow extension  4 4 4+ 4+ 5  Wrist flexion  4- 4- 4 4+ 4+  Wrist extension  3+ 4- 4 4+ 4+  Wrist ulnar deviation        Wrist radial deviation        Wrist pronation        Wrist supination        (Blank rows = not tested)  HAND FUNCTION: Grip strength: Right: 4 lbs; Left: 50 lbs, Lateral pinch: Right: 0 lbs, Left: 17 lbs, and 3 point pinch: Right: 0 lbs, Left: 19 lbs Grip strength: Right: 18 lbs, Lateral pinch: Right 10 lbs, 3 point pinch: Right 6 lbs 04/16/23: Grip strength: Right: 22 lbs, lateral pinch: Right 11 lbs, 3 point pinch: Right: 6 lbs  05/30/23: Grip strength: Right: 20 lbs, lateral pinch: Right 9 lbs, 3 point pinch: Right: 6 lbs  07/15/23: Grip strength: Right: 20, Left 59 lbs; lateral pinch: Right: 7 lbs  Left: 18 lbs ,  3 point pinch: Right: unable to maintain pinch (fingers slipping and trembling)  Left: 15 lbs  COORDINATION: Finger Nose Finger test: able to touch nose with increased time 9 Hole Peg test: Right: unable (picked up a peg with repeat trials but unable to move the peg from horizontal to vertical position to place into board) sec; Left: 24 sec 03/26/23: 5 min 11 sec 04/16/23: 4 min 54 sec  05/30/23: 3 min 6 sec  07/15/23: Left 29 sec, Right: 7 pegs in 4 min 49 sec   SENSATION: tingling in the R hand  Light touch: Impaired  Proprioception: WFL  EDEMA: N/A  MUSCLE TONE: RUE: Mild and Hypertonic  COGNITION: Overall cognitive status: Within functional limits for tasks assessed  VISION: Subjective report: Pt reports increased blurred vision following her stroke Baseline vision: Wears glasses all the time  VISION ASSESSMENT: Ocular ROM: WFL Gaze preference/alignment: gaze left; slight Tracking/Visual pursuits: Able to track stimulus in all quads without difficulty Saccades: WFL Visual Fields: no apparent deficits  PERCEPTION: WFL  PRAXIS: Impaired: Motor planning  OBSERVATIONS: Pt guards RUE with flexed elbow and shoulder IR/hand to abdomen during mobility.  R scapular elevation noting increased tension in R upper trap.  TODAY'S TREATMENT:        Neuro re-ed: Darel Hong board/pegs: Faciltiated R FMC/GMC skills working to place [employer] into board, which was elevated on an incline wedge at table top level.  Pt practiced FMC/dexterity skills working to rotate pegs between dot side up/down, with vc and demo to attempt repositioning pegs within fingertips to avoid stabilizing peg on pegboard or table top.  Pt mostly placed pegs back down to re-grip and dropped many when attempting to reposition within fingertips.  Pt performed high reps of peg placement, completing a boarder of 10 pegs, then moving pegs between all 4 boarders  of square pegboard to facilitate forward and lateral reaching  patterns.    -Washers/dowel: Pt picked up 1/2"-1" washers from dish and placed over vertical dowels to target small item pick up and reaching toward a target.  Pt practiced bilat coordination skills to remove washers from dowel using L hand to stabilize dowel horizontally, while R hand practiced removing 3 washers 1 by 1, storing in hand, then using translatory skills to discard washers 1 by 1 from palm of hand.  Completed joint compressions at the wrist, passive stretching for wrist and digit flex/ext/RD/UD, and weight bearing through the R hand to normalize RUE tone during short rest periods from above noted activities.   PATIENT EDUCATION: Education details: continued progress towards goals Person educated: Patient Education method: explanation, demo, tactile cues Education comprehension: verbalized understanding  HOME EXERCISE PROGRAM: Soft blue theraputty exercises, blue foam block, cane stretches, WB through the RUE, RUE reaching/grasp/release activities  GOALS: Goals reviewed with patient? Yes  SHORT TERM GOALS: Target date: 08/05/23   Pt will be indep to perform HEP for increasing RUE flexibility, strength, and coordination to better engage the RUE into daily tasks. Baseline: Not yet initiated; 03/26/23: using putty and engaging the R arm as able into daily tasks; 04/16/23: pt continues with putty and engaging the RUE into ADLs; will progress when able; 05/30/23: Pt doing cane stretches, WB through the RUE, occasionally using putty; issued foam block per pt preference; 07/15/23: continued focus needed on hand coordination skills Goal status: ongoing  LONG TERM GOALS: Target date: 09/02/23   Pt will increase FOTO score to 48 or better to indicate clinically relevant improvement in self perceived use of R arm for daily tasks.  Baseline: Eval: 44; 03/26/23: 48; 05/30/23: 52; 07/15/23: 51 Goal status: achieved/ongoing  2.  Pt will increase R active shoulder flexion to 140* (revised on  05/30/23) or better to achieve functional ROM for UB ADLs and reaching for ADL supplies. Baseline: Eval: R 38 degrees (passive 60); pt reaches during ADLs only with the L arm; 02/11/80: R 88* (passive 118); 04/16/23: R 121 degrees (passive 127), pt beginning to engage the RUE into reaching for light ADL supplies; 05/30/23: R 125 (passive 140); 07/15/23: R 130 (passive 135) Goal status: ongoing  3.  Pt will increase R grip strength to 25 or more lbs to enable use of R hand to hold and carry a grocery bag in the R hand. Baseline: Eval: R grip 4 lbs, L 50 lbs; 03/26/23: R grip 18 lbs (pt still uses L hand to hold and carry light ADL supplies); 04/16/23: R grip 22 lbs; 05/30/23: R grip 20 lbs; increased pain today in R forearm and pt also reports she has gotten away from using her putty as consistently as she had been.  Encouraged pt re-focus on foam block for squeezing and pinching for continued progression of hand strength; 07/15/23: R grip 20 lbs (L 59 lbs) Goal status: revised  4.  Pt will report <4/10 pain throughout the RUE to enable improved tolerance with using RUE during daily tasks.  Baseline: Eval: 6-7/10 pain throughout the R side (pt predominantly uses L hand for ADLs); 03/26/23: 6/10 pain; pt demonstrating less sensitivity to touch and tolerates stretching throughout the RUE without jumping or grimacing; 04/16/23: 5-6/10 pain, consistent with report on 03/26/23 (pt has had some medication adjustments with minimal improvement, and pt is now planning Botox at [facility] on July 10); 05/30/23: 6-7/10 pain beginning of session in R forearm, 2-3/10 by  end of session following heat, stretch, WB/muscle massage.  First Botox appt scheduled 06/04/23 next week; 07/15/23: Pt reports no pain in RUE, just some occasional tightness throughout the arm. Goal status: achieved  5.  Pt will increase R hand dexterity/FMC skills to manipulate clothing fasteners with bilat hands. Baseline: Eval: pt avoids clothing fasteners or manages  with extra time using L hand only (R 9 hole: unable, L 24 sec); 03/26/23: pt uses the R hand as a stabilizer with min vc (9 hole peg test 5 min 11 sec); 04/16/23: pt can now button and zip with bilat hands; inconsistent to tie laces (R 9 hole peg test 4 min 54 sec); 05/30/23: Extra time for clothing fasteners (9 hole: 3 min 6 sec); 07/15/23: Extra time for clothing fasteners and pt uses R hand minimally with fasteners (9 hole: Left 29 sec, Right: 7 pegs in 4 min 49 sec) Goal status: ongoing  ASSESSMENT:  CLINICAL IMPRESSION: Pt reports that she made a trip to the [employer] and obtained some items to work on [facility] skills at home from handout reviewed in previous session.  Pt continues to make steady progress towards OT goals.  Forward and lateral reaching patterns are completed with better isolation for trunk, but R hand FMC/dexterity skills continue to be limited, specifically with storing multiple smaller objects in palm, when attempting to move items from palm to fingertips, or when attempting to reposition items within fingertips.  Pt tends to set items back down on table top to reposition items in hand, but can occasionally rotate a large peg with extra time using fingertips rather than rotating forearm.  Pt often verbalizes frustration with how slow she feels her progress has been, but is encouraged by OT with review of functional status at start of care compared to present.  Planning 1 additional visit to target neuro re-ed for the RUE, working to increase efficient use of RUE for daily tasks, and to complete discharge assessment.  D/c planned next visit as authorized visit limit will be reached for the calendar year.  Pt acknowledges plan and reports she will plan to focus on her HEP and follow up with additional therapy as needed in the new calendar year.   PERFORMANCE DEFICITS: in functional skills including ADLs, IADLs, coordination, dexterity, sensation, tone, ROM, strength, pain, flexibility, Fine  motor control, Gross motor control, mobility, balance, body mechanics, decreased knowledge of use of DME, vision, and UE functional use.  IMPAIRMENTS: are limiting patient from ADLs, IADLs, rest and sleep, work, and leisure.   CO-MORBIDITIES: has co-morbidities such as CHF, depression  that affects occupational performance. Patient will benefit from skilled OT to address above impairments and improve overall function.  MODIFICATION OR ASSISTANCE TO COMPLETE EVALUATION: No modification of tasks or assist necessary to complete an evaluation.  OT OCCUPATIONAL PROFILE AND HISTORY: Problem focused assessment: Including review of records relating to presenting problem.  CLINICAL DECISION MAKING: Moderate - several treatment options, min-mod task modification necessary  REHAB POTENTIAL: Good  EVALUATION COMPLEXITY: Moderate    PLAN:  OT FREQUENCY: 1-2x/week (pending any visit limitations with insurance), but would benefit from 2x/week  OT DURATION: 12 weeks  PLANNED INTERVENTIONS: self care/ADL training, therapeutic exercise, therapeutic activity, neuromuscular re-education, manual therapy, passive range of motion, balance training, moist heat, cryotherapy, patient/family education, coping strategies training, and DME and/or AE instructions  RECOMMENDED OTHER SERVICES: OT recommended pt follow up with her eye doctor as she does report some increased blurred vision since her CVA  CONSULTED AND AGREED WITH PLAN OF CARE: Patient  PLAN FOR NEXT SESSION: see above  Otis Dials, OT 08/09/2023, 6:40 PM  Danelle Earthly, MS, OTR/L

## 2023-08-12 ENCOUNTER — Ambulatory Visit: Payer: Medicaid Other

## 2023-08-12 DIAGNOSIS — Z7689 Persons encountering health services in other specified circumstances: Secondary | ICD-10-CM | POA: Diagnosis not present

## 2023-08-12 DIAGNOSIS — M6281 Muscle weakness (generalized): Secondary | ICD-10-CM | POA: Diagnosis not present

## 2023-08-12 DIAGNOSIS — R278 Other lack of coordination: Secondary | ICD-10-CM

## 2023-08-12 NOTE — Therapy (Signed)
OUTPATIENT OCCUPATIONAL THERAPY NEURO DISCHARGE NOTE   Patient Name: Anna Tapia MRN: [id number] DOB:01-Oct-1991, 32 y.o., female Today's Date: 08/12/2023  PCP: Dr. Margarita Tapia REFERRING PROVIDER: Dr. Margarita Tapia   OT End of Session - 08/12/23 1417     Visit Number 30    Number of Visits 30    Date for OT Re-Evaluation 09/02/23    Authorization Type up to 30 visits through 09/02/23    Authorization - Visit Number 30    Progress Note Due on Visit 10    OT Start Time 1439    OT Stop Time 1518    OT Time Calculation (min) 39 min    Activity Tolerance Patient tolerated treatment well    Behavior During Therapy [facility] for tasks assessed/performed            Past Medical History:  Diagnosis Date   Arteriovenous malformation of brain    a. s/p remote coiling.   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   Congenital CHF (congestive heart failure) (HCC)    a. 03/2023 Echo: EF 60-65%, no rwma, nl RV fxn, RVSP 29.38mmHg. Mild MR. Mild Ao sclerosis w/o stenosis. Ao root 38mm.   COVID 2022   Hemorrhagic stroke (HCC) 2023   a. L-sided midbrain, thalamic ICH in setting of PCA/ACA aneurysm s/p embolization.   History of broken collarbone 2020   Hydrocephalus (HCC)    a. s/p VP shunt in childhood.   Precordial chest pain    a. 03/2023 MV: No ischemia or infarct.  EF greater than 65%.  No significant coronary calcification.   Sepsis secondary to UTI (HCC) 2021   Past Surgical History:  Procedure Laterality Date   VENTRICULOPERITONEAL SHUNT     Patient Active Problem List   Diagnosis Date Noted   CHF (congestive heart failure) (HCC) 03/25/2023   Left-sided nontraumatic intracerebral hemorrhage (HCC) 08/01/2022   AVM (arteriovenous malformation) brain 07/24/2022   Asthma 07/24/2022   Major depressive disorder, recurrent episode, moderate (HCC) 02/07/2022   Sepsis secondary to UTI (HCC) 05/01/2020   VP (ventriculoperitoneal) shunt  status 05/01/2020   Right ovarian cyst 05/01/2020   Elevated LFTs 05/01/2020   ONSET DATE: Sept 2023;   REFERRING DIAG:  I63.9 (ICD-10-CM) - CVA (cerebral vascular accident) (HCC)   THERAPY DIAG:  Muscle weakness (generalized)  Other lack of coordination  Rationale for Evaluation and Treatment: Rehabilitation  SUBJECTIVE:  SUBJECTIVE STATEMENT: Pt reported that she had a dream 2 nights ago that she had another stroke, and she's having a hard time getting over that dream. Pt accompanied by: self   PERTINENT HISTORY: Per Dr. Caralee Tapia on 11/19/22: HPI Anna Tapia presents to establish care.  She is here with her mom.   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       PRECAUTIONS: None  WEIGHT BEARING RESTRICTIONS: No  PAIN: 08/12/23: 3/10 pain R shoulder, mildly achy; improved with moist heat intermittently throughout session Are you having pain? Yes: NPRS scale: 4-5/10 Pain  location: Entire R side, arm and leg Pain description: pins and needles in the hand, arm is achy, moving the arm causes stabbing pain Aggravating factors: movement of the arm Relieving factors: rest; pt reports medication is minimally effective  FALLS: Has patient fallen in last 6 months? Yes. 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  Being independent."  OBJECTIVE:   HAND DOMINANCE: Left  ADLs: Overall ADLs: mostly performed with the LUE Transfers/ambulation related to ADLs: indep/extra time Eating: cuts food 1 handed Grooming: 1 handed (L) UB Dressing: occasional help to don clothes if tired after a shower; help with straightening clothes; struggles with clothing fasteners LB Dressing: wearing slip on shoes; difficulty with clothing fasteners  Toileting: indep Bathing: modified indep; pt makes attempts to engage the R arm while bathing but reports this is difficult Tub Shower transfers: modified indep Equipment:  see above  IADLs: Shopping: pt can go shopping accompanied by a family member Light housekeeping: 1 handed Meal Prep: cooking causes fatigue; mostly L handed Community mobility: uses rollator for community mobility; uses [employer] transportation Medication management: uses weekly pill organizer; Engineer, materials: mother manages (pt states she doesn't really have any bills)  Handwriting:  N/A (pt is L hand dominant)  MOBILITY STATUS:  ambulatory without AD; pt seeing PT for LE strengthening and balance deficits  POSTURE COMMENTS:  rounded shoulders Sitting balance: Moves/returns truncal midpoint >2 inches in all planes  ACTIVITY TOLERANCE: Activity tolerance: Pain in the R side significantly limits activity tolerance.  Pt reports she can get fatigued with ADLs and IADLs.  FUNCTIONAL OUTCOME MEASURES: FOTO: 44; 48 03/26/23: 48 05/30/23: 52 07/15/23: 51` 08/12/23: 61  UPPER EXTREMITY ROM:    Active ROM Left eval WNL Right eval Right 03/26/23 Right 04/16/23 Right 05/30/23 Right 07/15/23 Right 08/12/23  Shoulder flexion  38 (60) 88 (118) 121 (127) 125 (140) 130 (135) 133 (135)  Shoulder  abduction         Shoulder adduction  42 (51) 85 (105) 98 (110) 100 (120) 120 (125) 120 (125)  Shoulder extension         Shoulder internal rotation   R thumb to posterior hip R thumb to lumbar spine R thumb to lumbar spine R thumb to lumbar spine R thumb to lumbar spine (better clearance of SI joint)  Shoulder external rotation  Hand to back of head (scaption and chin tuck) Hand to back of head (minimal scaption and minimal chin tuck) Hand to back of head without a chin tuck Hand to back of head without chin tuck, slight scaption Hand to back of head without chin tuck, slight scaption  Hand to back of head without chin tuck and good abd  Elbow flexion  WNL       Elbow extension  WNL       Wrist flexion  WNL       Wrist extension  WNL       Wrist ulnar deviation         Wrist radial deviation  Wrist pronation  WNL       Wrist supination  WNL       (Blank rows = not tested)  R hand: Able to oppose each digit to thumb with extra time and able to make full composite fist  UPPER EXTREMITY MMT:     MMT Left eval 5/5 Right eval Right 03/26/23 Right 04/16/23 Right 05/30/23 Right 07/15/23 Right 08/12/23  Shoulder flexion  3- 3- 3+ 4- 4- 4-  Shoulder abduction  3- 3- 3+ 4- 4- 4-  Shoulder adduction         Shoulder extension         Shoulder internal rotation  3+ 3+ 4- 4- 4- 4  Shoulder external rotation  3+ 3+ 3+ 4- 4- 4-  Middle trapezius         Lower trapezius         Elbow flexion  4- 4- 4 4+ 4+ 4+  Elbow extension  4 4 4+ 4+ 5 5  Wrist flexion  4- 4- 4 4+ 4+ 4+  Wrist extension  3+ 4- 4 4+ 4+ 4+  Wrist ulnar deviation         Wrist radial deviation         Wrist pronation         Wrist supination         (Blank rows = not tested)  HAND FUNCTION: Grip strength: Right: 4 lbs; Left: 50 lbs, Lateral pinch: Right: 0 lbs, Left: 17 lbs, and 3 point pinch: Right: 0 lbs, Left: 19 lbs Grip strength: Right: 18 lbs, Lateral pinch: Right 10 lbs, 3 point pinch: Right 6 lbs 04/16/23:  Grip strength: Right: 22 lbs, lateral pinch: Right 11 lbs, 3 point pinch: Right: 6 lbs  05/30/23: Grip strength: Right: 20 lbs, lateral pinch: Right 9 lbs, 3 point pinch: Right: 6 lbs  07/15/23: Grip strength: Right: 20, Left 59 lbs; lateral pinch: Right: 7 lbs  Left: 18 lbs , 3 point pinch: Right: unable to maintain pinch (fingers slipping and trembling)  Left: 15 lbs 08/12/23: Grip strength: Right: 14 lbs, Left 60 lbs; lateral pinch: Right: 6 lbs, Left: 16 lbs , 3 point pinch: Right: 5 (IP flexion; Saehan pinch gauge), Left: 19 lbs  COORDINATION: Finger Nose Finger test: able to touch nose with increased time 9 Hole Peg test: Right: unable (picked up a peg with repeat trials but unable to move the peg from horizontal to vertical position to place into board) sec; Left: 24 sec 03/26/23: 5 min 11 sec 04/16/23: 4 min 54 sec  05/30/23: 3 min 6 sec  07/15/23: Left 29 sec, Right: 7 pegs in 4 min 49 sec  08/12/23: Left 23 sec, Right: 5 min and 19 sec; 2nd trial 3 min 49 sec  SENSATION: tingling in the R hand  Light touch: Impaired  Proprioception: WFL  EDEMA: N/A  MUSCLE TONE: RUE: Mild and Hypertonic  COGNITION: Overall cognitive status: Within functional limits for tasks assessed  VISION: Subjective report: Pt reports increased blurred vision following her stroke Baseline vision: Wears glasses all the time  VISION ASSESSMENT: Ocular ROM: WFL Gaze preference/alignment: gaze left; slight Tracking/Visual pursuits: Able to track stimulus in all quads without difficulty Saccades: WFL Visual Fields: no apparent deficits  PERCEPTION: WFL  PRAXIS: Impaired: Motor planning  OBSERVATIONS: Pt guards RUE with flexed elbow and shoulder IR/hand to abdomen during mobility.  R scapular elevation noting increased tension in R upper trap.  TODAY'S TREATMENT:  08/12/23:  Therapeutic Exercise: Objective measures taken and goals updated and reviewed for progress note/discharge summary.  Provided  additional [facility] activity suggestions, including flipping deck of cards, playing solitaire, and working R hand through the [employer] (visual aid provided).  In order to show progress and provide motivation for pt, OT encouraged pt start a log of ASL letters that pt can formulate independently in the R hand, and letters which require passive assist from the L hand to position R hand digits.  Encouraged passive assist as needed with subsequent attempts at holding these positions.  Pt verbalized understanding.  PATIENT EDUCATION: Education details: HEP review; encouraged high reps of pinch practice at home, formulating 3 point pinch patterns with maintenance of thumb IP extension and PIP ext of digits 2-3, and lateral pinch with IP ext.  (Tried buddy strapping 2nd and 3rd digits together, and tried an oval 8 splint on the thumb, and IF to assess improved 3 point pinch pattern, but all ineffective) Person educated: Patient Education method: explanation, demo, tactile cues Education comprehension: verbalized understanding  HOME EXERCISE PROGRAM: Soft blue theraputty exercises, blue foam block, cane stretches, WB through the RUE, RUE reaching/grasp/release activities, functional hand exercises (handout issued)  GOALS: Goals reviewed with patient? Yes  SHORT TERM GOALS: Target date: 08/05/23   Pt will be indep to perform HEP for increasing RUE flexibility, strength, and coordination to better engage the RUE into daily tasks. Baseline: Not yet initiated; 03/26/23: using putty and engaging the R arm as able into daily tasks; 04/16/23: pt continues with putty and engaging the RUE into ADLs; will progress when able; 05/30/23: Pt doing cane stretches, WB through the RUE, occasionally using putty; issued foam block per pt preference; 07/15/23: continued focus needed on hand coordination skills Goal status: ongoing  LONG TERM GOALS: Target date: 09/02/23   Pt will increase FOTO score to 48 or better to indicate  clinically relevant improvement in self perceived use of R arm for daily tasks.  Baseline: Eval: 44; 03/26/23: 48; 05/30/23: 52; 07/15/23: 51; 08/12/23: 61 Goal status: achieved  2.  Pt will increase R active shoulder flexion to 140* (revised on 05/30/23) or better to achieve functional ROM for UB ADLs and reaching for ADL supplies. Baseline: Eval: R 38 degrees (passive 60); pt reaches during ADLs only with the L arm; 02/11/80: R 88* (passive 118); 04/16/23: R 121 degrees (passive 127), pt beginning to engage the RUE into reaching for light ADL supplies; 05/30/23: R 125 (passive 140); 07/15/23: R 130 (passive 135); 08/12/23: 133 (passive 135) Goal status: partially met  3.  Pt will increase R grip strength to 25 or more lbs to enable use of R hand to hold and carry a grocery bag in the R hand. Baseline: Eval: R grip 4 lbs, L 50 lbs; 03/26/23: R grip 18 lbs (pt still uses L hand to hold and carry light ADL supplies); 04/16/23: R grip 22 lbs; 05/30/23: R grip 20 lbs; increased pain today in R forearm and pt also reports she has gotten away from using her putty as consistently as she had been.  Encouraged pt re-focus on foam block for squeezing and pinching for continued progression of hand strength; 07/15/23: R grip 20 lbs (L 59 lbs); 08/12/23: R grip 14 lbs Goal status: improved/not met  4.  Pt will report <4/10 pain throughout the RUE to enable improved tolerance with using RUE during daily tasks.  Baseline: Eval: 6-7/10 pain throughout the R side (pt predominantly uses  L hand for ADLs); 03/26/23: 6/10 pain; pt demonstrating less sensitivity to touch and tolerates stretching throughout the RUE without jumping or grimacing; 04/16/23: 5-6/10 pain, consistent with report on 03/26/23 (pt has had some medication adjustments with minimal improvement, and pt is now planning Botox at [facility] on July 10); 05/30/23: 6-7/10 pain beginning of session in R forearm, 2-3/10 by end of session following heat, stretch, WB/muscle massage.   First Botox appt scheduled 06/04/23 next week; 07/15/23: Pt reports no pain in RUE, just some occasional tightness throughout the arm; 08/12/23: 0-3/10 pain R shoulder Goal status: achieved  5.  Pt will increase R hand dexterity/FMC skills to manipulate clothing fasteners with bilat hands. Baseline: Eval: pt avoids clothing fasteners or manages with extra time using L hand only (R 9 hole: unable, L 24 sec); 03/26/23: pt uses the R hand as a stabilizer with min vc (9 hole peg test 5 min 11 sec); 04/16/23: pt can now button and zip with bilat hands; inconsistent to tie laces (R 9 hole peg test 4 min 54 sec); 05/30/23: Extra time for clothing fasteners (9 hole: 3 min 6 sec); 07/15/23: Extra time for clothing fasteners and pt uses R hand minimally with fasteners (9 hole: Left 29 sec, Right: 7 pegs in 4 min 49 sec); 08/12/23: Extra time for clothing fasteners but pt reports she engages the R hand as a helper to the L (9 hole: Left: 23 sec, Right: 5 min and 19 sec 1st trial; 2nd trial 3 min 49 sec) Goal status: achieved  ASSESSMENT:  CLINICAL IMPRESSION: Pt has been seen by OT for 30 visits to address RUE impairment post CVA.  FOTO score has improved to 61 as compared to 44 at eval.  Overall, RUE pain, AROM, strength, and coordination have greatly improved from initial eval.  Pt remains with mild spasticity in the RUE, affecting the R hand more than proximally.  Pt will have her next round of Botox mid Nov.  Pt remains very limited in [facility] skills, specifically with using a sustained pincer and 3 point pinch pattern, as fingers tend to slip from position.  Pt also is limited by clonus in the R hand, which affects ability to efficiently manipulate items in hand without dropping.  Pt has been instructed in HEP and demonstrates indep with functional strengthening and coordination exercises with written handouts.  OT discharge at this time d/t pt having met insurance limitations for this calendar year.  Pt has been encouraged  to remain adherent to HEP to promote additional gains with RUE function, with recommendation to follow up with additional OT in the new calendar year to continue to work towards OT goals to maximize functional use of the R hand with daily tasks.  Pt in agreement with plan.    PERFORMANCE DEFICITS: in functional skills including ADLs, IADLs, coordination, dexterity, sensation, tone, ROM, strength, pain, flexibility, Fine motor control, Gross motor control, mobility, balance, body mechanics, decreased knowledge of use of DME, vision, and UE functional use.  IMPAIRMENTS: are limiting patient from ADLs, IADLs, rest and sleep, work, and leisure.   CO-MORBIDITIES: has co-morbidities such as CHF, depression  that affects occupational performance. Patient will benefit from skilled OT to address above impairments and improve overall function.  MODIFICATION OR ASSISTANCE TO COMPLETE EVALUATION: No modification of tasks or assist necessary to complete an evaluation.  OT OCCUPATIONAL PROFILE AND HISTORY: Problem focused assessment: Including review of records relating to presenting problem.  CLINICAL DECISION MAKING: Moderate - several  treatment options, min-mod task modification necessary  REHAB POTENTIAL: Good  EVALUATION COMPLEXITY: Moderate    PLAN:  OT FREQUENCY: 1-2x/week (pending any visit limitations with insurance), but would benefit from 2x/week  OT DURATION: 12 weeks  PLANNED INTERVENTIONS: self care/ADL training, therapeutic exercise, therapeutic activity, neuromuscular re-education, manual therapy, passive range of motion, balance training, moist heat, cryotherapy, patient/family education, coping strategies training, and DME and/or AE instructions  RECOMMENDED OTHER SERVICES: OT recommended pt follow up with her eye doctor as she does report some increased blurred vision since her CVA  CONSULTED AND AGREED WITH PLAN OF CARE: Patient  PLAN FOR NEXT SESSION: N/A; d/c this  visit  Otis Dials, OT 08/12/2023, 3:23 PM  Danelle Earthly, MS, OTR/L

## 2023-08-19 NOTE — Progress Notes (Unsigned)
Established Patient Office Visit  Subjective    Patient ID: Anna Tapia, female    DOB: 08/23/1991  Age: 32 y.o. MRN: [id number]  CC:  No chief complaint on file.   HPI Anna Tapia presents to follow up on chronic medical conditions.  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  Also on Lyrica 50 mg BID  -Difficulty using using right hand and right side of face - completed home PT/OT but interested in continuing with therapy outside the home.  Lipid Panel     Component Value Date/Time   CHOL 206 (H) 11/19/2022 1143   TRIG 63 11/19/2022 1143   HDL 70 11/19/2022 1143   CHOLHDL 2.9 11/19/2022 1143   LDLCALC 121 (H) 11/19/2022 1143   Hx of CHF as a newborn: -Had been following with Cardiology at [facility], last seen in 2018 -Last echo 9/23 EF 58% -Denies chest pain, palpitations, shortness of breath or lower extremity swelling  Asthma/Allergies:  -Asthma status: better -Current Treatments: Advair, Albuterol PRN, generic Zyrtec at night as well  -Satisfied with current treatment?: yes -Dyspnea frequency: improved  -Wheezing frequency: none -Cough frequency: improved  -Nocturnal symptom frequency: none  -Limitation of activity: yes -Current upper respiratory symptoms: no -Triggers: cold air  -Pneumovax: unknown -Influenza: Up to Date  GERD: -Currently on Pepcid 20 mg PRN  MDD: -Mood status: stable -Current treatment: Started on Cymbalta 20 mg at LOV -Failed Meds:Celexa 10 mg but caused tremors to be worse, had been on Prozac 10 mg but discontinued because it was causing tremors in her hand as well -Satisfied with  current treatment?: no -Duration of current treatment :  6 weeks  -Side effects: yes Medication compliance: excellent compliance     07/10/2023   12:49 PM 04/22/2023   10:53 AM 03/25/2023    2:51 PM 02/24/2023    1:04 PM 02/24/2023   12:58 PM  Depression screen PHQ 2/9  Decreased Interest 3 0 2 3 3   Down, Depressed, Hopeless 3 0 2 3 3   PHQ - 2 Score 6 0 4 6 6   Altered sleeping 3 0 2 3   Tired, decreased energy 3 0 2 3   Change in appetite 3 0 0 0   Feeling bad or failure about yourself  0 0 1 3   Trouble concentrating 0 0 2 3   Moving slowly or fidgety/restless 0 0 2 3   Suicidal thoughts 0 0 0 0   PHQ-9 Score 15 0 13 21   Difficult doing work/chores Somewhat difficult Not difficult at all Very difficult Very difficult    Health Maintenance: -Blood work UTD -Pap 3/23 negative but positive for HPV  Outpatient Encounter Medications as of 08/21/2023  Medication Sig   acetaminophen (TYLENOL) 325 MG tablet Take 650 mg by mouth every 6 (six) hours as needed.   albuterol (VENTOLIN HFA) 108 (90 Base) MCG/ACT inhaler Inhale 2 puffs into the lungs every 6 (six) hours as needed for wheezing or shortness of breath.   baclofen (LIORESAL) 10 MG tablet Take 5 mg in the morning, 10 mg at lunch and 20 mg at night.  dibucaine (NUPERCAINAL) 1 % OINT Place 1 application  rectally 3 (three) times daily as needed for hemorrhoids.   DULoxetine (CYMBALTA) 20 MG capsule Take 1 capsule (20 mg total) by mouth daily.   famotidine (PEPCID) 20 MG tablet Take 1 tablet (20 mg total) by mouth 2 (two) times daily.   fluticasone-salmeterol (ADVAIR) 100-50 MCG/ACT AEPB Inhale 1 puff into the lungs 2 (two) times daily.   pregabalin (LYRICA) 50 MG capsule Take 50 mg by mouth 2 (two) times daily.   No facility-administered encounter medications on file as of 08/21/2023.    Past Medical History:  Diagnosis Date   Arteriovenous malformation of brain    a. s/p remote coiling.   Asthma    Brain aneurysm    a. PCA  and ACA aneurysm s/p Onyx embolization.   Congenital CHF (congestive heart failure) (HCC)    a. 03/2023 Echo: EF 60-65%, no rwma, nl RV fxn, RVSP 29.65mmHg. Mild MR. Mild Ao sclerosis w/o stenosis. Ao root 38mm.   COVID 2022   Hemorrhagic stroke (HCC) 2023   a. L-sided midbrain, thalamic ICH in setting of PCA/ACA aneurysm s/p embolization.   History of broken collarbone 2020   Hydrocephalus (HCC)    a. s/p VP shunt in childhood.   Precordial chest pain    a. 03/2023 MV: No ischemia or infarct.  EF greater than 65%.  No significant coronary calcification.   Sepsis secondary to UTI (HCC) 2021    Past Surgical History:  Procedure Laterality Date   VENTRICULOPERITONEAL SHUNT      Family History  Problem Relation Age of Onset   Seizures Mother    Hypertension Mother    Diabetes Mother    Cancer Maternal Aunt    Cancer Maternal Uncle     Social History   Socioeconomic History   Marital status: Single    Spouse name: Not on file   Number of children: Not on file   Years of education: Not on file   Highest education level: Some college, no degree  Occupational History   Not on file  Tobacco Use   Smoking status: Every Day    Current packs/day: 0.00    Types: E-cigarettes, Cigarettes    Start date: 2007    Last attempt to quit: 2021    Years since quitting: 3.7    Passive exposure: Past   Smokeless tobacco: Never  Vaping Use   Vaping status: Every Day   Substances: Nicotine, Flavoring  Substance and Sexual Activity   Alcohol use: Not Currently   Drug use: Never   Sexual activity: Not Currently    Partners: Male    Birth control/protection: Abstinence  Other Topics Concern   Not on file  Social History Narrative   Not on file   Social Determinants of Health   Financial Resource Strain: High Risk (02/20/2023)   Overall Financial Resource Strain (CARDIA)    Difficulty of Paying Living Expenses: Hard  Food Insecurity: Food Insecurity Present (02/20/2023)   Hunger  Vital Sign    Worried About Running Out of Food in the Last Year: Often true    Ran Out of Food in the Last Year: Sometimes true  Transportation Needs: No Transportation Needs (02/20/2023)   PRAPARE - Administrator, Civil Service (Medical): No    Lack of Transportation (Non-Medical): No  Physical Activity: Insufficiently Active (02/20/2023)   Exercise Vital Sign    Days of Exercise per Week: 5 days    Minutes  of Exercise per Session: 10 min  Stress: Stress Concern Present (02/20/2023)   [employer] of Occupational Health - Occupational Stress Questionnaire    Feeling of Stress : Very much  Social Connections: Socially Isolated (02/20/2023)   Social Connection and Isolation Panel [NHANES]    Frequency of Communication with Friends and Family: Never    Frequency of Social Gatherings with Friends and Family: Once a week    Attends Religious Services: Never    Database administrator or Organizations: No    Attends Engineer, structural: Not on file    Marital Status: Never married  Intimate Partner Violence: Not At Risk (03/25/2023)   Humiliation, Afraid, Rape, and Kick questionnaire    Fear of Current or Ex-Partner: No    Emotionally Abused: No    Physically Abused: No    Sexually Abused: No    Review of Systems  Constitutional:  Negative for chills and fever.  Eyes:  Negative for blurred vision.  Respiratory:  Negative for cough, shortness of breath and wheezing.   Cardiovascular:  Negative for chest pain and palpitations.  Neurological:  Positive for tremors, sensory change and focal weakness.  Psychiatric/Behavioral:  Positive for depression.         Objective    There were no vitals taken for this visit.  Physical Exam Constitutional:      Appearance: Normal appearance.  HENT:     Head: Normocephalic and atraumatic.  Eyes:     Conjunctiva/sclera: Conjunctivae normal.  Cardiovascular:     Rate and Rhythm: Normal rate and regular rhythm.   Pulmonary:     Effort: Pulmonary effort is normal.     Breath sounds: Normal breath sounds.  Skin:    General: Skin is warm and dry.  Neurological:     Mental Status: She is alert. Mental status is at baseline.     Comments: Tremor in thumb and 2-4th digits in right hand  Psychiatric:        Mood and Affect: Mood normal.        Behavior: Behavior normal.         Assessment & Plan:   1. History of CVA with residual deficit: Increase Baclofen to 5 mg in the morning, 10 mg in the afternoon and 20 mg at night. Has Botox injection scheduled. Will see Neurology again next year. Discussed modifying risk factors to prevent further cardiovascular disease. BP well controlled, LDL elevated, will recheck in January. Is currently vaping, will work on quitting.   - baclofen (LIORESAL) 10 MG tablet; Take 5 mg in the morning, 10 mg at lunch and 20 mg at night.  Dispense: 180 tablet; Refill: 1  2. Episode of recurrent major depressive disorder, unspecified depression episode severity (HCC): Celexa discontinued as depression is uncontrolled and the tremor returned in her hand. Will try Cymbalta 20 mg. Follow up in 6 weeks to recheck.   - DULoxetine (CYMBALTA) 20 MG capsule; Take 1 capsule (20 mg total) by mouth daily.  Dispense: 30 capsule; Refill: 1   No follow-ups on file.   Margarita Mail, DO

## 2023-08-21 ENCOUNTER — Ambulatory Visit: Payer: Medicaid Other | Admitting: Internal Medicine

## 2023-08-21 ENCOUNTER — Encounter: Payer: Self-pay | Admitting: Internal Medicine

## 2023-08-21 VITALS — BP 120/78 | HR 78 | Temp 97.8°F | Resp 16 | Ht 60.0 in | Wt 176.1 lb

## 2023-08-21 DIAGNOSIS — F331 Major depressive disorder, recurrent, moderate: Secondary | ICD-10-CM

## 2023-08-21 DIAGNOSIS — M25562 Pain in left knee: Secondary | ICD-10-CM

## 2023-08-21 DIAGNOSIS — M25561 Pain in right knee: Secondary | ICD-10-CM

## 2023-08-21 DIAGNOSIS — Z23 Encounter for immunization: Secondary | ICD-10-CM | POA: Diagnosis not present

## 2023-08-21 DIAGNOSIS — Z7689 Persons encountering health services in other specified circumstances: Secondary | ICD-10-CM | POA: Diagnosis not present

## 2023-08-21 MED ORDER — DULOXETINE HCL 20 MG PO CPEP
40.0000 mg | ORAL_CAPSULE | Freq: Every day | ORAL | 1 refills | Status: DC
Start: 2023-08-21 — End: 2024-01-09

## 2023-08-21 NOTE — Patient Instructions (Addendum)
It was great seeing you today!  Plan discussed at today's visit: -Try knee exercises below -Increase Cymbalta to 40 mg daily -Plan for fasting labs and Pap at follow up  Follow up in: 3 months   Take care and let us know if you have any questions or concerns prior to your next visit.  Dr. Caralee Ates   Knee Exercises Ask your health care provider which exercises are safe for you. Do exercises exactly as told by your health care provider and adjust them as directed. It is normal to feel mild stretching, pulling, tightness, or discomfort as you do these exercises. Stop right away if you feel sudden pain or your pain gets worse. Do not begin these exercises until told by your health care provider. Stretching and range-of-motion exercises These exercises warm up your muscles and joints and improve the movement and flexibility of your knee. These exercises also help to relieve pain and swelling. Knee extension, prone  Lie on your abdomen (prone position) on a bed. Place your left / right knee just beyond the edge of the surface so your knee is not on the bed. You can put a towel under your left / right thigh just above your kneecap for comfort. Relax your leg muscles and allow gravity to straighten your knee (extension). You should feel a stretch behind your left / right knee. Hold this position for __________ seconds. Scoot up so your knee is supported between repetitions. Repeat __________ times. Complete this exercise __________ times a day. Knee flexion, active  Lie on your back with both legs straight. If this causes back discomfort, bend your left / right knee so your foot is flat on the floor. Slowly slide your left / right heel back toward your buttocks. Stop when you feel a gentle stretch in the front of your knee or thigh (flexion). Hold this position for __________ seconds. Slowly slide your left / right heel back to the starting position. Repeat __________ times. Complete this  exercise __________ times a day. Quadriceps stretch, prone  Lie on your abdomen on a firm surface, such as a bed or padded floor. Bend your left / right knee and hold your ankle. If you cannot reach your ankle or pant leg, loop a belt around your foot and grab the belt instead. Gently pull your heel toward your buttocks. Your knee should not slide out to the side. You should feel a stretch in the front of your thigh and knee (quadriceps). Hold this position for __________ seconds. Repeat __________ times. Complete this exercise __________ times a day. Hamstring, supine  Lie on your back (supine position). Loop a belt or towel over the ball of your left / right foot. The ball of your foot is on the walking surface, right under your toes. Straighten your left / right knee and slowly pull on the belt to raise your leg until you feel a gentle stretch behind your knee (hamstring). Do not let your knee bend while you do this. Keep your other leg flat on the floor. Hold this position for __________ seconds. Repeat __________ times. Complete this exercise __________ times a day. Strengthening exercises These exercises build strength and endurance in your knee. Endurance is the ability to use your muscles for a long time, even after they get tired. Quadriceps, isometric This exercise strengthens the muscles in front of your thigh (quadriceps) without moving your knee joint (isometric). Lie on your back with your left / right leg extended and your other  knee bent. Put a rolled towel or small pillow under your knee if told by your health care provider. Slowly tense the muscles in the front of your left / right thigh. You should see your kneecap slide up toward your hip or see increased dimpling just above the knee. This motion will push the back of the knee toward the floor. For __________ seconds, hold the muscle as tight as you can without increasing your pain. Relax the muscles slowly and  completely. Repeat __________ times. Complete this exercise __________ times a day. Straight leg raises This exercise strengthens the muscles in front of your thigh (quadriceps) and the muscles that move your hips (hip flexors). Lie on your back with your left / right leg extended and your other knee bent. Tense the muscles in the front of your left / right thigh. You should see your kneecap slide up or see increased dimpling just above the knee. Your thigh may even shake a bit. Keep these muscles tight as you raise your leg 4-6 inches (10-15 cm) off the floor. Do not let your knee bend. Hold this position for __________ seconds. Keep these muscles tense as you lower your leg. Relax your muscles slowly and completely after each repetition. Repeat __________ times. Complete this exercise __________ times a day. Hamstring, isometric  Lie on your back on a firm surface. Bend your left / right knee about __________ degrees. Dig your left / right heel into the surface as if you are trying to pull it toward your buttocks. Tighten the muscles in the back of your thighs (hamstring) to "dig" as hard as you can without increasing any pain. Hold this position for __________ seconds. Release the tension gradually and allow your muscles to relax completely for __________ seconds after each repetition. Repeat __________ times. Complete this exercise __________ times a day. Hamstring curls If told by your health care provider, do this exercise while wearing ankle weights. Begin with __________lb / kg weights. Then increase the weight by 1 lb (0.5 kg) increments. Do not wear ankle weights that are more than __________lb / kg. Lie on your abdomen with your legs straight. Bend your left / right knee as far as you can without feeling pain. Keep your hips flat against the floor. Hold this position for __________ seconds. Slowly lower your leg to the starting position. Repeat __________ times. Complete this  exercise __________ times a day. Squats This exercise strengthens the muscles in front of your thigh and knee (quadriceps). Stand in front of a table, with your feet and knees pointing straight ahead. You may rest your hands on the table for balance but not for support. Slowly bend your knees and lower your hips like you are going to sit in a chair. Keep your weight over your heels, not over your toes. Keep your lower legs upright so they are parallel with the table legs. Do not let your hips go lower than your knees. Do not bend lower than told by your health care provider. If your knee pain increases, do not bend as low. Hold the squat position for __________ seconds. Slowly push with your legs to return to standing. Do not use your hands to pull yourself to standing. Repeat __________ times. Complete this exercise __________ times a day. Wall slides This exercise strengthens the muscles in front of your thigh and knee (quadriceps). Lean your back against a smooth wall or door, and walk your feet out 18-24 inches (46-61 cm) from it. Place  your feet hip-width apart. Slowly slide down the wall or door until your knees bend __________ degrees. Keep your knees over your heels, not over your toes. Keep your knees in line with your hips. Hold this position for __________ seconds. Repeat __________ times. Complete this exercise __________ times a day. Straight leg raises, side-lying This exercise strengthens the muscles that rotate the leg at the hip and move it away from your body (hip abductors). Lie on your side with your left / right leg in the top position. Lie so your head, shoulder, knee, and hip line up. You may bend your bottom knee to help you keep your balance. Roll your hips slightly forward so your hips are stacked directly over each other and your left / right knee is facing forward. Leading with your heel, lift your top leg 4-6 inches (10-15 cm). You should feel the muscles in your  outer hip lifting. Do not let your foot drift forward. Do not let your knee roll toward the ceiling. Hold this position for __________ seconds. Slowly return your leg to the starting position. Let your muscles relax completely after each repetition. Repeat __________ times. Complete this exercise __________ times a day. Straight leg raises, prone This exercise stretches the muscles that move your hips away from the front of the pelvis (hip extensors). Lie on your abdomen on a firm surface. You can put a pillow under your hips if that is more comfortable. Tense the muscles in your buttocks and lift your left / right leg about 4-6 inches (10-15 cm). Keep your knee straight as you lift your leg. Hold this position for __________ seconds. Slowly lower your leg to the starting position. Let your leg relax completely after each repetition. Repeat __________ times. Complete this exercise __________ times a day. This information is not intended to replace advice given to you by your health care provider. Make sure you discuss any questions you have with your health care provider. Document Revised: 07/03/2021 Document Reviewed: 07/03/2021 Elsevier Patient Education  2024 [employer].

## 2023-09-05 DIAGNOSIS — Z419 Encounter for procedure for purposes other than remedying health state, unspecified: Secondary | ICD-10-CM | POA: Diagnosis not present

## 2023-09-17 DIAGNOSIS — I6930 Unspecified sequelae of cerebral infarction: Secondary | ICD-10-CM | POA: Diagnosis not present

## 2023-09-17 DIAGNOSIS — Z7689 Persons encountering health services in other specified circumstances: Secondary | ICD-10-CM | POA: Diagnosis not present

## 2023-09-17 DIAGNOSIS — I69351 Hemiplegia and hemiparesis following cerebral infarction affecting right dominant side: Secondary | ICD-10-CM | POA: Diagnosis not present

## 2023-10-05 DIAGNOSIS — Z419 Encounter for procedure for purposes other than remedying health state, unspecified: Secondary | ICD-10-CM | POA: Diagnosis not present

## 2023-10-08 ENCOUNTER — Encounter: Payer: Self-pay | Admitting: Internal Medicine

## 2023-10-09 ENCOUNTER — Other Ambulatory Visit: Payer: Self-pay | Admitting: Internal Medicine

## 2023-10-09 DIAGNOSIS — I6930 Unspecified sequelae of cerebral infarction: Secondary | ICD-10-CM

## 2023-10-19 ENCOUNTER — Other Ambulatory Visit: Payer: Self-pay | Admitting: Internal Medicine

## 2023-10-20 MED ORDER — ACETAMINOPHEN 325 MG PO TABS: 650.0000 mg | ORAL_TABLET | Freq: Four times a day (QID) | ORAL | 3 refills | Status: DC | PRN

## 2023-10-20 NOTE — Telephone Encounter (Signed)
Historical provider.  

## 2023-11-05 DIAGNOSIS — Z419 Encounter for procedure for purposes other than remedying health state, unspecified: Secondary | ICD-10-CM | POA: Diagnosis not present

## 2023-11-16 DIAGNOSIS — Z419 Encounter for procedure for purposes other than remedying health state, unspecified: Secondary | ICD-10-CM | POA: Diagnosis not present

## 2023-11-21 NOTE — Progress Notes (Unsigned)
Name: Anna Tapia   MRN: [id number]    DOB: [birth year]   Date:11/21/2023       Progress Note  Subjective  Chief Complaint  No chief complaint on file.   HPI  Patient presents for annual CPE.  Diet: Regular Exercise: 2 days 10 minutes  Last Eye Exam: will schedule Last Dental Exam: will schedule  Flowsheet Row Office Visit from 08/21/2023 in [facility]  AUDIT-C Score 0       Depression: Phq 9 is  negative    08/21/2023    1:37 PM 07/10/2023   12:49 PM 04/22/2023   10:53 AM 03/25/2023    2:51 PM 02/24/2023    1:04 PM  Depression screen PHQ 2/9  Decreased Interest 1 3 0 2 3  Down, Depressed, Hopeless 1 3 0 2 3  PHQ - 2 Score 2 6 0 4 6  Altered sleeping 3 3 0 2 3  Tired, decreased energy 3 3 0 2 3  Change in appetite 0 3 0 0 0  Feeling bad or failure about yourself  1 0 0 1 3  Trouble concentrating 3 0 0 2 3  Moving slowly or fidgety/restless 1 0 0 2 3  Suicidal thoughts 0 0 0 0 0  PHQ-9 Score 13 15 0 13 21  Difficult doing work/chores Somewhat difficult Somewhat difficult Not difficult at all Very difficult Very difficult   Hypertension: BP Readings from Last 3 Encounters:  08/21/23 120/78  07/10/23 116/74  06/11/23 138/80   Obesity: Wt Readings from Last 3 Encounters:  08/21/23 176 lb 1.6 oz (79.9 kg)  07/10/23 171 lb 1.6 oz (77.6 kg)  06/11/23 166 lb 2 oz (75.4 kg)   BMI Readings from Last 3 Encounters:  08/21/23 34.39 kg/m  07/10/23 33.42 kg/m  06/11/23 32.44 kg/m     Vaccines: reviewed with the patient.   Hep C Screening: {HM options:31805} STD testing and prevention (HIV/chl/gon/syphilis):  Intimate partner violence: negative screen  Sexual History : Menstrual History/LMP/Abnormal Bleeding:  Discussed importance of follow up if any post-menopausal bleeding: not applicable  Incontinence Symptoms: negative for symptoms   Breast cancer:  - Last Mammogram: NA - BRCA gene screening:   Osteoporosis Prevention :  Discussed high calcium and vitamin D supplementation, weight bearing exercises Bone density :not applicable   Cervical cancer screening: performing today  Skin cancer: Discussed monitoring for atypical lesions  Colorectal cancer: NA   Lung cancer:  Low Dose CT Chest recommended if Age 25-80 years, 20 pack-year currently smoking OR have quit w/in 15years. Patient does not qualify for screen   ECG:   Advanced Care Planning: A voluntary discussion about advance care planning including the explanation and discussion of advance directives.  Discussed health care proxy and Living will, and the patient was able to identify a health care proxy as Jasmynn Emmick (mother).  Patient does not have a living will and power of attorney of health care   Patient Active Problem List   Diagnosis Date Noted   CHF (congestive heart failure) (HCC) 03/25/2023   Left-sided nontraumatic intracerebral hemorrhage (HCC) 08/01/2022   AVM (arteriovenous malformation) brain 07/24/2022   Asthma 07/24/2022   Major depressive disorder, recurrent episode, moderate (HCC) 02/07/2022   Sepsis secondary to UTI (HCC) 05/01/2020   VP (ventriculoperitoneal) shunt status 05/01/2020   Right ovarian cyst 05/01/2020   Elevated LFTs 05/01/2020    Past Surgical History:  Procedure Laterality Date   VENTRICULOPERITONEAL SHUNT  Family History  Problem Relation Age of Onset   Seizures Mother    Hypertension Mother    Diabetes Mother    Cancer Maternal Aunt    Cancer Maternal Uncle     Social History   Socioeconomic History   Marital status: Single    Spouse name: Not on file   Number of children: Not on file   Years of education: Not on file   Highest education level: GED or equivalent  Occupational History   Not on file  Tobacco Use   Smoking status: Every Day    Current packs/day: 0.00    Types: E-cigarettes, Cigarettes    Start date: 2007    Last attempt to quit: 2021    Years since quitting: 4.0     Passive exposure: Past   Smokeless tobacco: Never  Vaping Use   Vaping status: Every Day   Substances: Nicotine, Flavoring  Substance and Sexual Activity   Alcohol use: Not Currently   Drug use: Never   Sexual activity: Not Currently    Partners: Male    Birth control/protection: Abstinence  Other Topics Concern   Not on file  Social History Narrative   Not on file   Social Drivers of Health   Financial Resource Strain: Medium Risk (11/21/2023)   Overall Financial Resource Strain (CARDIA)    Difficulty of Paying Living Expenses: Somewhat hard  Food Insecurity: Patient Declined (11/21/2023)   Hunger Vital Sign    Worried About Running Out of Food in the Last Year: Patient declined    Ran Out of Food in the Last Year: Patient declined  Transportation Needs: No Transportation Needs (11/21/2023)   PRAPARE - Administrator, Civil Service (Medical): No    Lack of Transportation (Non-Medical): No  Physical Activity: Inactive (11/21/2023)   Exercise Vital Sign    Days of Exercise per Week: 0 days    Minutes of Exercise per Session: 10 min  Stress: Stress Concern Present (11/21/2023)   [employer] of Occupational Health - Occupational Stress Questionnaire    Feeling of Stress : To some extent  Social Connections: Socially Isolated (11/21/2023)   Social Connection and Isolation Panel [NHANES]    Frequency of Communication with Friends and Family: Never    Frequency of Social Gatherings with Friends and Family: Once a week    Attends Religious Services: Never    Database administrator or Organizations: No    Attends Engineer, structural: Not on file    Marital Status: Never married  Intimate Partner Violence: Not At Risk (03/25/2023)   Humiliation, Afraid, Rape, and Kick questionnaire    Fear of Current or Ex-Partner: No    Emotionally Abused: No    Physically Abused: No    Sexually Abused: No     Current Outpatient Medications:    acetaminophen (TYLENOL)  325 MG tablet, Take 2 tablets (650 mg total) by mouth every 6 (six) hours as needed for moderate pain (pain score 4-6)., Disp: 90 tablet, Rfl: 3   albuterol (VENTOLIN HFA) 108 (90 Base) MCG/ACT inhaler, Inhale 2 puffs into the lungs every 6 (six) hours as needed for wheezing or shortness of breath., Disp: 8 g, Rfl: 0   baclofen (LIORESAL) 10 MG tablet, Take 5 mg in the morning, 10 mg at lunch and 20 mg at night., Disp: 180 tablet, Rfl: 1   dibucaine (NUPERCAINAL) 1 % OINT, Place 1 application  rectally 3 (three) times daily as needed for  hemorrhoids., Disp: 30 g, Rfl: 0   DULoxetine (CYMBALTA) 20 MG capsule, Take 2 capsules (40 mg total) by mouth daily., Disp: 120 capsule, Rfl: 1   famotidine (PEPCID) 20 MG tablet, Take 1 tablet (20 mg total) by mouth 2 (two) times daily., Disp: 60 tablet, Rfl: 0   fluticasone-salmeterol (ADVAIR) 100-50 MCG/ACT AEPB, Inhale 1 puff into the lungs 2 (two) times daily., Disp: 60 each, Rfl: 3   pregabalin (LYRICA) 50 MG capsule, Take 50 mg by mouth 2 (two) times daily., Disp: , Rfl:   No Known Allergies   ROS  ***  Objective  There were no vitals filed for this visit.  There is no height or weight on file to calculate BMI.  Physical Exam ***  {Show previous labs (optional):23779}  Assessment & Plan  There are no diagnoses linked to this encounter.  -USPSTF grade A and B recommendations reviewed with patient; age-appropriate recommendations, preventive care, screening tests, etc discussed and encouraged; healthy living encouraged; see AVS for patient education given to patient -Discussed importance of 150 minutes of physical activity weekly, eat two servings of fish weekly, eat one serving of tree nuts ( cashews, pistachios, pecans, almonds.Marland Kitchen) every other day, eat 6 servings of fruit/vegetables daily and drink plenty of water and avoid sweet beverages.   -Reviewed Health Maintenance: {yes WU:981191}

## 2023-11-25 ENCOUNTER — Ambulatory Visit: Payer: Medicaid Other | Admitting: Internal Medicine

## 2023-11-25 ENCOUNTER — Telehealth: Payer: Self-pay

## 2023-11-25 ENCOUNTER — Encounter: Payer: Self-pay | Admitting: Internal Medicine

## 2023-11-25 ENCOUNTER — Other Ambulatory Visit: Payer: Self-pay

## 2023-11-25 VITALS — BP 122/80 | HR 95 | Temp 98.2°F | Resp 16 | Ht 60.0 in | Wt 180.1 lb

## 2023-11-25 DIAGNOSIS — I6930 Unspecified sequelae of cerebral infarction: Secondary | ICD-10-CM | POA: Insufficient documentation

## 2023-11-25 DIAGNOSIS — N926 Irregular menstruation, unspecified: Secondary | ICD-10-CM

## 2023-11-25 DIAGNOSIS — J4520 Mild intermittent asthma, uncomplicated: Secondary | ICD-10-CM | POA: Diagnosis not present

## 2023-11-25 DIAGNOSIS — F331 Major depressive disorder, recurrent, moderate: Secondary | ICD-10-CM

## 2023-11-25 DIAGNOSIS — Z23 Encounter for immunization: Secondary | ICD-10-CM

## 2023-11-25 DIAGNOSIS — Z7689 Persons encountering health services in other specified circumstances: Secondary | ICD-10-CM | POA: Diagnosis not present

## 2023-11-25 MED ORDER — ALBUTEROL SULFATE HFA 108 (90 BASE) MCG/ACT IN AERS: 2.0000 | INHALATION_SPRAY | Freq: Four times a day (QID) | RESPIRATORY_TRACT | 2 refills | Status: DC | PRN

## 2023-11-25 MED ORDER — ALBUTEROL SULFATE HFA 108 (90 BASE) MCG/ACT IN AERS: 2.0000 | INHALATION_SPRAY | Freq: Four times a day (QID) | RESPIRATORY_TRACT | 0 refills | Status: AC | PRN

## 2023-11-25 MED ORDER — FLUTICASONE-SALMETEROL 100-50 MCG/ACT IN AEPB
1.0000 | INHALATION_SPRAY | Freq: Two times a day (BID) | RESPIRATORY_TRACT | 3 refills | Status: DC
Start: 2023-11-25 — End: 2024-03-30

## 2023-11-25 NOTE — Telephone Encounter (Signed)
rior authorization is not required for the preferred alternative brand Ventolin HFA. Is this request for (or can it be changed to) the preferred alternative?*  Isnt this what you wrote for?

## 2023-11-25 NOTE — Progress Notes (Signed)
Established Anna Office Visit  Subjective   Anna Tapia, female    DOB: [birth year]  Age: 33 y.o. MRN: [id number]  Chief Complaint  Anna presents with   Medical Management of Chronic Issues    6 month recheck    HPI  Anna Tapia presents to follow up on chronic medical conditions. Going back to PT in March.   Concerned that she may have PCOS, does have irregular periods and abnormal hair growth. Periods do come every month but difficult to know when, not on any OCP's.   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  Also on Lyrica 50 mg BID  -Difficulty using using right hand and right side of face - completed home PT/OT, insurance will not pay until March -Underwent Dsyport injection in November  Lipid Panel     Component Value Date/Time   CHOL 206 (H) 11/19/2022 1143   TRIG 63 11/19/2022 1143   HDL 70 11/19/2022 1143   CHOLHDL 2.9 11/19/2022 1143   LDLCALC 121 (H) 11/19/2022 1143    Hx of CHF as a newborn: -Had been [facility], last seen in 2018 -Last echo 9/23 EF 58% -Denies chest pain, palpitations, shortness of breath or lower extremity swelling  Asthma/Allergies:  -Asthma status: better -Current Treatments: Advair, Albuterol PRN, generic Zyrtec at night as well  -Satisfied with current treatment?: yes -Dyspnea frequency: improved  -Wheezing frequency: none -Cough frequency: improved  -Nocturnal symptom frequency: none  -Limitation of activity: yes -Current upper respiratory symptoms: no -Triggers: cold air  -Pneumovax: unknown -Influenza: Up to  Date  GERD: -Currently on Pepcid 20 mg PRN  MDD: -Mood status: stable -Current treatment:Cymbalta increased to 40 mg at LOV, doing well  -Failed Meds:Celexa 10 mg but caused tremors to be worse, had been on Prozac 10 mg but discontinued because it was causing tremors in her hand as well -Satisfied with current treatment?: yes -Duration of current treatment : months  -Side effects: yes Medication compliance: excellent compliance     11/25/2023    1:11 PM 08/21/2023    1:37 PM 07/10/2023   12:49 PM 04/22/2023   10:53 AM 03/25/2023    2:51 PM  Depression screen PHQ 2/9  Decreased Interest 1 1 3  0 2  Down, Depressed, Hopeless 1 1 3  0 2  PHQ - 2 Score 2 2 6  0 4  Altered sleeping 1 3 3  0 2  Tired, decreased energy 1 3 3  0 2  Change in appetite 0 0 3 0 0  Feeling bad or failure about yourself  0 1 0 0 1  Trouble concentrating 0 3 0 0 2  Moving slowly or fidgety/restless 0 1 0 0 2  Suicidal thoughts  0 0 0 0  PHQ-9 Score 4 13 15  0 13  Difficult doing work/chores Not difficult at all Somewhat difficult Somewhat difficult Not difficult at all Very difficult    Health Maintenance: -Blood work UTD -Pap 3/23 negative but positive for HPV  Anna Active Problem List   Diagnosis Date Noted   History of CVA with residual deficit 11/25/2023   CHF (  congestive heart failure) (HCC) 03/25/2023   Left-sided nontraumatic intracerebral hemorrhage (HCC) 08/01/2022   AVM (arteriovenous malformation) brain 07/24/2022   Asthma 07/24/2022   Major depressive disorder, recurrent episode, moderate (HCC) 02/07/2022   Sepsis secondary to UTI (HCC) 05/01/2020   VP (ventriculoperitoneal) shunt status 05/01/2020   Right ovarian cyst 05/01/2020   Elevated LFTs 05/01/2020   Past Medical History:  Diagnosis Date   Arteriovenous malformation of brain    a. s/p remote coiling.   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   Congenital CHF (congestive heart failure) (HCC)     a. 03/2023 Echo: EF 60-65%, no rwma, nl RV fxn, RVSP 29.52mmHg. Mild MR. Mild Ao sclerosis w/o stenosis. Ao root 38mm.   COVID 2022   Hemorrhagic stroke (HCC) 2023   a. L-sided midbrain, thalamic ICH in setting of PCA/ACA aneurysm s/p embolization.   History of broken collarbone 2020   Hydrocephalus (HCC)    a. s/p VP shunt in childhood.   Precordial chest pain    a. 03/2023 MV: No ischemia or infarct.  EF greater than 65%.  No significant coronary calcification.   Sepsis secondary to UTI (HCC) 2021   Past Surgical History:  Procedure Laterality Date   VENTRICULOPERITONEAL SHUNT     Social History   Tobacco Use   Smoking status: Former    Current packs/day: 0.00    Types: E-cigarettes, Cigarettes    Start date: 2007    Quit date: 2021    Years since quitting: 4.0    Passive exposure: Past   Smokeless tobacco: Never  Vaping Use   Vaping status: Former  Substance Use Topics   Alcohol use: Not Currently   Drug use: Never   Social History   Socioeconomic History   Marital status: Single    Spouse name: Not on file   Number of children: Not on file   Years of education: Not on file   Highest education level: GED or equivalent  Occupational History   Not on file  Tobacco Use   Smoking status: Former    Current packs/day: 0.00    Types: E-cigarettes, Cigarettes    Start date: 2007    Quit date: 2021    Years since quitting: 4.0    Passive exposure: Past   Smokeless tobacco: Never  Vaping Use   Vaping status: Former  Substance and Sexual Activity   Alcohol use: Not Currently   Drug use: Never   Sexual activity: Not Currently    Partners: Male    Birth control/protection: Abstinence  Other Topics Concern   Not on file  Social History Narrative   Not on file   Social Drivers of Health   Financial Resource Strain: Medium Risk (11/25/2023)   Overall Financial Resource Strain (CARDIA)    Difficulty of Paying Living Expenses: Somewhat hard  Food Insecurity:  Anna Declined (11/21/2023)   Hunger Vital Sign    Worried About Running Out of Food in the Last Year: Anna declined    Ran Out of Food in the Last Year: Anna declined  Transportation Needs: No Transportation Needs (11/21/2023)   PRAPARE - Administrator, Civil Service (Medical): No    Lack of Transportation (Non-Medical): No  Physical Activity: Inactive (11/21/2023)   Exercise Vital Sign    Days of Exercise per Week: 0 days    Minutes of Exercise per Session: 10 min  Stress: Stress Concern Present (  11/21/2023)   [employer] [employer] - Occupational Stress Questionnaire    Feeling of Stress : To some extent  Social Connections: Socially Isolated (11/21/2023)   Social Connection and Isolation Panel [NHANES]    Frequency of Communication with Friends and Family: Never    Frequency of Social Gatherings with Friends and Family: Once a week    Attends Religious Services: Never    Database administrator or Organizations: No    Attends Engineer, structural: Not on file    Marital Status: Never married  Intimate Partner Violence: Not At Risk (11/25/2023)   Humiliation, Afraid, Rape, and Kick questionnaire    Fear of Current or Ex-Partner: No    Emotionally Abused: No    Physically Abused: No    Sexually Abused: No   Family Status  Relation Name Status   Mother  Alive   Father  Alive   Mat Aunt  Alive   Mat Uncle  Deceased  No partnership data on file   Family History  Problem Relation Age of Onset   Seizures Mother    Hypertension Mother    Diabetes Mother    Cancer Maternal Aunt    Cancer Maternal Uncle    No Known Allergies    ROS    Objective:     BP 122/80 (Cuff Size: Large)   Pulse 95   Temp 98.2 F (36.8 C) (Oral)   Resp 16   Ht 5' (1.524 m)   Wt 180 lb 1.6 oz (81.7 kg)   LMP 11/16/2023   SpO2 100%   BMI 35.17 kg/m  BP Readings from Last 3 Encounters:  11/25/23 122/80  08/21/23 120/78  07/10/23 116/74   Wt  Readings from Last 3 Encounters:  11/25/23 180 lb 1.6 oz (81.7 kg)  08/21/23 176 lb 1.6 oz (79.9 kg)  07/10/23 171 lb 1.6 oz (77.6 kg)      Physical Exam   No results found for any visits on 11/25/23.  Last CBC Lab Results  Component Value Date   WBC 6.1 11/19/2022   HGB 13.0 11/19/2022   HCT 39.7 11/19/2022   MCV 87.8 11/19/2022   MCH 28.8 11/19/2022   RDW 11.7 11/19/2022   PLT 415 (H) 11/19/2022   Last metabolic panel Lab Results  Component Value Date   GLUCOSE 76 11/19/2022   NA 140 11/19/2022   K 4.5 11/19/2022   CL 105 11/19/2022   CO2 26 11/19/2022   BUN 7 11/19/2022   CREATININE 0.71 11/19/2022   EGFR 116 11/19/2022   CALCIUM 9.3 11/19/2022   PROT 7.5 11/19/2022   ALBUMIN 4.6 07/24/2022   BILITOT 0.3 11/19/2022   ALKPHOS 53 07/24/2022   AST 17 11/19/2022   ALT 13 11/19/2022   ANIONGAP 8 07/24/2022   Last lipids Lab Results  Component Value Date   CHOL 206 (H) 11/19/2022   HDL 70 11/19/2022   LDLCALC 121 (H) 11/19/2022   TRIG 63 11/19/2022   CHOLHDL 2.9 11/19/2022   Last hemoglobin A1c No results found for: "HGBA1C" Last thyroid functions No results found for: "TSH", "T3TOTAL", "T4TOTAL", "THYROIDAB" Last vitamin D No results found for: "25OHVITD2", "25OHVITD3", "VD25OH" Last vitamin B12 and Folate No results found for: "VITAMINB12", "FOLATE"    The ASCVD Risk score (Arnett DK, et al., 2019) failed to calculate for the following reasons:   The 2019 ASCVD risk score is only valid for ages 60 to 32    Assessment & Plan:  Intermittent asthma without complication, unspecified asthma severity Assessment & Plan: Stable, refill inhalers.   Orders: -     Fluticasone-Salmeterol; Inhale 1 puff into the lungs 2 (two) times daily.  Dispense: 60 each; Refill: 3 -     Albuterol Sulfate HFA; Inhale 2 puffs into the lungs every 6 (six) hours as needed for wheezing or shortness of breath.  Dispense: 8 g; Refill: 0 -     Pneumococcal conjugate vaccine  20-valent  History of CVA with residual deficit Assessment & Plan: Following with Neurology, going back to PT/OT in March. No changes made to medications today.    Major depressive disorder, recurrent episode, moderate (HCC) Assessment & Plan: Moods stable on Cymbalta, continue 40 mg daily.    Irregular periods  Vaccine for streptococcus pneumoniae and influenza -     Pneumococcal conjugate vaccine 20-valent  Periods do not sound like they are truly irregular, as she does have a period every month. Will plan to do FSH/LH and testosterone labs with Physical in May.  Prevnar 20 administered today.    Return in about 4 months (around 03/24/2024) for physical w/Pap; has to be 5/21 or later .    Margarita Mail, DO

## 2023-11-25 NOTE — Assessment & Plan Note (Signed)
Stable, refill inhalers.

## 2023-11-25 NOTE — Addendum Note (Signed)
Addended by: Davene Costain on: 11/25/2023 02:47 PM   Modules accepted: Orders

## 2023-11-25 NOTE — Assessment & Plan Note (Signed)
Following with Neurology, going back to PT/OT in March. No changes made to medications today.

## 2023-11-25 NOTE — Assessment & Plan Note (Signed)
Moods stable on Cymbalta, continue 40 mg daily.

## 2023-12-04 ENCOUNTER — Other Ambulatory Visit: Payer: Self-pay | Admitting: Internal Medicine

## 2023-12-04 ENCOUNTER — Encounter: Payer: Self-pay | Admitting: Internal Medicine

## 2023-12-05 ENCOUNTER — Other Ambulatory Visit: Payer: Self-pay | Admitting: Internal Medicine

## 2023-12-06 DIAGNOSIS — Z419 Encounter for procedure for purposes other than remedying health state, unspecified: Secondary | ICD-10-CM | POA: Diagnosis not present

## 2023-12-29 ENCOUNTER — Ambulatory Visit: Payer: Self-pay | Admitting: Internal Medicine

## 2023-12-29 NOTE — Telephone Encounter (Signed)
 Copied from CRM [id number]. Topic: Clinical - Red Word Triage >> Dec 29, 2023  3:41 PM Shon Hale wrote: Red Word that prompted transfer to Nurse Triage:  Pt needing appointment & requesting to be tested for flu. Pt has cough, headache and some SOB w/ cough.   Chief Complaint: influenza Exposure with Symptoms Symptoms: Cough, Headache Frequency: Acute Pertinent Negatives: Patient denies fever, dyspnea, or chest pain. Disposition: [] ED /[] Urgent Care (no appt availability in office) / [] Appointment(In office/virtual)/ []  [place] [facility]/ [] Home Care/ [] Refused Recommended Disposition /[] [place] [facility]/ []  Follow-up with PCP Additional Notes: [facility]H is being triaged for symptoms and inquirng about getting an influenza test. The patient is experiencing a cough and a headache after frequent exposure to her aunt who is infected with the influenza and was recently diagnosed. Made in office appointment for Wednesday due to lack in office availability. Patient verbalized understanding and agreed to disposition.   Reason for Disposition  [1] Influenza EXPOSURE (Close Contact) within last 48 hours (2 days) AND [2] exposed person is HIGH RISK (e.g., age > 64 years, pregnant, HIV+, chronic medical condition)  Answer Assessment - Initial Assessment Questions 1. TYPE of EXPOSURE: "How were you exposed?" (e.g., close contact, not a close contact)     Lives with infected persons 2. DATE of EXPOSURE: "When did the exposure occur?" (e.g., hour, days, weeks)     Since onset of symptoms  3. PREGNANCY: "Is there any chance you are pregnant?" "When was your last menstrual period?"     12-18-2023  4. HIGH RISK for COMPLICATIONS: "Do you have any heart or lung problems?" "Do you have a weakened immune system?" (e.g., CHF, COPD, asthma, HIV positive, chemotherapy, renal failure, diabetes mellitus, sickle cell anemia)     Asthma, CHF  5. SYMPTOMS: "Do you have any symptoms?" (e.g., cough, fever, sore throat,  difficulty breathing).     Cough and Headache  Protocols used: Influenza (Flu) Exposure-A-AH

## 2023-12-31 ENCOUNTER — Encounter: Payer: Self-pay | Admitting: Internal Medicine

## 2023-12-31 ENCOUNTER — Ambulatory Visit (INDEPENDENT_AMBULATORY_CARE_PROVIDER_SITE_OTHER): Payer: Medicaid Other | Admitting: Internal Medicine

## 2023-12-31 ENCOUNTER — Other Ambulatory Visit: Payer: Self-pay

## 2023-12-31 VITALS — BP 122/78 | HR 94 | Temp 98.3°F | Resp 18 | Ht 60.0 in | Wt 177.6 lb

## 2023-12-31 DIAGNOSIS — Z20828 Contact with and (suspected) exposure to other viral communicable diseases: Secondary | ICD-10-CM

## 2023-12-31 LAB — POCT INFLUENZA A/B
Influenza A, POC: NEGATIVE
Influenza B, POC: NEGATIVE

## 2023-12-31 NOTE — Progress Notes (Signed)
   Acute Office Visit  Subjective:     Patient ID: Anna Tapia, female    DOB: Mar 16, 1991, 33 y.o.   MRN: [id number]  Chief Complaint  Patient presents with   flu exposure    HPI Patient is in today for flu exposure. Her aunt whom she lives with was positive for flu last week. The patient has no symptoms but was concerned due to her exposure and would like to be tested today. Otherwise doing well.   Review of Systems  Constitutional:  Negative for chills, fever and malaise/fatigue.  HENT:  Negative for congestion, ear pain, sinus pain and sore throat.   Respiratory:  Negative for cough.   Cardiovascular:  Negative for chest pain.        Objective:    BP 122/78 (Cuff Size: Large)   Pulse 94   Temp 98.3 F (36.8 C) (Oral)   Resp 18   Ht 5' (1.524 m)   Wt 177 lb 9.6 oz (80.6 kg)   SpO2 96%   BMI 34.69 kg/m  BP Readings from Last 3 Encounters:  12/31/23 122/78  11/25/23 122/80  08/21/23 120/78   Wt Readings from Last 3 Encounters:  12/31/23 177 lb 9.6 oz (80.6 kg)  11/25/23 180 lb 1.6 oz (81.7 kg)  08/21/23 176 lb 1.6 oz (79.9 kg)      Physical Exam Constitutional:      Appearance: Normal appearance.  HENT:     Head: Normocephalic and atraumatic.     Nose: Nose normal.     Mouth/Throat:     Mouth: Mucous membranes are moist.     Pharynx: Oropharynx is clear.  Eyes:     Conjunctiva/sclera: Conjunctivae normal.  Cardiovascular:     Rate and Rhythm: Normal rate and regular rhythm.  Pulmonary:     Effort: Pulmonary effort is normal.     Breath sounds: Normal breath sounds.  Skin:    General: Skin is warm and dry.  Neurological:     General: No focal deficit present.     Mental Status: She is alert. Mental status is at baseline.  Psychiatric:        Mood and Affect: Mood normal.        Behavior: Behavior normal.     Results for orders placed or performed in visit on 12/31/23  POCT Influenza A/B  Result Value Ref Range   Influenza A, POC  Negative Negative   Influenza B, POC Negative Negative        Assessment & Plan:   1. Exposure to the flu (Primary): Test negative for flu, no symptoms. Discussed since she is immunocompromised I can send in Tamiflu next time she is exposed.   - POCT Influenza A/B   Return for already scheduled.  Margarita Mail, DO

## 2024-01-03 DIAGNOSIS — Z419 Encounter for procedure for purposes other than remedying health state, unspecified: Secondary | ICD-10-CM | POA: Diagnosis not present

## 2024-01-08 ENCOUNTER — Other Ambulatory Visit: Payer: Self-pay | Admitting: Internal Medicine

## 2024-01-08 DIAGNOSIS — F331 Major depressive disorder, recurrent, moderate: Secondary | ICD-10-CM

## 2024-01-09 NOTE — Telephone Encounter (Signed)
 Requested Prescriptions  Pending Prescriptions Disp Refills   DULoxetine (CYMBALTA) 20 MG capsule [Pharmacy Med Name: DULoxetine HCl 20 MG Oral Capsule Delayed Release Particles] 120 capsule 0    Sig: Take 2 capsules by mouth once daily     Psychiatry: Antidepressants - SNRI - duloxetine Failed - 01/09/2024  8:03 AM      Failed - Cr in normal range and within 360 days    Creat  Date Value Ref Range Status  11/19/2022 0.71 0.50 - 0.97 mg/dL Final         Failed - eGFR is 30 or above and within 360 days    EGFR (African American)  Date Value Ref Range Status  09/19/2014 >60 >44mL/min Final   GFR calc Af Amer  Date Value Ref Range Status  05/06/2020 >60 >60 mL/min Final   EGFR (Non-African Amer.)  Date Value Ref Range Status  09/19/2014 >60 >69mL/min Final    Comment:    eGFR values <68mL/min/1.73 m2 may be an indication of chronic kidney disease (CKD). Calculated eGFR, using the MRDR Study equation, is useful in  patients with stable renal function. The eGFR calculation will not be reliable in acutely ill patients when serum creatinine is changing rapidly. It is not useful in patients on dialysis. The eGFR calculation may not be applicable to patients at the low and high extremes of body sizes, pregnant women, and vegetarians.    GFR, Estimated  Date Value Ref Range Status  07/24/2022 >60 >60 mL/min Final    Comment:    (NOTE) Calculated using the CKD-EPI Creatinine Equation (2021)    eGFR  Date Value Ref Range Status  11/19/2022 116 > OR = 60 mL/min/1.62m2 Final         Passed - Completed PHQ-2 or PHQ-9 in the last 360 days      Passed - Last BP in normal range    BP Readings from Last 1 Encounters:  12/31/23 122/78         Passed - Valid encounter within last 6 months    Recent Outpatient Visits           1 month ago Intermittent asthma without complication, unspecified asthma severity   [facility] Margarita Mail, DO   4  months ago Major depressive disorder, recurrent episode, moderate [facility])   [facility] Margarita Mail, DO   6 months ago History of CVA with residual deficit   [facility] Margarita Mail, DO   7 months ago History of CVA with residual deficit   [facility] Margarita Mail, DO   8 months ago Allergic rhinitis due to pollen, unspecified seasonality   [facility] Margarita Mail, DO       Future Appointments             In 2 months Margarita Mail, DO [facility], [facility]

## 2024-01-13 ENCOUNTER — Other Ambulatory Visit: Payer: Self-pay | Admitting: Internal Medicine

## 2024-01-13 DIAGNOSIS — I6930 Unspecified sequelae of cerebral infarction: Secondary | ICD-10-CM

## 2024-01-13 NOTE — Telephone Encounter (Signed)
 Copied from CRM [id number]. Topic: Clinical - Medication Refill >> Jan 13, 2024 11:12 AM Patsy Lager T wrote: Most Recent Primary Care Visit:  Provider: Margarita Mail  Department: [facility]  Visit Type: ACUTE  Date: 12/31/2023  Medication:  baclofen (LIORESAL) 10 MG tablet    Has the patient contacted their pharmacy? Yes  Is this the correct pharmacy for this prescription? Yes   This is the patient's preferred pharmacy:  [facility] [address]), [place] - [address] [place]) [place] [address] Phone: [phone] Fax: [phone]  Has the prescription been filled recently? Yes  Is the patient out of the medication? Yes  Has the patient been seen for an appointment in the last year OR does the patient have an upcoming appointment? Yes  Can we respond through MyChart? Yes  Agent: Please be advised that Rx refills may take up to 3 business days. We ask that you follow-up with your pharmacy.

## 2024-01-14 MED ORDER — BACLOFEN 10 MG PO TABS
ORAL_TABLET | ORAL | 1 refills | Status: DC
Start: 2024-01-14 — End: 2024-06-18

## 2024-01-14 NOTE — Telephone Encounter (Signed)
 Requested medication (s) are due for refill today:  yes  Requested medication (s) are on the active medication list: yes  Last refill:  07/10/23 #180/1  Future visit scheduled: yes  Notes to clinic:  Unable to refill per protocol due to failed labs, no updated results.     Requested Prescriptions  Pending Prescriptions Disp Refills   baclofen (LIORESAL) 10 MG tablet 180 tablet 1    Sig: Take 5 mg in the morning, 10 mg at lunch and 20 mg at night.     Analgesics:  Muscle Relaxants - baclofen Failed - 01/14/2024 10:13 AM      Failed - Cr in normal range and within 180 days    Creat  Date Value Ref Range Status  11/19/2022 0.71 0.50 - 0.97 mg/dL Final         Failed - eGFR is 30 or above and within 180 days    EGFR (African American)  Date Value Ref Range Status  09/19/2014 >60 >64mL/min Final   GFR calc Af Amer  Date Value Ref Range Status  05/06/2020 >60 >60 mL/min Final   EGFR (Non-African Amer.)  Date Value Ref Range Status  09/19/2014 >60 >61mL/min Final    Comment:    eGFR values <50mL/min/1.73 m2 may be an indication of chronic kidney disease (CKD). Calculated eGFR, using the MRDR Study equation, is useful in  patients with stable renal function. The eGFR calculation will not be reliable in acutely ill patients when serum creatinine is changing rapidly. It is not useful in patients on dialysis. The eGFR calculation may not be applicable to patients at the low and high extremes of body sizes, pregnant women, and vegetarians.    GFR, Estimated  Date Value Ref Range Status  07/24/2022 >60 >60 mL/min Final    Comment:    (NOTE) Calculated using the CKD-EPI Creatinine Equation (2021)    eGFR  Date Value Ref Range Status  11/19/2022 116 > OR = 60 mL/min/1.61m2 Final         Passed - Valid encounter within last 6 months    Recent Outpatient Visits           1 month ago Intermittent asthma without complication, unspecified asthma severity   [facility] Margarita Mail, DO   4 months ago Major depressive disorder, recurrent episode, moderate [facility])   [place] [facility] Margarita Mail, DO   6 months ago History of CVA with residual deficit   [facility] Margarita Mail, DO   7 months ago History of CVA with residual deficit   [facility] Margarita Mail, DO   8 months ago Allergic rhinitis due to pollen, unspecified seasonality   [facility] Margarita Mail, DO       Future Appointments             In 2 months Margarita Mail, DO [facility], [facility]

## 2024-01-21 DIAGNOSIS — Z7689 Persons encountering health services in other specified circumstances: Secondary | ICD-10-CM | POA: Diagnosis not present

## 2024-01-21 DIAGNOSIS — G8111 Spastic hemiplegia affecting right dominant side: Secondary | ICD-10-CM | POA: Diagnosis not present

## 2024-01-21 DIAGNOSIS — I6930 Unspecified sequelae of cerebral infarction: Secondary | ICD-10-CM | POA: Diagnosis not present

## 2024-01-21 DIAGNOSIS — Q282 Arteriovenous malformation of cerebral vessels: Secondary | ICD-10-CM | POA: Diagnosis not present

## 2024-01-26 ENCOUNTER — Telehealth: Payer: Self-pay

## 2024-01-26 NOTE — Telephone Encounter (Signed)
 Cover My Med Prior Auth on    fluticasone-salmeterol (ADVAIR) 100-50 MCG/ACT AEPB  Key [id number]

## 2024-01-28 NOTE — Telephone Encounter (Signed)
PA sent to [facility]

## 2024-01-29 ENCOUNTER — Ambulatory Visit: Payer: Medicaid Other

## 2024-01-29 ENCOUNTER — Other Ambulatory Visit: Payer: Self-pay

## 2024-01-29 ENCOUNTER — Ambulatory Visit: Payer: Medicaid Other | Attending: Internal Medicine | Admitting: Physical Therapy

## 2024-01-29 DIAGNOSIS — M6281 Muscle weakness (generalized): Secondary | ICD-10-CM

## 2024-01-29 DIAGNOSIS — R262 Difficulty in walking, not elsewhere classified: Secondary | ICD-10-CM | POA: Diagnosis present

## 2024-01-29 DIAGNOSIS — R278 Other lack of coordination: Secondary | ICD-10-CM

## 2024-01-29 DIAGNOSIS — Z7689 Persons encountering health services in other specified circumstances: Secondary | ICD-10-CM | POA: Diagnosis not present

## 2024-01-29 DIAGNOSIS — R2689 Other abnormalities of gait and mobility: Secondary | ICD-10-CM | POA: Insufficient documentation

## 2024-01-29 DIAGNOSIS — I6930 Unspecified sequelae of cerebral infarction: Secondary | ICD-10-CM | POA: Diagnosis not present

## 2024-01-29 DIAGNOSIS — R2681 Unsteadiness on feet: Secondary | ICD-10-CM | POA: Diagnosis present

## 2024-01-29 NOTE — Therapy (Unsigned)
 OUTPATIENT PHYSICAL THERAPY NEURO EVALUATION   Patient Name: JEFFRIE STANDER MRN: [id number] DOB:[birth year], 33 y.o., female Today's Date: 01/29/2024   PCP: Margarita Mail, DO  REFERRING PROVIDER: Margarita Mail, DO   END OF SESSION:  PT End of Session - 01/29/24 1406     Visit Number 1    Number of Visits 24    Date for PT Re-Evaluation 04/22/24    PT Start Time 1405    PT Stop Time 1445    PT Time Calculation (min) 40 min    Equipment Utilized During Treatment Gait belt    Activity Tolerance Patient tolerated treatment well;No increased pain    Behavior During Therapy WFL for tasks assessed/performed             Past Medical History:  Diagnosis Date   Arteriovenous malformation of brain    a. s/p remote coiling.   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   Congenital CHF (congestive heart failure) (HCC)    a. 03/2023 Echo: EF 60-65%, no rwma, nl RV fxn, RVSP 29.33mmHg. Mild MR. Mild Ao sclerosis w/o stenosis. Ao root 38mm.   COVID 2022   Hemorrhagic stroke (HCC) 2023   a. L-sided midbrain, thalamic ICH in setting of PCA/ACA aneurysm s/p embolization.   History of broken collarbone 2020   Hydrocephalus (HCC)    a. s/p VP shunt in childhood.   Precordial chest pain    a. 03/2023 MV: No ischemia or infarct.  EF greater than 65%.  No significant coronary calcification.   Sepsis secondary to UTI (HCC) 2021   Past Surgical History:  Procedure Laterality Date   VENTRICULOPERITONEAL SHUNT     Patient Active Problem List   Diagnosis Date Noted   History of CVA with residual deficit 11/25/2023   CHF (congestive heart failure) (HCC) 03/25/2023   Left-sided nontraumatic intracerebral hemorrhage (HCC) 08/01/2022   AVM (arteriovenous malformation) brain 07/24/2022   Asthma 07/24/2022   Major depressive disorder, recurrent episode, moderate (HCC) 02/07/2022   Sepsis secondary to UTI (HCC) 05/01/2020   VP  (ventriculoperitoneal) shunt status 05/01/2020   Right ovarian cyst 05/01/2020   Elevated LFTs 05/01/2020    ONSET DATE: September  2023  REFERRING DIAG:  Diagnosis  I69.30 (ICD-10-CM) - History of CVA with residual deficit    THERAPY DIAG:  Muscle weakness (generalized)  Difficulty in walking, not elsewhere classified  Other lack of coordination  Unsteadiness on feet  Other abnormalities of gait and mobility  Balance disorder  Rationale for Evaluation and Treatment: Rehabilitation  SUBJECTIVE:  SUBJECTIVE STATEMENT: Pt with hx of CVA in 2023 and is familiar to this clinic. States that she returns to PT to continue to improve he mobility and function.     Pt accompanied by: self  PERTINENT HISTORY:  From recent MD visit:  [name] presents to follow up on chronic medical conditions. Having some knee pain today, had been doing well with PT but her insurance is no longer paying for this until the new year.    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  Also on Lyrica 50 mg BID  -Difficulty using using right hand and right side of face - completed home PT/OT but interested in continuing with therapy outside the home.  Hx of CHF as a newborn: -Had been [facility], last seen in 2018 -Last echo 9/23 EF 58% -Denies chest pain, palpitations, shortness of breath or lower extremity swelling  PAIN:  Are you having pain? Yes:  NPRS scale: 4/10 Pain location: R shoulder  Pain description: dull pain  Aggravating factors: moving arm  Relieving factors: n/a   PRECAUTIONS: Fall  RED FLAGS: None   WEIGHT BEARING RESTRICTIONS: No  FALLS: Has patient fallen in last 6 months? No  LIVING ENVIRONMENT: Lives with: lives with their family Lives in: House/apartment Stairs: Yes: External: 2 steps; on right going up and on left going up Has following equipment at home: Single point cane  PLOF: Independent, Independent with basic ADLs, and prior to CVA   PATIENT GOALS: improve walking. "Get around better"   OBJECTIVE:  Note: Objective measures were completed at Evaluation unless otherwise noted.  DIAGNOSTIC FINDINGS:   CT 2023 IMPRESSION: 1. 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 Again, prominent arterial contribution to the AVM is seen from the left ACA and posterior circulation, with primary venous drainage into the deep venous system. Associated 7 mm aneurysm along the right anterolateral aspect of the AVM as above. 2. Diffuse tortuosity and ectasia elsewhere about the major arterial vasculature of the head and neck. No large vessel occlusion or hemodynamically significant stenosis. No other acute vascular abnormality.    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   EDEMA:  Circumferential:  distal to knee in sitting R: 41.6CM L 39cm  MUSCLE TONE: RLE: Mild noted increased tone with gait and mobility.   DTRs:  Patella 1 = Trace   POSTURE: rounded shoulders, forward head, and left pelvic obliquity  LOWER EXTREMITY ROM:     Active  Right Eval Left Eval  Hip flexion [facility]  Hip extension [facility]  Hip abduction    Hip adduction    Hip internal rotation    Hip  external rotation    Knee flexion 110 WFL  Knee extension [facility]  Ankle dorsiflexion 8 15  Ankle plantarflexion WF [facility]  Ankle inversion    Ankle eversion     (Blank rows = not tested)  LOWER EXTREMITY MMT:    MMT Right Eval Left Eval  Hip flexion 4 4+  Hip extension    Hip abduction 5 5  Hip adduction 5 5  Hip internal rotation    Hip external rotation    Knee flexion 4+ 5  Knee extension 5 5  Ankle dorsiflexion 4 5  Ankle plantarflexion    Ankle inversion    Ankle eversion    (Blank rows = not tested)  BED MOBILITY:  Sit to supine SBA Supine to sit SBA Rolling to Right SBA Rolling to Left SBA  TRANSFERS: Assistive device utilized: None  Sit to stand: SBA Stand to sit: SBA Chair to chair: SBA Floor:  need to assess    CURB:  Level of Assistance: SBA Assistive device utilized:  rail  Curb Comments: step to descent   STAIRS: Level of Assistance: SBA Stair Negotiation Technique: Step to Pattern with descent and Single Rail on Left Number of Stairs: 4  Height of Stairs: 6  Comments: mild posterior bias on descent. And step to pattern   GAIT: Gait pattern: step through pattern, decreased stance time- Right, Right hip hike, trunk rotated posterior- Left, wide BOS, and poor foot clearance- Right Distance walked: 60 Assistive device utilized: None Level of assistance: SBA Comments: see description  FUNCTIONAL TESTS:  5 times sit to stand: 12.41sec Timed up and go (TUG): 10.94 6 minute walk test: to be complete  10 meter walk test: 0.56m/s  Berg Balance Scale: 50 Functional gait assessment: 20  PATIENT SURVEYS:  ABC scale 48.75                                                                                                                              TREATMENT DATE: 01/29/2024   Eval only   PATIENT EDUCATION: Education details: POC.  Person educated: Patient Education method: Medical illustrator Education comprehension: verbalized  understanding  HOME EXERCISE PROGRAM: To be provided CGA   GOALS: Goals reviewed with patient? Yes   SHORT TERM GOALS: Target date: 02/27/2024    Patient will be independent in home exercise program to improve strength/mobility for better functional independence with ADLs. Baseline: to be given on visit 2  Goal status: INITIAL   LONG TERM GOALS: Target date: 04/23/2024    Patient will increase ABC score to equal to or greater than   10%  to demonstrate statistically significant improvement in mobility and quality of life.  Baseline: 48.75 Goal status: INITIAL  2.  Patient (> 65 years old) will complete five times sit to stand test in < 11 seconds indicating an increased LE strength and improved balance. Baseline: 12.41sec Goal status: INITIAL  3.  Patient will increase 6 min walk test by >126ft points to demonstrate decreased fall risk during functional activities Baseline: to be completed  Goal status: INITIAL  4.  Patient will increase 10 meter walk test to >1.39m/s as to improve gait speed for better  community ambulation and to reduce fall risk. Baseline: 0.65m/s Goal status: INITIAL  5.  Patient will reduce timed up and go to <10 seconds to reduce fall risk and demonstrate improved transfer/gait ability. Baseline: 10.94 Goal status: INITIAL  6.  Patient will increase dynamic gait index score to >24 as to demonstrate reduced fall risk and improved dynamic gait balance for better safety with community/home ambulation.   Baseline: 20 Goal status: INITIAL   ASSESSMENT:  CLINICAL IMPRESSION: Patient is a 33 y.o. female who was seen today for physical therapy evaluation and treatment for balance, strength and coordination deficits due to hc of CVA on 2023. Pt demonstrates continued R hemiplegia and spasticity affecting mobility. Reduced strengthen noted in the RLE and well as increased fall risk with decreased gait speed of .3m/e, FGA 20/30. Will need to assess 6 min  walk test to measure limitation to community mobility in subsequent visits. Pt will benefit from skilled PT to address strength, balance, coordination deficits to allow return to PLOF and improve overall QoL.   OBJECTIVE IMPAIRMENTS: Abnormal gait, cardiopulmonary status limiting activity, decreased activity tolerance, decreased balance, decreased coordination, decreased endurance, decreased knowledge of use of DME, decreased mobility, difficulty walking, decreased ROM, decreased strength, hypomobility, increased edema, increased fascial restrictions, increased muscle spasms, impaired flexibility, impaired sensation, impaired tone, impaired UE functional use, improper body mechanics, postural dysfunction, and pain.   ACTIVITY LIMITATIONS: carrying, lifting, bending, squatting, stairs, transfers, bed mobility, dressing, and locomotion level  PARTICIPATION LIMITATIONS: cleaning, laundry, interpersonal relationship, driving, shopping, community activity, occupation, and yard work  PERSONAL FACTORS: 1-2 comorbidities: hs of CHF and CVA  are also affecting patient's functional outcome.   REHAB POTENTIAL: Good  CLINICAL DECISION MAKING: Evolving/moderate complexity  EVALUATION COMPLEXITY: Moderate  PLAN:  PT FREQUENCY: 1-2x/week  PT DURATION: 12 weeks  PLANNED INTERVENTIONS: 97110-Therapeutic exercises, 97530- Therapeutic activity, 97112- Neuromuscular re-education, [phone]- Self Care, [id number]- Manual therapy, [phone]- Gait training, [phone]- Orthotic Fit/training, [phone]- Splinting, [phone]- Electrical stimulation (unattended), [phone]- Electrical stimulation (manual), Patient/Family education, Balance training, Stair training, Taping, Dry Needling, Joint mobilization, Joint manipulation, Spinal manipulation, Spinal mobilization, Vestibular training, Visual/preceptual remediation/compensation, DME instructions, Cryotherapy, and Moist heat  PLAN FOR NEXT SESSION:  6 min walk test  Balance HEP    Golden Pop, PT 01/29/2024, 2:09 PM

## 2024-01-29 NOTE — Therapy (Signed)
 OUTPATIENT OCCUPATIONAL THERAPY NEURO EVALUATION  Patient Name: Anna Tapia MRN: [id number] DOB:1991-02-03, 33 y.o., female Today's Date: 02/01/2024  PCP: Dr. Margarita Mail  REFERRING PROVIDER: Dr. Margarita Mail  END OF SESSION:  OT End of Session - 02/01/24 2055     Visit Number 1    Number of Visits 24    Date for OT Re-Evaluation 04/22/24    Authorization Type [employer]; Berkley Harvey required    Progress Note Due on Visit 10    OT Start Time 1445    OT Stop Time 1530    OT Time Calculation (min) 45 min    Activity Tolerance Patient tolerated treatment well    Behavior During Therapy WFL for tasks assessed/performed            Past Medical History:  Diagnosis Date   Arteriovenous malformation of brain    a. s/p remote coiling.   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   Congenital CHF (congestive heart failure) (HCC)    a. 03/2023 Echo: EF 60-65%, no rwma, nl RV fxn, RVSP 29.67mmHg. Mild MR. Mild Ao sclerosis w/o stenosis. Ao root 38mm.   COVID 2022   Hemorrhagic stroke (HCC) 2023   a. L-sided midbrain, thalamic ICH in setting of PCA/ACA aneurysm s/p embolization.   History of broken collarbone 2020   Hydrocephalus (HCC)    a. s/p VP shunt in childhood.   Precordial chest pain    a. 03/2023 MV: No ischemia or infarct.  EF greater than 65%.  No significant coronary calcification.   Sepsis secondary to UTI (HCC) 2021   Past Surgical History:  Procedure Laterality Date   VENTRICULOPERITONEAL SHUNT     Patient Active Problem List   Diagnosis Date Noted   History of CVA with residual deficit 11/25/2023   CHF (congestive heart failure) (HCC) 03/25/2023   Left-sided nontraumatic intracerebral hemorrhage (HCC) 08/01/2022   AVM (arteriovenous malformation) brain 07/24/2022   Asthma 07/24/2022   Major depressive disorder, recurrent episode, moderate (HCC) 02/07/2022   Sepsis secondary to UTI (HCC) 05/01/2020   VP  (ventriculoperitoneal) shunt status 05/01/2020   Right ovarian cyst 05/01/2020   Elevated LFTs 05/01/2020    ONSET DATE: Sept 2023  REFERRING DIAG: I69.30 (ICD-10-CM) - History of CVA with residual deficit   THERAPY DIAG:  Muscle weakness (generalized)  Other lack of coordination  History of CVA with residual deficit  Rationale for Evaluation and Treatment: Rehabilitation  SUBJECTIVE:   SUBJECTIVE STATEMENT: Pt feels the Botox has made it easier to open up her hand.   Pt accompanied by: self  PERTINENT HISTORY: Pt known to this clinic after participation in therapy in 2024 following her L CVA in 2023.  Pt was discharged from OT in Oct of 2024 d/t visit limitations with therapy.  Pt returns today to address RUE residual deficits from CVA after most recent Botox injections on 01/21/24.  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 Yes: NPRS scale: 4-5/10 R shoulder from recent Botox  Pain location: R shoulder  Pain description: achy Aggravating factors: Botox shot Relieving factors: heat, rest  FALLS: Has patient fallen in last 6 months? No  LIVING ENVIRONMENT: Lives with: lives  with their family mother and 3 brothers, but mostly staying with her aunt  Lives in: ground level apartment with aunt  Stairs: Yes: External: 3 steps; bilateral but cannot reach both Has following equipment at home: Single point cane  PLOF: Independent prior to CVA  PATIENT GOALS: "Using my arm more."   OBJECTIVE:  Note: Objective measures were completed at Evaluation unless otherwise noted.  HAND DOMINANCE: Left  ADLs: Overall ADLs: Pt reports that she tries to engage the R hand into everything she does Transfers/ambulation related to ADLs:  indep Eating: cuts food with L hand Grooming: increased time to engage the R  UB Dressing: Increased time with clothing fasteners LB Dressing: wears slip on shoes; wore jeans today and did ok with the clothing fasteners, extra time  Toileting: indep Bathing: occasional pain when reaching over with the R arm to bathe the L arm (R pec and shoulder pain) Tub Shower transfers: modified indep Equipment: Shower seat with back, Grab bars, and hand held shower hose   IADLs: Shopping: pt can go shopping accompanied by a family member  Light housekeeping: pt reports unable to sweep; pt reports she gets dizzy  Meal Prep: Pt reports she now cooks about 3 nights a week, but has difficulty opening containers/jars/food packages in the [employer] mobility: uses cane Medication management: modified indep; increased time to engage the R hand Financial management: mother manages (pt states she doesn't really have any bills)  Handwriting:  NT (pt is L hand dominant)  MOBILITY STATUS:  Uses cane for longer distances, but did not bring cane today  POSTURE COMMENTS:  rounded shoulders Sitting balance: Moves/returns truncal midpoint >2 inches in all planes  ACTIVITY TOLERANCE: Activity tolerance: Pt fatigues with IADLs and community mobility  FUNCTIONAL OUTCOME MEASURES: Upper Extremity Functional Scale (UEFS): TBD  UPPER EXTREMITY ROM:    Active ROM Left  Eval WNL throughout Right OT d/c on 08/12/23 (Previous episode of care) Right Eval on 01/29/24    Shoulder flexion  133 (135) 125 (134)   Shoulder abduction  120 (125) 90 (104)   Shoulder adduction      Shoulder extension      Shoulder internal rotation  R thumb to lumbar spine (better clearance of SI joint) To R side of lower back-good clearance of SI joint   Shoulder external rotation  Hand to back of head without chin tuck and good abd  63* with arm abd (Slight scaption)   Elbow flexion      Elbow extension      Wrist flexion       Wrist extension      Wrist ulnar deviation      Wrist radial deviation      Wrist pronation      Wrist supination      (Blank rows = not tested)  UPPER EXTREMITY MMT:     MMT Left Eval 5/5 throughout Right OT d/c on 08/12/23 (Previous episode of care) Right Eval 01/29/24   Shoulder flexion  4- 4   Shoulder abduction  4- 4   Shoulder adduction      Shoulder extension      Shoulder internal rotation  4 4   Shoulder external rotation  4- 4   Middle trapezius      Lower trapezius      Elbow flexion  4+ 4+   Elbow extension  5 5   Wrist flexion  4+ 4+   Wrist extension  4+ 4+  Wrist ulnar deviation      Wrist radial deviation      Wrist pronation      Wrist supination      (Blank rows = not tested)  HAND FUNCTION: 08/12/23: Grip strength: Right: 14 lbs, Left 60 lbs; lateral pinch: Right: 6 lbs, Left: 16 lbs , 3 point pinch: Right: 5 (IP flexion; Saehan pinch gauge), Left: 19 lbs  01/29/24 Eval: Grip strength: Right: 18 lbs; Left: 55 lbs, Lateral pinch: Right: 5 lbs, Left: 14 lbs, and 3 point pinch: Right:  Fingers slip- standard pinch gauge used 2 lbs, Left: 15 lbs  COORDINATION: 08/12/23: Right: 5 min and 19 sec; 2nd trial 3 min 49 sec, Left 23 sec,  01/29/24 Eval: 9 Hole Peg test: Right: 2 min 41 sec  sec; Left: 23 sec  SENSATION: Light touch: Impaired , tingling in the R hand   EDEMA: No visible edema  MUSCLE TONE: RUE: Mild and Moderate (Last round of Botox on 01/21/24)  COGNITION: Overall cognitive status: Within functional limits for tasks assessed  VISION: WFL; no recent changes  PERCEPTION: WFL  PRAXIS: Impaired: Motor planning and Clonus R hand  OBSERVATIONS:  Pt remains very motivated to increase functional use of RUE for daily tasks.                                                                                                              TREATMENT DATE: 01/29/24 Evaluation completed.   PATIENT EDUCATION: Education details: Progress/changes from d/c  in Oct 2024 to new eval today Person educated: Patient Education method: Explanation Education comprehension: verbalized understanding  HOME EXERCISE PROGRAM: To be initiated in upcoming sessions  GOALS: Goals reviewed with patient? Yes  SHORT TERM GOALS: Target date: 03/11/24  Pt will be indep to perform HEP for improving R hand strength and coordination for daily tasks. Baseline: Eval: Not yet initiated Goal status: INITIAL   LONG TERM GOALS: Target date: 04/22/24  Pt will increase UEFS by 10 or more points to indicate self perceived functional improvement when engaging the RUE into daily tasks. Baseline: Eval: TBD Goal status: INITIAL  2.  Pt will increase R grip strength by 5 or more lbs to securely stabilize containers and jars for easier opening.  Baseline: Eval: R grip strength 18 lbs (L 55 lbs) Goal status: INITIAL  3.  Pt will increase R hand dexterity/FMC skills to manipulate clothing fasteners with bilat hands.  Baseline:  Goal status: INITIAL  4.  Pt will increase R active shoulder flexion to 140* or better to achieve functional ROM for UB ADLs and reaching for ADL supplies.  Baseline: Eval: R shoulder flexion 125*; difficulty reaching overhead for ADL supplies Goal status: INITIAL  ASSESSMENT:  CLINICAL IMPRESSION: Patient is a 33 y.o. female who was seen today for occupational therapy evaluation for continuation of therapy to target functional decline d/t residual deficits from L CVA in 2023.  Pt was treated by this therapist in 2024, but had to be discharged in Oct d/t insurance visit limitations,  despite continued progress being made.  Pt has recently had another round of Botox on 01/21/24, and is motivated to restart occupational therapy to increase functional use of the R arm for daily tasks.  Pt reports that she remained consistent in her efforts to engage the RUE into daily tasks since her OT d/c in Oct of 2024, and has actually shown progress today in R hand  [facility] and strength throughout the RUE as compared to most of her d/c measures in Oct.  Pt continues to present with RUE pain, stiffness in the shoulder, muscle weakness, and lack of coordination, including clonus in R hand.  Pt will benefit from skilled OT to address RUE deficits in order to improve indep and efficiency when engaging the RUE into daily tasks.    PERFORMANCE DEFICITS: in functional skills including ADLs, IADLs, coordination, dexterity, sensation, tone, ROM, strength, pain, flexibility, Fine motor control, Gross motor control, mobility, balance, body mechanics, endurance, decreased knowledge of use of DME, and UE functional use, and psychosocial skills including coping strategies, environmental adaptation, habits, and routines and behaviors.   IMPAIRMENTS: are limiting patient from ADLs, IADLs, work, leisure, and social participation.   CO-MORBIDITIES: has co-morbidities such as CHF, asthma, depression  that affects occupational performance. Patient will benefit from skilled OT to address above impairments and improve overall function.  MODIFICATION OR ASSISTANCE TO COMPLETE EVALUATION: No modification of tasks or assist necessary to complete an evaluation.  OT OCCUPATIONAL PROFILE AND HISTORY: Detailed assessment: Review of records and additional review of physical, cognitive, psychosocial history related to current functional performance.  CLINICAL DECISION MAKING: Moderate - several treatment options, min-mod task modification necessary  REHAB POTENTIAL: Good  EVALUATION COMPLEXITY: Moderate    PLAN:  OT FREQUENCY: 1-2x/week  OT DURATION: 12 weeks  PLANNED INTERVENTIONS: 97168 OT Re-evaluation, 97535 self care/ADL training, 96045 therapeutic exercise, 97530 therapeutic activity, 97112 neuromuscular re-education, 97140 manual therapy, 97010 moist heat, 97010 cryotherapy, passive range of motion, balance training, functional mobility training, psychosocial skills training,  energy conservation, coping strategies training, patient/family education, and DME and/or AE instructions  RECOMMENDED OTHER SERVICES: Pt completed PT evaluation this date  CONSULTED AND AGREED WITH PLAN OF CARE: Patient  PLAN FOR NEXT SESSION: see above  Danelle Earthly, MS, OTR/L  Otis Dials, OT 02/01/2024, 8:59 PM

## 2024-02-05 ENCOUNTER — Ambulatory Visit: Payer: Medicaid Other

## 2024-02-05 ENCOUNTER — Ambulatory Visit: Payer: Medicaid Other | Attending: Internal Medicine | Admitting: Physical Therapy

## 2024-02-05 DIAGNOSIS — I6930 Unspecified sequelae of cerebral infarction: Secondary | ICD-10-CM | POA: Insufficient documentation

## 2024-02-05 DIAGNOSIS — R2681 Unsteadiness on feet: Secondary | ICD-10-CM | POA: Insufficient documentation

## 2024-02-05 DIAGNOSIS — Z7689 Persons encountering health services in other specified circumstances: Secondary | ICD-10-CM | POA: Diagnosis not present

## 2024-02-05 DIAGNOSIS — R2689 Other abnormalities of gait and mobility: Secondary | ICD-10-CM | POA: Diagnosis not present

## 2024-02-05 DIAGNOSIS — R278 Other lack of coordination: Secondary | ICD-10-CM | POA: Diagnosis not present

## 2024-02-05 DIAGNOSIS — R262 Difficulty in walking, not elsewhere classified: Secondary | ICD-10-CM | POA: Insufficient documentation

## 2024-02-05 DIAGNOSIS — M6281 Muscle weakness (generalized): Secondary | ICD-10-CM | POA: Insufficient documentation

## 2024-02-05 NOTE — Therapy (Signed)
 OUTPATIENT PHYSICAL THERAPY NEURO EVALUATION   Patient Name: Anna Tapia MRN: [id number] DOB:07/11/1991, 33 y.o., female Today's Date: 02/05/2024   PCP: Anna Mail, DO  REFERRING PROVIDER: Margarita Mail, DO   END OF SESSION:  PT End of Session - 02/05/24 1331     Visit Number 2    Number of Visits 24    Date for PT Re-Evaluation 04/22/24    Equipment Utilized During Treatment Gait belt    Activity Tolerance Patient tolerated treatment well;No increased pain    Behavior During Therapy WFL for tasks assessed/performed             Past Medical History:  Diagnosis Date   Arteriovenous malformation of brain    a. s/p remote coiling.   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   Congenital CHF (congestive heart failure) (HCC)    a. 03/2023 Echo: EF 60-65%, no rwma, nl RV fxn, RVSP 29.32mmHg. Mild MR. Mild Ao sclerosis w/o stenosis. Ao root 38mm.   COVID 2022   Hemorrhagic stroke (HCC) 2023   a. L-sided midbrain, thalamic ICH in setting of PCA/ACA aneurysm s/p embolization.   History of broken collarbone 2020   Hydrocephalus (HCC)    a. s/p VP shunt in childhood.   Precordial chest pain    a. 03/2023 MV: No ischemia or infarct.  EF greater than 65%.  No significant coronary calcification.   Sepsis secondary to UTI (HCC) 2021   Past Surgical History:  Procedure Laterality Date   VENTRICULOPERITONEAL SHUNT     Patient Active Problem List   Diagnosis Date Noted   History of CVA with residual deficit 11/25/2023   CHF (congestive heart failure) (HCC) 03/25/2023   Left-sided nontraumatic intracerebral hemorrhage (HCC) 08/01/2022   AVM (arteriovenous malformation) brain 07/24/2022   Asthma 07/24/2022   Major depressive disorder, recurrent episode, moderate (HCC) 02/07/2022   Sepsis secondary to UTI (HCC) 05/01/2020   VP (ventriculoperitoneal) shunt status 05/01/2020   Right ovarian cyst 05/01/2020   Elevated LFTs  05/01/2020    ONSET DATE: September  2023  REFERRING DIAG:  Diagnosis  I69.30 (ICD-10-CM) - History of CVA with residual deficit    THERAPY DIAG:  Muscle weakness (generalized)  Other lack of coordination  Difficulty in walking, not elsewhere classified  Unsteadiness on feet  History of CVA with residual deficit  Other abnormalities of gait and mobility  Balance disorder  Rationale for Evaluation and Treatment: Rehabilitation  SUBJECTIVE:  SUBJECTIVE STATEMENT: Pt with hx of CVA in 2023 and is familiar to this clinic.  Glad to be back in PT because she does not feel like she is moving as good as she was last year. No pain   Pt accompanied by: self  PERTINENT HISTORY:  From recent MD visit:  Anna Tapia presents to follow up on chronic medical conditions. Having some knee pain today, had been doing well with PT but her insurance is no longer paying for this until the new year.    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  Also on Lyrica 50 mg BID  -Difficulty using using right hand and right side of face - completed home PT/OT but interested in continuing with therapy outside the home.  Hx of CHF as a newborn: -Had been following with [facility]iology at [facility], last seen in 2018 -Last echo 9/23 EF 58% -Denies chest pain, palpitations, shortness of breath or lower extremity swelling  PAIN:  Are you having pain? Yes: NPRS scale: 4/10 Pain  location: R shoulder  Pain description: dull pain  Aggravating factors: moving arm  Relieving factors: n/a   PRECAUTIONS: Fall  RED FLAGS: None   WEIGHT BEARING RESTRICTIONS: No  FALLS: Has patient fallen in last 6 months? No  LIVING ENVIRONMENT: Lives with: lives with their family Lives in: House/apartment Stairs: Yes: External: 2 steps; on right going up and on left going up Has following equipment at home: Single point cane  PLOF: Independent, Independent with basic ADLs, and prior to CVA   PATIENT GOALS: improve walking. "Get around better"   OBJECTIVE:  Note: Objective measures were completed at Evaluation unless otherwise noted.  DIAGNOSTIC FINDINGS:   CT 2023 IMPRESSION: 1. 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 Again, prominent arterial contribution to the AVM is seen from the left ACA and posterior circulation, with primary venous drainage into the deep venous system. Associated 7 mm aneurysm along the right anterolateral aspect of the AVM as above. 2. Diffuse tortuosity and ectasia elsewhere about the major arterial vasculature of the head and neck. No large vessel occlusion or hemodynamically significant stenosis. No other acute vascular abnormality.    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   EDEMA:  Circumferential:  distal to knee in sitting R: 41.6CM L 39cm  MUSCLE TONE: RLE: Mild noted increased tone with gait and mobility.   DTRs:  Patella 1 = Trace   POSTURE: rounded shoulders, forward head, and left pelvic obliquity  LOWER EXTREMITY ROM:     Active  Right Eval Left Eval  Hip flexion [facility]  Hip extension [facility]  Hip abduction    Hip adduction    Hip internal rotation    Hip external rotation    Knee  flexion 110 WFL  Knee extension [facility]  Ankle dorsiflexion 8 15  Ankle plantarflexion [facility]  Ankle inversion    Ankle eversion     (  Blank rows = not tested)  LOWER EXTREMITY MMT:    MMT Right Eval Left Eval  Hip flexion 4 4+  Hip extension    Hip abduction 5 5  Hip adduction 5 5  Hip internal rotation    Hip external rotation    Knee flexion 4+ 5  Knee extension 5 5  Ankle dorsiflexion 4 5  Ankle plantarflexion    Ankle inversion    Ankle eversion    (Blank rows = not tested)  BED MOBILITY:  Sit to supine SBA Supine to sit SBA Rolling to Right SBA Rolling to Left SBA  TRANSFERS: Assistive device utilized: None  Sit to stand: SBA Stand to sit: SBA Chair to chair: SBA Floor:  need to assess    CURB:  Level of Assistance: SBA Assistive device utilized:  rail  Curb Comments: step to descent   STAIRS: Level of Assistance: SBA Stair Negotiation Technique: Step to Pattern with descent and Single Rail on Left Number of Stairs: 4  Height of Stairs: 6  Comments: mild posterior bias on descent. And step to pattern   GAIT: Gait pattern: step through pattern, decreased stance time- Right, Right hip hike, trunk rotated posterior- Left, wide BOS, and poor foot clearance- Right Distance walked: 60 Assistive device utilized: None Level of assistance: SBA Comments: see description  FUNCTIONAL TESTS:  5 times sit to stand: 12.41sec Timed up and go (TUG): 10.94 6 minute walk test: to be complete  10 meter walk test: 0.38m/s  Berg Balance Scale: 50 Functional gait assessment: 20  PATIENT SURVEYS:  ABC scale 48.75                                                                                                                              TREATMENT DATE: 01/29/2024   6 Min Walk Test:  Instructed patient to ambulate as quickly and as safely as possible for 6 minutes using LRAD. Patient was allowed to take standing rest breaks without stopping the test, but if the  patient required a sitting rest break the clock would be stopped and the test would be over.  Results: 921 feet using a no AD with supervision assist. Results indicate that the patient has reduced endurance with ambulation compared to age matched norms.  Age Matched Norms: 43-69 yo M: 36 F: 50, 54-79 yo M: 68 F: 471, 17-89 yo M: 417 F: 392 MDC: 58.21 meters (190.98 feet) or 50 meters (ANPTA Core Set of Outcome Measures for Adults with Neurologic Conditions, 2018)  Access Code: [email or web address] URL: [email or web address] Date: 02/05/2024 Prepared by: Grier Rocher  Exercises - Seated March with Ankle Weights at Foot  - 1 x daily - 7 x weekly - 3 sets - 10 reps - Side Stepping with Resistance at Feet  - 1 x daily - 7 x weekly - 3 sets - 10 reps - Tandem Stance  - 1 x daily - 5 x weekly - 3 sets -  4 reps - 20 hold - Staggered Sit-to-Stand  - 1 x daily - 5 x weekly - 2 sets - 5 reps - Sit to Stand with Arms Crossed  - 1 x daily - 5 x weekly - 3 sets - 10 reps - Seated Knee Extension with Resistance  - 1 x daily - 7 x weekly - 3 sets - 10 reps - Seated Hip Abduction with Resistance  - 1 x daily - 7 x weekly - 3 sets - 10 reps   CGA for safety in tandem stance. Cues for full ROM and decreased trunkal compensations with standing hip exercises, cues for ankle Eversion position throughout session to reduce tone.    PATIENT EDUCATION: Education details: POC.  Person educated: Patient Education method: Medical illustrator Education comprehension: verbalized understanding  HOME EXERCISE PROGRAM: Access Code: [email or web address] URL: [email or web address] Date: 02/05/2024 Prepared by: Grier Rocher  Exercises - Seated March with Ankle Weights at Foot  - 1 x daily - 7 x weekly - 3 sets - 10 reps - Side Stepping with Resistance at Feet  - 1 x daily - 7 x weekly - 3 sets - 10 reps - Tandem Stance  - 1 x daily - 5 x weekly - 3 sets - 4 reps - 20 hold - Staggered  Sit-to-Stand  - 1 x daily - 5 x weekly - 2 sets - 5 reps - Sit to Stand with Arms Crossed  - 1 x daily - 5 x weekly - 3 sets - 10 reps - Seated Knee Extension with Resistance  - 1 x daily - 7 x weekly - 3 sets - 10 reps - Seated Hip Abduction with Resistance  - 1 x daily - 7 x weekly - 3 sets - 10 reps  GOALS: Goals reviewed with patient? Yes   SHORT TERM GOALS: Target date: 02/27/2024    Patient will be independent in home exercise program to improve strength/mobility for better functional independence with ADLs. Baseline: to be given on visit 2  Goal status: INITIAL   LONG TERM GOALS: Target date: 04/23/2024    Patient will increase ABC score to equal to or greater than   10%  to demonstrate statistically significant improvement in mobility and quality of life.  Baseline: 48.75 Goal status: INITIAL  2.  Patient (> 55 years old) will complete five times sit to stand test in < 11 seconds indicating an increased LE strength and improved balance. Baseline: 12.41sec Goal status: INITIAL  3.  Patient will increase 6 min walk test by >144ft points to demonstrate decreased fall risk during functional activities Baseline: 950ft without AD. No rest break needed.  Goal status: INITIAL  4.  Patient will increase 10 meter walk test to >1.2m/s as to improve gait speed for better community ambulation and to reduce fall risk. Baseline: 0.73m/s Goal status: INITIAL  5.  Patient will reduce timed up and go to <10 seconds to reduce fall risk and demonstrate improved transfer/gait ability. Baseline: 10.94 Goal status: INITIAL  6.  Patient will increase dynamic gait index score to >24 as to demonstrate reduced fall risk and improved dynamic gait balance for better safety with community/home ambulation.   Baseline: 20 Goal status: INITIAL   ASSESSMENT:  CLINICAL IMPRESSION: Patient is a 33 y.o. female who was seen today for physical therapy treatment for balance, strength and  coordination deficits due to hc of CVA on 2023. Pt completed 6 min walk test indicating mild reduced in community mobility since  prior PT episode; decreased 94ft with no AD. Improved DF noted. Provided BLE strengthening and balance HEP as listed above.  Pt will benefit from skilled PT to address strength, balance, coordination deficits to allow return to PLOF and improve overall QoL.   OBJECTIVE IMPAIRMENTS: Abnormal gait, cardiopulmonary status limiting activity, decreased activity tolerance, decreased balance, decreased coordination, decreased endurance, decreased knowledge of use of DME, decreased mobility, difficulty walking, decreased ROM, decreased strength, hypomobility, increased edema, increased fascial restrictions, increased muscle spasms, impaired flexibility, impaired sensation, impaired tone, impaired UE functional use, improper body mechanics, postural dysfunction, and pain.   ACTIVITY LIMITATIONS: carrying, lifting, bending, squatting, stairs, transfers, bed mobility, dressing, and locomotion level  PARTICIPATION LIMITATIONS: cleaning, laundry, interpersonal relationship, driving, shopping, community activity, occupation, and yard work  PERSONAL FACTORS: 1-2 comorbidities: hs of CHF and CVA  are also affecting patient's functional outcome.   REHAB POTENTIAL: Good  CLINICAL DECISION MAKING: Evolving/moderate complexity  EVALUATION COMPLEXITY: Moderate  PLAN:  PT FREQUENCY: 1-2x/week  PT DURATION: 12 weeks  PLANNED INTERVENTIONS: 97110-Therapeutic exercises, 97530- Therapeutic activity, 97112- Neuromuscular re-education, 97535- Self Care, 96045- Manual therapy, [phone]- Gait training, [phone]- Orthotic Fit/training, [phone]- Splinting, [phone]- Electrical stimulation (unattended), [phone]- Electrical stimulation (manual), Patient/Family education, Balance training, Stair training, Taping, Dry Needling, Joint mobilization, Joint manipulation, Spinal manipulation, Spinal mobilization,  Vestibular training, Visual/preceptual remediation/compensation, DME instructions, Cryotherapy, and Moist heat  PLAN FOR NEXT SESSION:   Dynamic balance training. R ankle and gluteal strengthening   Golden Pop, PT 02/05/2024, 1:31 PM

## 2024-02-09 ENCOUNTER — Ambulatory Visit: Payer: Medicaid Other | Admitting: Physical Therapy

## 2024-02-12 ENCOUNTER — Ambulatory Visit: Payer: Medicaid Other

## 2024-02-12 DIAGNOSIS — R278 Other lack of coordination: Secondary | ICD-10-CM

## 2024-02-12 DIAGNOSIS — R2689 Other abnormalities of gait and mobility: Secondary | ICD-10-CM | POA: Diagnosis not present

## 2024-02-12 DIAGNOSIS — R2681 Unsteadiness on feet: Secondary | ICD-10-CM

## 2024-02-12 DIAGNOSIS — M6281 Muscle weakness (generalized): Secondary | ICD-10-CM

## 2024-02-12 DIAGNOSIS — I6930 Unspecified sequelae of cerebral infarction: Secondary | ICD-10-CM | POA: Diagnosis not present

## 2024-02-12 DIAGNOSIS — R262 Difficulty in walking, not elsewhere classified: Secondary | ICD-10-CM

## 2024-02-12 NOTE — Therapy (Signed)
 OUTPATIENT OCCUPATIONAL THERAPY NEURO TREATMENT NOTE  Patient Name: Anna Tapia MRN: [id number] DOB:05-09-1991, 33 y.o., female Today's Date: 02/14/2024  PCP: Dr. Rockney Cid  REFERRING PROVIDER: Dr. Rockney Cid  END OF SESSION:  OT End of Session - 02/14/24 1915     Visit Number 2    Number of Visits 24    Date for OT Re-Evaluation 04/22/24    Authorization Type [employer]; [employer] required    Progress Note Due on Visit 10    OT Start Time 1530    OT Stop Time 1615    OT Time Calculation (min) 45 min    Activity Tolerance Patient tolerated treatment well    Behavior During Therapy WFL for tasks assessed/performed            Past Medical History:  Diagnosis Date   Arteriovenous malformation of brain    a. s/p remote coiling.   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   Congenital CHF (congestive heart failure) (HCC)    a. 03/2023 Echo: EF 60-65%, no rwma, nl RV fxn, RVSP 29.50mmHg. Mild MR. Mild Ao sclerosis w/o stenosis. Ao root 38mm.   COVID 2022   Hemorrhagic stroke (HCC) 2023   a. L-sided midbrain, thalamic ICH in setting of PCA/ACA aneurysm s/p embolization.   History of broken collarbone 2020   Hydrocephalus (HCC)    a. s/p VP shunt in childhood.   Precordial chest pain    a. 03/2023 MV: No ischemia or infarct.  EF greater than 65%.  No significant coronary calcification.   Sepsis secondary to UTI (HCC) 2021   Past Surgical History:  Procedure Laterality Date   VENTRICULOPERITONEAL SHUNT     Patient Active Problem List   Diagnosis Date Noted   History of CVA with residual deficit 11/25/2023   CHF (congestive heart failure) (HCC) 03/25/2023   Left-sided nontraumatic intracerebral hemorrhage (HCC) 08/01/2022   AVM (arteriovenous malformation) brain 07/24/2022   Asthma 07/24/2022   Major depressive disorder, recurrent episode, moderate (HCC) 02/07/2022   Sepsis secondary to UTI (HCC) 05/01/2020   VP  (ventriculoperitoneal) shunt status 05/01/2020   Right ovarian cyst 05/01/2020   Elevated LFTs 05/01/2020    ONSET DATE: Sept 2023  REFERRING DIAG: I69.30 (ICD-10-CM) - History of CVA with residual deficit   THERAPY DIAG:  Muscle weakness (generalized)  Other lack of coordination  History of CVA with residual deficit  Rationale for Evaluation and Treatment: Rehabilitation  SUBJECTIVE:   SUBJECTIVE STATEMENT: Pt reports that she can usually reduce her tremor in her hand at rest when she thinks about it, but it otherwise remains active. Pt accompanied by: self  PERTINENT HISTORY: Pt known to this clinic after participation in therapy in 2024 following her L CVA in 2023.  Pt was discharged from OT in Oct of 2024 d/t visit limitations with therapy.  Pt returns today to address RUE residual deficits from CVA after most recent Botox injections on 01/21/24.  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 Yes: NPRS scale: 4-5/10 R shoulder from recent Botox  Pain location: R shoulder  Pain description: achy Aggravating factors: Botox shot Relieving factors: heat, rest  FALLS: Has patient fallen in last 6 months? No  LIVING ENVIRONMENT: Lives with: lives with their family mother and 3 brothers, but mostly staying with her aunt  Lives in: ground level apartment with aunt  Stairs: Yes: External: 3 steps; bilateral but cannot reach both Has following equipment at home: Single point cane  PLOF: Independent prior to CVA  PATIENT GOALS: "Using my arm more."   OBJECTIVE:  Note: Objective measures were completed at Evaluation unless otherwise noted.  HAND DOMINANCE: Left  ADLs: Overall ADLs: Pt reports  that she tries to engage the R hand into everything she does Transfers/ambulation related to ADLs: indep Eating: cuts food with L hand Grooming: increased time to engage the R  UB Dressing: Increased time with clothing fasteners LB Dressing: wears slip on shoes; wore jeans today and did ok with the clothing fasteners, extra time  Toileting: indep Bathing: occasional pain when reaching over with the R arm to bathe the L arm (R pec and shoulder pain) Tub Shower transfers: modified indep Equipment: Shower seat with back, Grab bars, and hand held shower hose   IADLs: Shopping: pt can go shopping accompanied by a family member  Light housekeeping: pt reports unable to sweep; pt reports she gets dizzy  Meal Prep: Pt reports she now cooks about 3 nights a week, but has difficulty opening containers/jars/food packages in the [employer] mobility: uses cane Medication management: modified indep; increased time to engage the R hand Financial management: mother manages (pt states she doesn't really have any bills)  Handwriting:  NT (pt is L hand dominant)  MOBILITY STATUS:  Uses cane for longer distances, but did not bring cane today  POSTURE COMMENTS:  rounded shoulders Sitting balance: Moves/returns truncal midpoint >2 inches in all planes  ACTIVITY TOLERANCE: Activity tolerance: Pt fatigues with IADLs and community mobility  FUNCTIONAL OUTCOME MEASURES: Upper Extremity Functional Scale (UEFS): TBD  UPPER EXTREMITY ROM:    Active ROM Left  Eval WNL throughout Right OT d/c on 08/12/23 (Previous episode of care) Right Eval on 01/29/24    Shoulder flexion  133 (135) 125 (134)   Shoulder abduction  120 (125) 90 (104)   Shoulder adduction      Shoulder extension      Shoulder internal rotation  R thumb to lumbar spine (better clearance of SI joint) To R side of lower back-good clearance of SI joint   Shoulder external rotation  Hand to back of head without chin tuck and good abd   63* with arm abd (Slight scaption)   Elbow flexion      Elbow extension      Wrist flexion      Wrist extension      Wrist ulnar deviation      Wrist radial deviation      Wrist pronation      Wrist supination      (Blank rows = not tested)  UPPER EXTREMITY MMT:     MMT Left Eval 5/5 throughout Right OT d/c on 08/12/23 (Previous episode of care) Right Eval 01/29/24   Shoulder flexion  4- 4   Shoulder abduction  4- 4   Shoulder adduction      Shoulder extension      Shoulder internal rotation  4 4   Shoulder external rotation  4- 4   Middle trapezius      Lower trapezius      Elbow flexion  4+ 4+   Elbow extension  5 5  Wrist flexion  4+ 4+   Wrist extension  4+ 4+   Wrist ulnar deviation      Wrist radial deviation      Wrist pronation      Wrist supination      (Blank rows = not tested)  HAND FUNCTION: 08/12/23: Grip strength: Right: 14 lbs, Left 60 lbs; lateral pinch: Right: 6 lbs, Left: 16 lbs , 3 point pinch: Right: 5 (IP flexion; Saehan pinch gauge), Left: 19 lbs  01/29/24 Eval: Grip strength: Right: 18 lbs; Left: 55 lbs, Lateral pinch: Right: 5 lbs, Left: 14 lbs, and 3 point pinch: Right:  Fingers slip- standard pinch gauge used 2 lbs, Left: 15 lbs  COORDINATION: 08/12/23: Right: 5 min and 19 sec; 2nd trial 3 min 49 sec, Left 23 sec,  01/29/24 Eval: 9 Hole Peg test: Right: 2 min 41 sec  sec; Left: 23 sec  SENSATION: Light touch: Impaired , tingling in the R hand   EDEMA: No visible edema  MUSCLE TONE: RUE: Mild and Moderate (Last round of Botox on 01/21/24)  COGNITION: Overall cognitive status: Within functional limits for tasks assessed  VISION: WFL; no recent changes  PERCEPTION: WFL  PRAXIS: Impaired: Motor planning and Clonus R hand  OBSERVATIONS:  Pt remains very motivated to increase functional use of RUE for daily tasks.                                                                                                              TREATMENT DATE:  02/12/24 Neuro re-ed: -Facilitated R hand FMC/dexterity skills working to gather jumbo pegs 1 by 1 from container and place into pegboard for set up of 2 trials to complete grip strengthening task noted below.  OT provided vc and visual demo to reduce substitution patterns while reaching for pegs, promoting R elbow ext and shoulder mobility in place of truncal leaning.  Therapeutic Exercise: -R grip strengthening using hand gripper at 6.6 lbs to remove jumbo pegs from pegboard x2 trials, short rest breaks between and during sets; intermittent steadying assist from OT to minimize tremor while grasping pegs with hand gripper  Therapeutic Activity: -Trial of 1/2 lb wrap around hand weight to reduce tremor during peg placement and removal -Trial of 1 lb wrist weight for same -Issued pink theraputty and instructed pt in strengthening and coordination exercises for R hand, including gross grasping, lateral/2 point/3 point pinching, digit abd/add, and digging coins out of putty.  Able to return demo with intermittent vc for technique to improve quality of movement.  Encouraged completion 5-10 min, 1-2x per day.   -Applied Ktape to dorsal thumb promote R thumb IP ext, reducing flexor synergy during lateral and 3 point pinching activities -Briefly tried oval 8 splint before Ktape, but poorly fitting and pt preferred Ktape.  PATIENT EDUCATION: Education details: HEP Person educated: Patient Education method: Explanation Education comprehension: verbalized understanding  HOME EXERCISE PROGRAM: Pink theraputty  GOALS: Goals reviewed with patient? Yes  SHORT TERM GOALS: Target date: 03/11/24  Pt will be indep  to perform HEP for improving R hand strength and coordination for daily tasks. Baseline: Eval: Not yet initiated Goal status: INITIAL   LONG TERM GOALS: Target date: 04/22/24  Pt will increase UEFS by 10 or more points to indicate self perceived functional improvement when engaging the RUE  into daily tasks. Baseline: Eval: TBD Goal status: INITIAL  2.  Pt will increase R grip strength by 5 or more lbs to securely stabilize containers and jars for easier opening.  Baseline: Eval: R grip strength 18 lbs (L 55 lbs) Goal status: INITIAL  3.  Pt will increase R hand dexterity/FMC skills to manipulate clothing fasteners with bilat hands.  Baseline:  Goal status: INITIAL  4.  Pt will increase R active shoulder flexion to 140* or better to achieve functional ROM for UB ADLs and reaching for ADL supplies.  Baseline: Eval: R shoulder flexion 125*; difficulty reaching overhead for ADL supplies Goal status: INITIAL  ASSESSMENT:  CLINICAL IMPRESSION: Distal hand and wrist weights ineffective for reducing tremors during activities noted above; pt required intermittent steadying assist from OT to minimize hand tremor when grasping peg with hand gripper.  Pt found Ktape to be beneficial for minimizing R thumb IP flexion as a result of flexor synergy pattern during pinch prehension attempts.  Oval 8 splint not used d/t poor fit.  Issued theraputty and reviewed HEP per pt request.  Written handouts provided to increase carryover at home.  Pt continues to present with RUE pain, stiffness in the shoulder, muscle weakness, and lack of coordination, including clonus in R hand.  Pt will benefit from skilled OT to address RUE deficits in order to improve indep and efficiency when engaging the RUE into daily tasks.    PERFORMANCE DEFICITS: in functional skills including ADLs, IADLs, coordination, dexterity, sensation, tone, ROM, strength, pain, flexibility, Fine motor control, Gross motor control, mobility, balance, body mechanics, endurance, decreased knowledge of use of DME, and UE functional use, and psychosocial skills including coping strategies, environmental adaptation, habits, and routines and behaviors.   IMPAIRMENTS: are limiting patient from ADLs, IADLs, work, leisure, and social  participation.   CO-MORBIDITIES: has co-morbidities such as CHF, asthma, depression  that affects occupational performance. Patient will benefit from skilled OT to address above impairments and improve overall function.  MODIFICATION OR ASSISTANCE TO COMPLETE EVALUATION: No modification of tasks or assist necessary to complete an evaluation.  OT OCCUPATIONAL PROFILE AND HISTORY: Detailed assessment: Review of records and additional review of physical, cognitive, psychosocial history related to current functional performance.  CLINICAL DECISION MAKING: Moderate - several treatment options, min-mod task modification necessary  REHAB POTENTIAL: Good  EVALUATION COMPLEXITY: Moderate    PLAN:  OT FREQUENCY: 1-2x/week  OT DURATION: 12 weeks  PLANNED INTERVENTIONS: 97168 OT Re-evaluation, 97535 self care/ADL training, 16109 therapeutic exercise, 97530 therapeutic activity, 97112 neuromuscular re-education, 97140 manual therapy, 97010 moist heat, 97010 cryotherapy, passive range of motion, balance training, functional mobility training, psychosocial skills training, energy conservation, coping strategies training, patient/family education, and DME and/or AE instructions  RECOMMENDED OTHER SERVICES: Pt completed PT evaluation this date  CONSULTED AND AGREED WITH PLAN OF CARE: Patient  PLAN FOR NEXT SESSION: see above  Marcus Sewer, MS, OTR/L  Casandra Claw, OT 02/14/2024, 7:18 PM

## 2024-02-12 NOTE — Therapy (Signed)
 OUTPATIENT PHYSICAL THERAPY NEURO TREATMENT   Patient Name: Anna Tapia MRN: [id number] DOB:October 09, 1991, 33 y.o., female Today's Date: 02/12/2024   PCP: Margarita Mail, DO  REFERRING PROVIDER: Margarita Mail, DO   END OF SESSION:  PT End of Session - 02/12/24 1444     Visit Number 3    Number of Visits 24    Date for PT Re-Evaluation 04/22/24    PT Start Time 1445    PT Stop Time 1530    PT Time Calculation (min) 45 min    Equipment Utilized During Treatment Gait belt    Activity Tolerance Patient tolerated treatment well;No increased pain    Behavior During Therapy WFL for tasks assessed/performed             Past Medical History:  Diagnosis Date   Arteriovenous malformation of brain    a. s/p remote coiling.   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   Congenital CHF (congestive heart failure) (HCC)    a. 03/2023 Echo: EF 60-65%, no rwma, nl RV fxn, RVSP 29.79mmHg. Mild MR. Mild Ao sclerosis w/o stenosis. Ao root 38mm.   COVID 2022   Hemorrhagic stroke (HCC) 2023   a. L-sided midbrain, thalamic ICH in setting of PCA/ACA aneurysm s/p embolization.   History of broken collarbone 2020   Hydrocephalus (HCC)    a. s/p VP shunt in childhood.   Precordial chest pain    a. 03/2023 MV: No ischemia or infarct.  EF greater than 65%.  No significant coronary calcification.   Sepsis secondary to UTI (HCC) 2021   Past Surgical History:  Procedure Laterality Date   VENTRICULOPERITONEAL SHUNT     Patient Active Problem List   Diagnosis Date Noted   History of CVA with residual deficit 11/25/2023   CHF (congestive heart failure) (HCC) 03/25/2023   Left-sided nontraumatic intracerebral hemorrhage (HCC) 08/01/2022   AVM (arteriovenous malformation) brain 07/24/2022   Asthma 07/24/2022   Major depressive disorder, recurrent episode, moderate (HCC) 02/07/2022   Sepsis secondary to UTI (HCC) 05/01/2020   VP  (ventriculoperitoneal) shunt status 05/01/2020   Right ovarian cyst 05/01/2020   Elevated LFTs 05/01/2020    ONSET DATE: September  2023  REFERRING DIAG:  Diagnosis  I69.30 (ICD-10-CM) - History of CVA with residual deficit    THERAPY DIAG:  Muscle weakness (generalized)  Other lack of coordination  Difficulty in walking, not elsewhere classified  Unsteadiness on feet  History of CVA with residual deficit  Other abnormalities of gait and mobility  Balance disorder  Rationale for Evaluation and Treatment: Rehabilitation  SUBJECTIVE:  SUBJECTIVE STATEMENT:  Pt reports 1-2/10 dull pain in the R knee upon arrival.  Pt notes increased pain upon waking after sleeping, but just dull pain at this time.  Pt accompanied by: self  PERTINENT HISTORY:  From recent MD visit:  [name] presents to follow up on chronic medical conditions. Having some knee pain today, had been doing well with PT but her insurance is no longer paying for this until the new year.    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  Also on Lyrica 50 mg BID  -Difficulty using using right hand and right side of face - completed home PT/OT but interested in continuing with therapy outside the home.  Hx of CHF as a newborn: -Had been following with Cardiology at [facility], last seen in 2018 -Last echo 9/23 EF 58% -Denies chest pain, palpitations, shortness of breath or lower extremity  swelling  PAIN:  Are you having pain? Yes: NPRS scale: 4/10 Pain location: R shoulder  Pain description: dull pain  Aggravating factors: moving arm  Relieving factors: n/a   PRECAUTIONS: Fall  RED FLAGS: None   WEIGHT BEARING RESTRICTIONS: No  FALLS: Has patient fallen in last 6 months? No  LIVING ENVIRONMENT: Lives with: lives with their family Lives in: House/apartment Stairs: Yes: External: 2 steps; on right going up and on left going up Has following equipment at home: Single point cane  PLOF: Independent, Independent with basic ADLs, and prior to CVA   PATIENT GOALS: improve walking. "Get around better"   OBJECTIVE:  Note: Objective measures were completed at Evaluation unless otherwise noted.  DIAGNOSTIC FINDINGS:   CT 2023 IMPRESSION: 1. 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 Again, prominent arterial contribution to the AVM is seen from the left ACA and posterior circulation, with primary venous drainage into the deep venous system. Associated 7 mm aneurysm along the right anterolateral aspect of the AVM as above. 2. Diffuse tortuosity and ectasia elsewhere about the major arterial vasculature of the head and neck. No large vessel occlusion or hemodynamically significant stenosis. No other acute vascular abnormality.    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   EDEMA:  Circumferential:  distal to knee in sitting R: 41.6CM L 39cm  MUSCLE TONE: RLE: Mild noted increased tone with gait and mobility.   DTRs:  Patella 1 = Trace   POSTURE: rounded shoulders, forward head, and left pelvic obliquity  LOWER EXTREMITY ROM:     Active  Right Eval Left Eval  Hip flexion [facility]  Hip extension [facility]  Hip abduction    Hip  adduction    Hip internal rotation    Hip external rotation    Knee flexion 110 WFL  Knee extension [facility]  Ankle dorsiflexion 8 15  Ankle plantarflexion [facility]  Ankle inversion    Ankle eversion     (Blank rows = not tested)  LOWER EXTREMITY  MMT:    MMT Right Eval Left Eval  Hip flexion 4 4+  Hip extension    Hip abduction 5 5  Hip adduction 5 5  Hip internal rotation    Hip external rotation    Knee flexion 4+ 5  Knee extension 5 5  Ankle dorsiflexion 4 5  Ankle plantarflexion    Ankle inversion    Ankle eversion    (Blank rows = not tested)  BED MOBILITY:  Sit to supine SBA Supine to sit SBA Rolling to Right SBA Rolling to Left SBA  TRANSFERS: Assistive device utilized: None  Sit to stand: SBA Stand to sit: SBA Chair to chair: SBA Floor:  need to assess    CURB:  Level of Assistance: SBA Assistive device utilized:  rail  Curb Comments: step to descent   STAIRS: Level of Assistance: SBA Stair Negotiation Technique: Step to Pattern with descent and Single Rail on Left Number of Stairs: 4  Height of Stairs: 6  Comments: mild posterior bias on descent. And step to pattern   GAIT: Gait pattern: step through pattern, decreased stance time- Right, Right hip hike, trunk rotated posterior- Left, wide BOS, and poor foot clearance- Right Distance walked: 60 Assistive device utilized: None Level of assistance: SBA Comments: see description  FUNCTIONAL TESTS:  5 times sit to stand: 12.41sec Timed up and go (TUG): 10.94 6 minute walk test: to be complete  10 meter walk test: 0.105m/s  Berg Balance Scale: 50 Functional gait assessment: 20  PATIENT SURVEYS:  ABC scale 48.75                                                                                                                              TREATMENT DATE: 01/29/2024   TherAct:  Patient ambulated 25 min indoor/outdoor today- including tile, concrete, carpet, horizontal head turns, on varied  surfaces: concrete, brick, asphalt, pine needles, grass, up ramp, inclines, declines- with 0 seated rest break(s) - CGA for all indoor/outdoor - 0 instancse of instability when ambulating over mulch - Focusing on overall endurance and to improve ambulation across uneven terrain that pt would come in contact with in the community.  TherEx:  Standing hip abduction with 5# AW donned, 2x10 Standing squats with UE support on // bar, 2x10 Standing hip extensions with 5# AW donned with UE support on // bar, 2x10 Lateral walking with RTB around forefoot, 10; each direction    CGA provided by PT throughout session unless otherwise noted.    PATIENT EDUCATION: Education details: POC.  Person educated: Patient Education method: Medical illustrator Education comprehension: verbalized understanding  HOME EXERCISE PROGRAM: Access Code: [email or web address] URL: [email or web address] Date: 02/05/2024 Prepared by: Grier Rocher  Exercises - Seated March with Ankle Weights at Foot  - 1 x daily - 7 x weekly - 3 sets - 10 reps - Side Stepping with Resistance at Feet  - 1 x daily - 7 x weekly - 3  sets - 10 reps - Tandem Stance  - 1 x daily - 5 x weekly - 3 sets - 4 reps - 20 hold - Staggered Sit-to-Stand  - 1 x daily - 5 x weekly - 2 sets - 5 reps - Sit to Stand with Arms Crossed  - 1 x daily - 5 x weekly - 3 sets - 10 reps - Seated Knee Extension with Resistance  - 1 x daily - 7 x weekly - 3 sets - 10 reps - Seated Hip Abduction with Resistance  - 1 x daily - 7 x weekly - 3 sets - 10 reps  GOALS: Goals reviewed with patient? Yes  SHORT TERM GOALS: Target date: 02/27/2024  Patient will be independent in home exercise program to improve strength/mobility for better functional independence with ADLs. Baseline: to be given on visit 2  Goal status: INITIAL  LONG TERM GOALS: Target date: 04/23/2024  Patient will increase ABC score to equal to or greater than   10%  to demonstrate  statistically significant improvement in mobility and quality of life.  Baseline: 48.75 Goal status: INITIAL  2.  Patient (> 106 years old) will complete five times sit to stand test in < 11 seconds indicating an increased LE strength and improved balance. Baseline: 12.41sec Goal status: INITIAL  3.  Patient will increase 6 min walk test by >142ft points to demonstrate decreased fall risk during functional activities Baseline: 953ft without AD. No rest break needed.  Goal status: INITIAL  4.  Patient will increase 10 meter walk test to >1.11m/s as to improve gait speed for better community ambulation and to reduce fall risk. Baseline: 0.71m/s Goal status: INITIAL  5.  Patient will reduce timed up and go to <10 seconds to reduce fall risk and demonstrate improved transfer/gait ability. Baseline: 10.94 Goal status: INITIAL  6.  Patient will increase dynamic gait index score to >24 as to demonstrate reduced fall risk and improved dynamic gait balance for better safety with community/home ambulation.   Baseline: 20 Goal status: INITIAL   ASSESSMENT:  CLINICAL IMPRESSION:  Pt put forth good effort throughout the session and was able to safely navigate ambulation outside across varying surfaces.  Pt denied any complication, however needed some verbal cuing for foot clearance bilaterally, more on the R than the L today.  Pt otherwise performed well.  Pt did have an instance of cramping in the hip during exercises, with pt continuing to perform and strengthen the hip.   Pt will continue to benefit from skilled therapy to address remaining deficits in order to improve overall QoL and return to PLOF.     OBJECTIVE IMPAIRMENTS: Abnormal gait, cardiopulmonary status limiting activity, decreased activity tolerance, decreased balance, decreased coordination, decreased endurance, decreased knowledge of use of DME, decreased mobility, difficulty walking, decreased ROM, decreased strength,  hypomobility, increased edema, increased fascial restrictions, increased muscle spasms, impaired flexibility, impaired sensation, impaired tone, impaired UE functional use, improper body mechanics, postural dysfunction, and pain.   ACTIVITY LIMITATIONS: carrying, lifting, bending, squatting, stairs, transfers, bed mobility, dressing, and locomotion level  PARTICIPATION LIMITATIONS: cleaning, laundry, interpersonal relationship, driving, shopping, community activity, occupation, and yard work  PERSONAL FACTORS: 1-2 comorbidities: hs of CHF and CVA  are also affecting patient's functional outcome.   REHAB POTENTIAL: Good  CLINICAL DECISION MAKING: Evolving/moderate complexity  EVALUATION COMPLEXITY: Moderate  PLAN:  PT FREQUENCY: 1-2x/week  PT DURATION: 12 weeks  PLANNED INTERVENTIONS: 97110-Therapeutic exercises, 97530- Therapeutic activity, [id number]- Neuromuscular re-education, 97535- Self Care,  [id number]- Manual therapy, [id number]- Gait training, 60454- Orthotic Fit/training, [id number]- Splinting, [phone]- Electrical stimulation (unattended), [phone]- Electrical stimulation (manual), Patient/Family education, Balance training, Stair training, Taping, Dry Needling, Joint mobilization, Joint manipulation, Spinal manipulation, Spinal mobilization, Vestibular training, Visual/preceptual remediation/compensation, DME instructions, Cryotherapy, and Moist heat  PLAN FOR NEXT SESSION:   Dynamic balance training. R ankle and gluteal strengthening    Nolon Bussing, PT, DPT Physical Therapist - [facility]  02/12/24, 3:38 PM

## 2024-02-14 DIAGNOSIS — Z419 Encounter for procedure for purposes other than remedying health state, unspecified: Secondary | ICD-10-CM | POA: Diagnosis not present

## 2024-02-19 ENCOUNTER — Ambulatory Visit: Payer: Medicaid Other | Admitting: Physical Therapy

## 2024-02-19 ENCOUNTER — Ambulatory Visit: Payer: Medicaid Other

## 2024-02-19 DIAGNOSIS — R278 Other lack of coordination: Secondary | ICD-10-CM

## 2024-02-19 DIAGNOSIS — R2681 Unsteadiness on feet: Secondary | ICD-10-CM | POA: Diagnosis not present

## 2024-02-19 DIAGNOSIS — R2689 Other abnormalities of gait and mobility: Secondary | ICD-10-CM

## 2024-02-19 DIAGNOSIS — I6930 Unspecified sequelae of cerebral infarction: Secondary | ICD-10-CM

## 2024-02-19 DIAGNOSIS — M6281 Muscle weakness (generalized): Secondary | ICD-10-CM

## 2024-02-19 DIAGNOSIS — R262 Difficulty in walking, not elsewhere classified: Secondary | ICD-10-CM

## 2024-02-19 DIAGNOSIS — Z7689 Persons encountering health services in other specified circumstances: Secondary | ICD-10-CM | POA: Diagnosis not present

## 2024-02-19 NOTE — Therapy (Signed)
 OUTPATIENT OCCUPATIONAL THERAPY NEURO TREATMENT NOTE  Patient Name: Anna Tapia MRN: [id number] DOB:1991/02/27, 33 y.o., female Today's Date: 02/19/2024  PCP: Dr. Rockney Cid  REFERRING PROVIDER: Dr. Rockney Cid  END OF SESSION:  OT End of Session - 02/19/24 1508     Visit Number 3    Number of Visits 24    Date for OT Re-Evaluation 04/22/24    Authorization Type [employer]; [employer] required    Progress Note Due on Visit 10    OT Start Time 1445    OT Stop Time 1530    OT Time Calculation (min) 45 min    Activity Tolerance Patient tolerated treatment well    Behavior During Therapy WFL for tasks assessed/performed            Past Medical History:  Diagnosis Date   Arteriovenous malformation of brain    a. s/p remote coiling.   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   Congenital CHF (congestive heart failure) (HCC)    a. 03/2023 Echo: EF 60-65%, no rwma, nl RV fxn, RVSP 29.40mmHg. Mild MR. Mild Ao sclerosis w/o stenosis. Ao root 38mm.   COVID 2022   Hemorrhagic stroke (HCC) 2023   a. L-sided midbrain, thalamic ICH in setting of PCA/ACA aneurysm s/p embolization.   History of broken collarbone 2020   Hydrocephalus (HCC)    a. s/p VP shunt in childhood.   Precordial chest pain    a. 03/2023 MV: No ischemia or infarct.  EF greater than 65%.  No significant coronary calcification.   Sepsis secondary to UTI (HCC) 2021   Past Surgical History:  Procedure Laterality Date   VENTRICULOPERITONEAL SHUNT     Patient Active Problem List   Diagnosis Date Noted   History of CVA with residual deficit 11/25/2023   CHF (congestive heart failure) (HCC) 03/25/2023   Left-sided nontraumatic intracerebral hemorrhage (HCC) 08/01/2022   AVM (arteriovenous malformation) brain 07/24/2022   Asthma 07/24/2022   Major depressive disorder, recurrent episode, moderate (HCC) 02/07/2022   Sepsis secondary to UTI (HCC) 05/01/2020   VP  (ventriculoperitoneal) shunt status 05/01/2020   Right ovarian cyst 05/01/2020   Elevated LFTs 05/01/2020    ONSET DATE: Sept 2023  REFERRING DIAG: I69.30 (ICD-10-CM) - History of CVA with residual deficit   THERAPY DIAG:  Muscle weakness (generalized)  Other lack of coordination  History of CVA with residual deficit  Rationale for Evaluation and Treatment: Rehabilitation  SUBJECTIVE:  SUBJECTIVE STATEMENT: Pt reports that she bought some Ktape from the drugstore and brought it with her to therapy to make sure it was what she needed for her hand. Pt accompanied by: self  PERTINENT HISTORY: Pt known to this clinic after participation in therapy in 2024 following her L CVA in 2023.  Pt was discharged from OT in Oct of 2024 d/t visit limitations with therapy.  Pt returns today to address RUE residual deficits from CVA after most recent Botox injections on 01/21/24.  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 Yes: NPRS scale: 4-5/10 R shoulder from recent Botox  Pain location: R shoulder  Pain description: achy Aggravating factors: Botox shot Relieving  factors: heat, rest  FALLS: Has patient fallen in last 6 months? No  LIVING ENVIRONMENT: Lives with: lives with their family mother and 3 brothers, but mostly staying with her aunt  Lives in: ground level apartment with aunt  Stairs: Yes: External: 3 steps; bilateral but cannot reach both Has following equipment at home: Single point cane  PLOF: Independent prior to CVA  PATIENT GOALS: "Using my arm more."   OBJECTIVE:  Note: Objective measures were completed at Evaluation unless otherwise noted.  HAND DOMINANCE: Left  ADLs: Overall ADLs:  Pt reports that she tries to engage the R hand into everything she does Transfers/ambulation related to ADLs: indep Eating: cuts food with L hand Grooming: increased time to engage the R  UB Dressing: Increased time with clothing fasteners LB Dressing: wears slip on shoes; wore jeans today and did ok with the clothing fasteners, extra time  Toileting: indep Bathing: occasional pain when reaching over with the R arm to bathe the L arm (R pec and shoulder pain) Tub Shower transfers: modified indep Equipment: Shower seat with back, Grab bars, and hand held shower hose   IADLs: Shopping: pt can go shopping accompanied by a family member  Light housekeeping: pt reports unable to sweep; pt reports she gets dizzy  Meal Prep: Pt reports she now cooks about 3 nights a week, but has difficulty opening containers/jars/food packages in the [employer] mobility: uses cane Medication management: modified indep; increased time to engage the R hand Financial management: mother manages (pt states she doesn't really have any bills)  Handwriting:  NT (pt is L hand dominant)  MOBILITY STATUS:  Uses cane for longer distances, but did not bring cane today  POSTURE COMMENTS:  rounded shoulders Sitting balance: Moves/returns truncal midpoint >2 inches in all planes  ACTIVITY TOLERANCE: Activity tolerance: Pt fatigues with IADLs and community mobility  FUNCTIONAL OUTCOME MEASURES: Upper Extremity Functional Scale (UEFS): TBD  UPPER EXTREMITY ROM:    Active ROM Left  Eval WNL throughout Right OT d/c on 08/12/23 (Previous episode of care) Right Eval on 01/29/24    Shoulder flexion  133 (135) 125 (134)   Shoulder abduction  120 (125) 90 (104)   Shoulder adduction      Shoulder extension      Shoulder internal rotation  R thumb to lumbar spine (better clearance of SI joint) To R side of lower back-good clearance of SI joint   Shoulder external rotation  Hand to back of head without chin tuck and  good abd  63* with arm abd (Slight scaption)   Elbow flexion      Elbow extension      Wrist flexion      Wrist extension      Wrist ulnar deviation      Wrist radial deviation      Wrist pronation      Wrist supination      (Blank rows = not tested)  UPPER EXTREMITY MMT:     MMT Left Eval 5/5 throughout Right OT d/c on 08/12/23 (Previous episode of care) Right Eval 01/29/24   Shoulder flexion  4- 4   Shoulder abduction  4- 4   Shoulder adduction      Shoulder extension      Shoulder internal rotation  4 4   Shoulder external rotation  4- 4   Middle trapezius      Lower trapezius      Elbow flexion  4+ 4+   Elbow  extension  5 5   Wrist flexion  4+ 4+   Wrist extension  4+ 4+   Wrist ulnar deviation      Wrist radial deviation      Wrist pronation      Wrist supination      (Blank rows = not tested)  HAND FUNCTION: 08/12/23: Grip strength: Right: 14 lbs, Left 60 lbs; lateral pinch: Right: 6 lbs, Left: 16 lbs , 3 point pinch: Right: 5 (IP flexion; Saehan pinch gauge), Left: 19 lbs  01/29/24 Eval: Grip strength: Right: 18 lbs; Left: 55 lbs, Lateral pinch: Right: 5 lbs, Left: 14 lbs, and 3 point pinch: Right:  Fingers slip- standard pinch gauge used 2 lbs, Left: 15 lbs  COORDINATION: 08/12/23: Right: 5 min and 19 sec; 2nd trial 3 min 49 sec, Left 23 sec,  01/29/24 Eval: 9 Hole Peg test: Right: 2 min 41 sec  sec; Left: 23 sec  SENSATION: Light touch: Impaired , tingling in the R hand   EDEMA: No visible edema  MUSCLE TONE: RUE: Mild and Moderate (Last round of Botox on 01/21/24)  COGNITION: Overall cognitive status: Within functional limits for tasks assessed  VISION: WFL; no recent changes  PERCEPTION: WFL  PRAXIS: Impaired: Motor planning and Clonus R hand  OBSERVATIONS:  Pt remains very motivated to increase functional use of RUE for daily tasks.                                                                                                               TREATMENT DATE: 02/19/24 Neuro re-ed: -Facilitated R hand lateral, tip pinch, and 3 point pinch patterns working to place small pegs into pegboard; intermittent visual demo and tactile cues to formulate above noted pinch patterns  Therapeutic Activity: -Applied Ktape to dorsal thumb to promote R thumb IP ext, reducing flexor synergy during lateral and 3 point pinching activities; reviewed self application strategies for carryover at home -Facilitated forward and lateral reaching patterns reaching reaching for objects on table top with RUE; min vc to reduce proximal substitution patterns -Facilitated use of R hand for scooping and releasing handful of pegs, given vc for increasing end range digit extension upon release   PATIENT EDUCATION: Education details: [employer]chant [employer] officer Person educated: Patient Education method: Programmer, multimedia, demo Education comprehension: verbalized understanding  HOME EXERCISE PROGRAM: Pink theraputty  GOALS: Goals reviewed with patient? Yes  SHORT TERM GOALS: Target date: 03/11/24  Pt will be indep to perform HEP for improving R hand strength and coordination for daily tasks. Baseline: Eval: Not yet initiated Goal status: INITIAL   LONG TERM GOALS: Target date: 04/22/24  Pt will increase UEFS by 10 or more points to indicate self perceived functional improvement when engaging the RUE into daily tasks. Baseline: Eval: TBD Goal status: INITIAL  2.  Pt will increase R grip strength by 5 or more lbs to securely stabilize containers and jars for easier opening.  Baseline: Eval: R grip strength 18 lbs (L 55 lbs) Goal status: INITIAL  3.  Pt will  increase R hand dexterity/FMC skills to manipulate clothing fasteners with bilat hands.  Baseline:  Goal status: INITIAL  4.  Pt will increase R active shoulder flexion to 140* or better to achieve functional ROM for UB ADLs and reaching for ADL supplies.  Baseline: Eval: R shoulder flexion 125*; difficulty reaching  overhead for ADL supplies Goal status: INITIAL  ASSESSMENT:  CLINICAL IMPRESSION: Pt pleased with benefits of Ktape to promote R thumb IP ext last session and bought her own Ktape for carryover at home.  OT applied tape today and reviewed self application strategies.  Pt able to perform forward and lateral reaching patterns with the RUE with less cueing today to reduce proximal substitution patterns.  Following high reps of 3 point pinch and tactile cues to initiate positioning, pt was able to briefly sustain a 3 point pinch pattern on a small peg while removing from board on at least 3 reps, as compared to usual slipping of R LF from grasp position.  Pt continues to present with RUE pain, stiffness in the shoulder, muscle weakness, and lack of coordination, including clonus in R hand.  Pt will benefit from skilled OT to address RUE deficits in order to improve indep and efficiency when engaging the RUE into daily tasks.    PERFORMANCE DEFICITS: in functional skills including ADLs, IADLs, coordination, dexterity, sensation, tone, ROM, strength, pain, flexibility, Fine motor control, Gross motor control, mobility, balance, body mechanics, endurance, decreased knowledge of use of DME, and UE functional use, and psychosocial skills including coping strategies, environmental adaptation, habits, and routines and behaviors.   IMPAIRMENTS: are limiting patient from ADLs, IADLs, work, leisure, and social participation.   CO-MORBIDITIES: has co-morbidities such as CHF, asthma, depression  that affects occupational performance. Patient will benefit from skilled OT to address above impairments and improve overall function.  MODIFICATION OR ASSISTANCE TO COMPLETE EVALUATION: No modification of tasks or assist necessary to complete an evaluation.  OT OCCUPATIONAL PROFILE AND HISTORY: Detailed assessment: Review of records and additional review of physical, cognitive, psychosocial history related to current  functional performance.  CLINICAL DECISION MAKING: Moderate - several treatment options, min-mod task modification necessary  REHAB POTENTIAL: Good  EVALUATION COMPLEXITY: Moderate    PLAN:  OT FREQUENCY: 1-2x/week  OT DURATION: 12 weeks  PLANNED INTERVENTIONS: 97168 OT Re-evaluation, 97535 self care/ADL training, 16109 therapeutic exercise, 97530 therapeutic activity, 97112 neuromuscular re-education, 97140 manual therapy, 97010 moist heat, 97010 cryotherapy, passive range of motion, balance training, functional mobility training, psychosocial skills training, energy conservation, coping strategies training, patient/family education, and DME and/or AE instructions  RECOMMENDED OTHER SERVICES: Pt completed PT evaluation this date  CONSULTED AND AGREED WITH PLAN OF CARE: Patient  PLAN FOR NEXT SESSION: see above  Marcus Sewer, MS, OTR/L  Casandra Claw, OT 02/19/2024, 3:15 PM

## 2024-02-19 NOTE — Therapy (Signed)
 OUTPATIENT PHYSICAL THERAPY NEURO TREATMENT   Patient Name: Anna Tapia MRN: [id number] DOB:12-Sep-1991, 33 y.o., female Today's Date: 02/19/2024   PCP: Rockney Cid, DO  REFERRING PROVIDER: Rockney Cid, DO   END OF SESSION:  PT End of Session - 02/19/24 1408     Visit Number 4    Number of Visits 24    Date for PT Re-Evaluation 04/22/24    PT Start Time 1406    PT Stop Time 1445    PT Time Calculation (min) 39 min    Equipment Utilized During Treatment Gait belt    Activity Tolerance Patient tolerated treatment well;No increased pain    Behavior During Therapy WFL for tasks assessed/performed             Past Medical History:  Diagnosis Date   Arteriovenous malformation of brain    a. s/p remote coiling.   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   Congenital CHF (congestive heart failure) (HCC)    a. 03/2023 Echo: EF 60-65%, no rwma, nl RV fxn, RVSP 29.58mmHg. Mild MR. Mild Ao sclerosis w/o stenosis. Ao root 38mm.   COVID 2022   Hemorrhagic stroke (HCC) 2023   a. L-sided midbrain, thalamic ICH in setting of PCA/ACA aneurysm s/p embolization.   History of broken collarbone 2020   Hydrocephalus (HCC)    a. s/p VP shunt in childhood.   Precordial chest pain    a. 03/2023 MV: No ischemia or infarct.  EF greater than 65%.  No significant coronary calcification.   Sepsis secondary to UTI (HCC) 2021   Past Surgical History:  Procedure Laterality Date   VENTRICULOPERITONEAL SHUNT     Patient Active Problem List   Diagnosis Date Noted   History of CVA with residual deficit 11/25/2023   CHF (congestive heart failure) (HCC) 03/25/2023   Left-sided nontraumatic intracerebral hemorrhage (HCC) 08/01/2022   AVM (arteriovenous malformation) brain 07/24/2022   Asthma 07/24/2022   Major depressive disorder, recurrent episode, moderate (HCC) 02/07/2022   Sepsis secondary to UTI (HCC) 05/01/2020   VP  (ventriculoperitoneal) shunt status 05/01/2020   Right ovarian cyst 05/01/2020   Elevated LFTs 05/01/2020    ONSET DATE: September  2023  REFERRING DIAG:  Diagnosis  I69.30 (ICD-10-CM) - History of CVA with residual deficit    THERAPY DIAG:  Muscle weakness (generalized)  Other lack of coordination  History of CVA with residual deficit  Difficulty in walking, not elsewhere classified  Unsteadiness on feet  Other abnormalities of gait and mobility  Balance disorder  Rationale for Evaluation and Treatment: Rehabilitation  SUBJECTIVE:  SUBJECTIVE STATEMENT:  Pt reports that she was nervous that she would be late to PT due to transportation delay, but made it to PT in time for scheduled therapy. No pain. Reports that she is performing her HEP daily, because she really wants to "get back to the old me"   Pt accompanied by: self  PERTINENT HISTORY:  From recent MD visit:  Anna Tapia presents to follow up on chronic medical conditions. Having some knee pain today, had been doing well with PT but her insurance is no longer paying for this until the new year.    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  Also on Lyrica 50 mg BID  -Difficulty using using right hand and right side of face - completed home PT/OT but interested in continuing with therapy outside the home.  Hx of CHF as a newborn: -Had been following with Cardiology at [facility], last seen  in 2018 -Last echo 9/23 EF 58% -Denies chest pain, palpitations, shortness of breath or lower extremity swelling  PAIN:  Are you having pain? Yes: NPRS scale: 4/10 Pain location: R shoulder  Pain description: dull pain  Aggravating factors: moving arm  Relieving factors: n/a   PRECAUTIONS: Fall  RED FLAGS: None   WEIGHT BEARING RESTRICTIONS: No  FALLS: Has patient fallen in last 6 months? No  LIVING ENVIRONMENT: Lives with: lives with their family Lives in: House/apartment Stairs: Yes: External: 2 steps; on right going up and on left going up Has following equipment at home: Single point cane  PLOF: Independent, Independent with basic ADLs, and prior to CVA   PATIENT GOALS: improve walking. "Get around better"   OBJECTIVE:  Note: Objective measures were completed at Evaluation unless otherwise noted.  DIAGNOSTIC FINDINGS:   CT 2023 IMPRESSION: 1. 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 Again, prominent arterial contribution to the AVM is seen from the left ACA and posterior circulation, with primary venous drainage into the deep venous system. Associated 7 mm aneurysm along the right anterolateral aspect of the AVM as above. 2. Diffuse tortuosity and ectasia elsewhere about the major arterial vasculature of the head and neck. No large vessel occlusion or hemodynamically significant stenosis. No other acute vascular abnormality.    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   EDEMA:  Circumferential:  distal to knee in sitting R: 41.6CM L 39cm  MUSCLE TONE: RLE: Mild noted increased tone with gait and mobility.   DTRs:  Patella 1 = Trace   POSTURE: rounded shoulders, forward head, and left pelvic obliquity  LOWER EXTREMITY ROM:      Active  Right Eval Left Eval  Hip flexion [facility]  Hip extension [facility]  Hip abduction    Hip adduction    Hip internal rotation    Hip external rotation    Knee flexion 110 WFL  Knee extension [facility]  Ankle dorsiflexion 8 15  Ankle plantarflexion  WF WFL  Ankle inversion    Ankle eversion     (Blank rows = not tested)  LOWER EXTREMITY MMT:    MMT Right Eval Left Eval  Hip flexion 4 4+  Hip extension    Hip abduction 5 5  Hip adduction 5 5  Hip internal rotation    Hip external rotation    Knee flexion 4+ 5  Knee extension 5 5  Ankle dorsiflexion 4 5  Ankle plantarflexion    Ankle inversion    Ankle eversion    (Blank rows = not tested)  BED MOBILITY:  Sit to supine SBA Supine to sit SBA Rolling to Right SBA Rolling to Left SBA  TRANSFERS: Assistive device utilized: None  Sit to stand: SBA Stand to sit: SBA Chair to chair: SBA Floor:  need to assess    CURB:  Level of Assistance: SBA Assistive device utilized:  rail  Curb Comments: step to descent   STAIRS: Level of Assistance: SBA Stair Negotiation Technique: Step to Pattern with descent and Single Rail on Left Number of Stairs: 4  Height of Stairs: 6  Comments: mild posterior bias on descent. And step to pattern   GAIT: Gait pattern: step through pattern, decreased stance time- Right, Right hip hike, trunk rotated posterior- Left, wide BOS, and poor foot clearance- Right Distance walked: 60 Assistive device utilized: None Level of assistance: SBA Comments: see description  FUNCTIONAL TESTS:  5 times sit to stand: 12.41sec Timed up and go (TUG): 10.94 6 minute walk test: to be complete  10 meter walk test: 0.48m/s  Berg Balance Scale: 50 Functional gait assessment: 20  PATIENT SURVEYS:  ABC scale 48.75                                                                                                                              TREATMENT DATE: 01/29/2024   TherAct:  Standing on  airex pad normal BOS x 30 sec Standing with 1 foot on pad, 1 foot on 6 inch step 2 x 20 sec bil  Reciprocal foot tap on 6inch step from pad x 15 bil   Seated HS curl RTB x 15  LAQ x 15 bil RTB Hip flexion RTB x 12 bil  Hip extension RTB x 12 bil  Side stepping at rail with RTB x 6 laps.   Sit<>stand with hip ER and RTB at knees x 10 bil   SLS with light UE support 2 x 15 bil    Supervision assist- CGA provided by PT throughout session unless otherwise noted.    PATIENT EDUCATION: Education details: POC.  Person educated: Patient Education method: Medical illustrator Education comprehension: verbalized understanding  HOME EXERCISE PROGRAM: Access Code: [email or web address] URL: [email or web address] Date: 02/19/2024 Prepared by: Aurora Lees  Exercises - Seated March with Ankle Weights at Foot  - 1 x daily - 7 x weekly - 3 sets - 10 reps - Side Stepping with Resistance  at Feet  - 1 x daily - 7 x weekly - 3 sets - 10 reps - Tandem Stance  - 1 x daily - 5 x weekly - 3 sets - 4 reps - 20 hold - Staggered Sit-to-Stand  - 1 x daily - 5 x weekly - 2 sets - 5 reps - Sit to Stand with Arms Crossed  - 1 x daily - 5 x weekly - 3 sets - 10 reps - Seated Knee Extension with Resistance  - 1 x daily - 7 x weekly - 3 sets - 10 reps - Seated Hip Abduction with Resistance  - 1 x daily - 7 x weekly - 3 sets - 10 reps - Standing Single Leg Stance with Counter Support  - 1 x daily - 7 x weekly - 3 sets - 10 reps  GOALS: Goals reviewed with patient? Yes  SHORT TERM GOALS: Target date: 02/27/2024  Patient will be independent in home exercise program to improve strength/mobility for better functional independence with ADLs. Baseline: to be given on visit 2  Goal status: INITIAL  LONG TERM GOALS: Target date: 04/23/2024  Patient will increase ABC score to equal to or greater than   10%  to demonstrate statistically significant improvement in mobility and quality of life.   Baseline: 48.75 Goal status: INITIAL  2.  Patient (> 33 years old) will complete five times sit to stand test in < 11 seconds indicating an increased LE strength and improved balance. Baseline: 12.41sec Goal status: INITIAL  3.  Patient will increase 6 min walk test by >176ft points to demonstrate decreased fall risk during functional activities Baseline: 986ft without AD. No rest break needed.  Goal status: INITIAL  4.  Patient will increase 10 meter walk test to >1.76m/s as to improve gait speed for better community ambulation and to reduce fall risk. Baseline: 0.60m/s Goal status: INITIAL  5.  Patient will reduce timed up and go to <10 seconds to reduce fall risk and demonstrate improved transfer/gait ability. Baseline: 10.94 Goal status: INITIAL  6.  Patient will increase dynamic gait index score to >24 as to demonstrate reduced fall risk and improved dynamic gait balance for better safety with community/home ambulation.   Baseline: 20 Goal status: INITIAL   ASSESSMENT:  CLINICAL IMPRESSION:  Pt put forth good effort throughout the session and was able to safely navigate ambulation outside across varying surfaces. PT treatment focused on dynamic balance and strengthening. Increased resistance and repetitions for HEP as well as added SLS task for practice at home due to difficulty with R weight shift. Tolerated well, but required increased UE support when standing on the RLE.    Pt will continue to benefit from skilled therapy to address remaining deficits in order to improve overall QoL and return to PLOF.     OBJECTIVE IMPAIRMENTS: Abnormal gait, cardiopulmonary status limiting activity, decreased activity tolerance, decreased balance, decreased coordination, decreased endurance, decreased knowledge of use of DME, decreased mobility, difficulty walking, decreased ROM, decreased strength, hypomobility, increased edema, increased fascial restrictions, increased muscle spasms,  impaired flexibility, impaired sensation, impaired tone, impaired UE functional use, improper body mechanics, postural dysfunction, and pain.   ACTIVITY LIMITATIONS: carrying, lifting, bending, squatting, stairs, transfers, bed mobility, dressing, and locomotion level  PARTICIPATION LIMITATIONS: cleaning, laundry, interpersonal relationship, driving, shopping, community activity, occupation, and yard work  PERSONAL FACTORS: 1-2 comorbidities: hs of CHF and CVA  are also affecting patient's functional outcome.   REHAB POTENTIAL: Good  CLINICAL DECISION MAKING:  Evolving/moderate complexity  EVALUATION COMPLEXITY: Moderate  PLAN:  PT FREQUENCY: 1-2x/week  PT DURATION: 12 weeks  PLANNED INTERVENTIONS: 97110-Therapeutic exercises, 97530- Therapeutic activity, 97112- Neuromuscular re-education, 97535- Self Care, [id number]- Manual therapy, [phone]- Gait training, [phone]- Orthotic Fit/training, [phone]- Splinting, [phone]- Electrical stimulation (unattended), [phone]- Electrical stimulation (manual), Patient/Family education, Balance training, Stair training, Taping, Dry Needling, Joint mobilization, Joint manipulation, Spinal manipulation, Spinal mobilization, Vestibular training, Visual/preceptual remediation/compensation, DME instructions, Cryotherapy, and Moist heat  PLAN FOR NEXT SESSION:   Dynamic balance training. R ankle and gluteal strengthening   Aurora Lees PT, DPT  Physical Therapist - [facility]  [facility]  2:48 PM 02/19/24

## 2024-02-26 ENCOUNTER — Ambulatory Visit: Payer: Medicaid Other | Admitting: Physical Therapy

## 2024-02-26 ENCOUNTER — Telehealth: Payer: Self-pay

## 2024-02-26 ENCOUNTER — Encounter: Payer: Self-pay | Admitting: Physical Therapy

## 2024-02-26 ENCOUNTER — Ambulatory Visit: Payer: Medicaid Other

## 2024-02-26 DIAGNOSIS — I6930 Unspecified sequelae of cerebral infarction: Secondary | ICD-10-CM | POA: Diagnosis not present

## 2024-02-26 DIAGNOSIS — M6281 Muscle weakness (generalized): Secondary | ICD-10-CM

## 2024-02-26 DIAGNOSIS — Z7689 Persons encountering health services in other specified circumstances: Secondary | ICD-10-CM | POA: Diagnosis not present

## 2024-02-26 DIAGNOSIS — R2681 Unsteadiness on feet: Secondary | ICD-10-CM | POA: Diagnosis not present

## 2024-02-26 DIAGNOSIS — R278 Other lack of coordination: Secondary | ICD-10-CM | POA: Diagnosis not present

## 2024-02-26 DIAGNOSIS — R2689 Other abnormalities of gait and mobility: Secondary | ICD-10-CM | POA: Diagnosis not present

## 2024-02-26 DIAGNOSIS — R262 Difficulty in walking, not elsewhere classified: Secondary | ICD-10-CM

## 2024-02-26 NOTE — Telephone Encounter (Signed)
 Prior Auth on   fluticasone -salmeterol (ADVAIR) 100-50 MCG/ACT AEPB   Key [id number]

## 2024-02-26 NOTE — Therapy (Unsigned)
 OUTPATIENT PHYSICAL THERAPY NEURO TREATMENT   Patient Name: Anna Tapia MRN: [id number] DOB:1991-04-09, 33 y.o., female Today's Date: 02/26/2024   PCP: Anna Cid, DO  REFERRING PROVIDER: Rockney Cid, DO   END OF SESSION:  PT End of Session - 02/26/24 1344     Visit Number 5    Number of Visits 24    Date for PT Re-Evaluation 04/22/24    PT Start Time 1404    PT Stop Time 1444    PT Time Calculation (min) 40 min    Equipment Utilized During Treatment Gait belt    Activity Tolerance Patient tolerated treatment well;No increased pain    Behavior During Therapy WFL for tasks assessed/performed             Past Medical History:  Diagnosis Date   Arteriovenous malformation of brain    a. s/p remote coiling.   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   Congenital CHF (congestive heart failure) (HCC)    a. 03/2023 Echo: EF 60-65%, no rwma, nl RV fxn, RVSP 29.29mmHg. Mild MR. Mild Ao sclerosis w/o stenosis. Ao root 38mm.   COVID 2022   Hemorrhagic stroke (HCC) 2023   a. L-sided midbrain, thalamic ICH in setting of PCA/ACA aneurysm s/p embolization.   History of broken collarbone 2020   Hydrocephalus (HCC)    a. s/p VP shunt in childhood.   Precordial chest pain    a. 03/2023 MV: No ischemia or infarct.  EF greater than 65%.  No significant coronary calcification.   Sepsis secondary to UTI (HCC) 2021   Past Surgical History:  Procedure Laterality Date   VENTRICULOPERITONEAL SHUNT     Patient Active Problem List   Diagnosis Date Noted   History of CVA with residual deficit 11/25/2023   CHF (congestive heart failure) (HCC) 03/25/2023   Left-sided nontraumatic intracerebral hemorrhage (HCC) 08/01/2022   AVM (arteriovenous malformation) brain 07/24/2022   Asthma 07/24/2022   Major depressive disorder, recurrent episode, moderate (HCC) 02/07/2022   Sepsis secondary to UTI (HCC) 05/01/2020   VP  (ventriculoperitoneal) shunt status 05/01/2020   Right ovarian cyst 05/01/2020   Elevated LFTs 05/01/2020    ONSET DATE: September  2023  REFERRING DIAG:  Diagnosis  I69.30 (ICD-10-CM) - History of CVA with residual deficit    THERAPY DIAG:  Difficulty in walking, not elsewhere classified  Muscle weakness (generalized)  Other lack of coordination  History of CVA with residual deficit  Unsteadiness on feet  Other abnormalities of gait and mobility  Balance disorder  Rationale for Evaluation and Treatment: Rehabilitation  SUBJECTIVE:  SUBJECTIVE STATEMENT:  Pt reports that she is doing alright. No falls or stumbles since last PT. No pain reported at start of PT treatment. Feels like she has been walking a little better  Pt accompanied by: self  PERTINENT HISTORY:  From recent MD visit:  Anna Tapia presents to follow up on chronic medical conditions. Having some knee pain today, had been doing well with PT but her insurance is no longer paying for this until the new year.    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  Also on Lyrica 50 mg BID  -Difficulty using using right hand and right side of face - completed home PT/OT but interested in continuing with therapy outside the home.  Hx of CHF as a newborn: -Had been following with Cardiology at [facility], last seen in 2018 -Last echo 9/23 EF 58% -Denies chest pain, palpitations, shortness of  breath or lower extremity swelling  PAIN:  Are you having pain? Yes: NPRS scale: 4/10 Pain location: R shoulder  Pain description: dull pain  Aggravating factors: moving arm  Relieving factors: n/a   PRECAUTIONS: Fall  RED FLAGS: None   WEIGHT BEARING RESTRICTIONS: No  FALLS: Has patient fallen in last 6 months? No  LIVING ENVIRONMENT: Lives with: lives with their family Lives in: House/apartment Stairs: Yes: External: 2 steps; on right going up and on left going up Has following equipment at home: Single point cane  PLOF: Independent, Independent with basic ADLs, and prior to CVA   PATIENT GOALS: improve walking. "Get around better"   OBJECTIVE:  Note: Objective measures were completed at Evaluation unless otherwise noted.  DIAGNOSTIC FINDINGS:   CT 2023 IMPRESSION: 1. 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 Again, prominent arterial contribution to the AVM is seen from the left ACA and posterior circulation, with primary venous drainage into the deep venous system. Associated 7 mm aneurysm along the right anterolateral aspect of the AVM as above. 2. Diffuse tortuosity and ectasia elsewhere about the major arterial vasculature of the head and neck. No large vessel occlusion or hemodynamically significant stenosis. No other acute vascular abnormality.    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   EDEMA:  Circumferential:  distal to knee in sitting R: 41.6CM L 39cm  MUSCLE TONE: RLE: Mild noted increased tone with gait and mobility.   DTRs:  Patella 1 = Trace   POSTURE: rounded shoulders, forward head, and left pelvic obliquity  LOWER EXTREMITY ROM:     Active  Right Eval Left Eval  Hip flexion [facility]  Hip extension [facility]   Hip abduction    Hip adduction    Hip internal rotation    Hip external rotation    Knee flexion 110 WFL  Knee extension [facility]  Ankle dorsiflexion 8 15  Ankle plantarflexion [facility]  Ankle inversion    Ankle eversion     (Blank rows = not  tested)  LOWER EXTREMITY MMT:    MMT Right Eval Left Eval  Hip flexion 4 4+  Hip extension    Hip abduction 5 5  Hip adduction 5 5  Hip internal rotation    Hip external rotation    Knee flexion 4+ 5  Knee extension 5 5  Ankle dorsiflexion 4 5  Ankle plantarflexion    Ankle inversion    Ankle eversion    (Blank rows = not tested)  BED MOBILITY:  Sit to supine SBA Supine to sit SBA Rolling to Right SBA Rolling to Left SBA  TRANSFERS: Assistive device utilized: None  Sit to stand: SBA Stand to sit: SBA Chair to chair: SBA Floor:  need to assess    CURB:  Level of Assistance: SBA Assistive device utilized:  rail  Curb Comments: step to descent   STAIRS: Level of Assistance: SBA Stair Negotiation Technique: Step to Pattern with descent and Single Rail on Left Number of Stairs: 4  Height of Stairs: 6  Comments: mild posterior bias on descent. And step to pattern   GAIT: Gait pattern: step through pattern, decreased stance time- Right, Right hip hike, trunk rotated posterior- Left, wide BOS, and poor foot clearance- Right Distance walked: 60 Assistive device utilized: None Level of assistance: SBA Comments: see description  FUNCTIONAL TESTS:  5 times sit to stand: 12.41sec Timed up and go (TUG): 10.94 6 minute walk test: to be complete  10 meter walk test: 0.49m/s  Berg Balance Scale: 50 Functional gait assessment: 20  PATIENT SURVEYS:  ABC scale 48.75                                                                                                                              TREATMENT DATE: 01/29/2024    2.5# AW donned at start of session, and worn throughout entire session.   Seated:  Reciprocal hip  flexion x 12 bil  LAQ x 12 bil  Hip flexion/abduction over hurlde x 10 bil  HS curl RTB x 12   Standing:  Hip flexion reciprocal  x 12 bil  Hip extension reciprocal x 10 bil  HS curl x 12 bil  Reciprocal march x 10 bil with light UE support  Sit<>stand with SLS once in stance x 8 bil. Difficulty with lifting LLE due to difficulty with weight shift.  Forward partial lunge x 8 bil  Lateral lunge to bosu ball x 8 bil  Partial Forward lunge onto Bosu ball x 8   Weighted gait training x 313ft without assist. Cues for improved knee flexion with limb advancement. Noted increased circumduction with fatigue.   Min cues for safety for proper step length as well as full weight shift to increase step height on the LLE and increase glute activation. Supervision assist- CGA provided by PT throughout session unless otherwise noted.    PATIENT EDUCATION: Education details: POC.  Person educated: Patient Education method: Psychiatrist  comprehension: verbalized understanding  HOME EXERCISE PROGRAM: Access Code: [email or web address] URL: [email or web address] Date: 02/19/2024 Prepared by: Aurora Lees  Exercises - Seated March with Ankle Weights at Foot  - 1 x daily - 7 x weekly - 3 sets - 10 reps - Side Stepping with Resistance at Feet  - 1 x daily - 7 x weekly - 3 sets - 10 reps - Tandem Stance  - 1 x daily - 5 x weekly - 3 sets - 4 reps - 20 hold - Staggered Sit-to-Stand  - 1 x daily - 5 x weekly - 2 sets - 5 reps - Sit to Stand with Arms Crossed  - 1 x daily - 5 x weekly - 3 sets - 10 reps - Seated Knee Extension with Resistance  - 1 x daily - 7 x weekly - 3 sets - 10 reps - Seated Hip Abduction with Resistance  - 1 x daily - 7 x weekly - 3 sets - 10 reps - Standing Single Leg Stance with Counter Support  - 1 x daily - 7 x weekly - 3 sets - 10 reps  GOALS: Goals reviewed with patient? Yes  SHORT TERM GOALS: Target date: 02/27/2024  Patient will be  independent in home exercise program to improve strength/mobility for better functional independence with ADLs. Baseline: to be given on visit 2  Goal status: INITIAL  LONG TERM GOALS: Target date: 04/23/2024  Patient will increase ABC score to equal to or greater than   10%  to demonstrate statistically significant improvement in mobility and quality of life.  Baseline: 48.75 Goal status: INITIAL  2.  Patient (> 49 years old) will complete five times sit to stand test in < 11 seconds indicating an increased LE strength and improved balance. Baseline: 12.41sec Goal status: INITIAL  3.  Patient will increase 6 min walk test by >12ft points to demonstrate decreased fall risk during functional activities Baseline: 974ft without AD. No rest break needed.  Goal status: INITIAL  4.  Patient will increase 10 meter walk test to >1.40m/s as to improve gait speed for better community ambulation and to reduce fall risk. Baseline: 0.40m/s Goal status: INITIAL  5.  Patient will reduce timed up and go to <10 seconds to reduce fall risk and demonstrate improved transfer/gait ability. Baseline: 10.94 Goal status: INITIAL  6.  Patient will increase dynamic gait index score to >24 as to demonstrate reduced fall risk and improved dynamic gait balance for better safety with community/home ambulation.   Baseline: 20 Goal status: INITIAL   ASSESSMENT:  CLINICAL IMPRESSION:  Pt put forth good effort throughout the session and was able to safely navigate ambulation outside across varying surfaces. PT treatment focused on dynamic balance and BLE strengthening. Instruction for full weight shift to increase stance on the RLE as well as improved knee flexion with gait.  Tolerated well, but required increased UE support when standing on the RLE.    Pt will continue to benefit from skilled therapy to address remaining deficits in order to improve overall QoL and return to PLOF.     OBJECTIVE IMPAIRMENTS:  Abnormal gait, cardiopulmonary status limiting activity, decreased activity tolerance, decreased balance, decreased coordination, decreased endurance, decreased knowledge of use of DME, decreased mobility, difficulty walking, decreased ROM, decreased strength, hypomobility, increased edema, increased fascial restrictions, increased muscle spasms, impaired flexibility, impaired sensation, impaired tone, impaired UE functional use, improper body mechanics, postural dysfunction, and pain.   ACTIVITY LIMITATIONS: carrying, lifting, bending, squatting, stairs, transfers, bed  mobility, dressing, and locomotion level  PARTICIPATION LIMITATIONS: cleaning, laundry, interpersonal relationship, driving, shopping, community activity, occupation, and yard work  PERSONAL FACTORS: 1-2 comorbidities: hs of CHF and CVA  are also affecting patient's functional outcome.   REHAB POTENTIAL: Good  CLINICAL DECISION MAKING: Evolving/moderate complexity  EVALUATION COMPLEXITY: Moderate  PLAN:  PT FREQUENCY: 1-2x/week  PT DURATION: 12 weeks  PLANNED INTERVENTIONS: 97110-Therapeutic exercises, 97530- Therapeutic activity, 97112- Neuromuscular re-education, 97535- Self Care, [id number]- Manual therapy, [phone]- Gait training, [phone]- Orthotic Fit/training, [phone]- Splinting, [phone]- Electrical stimulation (unattended), [phone]- Electrical stimulation (manual), Patient/Family education, Balance training, Stair training, Taping, Dry Needling, Joint mobilization, Joint manipulation, Spinal manipulation, Spinal mobilization, Vestibular training, Visual/preceptual remediation/compensation, DME instructions, Cryotherapy, and Moist heat  PLAN FOR NEXT SESSION:   Continue Dynamic balance training. R ankle and gluteal strengthening   Aurora Lees PT, DPT  Physical Therapist - [facility]  [facility]  1:49 PM 02/26/24

## 2024-02-26 NOTE — Telephone Encounter (Signed)
 Prior auth on   fluticasone -salmeterol (ADVAIR) 100-50 MCG/ACT AEPB

## 2024-02-27 NOTE — Therapy (Signed)
 OUTPATIENT OCCUPATIONAL THERAPY NEURO TREATMENT NOTE  Patient Name: Anna Tapia MRN: [id number] DOB:Jul 09, 1991, 33 y.o., female Today's Date: 02/27/2024  PCP: Dr. Rockney Cid  REFERRING PROVIDER: Dr. Rockney Cid  END OF SESSION:  OT End of Session - 02/27/24 1140     Visit Number 4    Number of Visits 24    Date for OT Re-Evaluation 04/22/24    Authorization Type [employer]; [employer] required    Progress Note Due on Visit 10    OT Start Time 1445    OT Stop Time 1530    OT Time Calculation (min) 45 min    Activity Tolerance Patient tolerated treatment well    Behavior During Therapy WFL for tasks assessed/performed            Past Medical History:  Diagnosis Date   Arteriovenous malformation of brain    a. s/p remote coiling.   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   Congenital CHF (congestive heart failure) (HCC)    a. 03/2023 Echo: EF 60-65%, no rwma, nl RV fxn, RVSP 29.4mmHg. Mild MR. Mild Ao sclerosis w/o stenosis. Ao root 38mm.   COVID 2022   Hemorrhagic stroke (HCC) 2023   a. L-sided midbrain, thalamic ICH in setting of PCA/ACA aneurysm s/p embolization.   History of broken collarbone 2020   Hydrocephalus (HCC)    a. s/p VP shunt in childhood.   Precordial chest pain    a. 03/2023 MV: No ischemia or infarct.  EF greater than 65%.  No significant coronary calcification.   Sepsis secondary to UTI (HCC) 2021   Past Surgical History:  Procedure Laterality Date   VENTRICULOPERITONEAL SHUNT     Patient Active Problem List   Diagnosis Date Noted   History of CVA with residual deficit 11/25/2023   CHF (congestive heart failure) (HCC) 03/25/2023   Left-sided nontraumatic intracerebral hemorrhage (HCC) 08/01/2022   AVM (arteriovenous malformation) brain 07/24/2022   Asthma 07/24/2022   Major depressive disorder, recurrent episode, moderate (HCC) 02/07/2022   Sepsis secondary to UTI (HCC) 05/01/2020   VP  (ventriculoperitoneal) shunt status 05/01/2020   Right ovarian cyst 05/01/2020   Elevated LFTs 05/01/2020    ONSET DATE: Sept 2023  REFERRING DIAG: I69.30 (ICD-10-CM) - History of CVA with residual deficit   THERAPY DIAG:  Muscle weakness (generalized)  Other lack of coordination  History of CVA with residual deficit  Rationale for Evaluation and Treatment: Rehabilitation  SUBJECTIVE:  SUBJECTIVE STATEMENT: Pt reports that she brought her putty back in to review exercises with OT today. Pt accompanied by: self  PERTINENT HISTORY: Pt known to this clinic after participation in therapy in 2024 following her L CVA in 2023.  Pt was discharged from OT in Oct of 2024 d/t visit limitations with therapy.  Pt returns today to address RUE residual deficits from CVA after most recent Botox injections on 01/21/24.  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 Yes: NPRS scale: 4-5/10 R shoulder from recent Botox  Pain location: R shoulder  Pain description: achy Aggravating factors: Botox shot Relieving factors: heat, rest  FALLS: Has patient fallen in  last 6 months? No  LIVING ENVIRONMENT: Lives with: lives with their family mother and 3 brothers, but mostly staying with her aunt  Lives in: ground level apartment with aunt  Stairs: Yes: External: 3 steps; bilateral but cannot reach both Has following equipment at home: Single point cane  PLOF: Independent prior to CVA  PATIENT GOALS: "Using my arm more."   OBJECTIVE:  Note: Objective measures were completed at Evaluation unless otherwise noted.  HAND DOMINANCE: Left  ADLs: Overall ADLs: Pt reports that she tries to engage the  R hand into everything she does Transfers/ambulation related to ADLs: indep Eating: cuts food with L hand Grooming: increased time to engage the R  UB Dressing: Increased time with clothing fasteners LB Dressing: wears slip on shoes; wore jeans today and did ok with the clothing fasteners, extra time  Toileting: indep Bathing: occasional pain when reaching over with the R arm to bathe the L arm (R pec and shoulder pain) Tub Shower transfers: modified indep Equipment: Shower seat with back, Grab bars, and hand held shower hose   IADLs: Shopping: pt can go shopping accompanied by a family member  Light housekeeping: pt reports unable to sweep; pt reports she gets dizzy  Meal Prep: Pt reports she now cooks about 3 nights a week, but has difficulty opening containers/jars/food packages in the [employer] mobility: uses cane Medication management: modified indep; increased time to engage the R hand Financial management: mother manages (pt states she doesn't really have any bills)  Handwriting:  NT (pt is L hand dominant)  MOBILITY STATUS:  Uses cane for longer distances, but did not bring cane today  POSTURE COMMENTS:  rounded shoulders Sitting balance: Moves/returns truncal midpoint >2 inches in all planes  ACTIVITY TOLERANCE: Activity tolerance: Pt fatigues with IADLs and community mobility  FUNCTIONAL OUTCOME MEASURES: Upper Extremity Functional Scale (UEFS): TBD  UPPER EXTREMITY ROM:    Active ROM Left  Eval WNL throughout Right OT d/c on 08/12/23 (Previous episode of care) Right Eval on 01/29/24    Shoulder flexion  133 (135) 125 (134)   Shoulder abduction  120 (125) 90 (104)   Shoulder adduction      Shoulder extension      Shoulder internal rotation  R thumb to lumbar spine (better clearance of SI joint) To R side of lower back-good clearance of SI joint   Shoulder external rotation  Hand to back of head without chin tuck and good abd  63* with arm abd (Slight  scaption)   Elbow flexion      Elbow extension      Wrist flexion      Wrist extension      Wrist ulnar deviation      Wrist radial deviation      Wrist pronation      Wrist supination      (Blank rows = not tested)  UPPER EXTREMITY MMT:     MMT Left Eval 5/5 throughout Right OT d/c on 08/12/23 (Previous episode of care) Right Eval 01/29/24   Shoulder flexion  4- 4   Shoulder abduction  4- 4   Shoulder adduction      Shoulder extension      Shoulder internal rotation  4 4   Shoulder external rotation  4- 4   Middle trapezius      Lower trapezius      Elbow flexion  4+ 4+   Elbow extension  5 5   Wrist flexion  4+ 4+   Wrist extension  4+ 4+   Wrist ulnar deviation      Wrist radial deviation      Wrist pronation      Wrist supination      (Blank rows = not tested)  HAND FUNCTION: 08/12/23: Grip strength: Right: 14 lbs, Left 60 lbs; lateral pinch: Right: 6 lbs, Left: 16 lbs , 3 point pinch: Right: 5 (IP flexion; Saehan pinch gauge), Left: 19 lbs  01/29/24 Eval: Grip strength: Right: 18 lbs; Left: 55 lbs, Lateral pinch: Right: 5 lbs, Left: 14 lbs, and 3 point pinch: Right:  Fingers slip- standard pinch gauge used 2 lbs, Left: 15 lbs  COORDINATION: 08/12/23: Right: 5 min and 19 sec; 2nd trial 3 min 49 sec, Left 23 sec,  01/29/24 Eval: 9 Hole Peg test: Right: 2 min 41 sec  sec; Left: 23 sec  SENSATION: Light touch: Impaired , tingling in the R hand   EDEMA: No visible edema  MUSCLE TONE: RUE: Mild and Moderate (Last round of Botox on 01/21/24)  COGNITION: Overall cognitive status: Within functional limits for tasks assessed  VISION: WFL; no recent changes  PERCEPTION: WFL  PRAXIS: Impaired: Motor planning and Clonus R hand  OBSERVATIONS:  Pt remains very motivated to increase functional use of RUE for daily tasks.                                                                                                              TREATMENT DATE: 02/26/24 Neuro  re-ed: -Applied kinesiotape to R dorsal thumb, anchored just below nail bed, covering length of thumb and again anchored at distal forearm/radial wrist in order to promote thumb IP and MP ext to counteract mild flexor tone during pinching/FMC tasks, as R thumb IP joint tends to flex and slip from prehension.  OT reviewed self application techniques. -Hand over hand assist required to block thumb IP flexion and IF and LF DIP flexion during pinching activities in order to reduce compensatory/substitution prehension patterns.  Therapeutic Activity: -Review of theraputty exercises issued last session, targeting R hand grasping, pinching, digit abd/add/ext, and coordination skills with digging coins out of putty.  Pt required intermittent min A to formulate correct grasping/pinching patterns, and mod vc and demo to grade exercises down to promote optimal form while reducing compensatory patterns. -Frequent positive reinforcement required with challenging tasks and vc to initiate rest breaks as needed to reduce frustration. -Added lumbrical strengthening to putty exercises with visual handout issued; practiced lumbrical grasp and strengthening with R hand around putty container, requiring hand over hand set up to initiate correct grasp pattern. -Practiced use of lumbrical grasp with poker chips and discarding chips into container  PATIENT EDUCATION: Education details: Ktape application review; HEP review with theraputty Person educated: Patient Education method: Explanation, demo Education comprehension: verbalized understanding  HOME EXERCISE PROGRAM: Pink theraputty  GOALS: Goals reviewed with patient? Yes  SHORT TERM GOALS: Target date: 03/11/24  Pt will be indep to perform HEP for improving R hand strength and coordination for  daily tasks. Baseline: Eval: Not yet initiated Goal status: INITIAL   LONG TERM GOALS: Target date: 04/22/24  Pt will increase UEFS by 10 or more points to indicate  self perceived functional improvement when engaging the RUE into daily tasks. Baseline: Eval: TBD Goal status: INITIAL  2.  Pt will increase R grip strength by 5 or more lbs to securely stabilize containers and jars for easier opening.  Baseline: Eval: R grip strength 18 lbs (L 55 lbs) Goal status: INITIAL  3.  Pt will increase R hand dexterity/FMC skills to manipulate clothing fasteners with bilat hands.  Baseline:  Goal status: INITIAL  4.  Pt will increase R active shoulder flexion to 140* or better to achieve functional ROM for UB ADLs and reaching for ADL supplies.  Baseline: Eval: R shoulder flexion 125*; difficulty reaching overhead for ADL supplies Goal status: INITIAL  ASSESSMENT:  CLINICAL IMPRESSION: Pt continues to respond well to Ktape to promote R thumb IP extension to reduce slipping of thumb around small objects when targeting various grasp patterns.  Though improving, R thumb IP joint tends to slip into flexion, as well as R IF and LF DIP joint flexing when targeting a 2 point or 3 point pinch patterns with small objects, resulting in pt using a lateral pinch with fully flexed digits 2-5 into palm of hand.  Added lumbrical strengthening to theraputty exercises to address weakness in R hand with DIP and PIP ext.  Added tennis ball thumb slides to target thumb palmer abd/add as pt is lacking this thumb rotation when attempting a spherical grasp.  Frequent positive reinforcement required with challenging tasks and vc to initiate rest breaks as needed to reduce frustration this date.  Pt continues to present with RUE pain, stiffness in the shoulder, muscle weakness, and lack of coordination, including clonus in R hand.  Pt will benefit from skilled OT to address RUE deficits in order to improve indep and efficiency when engaging the RUE into daily tasks.    PERFORMANCE DEFICITS: in functional skills including ADLs, IADLs, coordination, dexterity, sensation, tone, ROM, strength,  pain, flexibility, Fine motor control, Gross motor control, mobility, balance, body mechanics, endurance, decreased knowledge of use of DME, and UE functional use, and psychosocial skills including coping strategies, environmental adaptation, habits, and routines and behaviors.   IMPAIRMENTS: are limiting patient from ADLs, IADLs, work, leisure, and social participation.   CO-MORBIDITIES: has co-morbidities such as CHF, asthma, depression  that affects occupational performance. Patient will benefit from skilled OT to address above impairments and improve overall function.  MODIFICATION OR ASSISTANCE TO COMPLETE EVALUATION: No modification of tasks or assist necessary to complete an evaluation.  OT OCCUPATIONAL PROFILE AND HISTORY: Detailed assessment: Review of records and additional review of physical, cognitive, psychosocial history related to current functional performance.  CLINICAL DECISION MAKING: Moderate - several treatment options, min-mod task modification necessary  REHAB POTENTIAL: Good  EVALUATION COMPLEXITY: Moderate    PLAN:  OT FREQUENCY: 1-2x/week  OT DURATION: 12 weeks  PLANNED INTERVENTIONS: 97168 OT Re-evaluation, 97535 self care/ADL training, 32440 therapeutic exercise, 97530 therapeutic activity, 97112 neuromuscular re-education, 97140 manual therapy, 97010 moist heat, 97010 cryotherapy, passive range of motion, balance training, functional mobility training, psychosocial skills training, energy conservation, coping strategies training, patient/family education, and DME and/or AE instructions  RECOMMENDED OTHER SERVICES: Pt completed PT evaluation this date  CONSULTED AND AGREED WITH PLAN OF CARE: Patient  PLAN FOR NEXT SESSION: see above  Marcus Sewer, MS, OTR/L  Willene Harper  Lopaka Karge, OT 02/27/2024, 11:41 AM

## 2024-03-01 NOTE — Telephone Encounter (Signed)
 PA approved.

## 2024-03-04 ENCOUNTER — Ambulatory Visit: Payer: Medicaid Other

## 2024-03-04 ENCOUNTER — Ambulatory Visit: Payer: Medicaid Other | Attending: Internal Medicine | Admitting: Physical Therapy

## 2024-03-04 DIAGNOSIS — I6930 Unspecified sequelae of cerebral infarction: Secondary | ICD-10-CM

## 2024-03-04 DIAGNOSIS — R2689 Other abnormalities of gait and mobility: Secondary | ICD-10-CM | POA: Diagnosis present

## 2024-03-04 DIAGNOSIS — M6281 Muscle weakness (generalized): Secondary | ICD-10-CM | POA: Diagnosis not present

## 2024-03-04 DIAGNOSIS — R2681 Unsteadiness on feet: Secondary | ICD-10-CM | POA: Insufficient documentation

## 2024-03-04 DIAGNOSIS — R262 Difficulty in walking, not elsewhere classified: Secondary | ICD-10-CM | POA: Insufficient documentation

## 2024-03-04 DIAGNOSIS — R278 Other lack of coordination: Secondary | ICD-10-CM | POA: Insufficient documentation

## 2024-03-04 NOTE — Therapy (Signed)
 OUTPATIENT PHYSICAL THERAPY NEURO TREATMENT   Patient Name: Anna Tapia MRN: [id number] DOB:01-03-1991, 33 y.o., female Today's Date: 03/04/2024   PCP: Rockney Cid, DO  REFERRING PROVIDER: Rockney Cid, DO   END OF SESSION:  PT End of Session - 03/04/24 1352     Visit Number 6    Number of Visits 24    Date for PT Re-Evaluation 04/22/24    PT Start Time 1403    PT Stop Time 1443    PT Time Calculation (min) 40 min    Equipment Utilized During Treatment Gait belt    Activity Tolerance Patient tolerated treatment well;No increased pain    Behavior During Therapy WFL for tasks assessed/performed             Past Medical History:  Diagnosis Date   Arteriovenous malformation of brain    a. s/p remote coiling.   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   Congenital CHF (congestive heart failure) (HCC)    a. 03/2023 Echo: EF 60-65%, no rwma, nl RV fxn, RVSP 29.49mmHg. Mild MR. Mild Ao sclerosis w/o stenosis. Ao root 38mm.   COVID 2022   Hemorrhagic stroke (HCC) 2023   a. L-sided midbrain, thalamic ICH in setting of PCA/ACA aneurysm s/p embolization.   History of broken collarbone 2020   Hydrocephalus (HCC)    a. s/p VP shunt in childhood.   Precordial chest pain    a. 03/2023 MV: No ischemia or infarct.  EF greater than 65%.  No significant coronary calcification.   Sepsis secondary to UTI (HCC) 2021   Past Surgical History:  Procedure Laterality Date   VENTRICULOPERITONEAL SHUNT     Patient Active Problem List   Diagnosis Date Noted   History of CVA with residual deficit 11/25/2023   CHF (congestive heart failure) (HCC) 03/25/2023   Left-sided nontraumatic intracerebral hemorrhage (HCC) 08/01/2022   AVM (arteriovenous malformation) brain 07/24/2022   Asthma 07/24/2022   Major depressive disorder, recurrent episode, moderate (HCC) 02/07/2022   Sepsis secondary to UTI (HCC) 05/01/2020   VP  (ventriculoperitoneal) shunt status 05/01/2020   Right ovarian cyst 05/01/2020   Elevated LFTs 05/01/2020    ONSET DATE: September  2023  REFERRING DIAG:  Diagnosis  I69.30 (ICD-10-CM) - History of CVA with residual deficit    THERAPY DIAG:  Muscle weakness (generalized)  Other lack of coordination  History of CVA with residual deficit  Difficulty in walking, not elsewhere classified  Unsteadiness on feet  Other abnormalities of gait and mobility  Balance disorder  Rationale for Evaluation and Treatment: Rehabilitation  SUBJECTIVE:  SUBJECTIVE STATEMENT:  Pt reports that she is doing alright. Reports that she woke up this morning with pain in te R foot. Occasionally stabbing while walking and ache at rest. No known MOI  Pt accompanied by: self  PERTINENT HISTORY:  From recent MD visit:  [name] presents to follow up on chronic medical conditions. Having some knee pain today, had been doing well with PT but her insurance is no longer paying for this until the new year.    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  Also on Lyrica 50 mg BID  -Difficulty using using right hand and right side of face - completed home PT/OT but interested in continuing with therapy outside the home.  Hx of CHF as a newborn: -Had been following with Cardiology at [facility], last seen in 2018 -Last echo 9/23 EF 58% -Denies chest pain, palpitations, shortness of  breath or lower extremity swelling  PAIN:  Are you having pain? Yes: NPRS scale: 4/10 Pain location: R shoulder  Pain description: dull pain  Aggravating factors: moving arm  Relieving factors: n/a   PRECAUTIONS: Fall  RED FLAGS: None   WEIGHT BEARING RESTRICTIONS: No  FALLS: Has patient fallen in last 6 months? No  LIVING ENVIRONMENT: Lives with: lives with their family Lives in: House/apartment Stairs: Yes: External: 2 steps; on right going up and on left going up Has following equipment at home: Single point cane  PLOF: Independent, Independent with basic ADLs, and prior to CVA   PATIENT GOALS: improve walking. "Get around better"   OBJECTIVE:  Note: Objective measures were completed at Evaluation unless otherwise noted.  DIAGNOSTIC FINDINGS:   CT 2023 IMPRESSION: 1. 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 Again, prominent arterial contribution to the AVM is seen from the left ACA and posterior circulation, with primary venous drainage into the deep venous system. Associated 7 mm aneurysm along the right anterolateral aspect of the AVM as above. 2. Diffuse tortuosity and ectasia elsewhere about the major arterial vasculature of the head and neck. No large vessel occlusion or hemodynamically significant stenosis. No other acute vascular abnormality.    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   EDEMA:  Circumferential:  distal to knee in sitting R: 41.6CM L 39cm  MUSCLE TONE: RLE: Mild noted increased tone with gait and mobility.   DTRs:  Patella 1 = Trace   POSTURE: rounded shoulders, forward head, and left pelvic obliquity  LOWER EXTREMITY ROM:     Active  Right Eval Left Eval  Hip flexion [facility] [place]  Hip extension [facility]   Hip abduction    Hip adduction    Hip internal rotation    Hip external rotation    Knee flexion 110 WFL  Knee extension [facility]  Ankle dorsiflexion 8 15  Ankle plantarflexion [facility]  Ankle inversion    Ankle eversion     (Blank rows = not  tested)  LOWER EXTREMITY MMT:    MMT Right Eval Left Eval  Hip flexion 4 4+  Hip extension    Hip abduction 5 5  Hip adduction 5 5  Hip internal rotation    Hip external rotation    Knee flexion 4+ 5  Knee extension 5 5  Ankle dorsiflexion 4 5  Ankle plantarflexion    Ankle inversion    Ankle eversion    (Blank rows = not tested)  BED MOBILITY:  Sit to supine SBA Supine to sit SBA Rolling to Right SBA Rolling to Left SBA  TRANSFERS: Assistive device utilized: None  Sit to stand: SBA Stand to sit: SBA Chair to chair: SBA Floor:  need to assess    CURB:  Level of Assistance: SBA Assistive device utilized:  rail  Curb Comments: step to descent   STAIRS: Level of Assistance: SBA Stair Negotiation Technique: Step to Pattern with descent and Single Rail on Left Number of Stairs: 4  Height of Stairs: 6  Comments: mild posterior bias on descent. And step to pattern   GAIT: Gait pattern: step through pattern, decreased stance time- Right, Right hip hike, trunk rotated posterior- Left, wide BOS, and poor foot clearance- Right Distance walked: 60 Assistive device utilized: None Level of assistance: SBA Comments: see description  FUNCTIONAL TESTS:  5 times sit to stand: 12.41sec Timed up and go (TUG): 10.94 6 minute walk test: to be complete  10 meter walk test: 0.77m/s  Berg Balance Scale: 50 Functional gait assessment: 20  PATIENT SURVEYS:  ABC scale 48.75                                                                                                                              TREATMENT DATE: 01/29/2024   Standing on airex pad.  Cross body foot taps on hedge hog with UE support Static stance with  multidirectional pertubations. X 1 min  Ipsilateral foot tap on hedge hog. X 12 bil no UE support  Standing on flat side of bolster AP rocking 2x 15  Kore balance.  Dot pop 30sec x 3; 9, 10, 10 Tux racer x 3 bouts.  1)60 sec 9 fish 2)43 sec 16 fish 3)44 sec 18 fish Never ball. Completed 2 level, and failed level 3.  Cues for improved neurtal RLE position as well as use of ankle strategy to increased platform control. Greatest difficulty with shift in to posterior R LE.   Supervision assist- CGA provided by PT throughout session unless otherwise noted.    PATIENT EDUCATION: Education details: POC.  Person educated: Patient Education method: Medical illustrator Education comprehension: verbalized understanding  HOME EXERCISE PROGRAM: Access Code: [email or web address] URL: [email or web address] Date: 02/19/2024 Prepared by: Aurora Lees  Exercises - Seated March with Ankle Weights at Foot  - 1 x daily - 7 x weekly - 3 sets - 10 reps - Side Stepping with Resistance at Feet  - 1 x daily -  7 x weekly - 3 sets - 10 reps - Tandem Stance  - 1 x daily - 5 x weekly - 3 sets - 4 reps - 20 hold - Staggered Sit-to-Stand  - 1 x daily - 5 x weekly - 2 sets - 5 reps - Sit to Stand with Arms Crossed  - 1 x daily - 5 x weekly - 3 sets - 10 reps - Seated Knee Extension with Resistance  - 1 x daily - 7 x weekly - 3 sets - 10 reps - Seated Hip Abduction with Resistance  - 1 x daily - 7 x weekly - 3 sets - 10 reps - Standing Single Leg Stance with Counter Support  - 1 x daily - 7 x weekly - 3 sets - 10 reps  GOALS: Goals reviewed with patient? Yes  SHORT TERM GOALS: Target date: 02/27/2024  Patient will be independent in home exercise program to improve strength/mobility for better functional independence with ADLs. Baseline: to be given on visit 2  Goal status: INITIAL  LONG TERM GOALS: Target date: 04/23/2024  Patient will increase ABC score to equal to or greater than   10%   to demonstrate statistically significant improvement in mobility and quality of life.  Baseline: 48.75 Goal status: INITIAL  2.  Patient (> 55 years old) will complete five times sit to stand test in < 11 seconds indicating an increased LE strength and improved balance. Baseline: 12.41sec Goal status: INITIAL  3.  Patient will increase 6 min walk test by >164ft points to demonstrate decreased fall risk during functional activities Baseline: 954ft without AD. No rest break needed.  Goal status: INITIAL  4.  Patient will increase 10 meter walk test to >1.9m/s as to improve gait speed for better community ambulation and to reduce fall risk. Baseline: 0.60m/s Goal status: INITIAL  5.  Patient will reduce timed up and go to <10 seconds to reduce fall risk and demonstrate improved transfer/gait ability. Baseline: 10.94 Goal status: INITIAL  6.  Patient will increase dynamic gait index score to >24 as to demonstrate reduced fall risk and improved dynamic gait balance for better safety with community/home ambulation.   Baseline: 20 Goal status: INITIAL   ASSESSMENT:  CLINICAL IMPRESSION:  Pt put forth good effort throughout the session and was able to safely navigate ambulation outside across varying surfaces. PT treatment focused on improved righting reactions and ankle ROM in functional patterns to reduced fall risk and improve safety with gait and balance on unlevel surface.  Pt will continue to benefit from skilled therapy to address remaining deficits in order to improve overall QoL and return to PLOF.     OBJECTIVE IMPAIRMENTS: Abnormal gait, cardiopulmonary status limiting activity, decreased activity tolerance, decreased balance, decreased coordination, decreased endurance, decreased knowledge of use of DME, decreased mobility, difficulty walking, decreased ROM, decreased strength, hypomobility, increased edema, increased fascial restrictions, increased muscle spasms, impaired  flexibility, impaired sensation, impaired tone, impaired UE functional use, improper body mechanics, postural dysfunction, and pain.   ACTIVITY LIMITATIONS: carrying, lifting, bending, squatting, stairs, transfers, bed mobility, dressing, and locomotion level  PARTICIPATION LIMITATIONS: cleaning, laundry, interpersonal relationship, driving, shopping, community activity, occupation, and yard work  PERSONAL FACTORS: 1-2 comorbidities: hs of CHF and CVA  are also affecting patient's functional outcome.   REHAB POTENTIAL: Good  CLINICAL DECISION MAKING: Evolving/moderate complexity  EVALUATION COMPLEXITY: Moderate  PLAN:  PT FREQUENCY: 1-2x/week  PT DURATION: 12 weeks  PLANNED INTERVENTIONS: 97110-Therapeutic exercises, 97530- Therapeutic activity, 97112-  Neuromuscular re-education, [phone]- Self Care, 82956- Manual therapy, [phone]- Gait training, [phone]- Orthotic Fit/training, [id number]- Splinting, [phone]- Electrical stimulation (unattended), [phone]- Electrical stimulation (manual), Patient/Family education, Balance training, Stair training, Taping, Dry Needling, Joint mobilization, Joint manipulation, Spinal manipulation, Spinal mobilization, Vestibular training, Visual/preceptual remediation/compensation, DME instructions, Cryotherapy, and Moist heat  PLAN FOR NEXT SESSION:   Continue Dynamic balance training. R ankle and gluteal strengthening   Aurora Lees PT, DPT  Physical Therapist - [facility]  [facility]  1:53 PM 03/04/24

## 2024-03-07 NOTE — Therapy (Signed)
 OUTPATIENT OCCUPATIONAL THERAPY NEURO TREATMENT NOTE  Patient Name: Anna Tapia MRN: [id number] DOB:01-11-1991, 33 y.o., female Today's Date: 03/07/2024  PCP: Dr. Rockney Cid  REFERRING PROVIDER: Dr. Rockney Cid  END OF SESSION:  OT End of Session - 03/07/24 1553     Visit Number 5    Number of Visits 24    Date for OT Re-Evaluation 04/22/24    Authorization Type [employer]; [employer] required    Progress Note Due on Visit 10    OT Start Time 1445    OT Stop Time 1530    OT Time Calculation (min) 45 min    Activity Tolerance Patient tolerated treatment well    Behavior During Therapy WFL for tasks assessed/performed            Past Medical History:  Diagnosis Date   Arteriovenous malformation of brain    a. s/p remote coiling.   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   Congenital CHF (congestive heart failure) (HCC)    a. 03/2023 Echo: EF 60-65%, no rwma, nl RV fxn, RVSP 29.23mmHg. Mild MR. Mild Ao sclerosis w/o stenosis. Ao root 38mm.   COVID 2022   Hemorrhagic stroke (HCC) 2023   a. L-sided midbrain, thalamic ICH in setting of PCA/ACA aneurysm s/p embolization.   History of broken collarbone 2020   Hydrocephalus (HCC)    a. s/p VP shunt in childhood.   Precordial chest pain    a. 03/2023 MV: No ischemia or infarct.  EF greater than 65%.  No significant coronary calcification.   Sepsis secondary to UTI (HCC) 2021   Past Surgical History:  Procedure Laterality Date   VENTRICULOPERITONEAL SHUNT     Patient Active Problem List   Diagnosis Date Noted   History of CVA with residual deficit 11/25/2023   CHF (congestive heart failure) (HCC) 03/25/2023   Left-sided nontraumatic intracerebral hemorrhage (HCC) 08/01/2022   AVM (arteriovenous malformation) brain 07/24/2022   Asthma 07/24/2022   Major depressive disorder, recurrent episode, moderate (HCC) 02/07/2022   Sepsis secondary to UTI (HCC) 05/01/2020   VP  (ventriculoperitoneal) shunt status 05/01/2020   Right ovarian cyst 05/01/2020   Elevated LFTs 05/01/2020   ONSET DATE: Sept 2023  REFERRING DIAG: I69.30 (ICD-10-CM) - History of CVA with residual deficit   THERAPY DIAG:  Muscle weakness (generalized)  Other lack of coordination  History of CVA with residual deficit  Rationale for Evaluation and Treatment: Rehabilitation  SUBJECTIVE:  SUBJECTIVE STATEMENT: Pt reports doing well today. Pt accompanied by: self  PERTINENT HISTORY: Pt known to this clinic after participation in therapy in 2024 following her L CVA in 2023.  Pt was discharged from OT in Oct of 2024 d/t visit limitations with therapy.  Pt returns today to address RUE residual deficits from CVA after most recent Botox injections on 01/21/24.  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 Yes: NPRS scale: 4-5/10 R shoulder from recent Botox  Pain location: R shoulder  Pain description: achy Aggravating factors: Botox shot Relieving factors: heat, rest  FALLS: Has patient fallen in last 6 months? No  LIVING ENVIRONMENT: Lives with: lives with  their family mother and 3 brothers, but mostly staying with her aunt  Lives in: ground level apartment with aunt  Stairs: Yes: External: 3 steps; bilateral but cannot reach both Has following equipment at home: Single point cane  PLOF: Independent prior to CVA  PATIENT GOALS: "Using my arm more."   OBJECTIVE:  Note: Objective measures were completed at Evaluation unless otherwise noted.  HAND DOMINANCE: Left  ADLs: Overall ADLs: Pt reports that she tries to engage the R hand into everything she does Transfers/ambulation  related to ADLs: indep Eating: cuts food with L hand Grooming: increased time to engage the R  UB Dressing: Increased time with clothing fasteners LB Dressing: wears slip on shoes; wore jeans today and did ok with the clothing fasteners, extra time  Toileting: indep Bathing: occasional pain when reaching over with the R arm to bathe the L arm (R pec and shoulder pain) Tub Shower transfers: modified indep Equipment: Shower seat with back, Grab bars, and hand held shower hose   IADLs: Shopping: pt can go shopping accompanied by a family member  Light housekeeping: pt reports unable to sweep; pt reports she gets dizzy  Meal Prep: Pt reports she now cooks about 3 nights a week, but has difficulty opening containers/jars/food packages in the [employer] mobility: uses cane Medication management: modified indep; increased time to engage the R hand Financial management: mother manages (pt states she doesn't really have any bills)  Handwriting:  NT (pt is L hand dominant)  MOBILITY STATUS:  Uses cane for longer distances, but did not bring cane today  POSTURE COMMENTS:  rounded shoulders Sitting balance: Moves/returns truncal midpoint >2 inches in all planes  ACTIVITY TOLERANCE: Activity tolerance: Pt fatigues with IADLs and community mobility  FUNCTIONAL OUTCOME MEASURES: Upper Extremity Functional Scale (UEFS): TBD  UPPER EXTREMITY ROM:    Active ROM Left  Eval WNL throughout Right OT d/c on 08/12/23 (Previous episode of care) Right Eval on 01/29/24    Shoulder flexion  133 (135) 125 (134)   Shoulder abduction  120 (125) 90 (104)   Shoulder adduction      Shoulder extension      Shoulder internal rotation  R thumb to lumbar spine (better clearance of SI joint) To R side of lower back-good clearance of SI joint   Shoulder external rotation  Hand to back of head without chin tuck and good abd  63* with arm abd (Slight scaption)   Elbow flexion      Elbow extension       Wrist flexion      Wrist extension      Wrist ulnar deviation      Wrist radial deviation      Wrist pronation      Wrist supination      (Blank rows = not tested)  UPPER EXTREMITY MMT:     MMT Left Eval 5/5 throughout Right OT d/c on 08/12/23 (Previous episode of care) Right Eval 01/29/24   Shoulder flexion  4- 4   Shoulder abduction  4- 4   Shoulder adduction      Shoulder extension      Shoulder internal rotation  4 4   Shoulder external rotation  4- 4   Middle trapezius      Lower trapezius      Elbow flexion  4+ 4+   Elbow extension  5 5   Wrist flexion  4+ 4+   Wrist extension  4+ 4+  Wrist ulnar deviation      Wrist radial deviation      Wrist pronation      Wrist supination      (Blank rows = not tested)  HAND FUNCTION: 08/12/23: Grip strength: Right: 14 lbs, Left 60 lbs; lateral pinch: Right: 6 lbs, Left: 16 lbs , 3 point pinch: Right: 5 (IP flexion; Saehan pinch gauge), Left: 19 lbs  01/29/24 Eval: Grip strength: Right: 18 lbs; Left: 55 lbs, Lateral pinch: Right: 5 lbs, Left: 14 lbs, and 3 point pinch: Right:  Fingers slip- standard pinch gauge used 2 lbs, Left: 15 lbs  COORDINATION: 08/12/23: Right: 5 min and 19 sec; 2nd trial 3 min 49 sec, Left 23 sec,  01/29/24 Eval: 9 Hole Peg test: Right: 2 min 41 sec  sec; Left: 23 sec  SENSATION: Light touch: Impaired , tingling in the R hand   EDEMA: No visible edema  MUSCLE TONE: RUE: Mild and Moderate (Last round of Botox on 01/21/24)  COGNITION: Overall cognitive status: Within functional limits for tasks assessed  VISION: WFL; no recent changes  PERCEPTION: WFL  PRAXIS: Impaired: Motor planning and Clonus R hand  OBSERVATIONS:  Pt remains very motivated to increase functional use of RUE for daily tasks.                                                                                                              TREATMENT DATE: 03/04/24 Therapeutic Activity: -Reapplied Ktape to R dorsal thumb, and added to  dorsal IF and LF to minimize PIP and DIP flexion during pinching activities -Practiced pinching into theraputty with focus on optimal form/reduced compensation: digit opposition, lateral, 3 pt pinch -Practiced lumbrical grasp and strengthening with R hand around putty container, requiring hand over hand set up to initiate correct grasp pattern. -Practiced picking up alphabet dice using 3 point pinch pattern; min vc to grasp with fingertips flat on dice to reduce slipping -Practiced maintaining form with 3 point pinch with added resistance using yellow and red clothespins, working to remove them from a horiz dowel.  Therapeutic Exercise: R hand strengthening: Tennis ball thumb slides to promote R thumb palmar abd  R hand flexibility: passive thumb flexion stretch at the [facility], MP, and DIP to reduce stiffness as R thumb tends to rest in extension with slight extensor tone with added clonus; OT reviewed self passive stretching techniques to achieve above  PATIENT EDUCATION: Education details: [employer]chant [employer] officer review; HEP review with theraputty Person educated: Patient Education method: Explanation, demo Education comprehension: verbalized understanding  HOME EXERCISE PROGRAM: Pink theraputty  GOALS: Goals reviewed with patient? Yes  SHORT TERM GOALS: Target date: 03/11/24  Pt will be indep to perform HEP for improving R hand strength and coordination for daily tasks. Baseline: Eval: Not yet initiated Goal status: INITIAL   LONG TERM GOALS: Target date: 04/22/24  Pt will increase UEFS by 10 or more points to indicate self perceived functional improvement when engaging the RUE into daily tasks. Baseline: Eval: TBD Goal status: INITIAL  2.  Pt will increase R grip strength by 5 or more lbs to securely stabilize containers and jars for easier opening.  Baseline: Eval: R grip strength 18 lbs (L 55 lbs) Goal status: INITIAL  3.  Pt will increase R hand dexterity/FMC skills to manipulate  clothing fasteners with bilat hands.  Baseline:  Goal status: INITIAL  4.  Pt will increase R active shoulder flexion to 140* or better to achieve functional ROM for UB ADLs and reaching for ADL supplies.  Baseline: Eval: R shoulder flexion 125*; difficulty reaching overhead for ADL supplies Goal status: INITIAL  ASSESSMENT:  CLINICAL IMPRESSION: Pt continues to respond well to Ktape to promote R thumb IP extension to reduce slipping of thumb around small objects when targeting various grasp patterns.  Added Ktape to R IF and LF to promote the same, with pt able to demo good maintenance of 3 point pinch to pick up alphabet dice and to pinch light resistance clothespins (yellow and red) without fingers slipping into flexion.  Pt was able to maintain lumbrical grasp slightly better today, but required tactile and visual cues for correct positioning.  Pt continues to present with RUE pain, stiffness in the shoulder, muscle weakness, and lack of coordination, including clonus in R hand.  Pt will benefit from skilled OT to address RUE deficits in order to improve indep and efficiency when engaging the RUE into daily tasks.    PERFORMANCE DEFICITS: in functional skills including ADLs, IADLs, coordination, dexterity, sensation, tone, ROM, strength, pain, flexibility, Fine motor control, Gross motor control, mobility, balance, body mechanics, endurance, decreased knowledge of use of DME, and UE functional use, and psychosocial skills including coping strategies, environmental adaptation, habits, and routines and behaviors.   IMPAIRMENTS: are limiting patient from ADLs, IADLs, work, leisure, and social participation.   CO-MORBIDITIES: has co-morbidities such as CHF, asthma, depression  that affects occupational performance. Patient will benefit from skilled OT to address above impairments and improve overall function.  MODIFICATION OR ASSISTANCE TO COMPLETE EVALUATION: No modification of tasks or assist  necessary to complete an evaluation.  OT OCCUPATIONAL PROFILE AND HISTORY: Detailed assessment: Review of records and additional review of physical, cognitive, psychosocial history related to current functional performance.  CLINICAL DECISION MAKING: Moderate - several treatment options, min-mod task modification necessary  REHAB POTENTIAL: Good  EVALUATION COMPLEXITY: Moderate    PLAN:  OT FREQUENCY: 1-2x/week  OT DURATION: 12 weeks  PLANNED INTERVENTIONS: 97168 OT Re-evaluation, 97535 self care/ADL training, 62130 therapeutic exercise, 97530 therapeutic activity, 97112 neuromuscular re-education, 97140 manual therapy, 97010 moist heat, 97010 cryotherapy, passive range of motion, balance training, functional mobility training, psychosocial skills training, energy conservation, coping strategies training, patient/family education, and DME and/or AE instructions  RECOMMENDED OTHER SERVICES: Pt completed PT evaluation this date  CONSULTED AND AGREED WITH PLAN OF CARE: Patient  PLAN FOR NEXT SESSION: see above  Marcus Sewer, MS, OTR/L  Casandra Claw, OT 03/07/2024, 3:56 PM

## 2024-03-11 ENCOUNTER — Ambulatory Visit: Payer: Medicaid Other

## 2024-03-11 ENCOUNTER — Other Ambulatory Visit: Payer: Self-pay | Admitting: Internal Medicine

## 2024-03-11 ENCOUNTER — Encounter: Payer: Self-pay | Admitting: Internal Medicine

## 2024-03-11 ENCOUNTER — Ambulatory Visit: Payer: Medicaid Other | Admitting: Physical Therapy

## 2024-03-11 DIAGNOSIS — M6281 Muscle weakness (generalized): Secondary | ICD-10-CM

## 2024-03-11 DIAGNOSIS — I6930 Unspecified sequelae of cerebral infarction: Secondary | ICD-10-CM

## 2024-03-11 DIAGNOSIS — R2689 Other abnormalities of gait and mobility: Secondary | ICD-10-CM

## 2024-03-11 DIAGNOSIS — R262 Difficulty in walking, not elsewhere classified: Secondary | ICD-10-CM

## 2024-03-11 DIAGNOSIS — R278 Other lack of coordination: Secondary | ICD-10-CM

## 2024-03-11 DIAGNOSIS — Z7689 Persons encountering health services in other specified circumstances: Secondary | ICD-10-CM | POA: Diagnosis not present

## 2024-03-11 DIAGNOSIS — R2681 Unsteadiness on feet: Secondary | ICD-10-CM

## 2024-03-11 DIAGNOSIS — F331 Major depressive disorder, recurrent, moderate: Secondary | ICD-10-CM

## 2024-03-11 MED ORDER — DULOXETINE HCL 20 MG PO CPEP: 40.0000 mg | ORAL_CAPSULE | Freq: Every day | ORAL | 0 refills | Status: DC

## 2024-03-11 NOTE — Telephone Encounter (Signed)
 Spoke to patient and rescheduled appt

## 2024-03-11 NOTE — Therapy (Signed)
 OUTPATIENT PHYSICAL THERAPY NEURO TREATMENT   Patient Name: Anna Tapia MRN: [id number] DOB:09-02-1991, 33 y.o., female Today's Date: 03/11/2024   PCP: Rockney Cid, DO  REFERRING PROVIDER: Rockney Cid, DO   END OF SESSION:  PT End of Session - 03/11/24 1406     Visit Number 7    Number of Visits 24    Date for PT Re-Evaluation 04/22/24    PT Start Time 1404    PT Stop Time 1445    PT Time Calculation (min) 41 min    Equipment Utilized During Treatment Gait belt    Activity Tolerance Patient tolerated treatment well;No increased pain    Behavior During Therapy WFL for tasks assessed/performed             Past Medical History:  Diagnosis Date   Arteriovenous malformation of brain    a. s/p remote coiling.   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   Congenital CHF (congestive heart failure) (HCC)    a. 03/2023 Echo: EF 60-65%, no rwma, nl RV fxn, RVSP 29.62mmHg. Mild MR. Mild Ao sclerosis w/o stenosis. Ao root 38mm.   COVID 2022   Hemorrhagic stroke (HCC) 2023   a. L-sided midbrain, thalamic ICH in setting of PCA/ACA aneurysm s/p embolization.   History of broken collarbone 2020   Hydrocephalus (HCC)    a. s/p VP shunt in childhood.   Precordial chest pain    a. 03/2023 MV: No ischemia or infarct.  EF greater than 65%.  No significant coronary calcification.   Sepsis secondary to UTI (HCC) 2021   Past Surgical History:  Procedure Laterality Date   VENTRICULOPERITONEAL SHUNT     Patient Active Problem List   Diagnosis Date Noted   History of CVA with residual deficit 11/25/2023   CHF (congestive heart failure) (HCC) 03/25/2023   Left-sided nontraumatic intracerebral hemorrhage (HCC) 08/01/2022   AVM (arteriovenous malformation) brain 07/24/2022   Asthma 07/24/2022   Major depressive disorder, recurrent episode, moderate (HCC) 02/07/2022   Sepsis secondary to UTI (HCC) 05/01/2020   VP  (ventriculoperitoneal) shunt status 05/01/2020   Right ovarian cyst 05/01/2020   Elevated LFTs 05/01/2020    ONSET DATE: September  2023  REFERRING DIAG:  Diagnosis  I69.30 (ICD-10-CM) - History of CVA with residual deficit    THERAPY DIAG:  Muscle weakness (generalized)  Other lack of coordination  History of CVA with residual deficit  Difficulty in walking, not elsewhere classified  Other abnormalities of gait and mobility  Balance disorder  Unsteadiness on feet  Rationale for Evaluation and Treatment: Rehabilitation  SUBJECTIVE:  SUBJECTIVE STATEMENT:  Pt reports that she is doing alright. Reports that she woke up this morning with pain in te R foot. Occasionally stabbing while walking and ache at rest. No known MOI  Pt accompanied by: self  PERTINENT HISTORY:  From recent MD visit:  Anna Tapia presents to follow up on chronic medical conditions. Having some knee pain today, had been doing well with PT but her insurance is no longer paying for this until the new year.    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  Also on Lyrica 50 mg BID  -Difficulty using using right hand and right side of face - completed home PT/OT but interested in continuing with therapy outside the home.  Hx of CHF as a newborn: -Had been following with Cardiology at [facility], last seen in 2018 -Last echo 9/23 EF 58% -Denies chest pain, palpitations, shortness of  breath or lower extremity swelling  PAIN:  Are you having pain? Yes: NPRS scale: 4/10 Pain location: R shoulder  Pain description: dull pain  Aggravating factors: moving arm  Relieving factors: n/a   PRECAUTIONS: Fall  RED FLAGS: None   WEIGHT BEARING RESTRICTIONS: No  FALLS: Has patient fallen in last 6 months? No  LIVING ENVIRONMENT: Lives with: lives with their family Lives in: House/apartment Stairs: Yes: External: 2 steps; on right going up and on left going up Has following equipment at home: Single point cane  PLOF: Independent, Independent with basic ADLs, and prior to CVA   PATIENT GOALS: improve walking. "Get around better"   OBJECTIVE:  Note: Objective measures were completed at Evaluation unless otherwise noted.  DIAGNOSTIC FINDINGS:   CT 2023 IMPRESSION: 1. 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 Again, prominent arterial contribution to the AVM is seen from the left ACA and posterior circulation, with primary venous drainage into the deep venous system. Associated 7 mm aneurysm along the right anterolateral aspect of the AVM as above. 2. Diffuse tortuosity and ectasia elsewhere about the major arterial vasculature of the head and neck. No large vessel occlusion or hemodynamically significant stenosis. No other acute vascular abnormality.    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   EDEMA:  Circumferential:  distal to knee in sitting R: 41.6CM L 39cm  MUSCLE TONE: RLE: Mild noted increased tone with gait and mobility.   DTRs:  Patella 1 = Trace   POSTURE: rounded shoulders, forward head, and left pelvic obliquity  LOWER EXTREMITY ROM:     Active  Right Eval Left Eval  Hip flexion [facility]  Hip extension [facility]   Hip abduction    Hip adduction    Hip internal rotation    Hip external rotation    Knee flexion 110 WFL  Knee extension [facility]  Ankle dorsiflexion 8 15  Ankle plantarflexion [facility]  Ankle inversion    Ankle eversion     (Blank rows = not  tested)  LOWER EXTREMITY MMT:    MMT Right Eval Left Eval  Hip flexion 4 4+  Hip extension    Hip abduction 5 5  Hip adduction 5 5  Hip internal rotation    Hip external rotation    Knee flexion 4+ 5  Knee extension 5 5  Ankle dorsiflexion 4 5  Ankle plantarflexion    Ankle inversion    Ankle eversion    (Blank rows = not tested)  BED MOBILITY:  Sit to supine SBA Supine to sit SBA Rolling to Right SBA Rolling to Left SBA  TRANSFERS: Assistive device utilized: None  Sit to stand: SBA Stand to sit: SBA Chair to chair: SBA Floor: need to assess    CURB:  Level of Assistance: SBA Assistive device utilized: rail  Curb Comments: step to descent   STAIRS: Level of Assistance: SBA Stair Negotiation Technique: Step to Pattern with descent and Single Rail on Left Number of Stairs: 4  Height of Stairs: 6  Comments: mild posterior bias on descent. And step to pattern   GAIT: Gait pattern: step through pattern, decreased stance time- Right, Right hip hike, trunk rotated posterior- Left, wide BOS, and poor foot clearance- Right Distance walked: 60 Assistive device utilized: None Level of assistance: SBA Comments: see description  FUNCTIONAL TESTS:  5 times sit to stand: 12.41sec Timed up and go (TUG): 10.94 6 minute walk test: to be complete  10 meter walk test: 0.35m/s  Berg Balance Scale: 50 Functional gait assessment: 20  PATIENT SURVEYS:  ABC scale 48.75                                                                                                                              TREATMENT DATE: 01/29/2024   Nustep: x 5 min level Level 1-4. Able to use BUE/BLE, throughout entire 5 min without pain or cramping.    Blaze pods. -4 pods. 2 min. Random: 4inch step between pods spaced 40ft apart.  19 hits x 2 bouts.  -4 pods. 2 min. Random: side stepping 8 ft apart. X 2 bouts 22 hits and 26 hits. Cues for use of RLE to tap on target with increased speed and use of RUE to step onto pods and up/down step.   Seated cross body punch x 1 min Standing cross body punch x 30 sec Standing cross body punch then duck x 1 min  Walking reciprocal punch x 2 min  Standing jab then hook x 1 min  Cues for elbow extension throughout as well as improved reciprocal motor pattern.    Supervision assist provided by PT throughout session unless otherwise noted. Left with OT at end of session.   PATIENT EDUCATION: Education details: POC.  Person educated: Patient Education method: Medical illustrator Education comprehension: verbalized understanding  HOME EXERCISE PROGRAM: Access Code: [email or web address] URL: [email or web address] Date: 02/19/2024 Prepared by: Aurora Lees  Exercises - Seated March with Ankle Weights at Foot  -  1 x daily - 7 x weekly - 3 sets - 10 reps - Side Stepping with Resistance at Feet  - 1 x daily - 7 x weekly - 3 sets - 10 reps - Tandem Stance  - 1 x daily - 5 x weekly - 3 sets - 4 reps - 20 hold - Staggered Sit-to-Stand  - 1 x daily - 5 x weekly - 2 sets - 5 reps - Sit to Stand with Arms Crossed  - 1 x daily - 5 x weekly - 3 sets - 10 reps - Seated Knee Extension with Resistance  - 1 x daily - 7 x weekly - 3 sets - 10 reps - Seated Hip Abduction with Resistance  - 1 x daily - 7 x weekly - 3 sets - 10 reps - Standing Single Leg Stance with Counter Support  - 1 x daily - 7 x weekly - 3 sets - 10 reps  GOALS: Goals reviewed with patient? Yes  SHORT TERM GOALS: Target date: 02/27/2024  Patient will be independent in home exercise program to improve strength/mobility for better functional independence with ADLs. Baseline: to be given on visit 2  Goal status: INITIAL  LONG  TERM GOALS: Target date: 04/23/2024  Patient will increase ABC score to equal to or greater than   10%  to demonstrate statistically significant improvement in mobility and quality of life.  Baseline: 48.75 Goal status: INITIAL  2.  Patient (> 59 years old) will complete five times sit to stand test in < 11 seconds indicating an increased LE strength and improved balance. Baseline: 12.41sec Goal status: INITIAL  3.  Patient will increase 6 min walk test by >142ft points to demonstrate decreased fall risk during functional activities Baseline: 947ft without AD. No rest break needed.  Goal status: INITIAL  4.  Patient will increase 10 meter walk test to >1.29m/s as to improve gait speed for better community ambulation and to reduce fall risk. Baseline: 0.79m/s Goal status: INITIAL  5.  Patient will reduce timed up and go to <10 seconds to reduce fall risk and demonstrate improved transfer/gait ability. Baseline: 10.94 Goal status: INITIAL  6.  Patient will increase dynamic gait index score to >24 as to demonstrate reduced fall risk and improved dynamic gait balance for better safety with community/home ambulation.   Baseline: 20 Goal status: INITIAL   ASSESSMENT:  CLINICAL IMPRESSION:  Pt put forth good effort throughout the session with emphasis on dynamic balance and gait training in various planes of movement.  Was able to increase use of RLE to step on targets and ascend and descent small step. Mild difficulty with reciprocal pattern with gait while boxing, but improved with time as well as improved pelvic rotation and elbow extension. Pt will continue to benefit from skilled therapy to address remaining deficits in order to improve overall QoL and return to PLOF.     OBJECTIVE IMPAIRMENTS: Abnormal gait, cardiopulmonary status limiting activity, decreased activity tolerance, decreased balance, decreased coordination, decreased endurance, decreased knowledge of use of DME,  decreased mobility, difficulty walking, decreased ROM, decreased strength, hypomobility, increased edema, increased fascial restrictions, increased muscle spasms, impaired flexibility, impaired sensation, impaired tone, impaired UE functional use, improper body mechanics, postural dysfunction, and pain.   ACTIVITY LIMITATIONS: carrying, lifting, bending, squatting, stairs, transfers, bed mobility, dressing, and locomotion level  PARTICIPATION LIMITATIONS: cleaning, laundry, interpersonal relationship, driving, shopping, community activity, occupation, and yard work  PERSONAL FACTORS: 1-2 comorbidities: hs of CHF and CVA are also  affecting patient's functional outcome.   REHAB POTENTIAL: Good  CLINICAL DECISION MAKING: Evolving/moderate complexity  EVALUATION COMPLEXITY: Moderate  PLAN:  PT FREQUENCY: 1-2x/week  PT DURATION: 12 weeks  PLANNED INTERVENTIONS: 97110-Therapeutic exercises, 97530- Therapeutic activity, 97112- Neuromuscular re-education, 97535- Self Care, [id number]- Manual therapy, [phone]- Gait training, [phone]- Orthotic Fit/training, [phone]- Splinting, [phone]- Electrical stimulation (unattended), [phone]- Electrical stimulation (manual), Patient/Family education, Balance training, Stair training, Taping, Dry Needling, Joint mobilization, Joint manipulation, Spinal manipulation, Spinal mobilization, Vestibular training, Visual/preceptual remediation/compensation, DME instructions, Cryotherapy, and Moist heat  PLAN FOR NEXT SESSION:   Continue Dynamic balance training. R ankle and gluteal strengthening   Aurora Lees PT, DPT  Physical Therapist - [facility]  2:06 PM 03/11/24

## 2024-03-11 NOTE — Therapy (Signed)
 OUTPATIENT OCCUPATIONAL THERAPY NEURO TREATMENT NOTE  Patient Name: Anna Tapia MRN: [id number] DOB:August 16, 1991, 33 y.o., female Today's Date: 03/11/2024  PCP: Dr. Rockney Cid  REFERRING PROVIDER: Dr. Rockney Cid  END OF SESSION:  OT End of Session - 03/11/24 1927     Visit Number 6    Number of Visits 24    Date for OT Re-Evaluation 04/22/24    Authorization Type [employer]; [employer] required    Progress Note Due on Visit 10    OT Start Time 1445    OT Stop Time 1530    OT Time Calculation (min) 45 min    Activity Tolerance Patient tolerated treatment well    Behavior During Therapy WFL for tasks assessed/performed            Past Medical History:  Diagnosis Date   Arteriovenous malformation of brain    a. s/p remote coiling.   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   Congenital CHF (congestive heart failure) (HCC)    a. 03/2023 Echo: EF 60-65%, no rwma, nl RV fxn, RVSP 29.25mmHg. Mild MR. Mild Ao sclerosis w/o stenosis. Ao root 38mm.   COVID 2022   Hemorrhagic stroke (HCC) 2023   a. L-sided midbrain, thalamic ICH in setting of PCA/ACA aneurysm s/p embolization.   History of broken collarbone 2020   Hydrocephalus (HCC)    a. s/p VP shunt in childhood.   Precordial chest pain    a. 03/2023 MV: No ischemia or infarct.  EF greater than 65%.  No significant coronary calcification.   Sepsis secondary to UTI (HCC) 2021   Past Surgical History:  Procedure Laterality Date   VENTRICULOPERITONEAL SHUNT     Patient Active Problem List   Diagnosis Date Noted   History of CVA with residual deficit 11/25/2023   CHF (congestive heart failure) (HCC) 03/25/2023   Left-sided nontraumatic intracerebral hemorrhage (HCC) 08/01/2022   AVM (arteriovenous malformation) brain 07/24/2022   Asthma 07/24/2022   Major depressive disorder, recurrent episode, moderate (HCC) 02/07/2022   Sepsis secondary to UTI (HCC) 05/01/2020   VP  (ventriculoperitoneal) shunt status 05/01/2020   Right ovarian cyst 05/01/2020   Elevated LFTs 05/01/2020   ONSET DATE: Sept 2023  REFERRING DIAG: I69.30 (ICD-10-CM) - History of CVA with residual deficit   THERAPY DIAG:  Muscle weakness (generalized)  Other lack of coordination  History of CVA with residual deficit  Rationale for Evaluation and Treatment: Rehabilitation  SUBJECTIVE:  SUBJECTIVE STATEMENT: Pt reports that she has difficulty applying Ktape to her own hand. Pt accompanied by: self  PERTINENT HISTORY: Pt known to this clinic after participation in therapy in 2024 following her L CVA in 2023.  Pt was discharged from OT in Oct of 2024 d/t visit limitations with therapy.  Pt returns today to address RUE residual deficits from CVA after most recent Botox injections on 01/21/24.  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 Yes: NPRS scale: 4-5/10 R shoulder from recent Botox  Pain location: R shoulder  Pain description: achy Aggravating factors: Botox shot Relieving factors: heat, rest  FALLS: Has patient fallen in last 6 months? No  LIVING ENVIRONMENT: Lives with: lives with their family mother and 3 brothers, but mostly staying with her aunt  Lives in: ground level apartment with aunt  Stairs: Yes: External: 3 steps; bilateral but cannot reach both Has following equipment at home: Single point cane  PLOF: Independent prior to CVA  PATIENT GOALS: "Using my arm more."   OBJECTIVE:  Note: Objective measures were completed at Evaluation unless otherwise noted.  HAND DOMINANCE: Left  ADLs: Overall ADLs: Pt reports that she tries to engage the R hand into  everything she does Transfers/ambulation related to ADLs: indep Eating: cuts food with L hand Grooming: increased time to engage the R  UB Dressing: Increased time with clothing fasteners LB Dressing: wears slip on shoes; wore jeans today and did ok with the clothing fasteners, extra time  Toileting: indep Bathing: occasional pain when reaching over with the R arm to bathe the L arm (R pec and shoulder pain) Tub Shower transfers: modified indep Equipment: Shower seat with back, Grab bars, and hand held shower hose   IADLs: Shopping: pt can go shopping accompanied by a family member  Light housekeeping: pt reports unable to sweep; pt reports she gets dizzy  Meal Prep: Pt reports she now cooks about 3 nights a week, but has difficulty opening containers/jars/food packages in the [employer] mobility: uses cane Medication management: modified indep; increased time to engage the R hand Financial management: mother manages (pt states she doesn't really have any bills)  Handwriting: NT (pt is L hand dominant)  MOBILITY STATUS: Uses cane for longer distances, but did not bring cane today  POSTURE COMMENTS:  rounded shoulders Sitting balance: Moves/returns truncal midpoint >2 inches in all planes  ACTIVITY TOLERANCE: Activity tolerance: Pt fatigues with IADLs and community mobility  FUNCTIONAL OUTCOME MEASURES: Upper Extremity Functional Scale (UEFS): TBD  UPPER EXTREMITY ROM:    Active ROM Left  Eval WNL throughout Right OT d/c on 08/12/23 (Previous episode of care) Right Eval on 01/29/24    Shoulder flexion  133 (135) 125 (134)   Shoulder abduction  120 (125) 90 (104)   Shoulder adduction      Shoulder extension      Shoulder internal rotation  R thumb to lumbar spine (better clearance of SI joint) To R side of lower back-good clearance of SI joint   Shoulder external rotation  Hand to back of head without chin tuck and good abd  63* with arm abd (Slight scaption)    Elbow flexion      Elbow extension      Wrist flexion      Wrist extension      Wrist ulnar deviation      Wrist radial deviation      Wrist pronation      Wrist supination      (Blank rows = not tested)  UPPER EXTREMITY MMT:     MMT Left Eval 5/5 throughout Right OT d/c on 08/12/23 (Previous episode of care) Right Eval 01/29/24   Shoulder flexion  4- 4   Shoulder abduction  4- 4   Shoulder adduction      Shoulder extension      Shoulder internal rotation  4 4   Shoulder external rotation  4- 4   Middle trapezius      Lower trapezius      Elbow flexion  4+ 4+   Elbow extension  5 5   Wrist flexion  4+ 4+   Wrist extension  4+ 4+   Wrist ulnar deviation      Wrist radial deviation      Wrist pronation      Wrist supination      (Blank rows = not tested)  HAND FUNCTION: 08/12/23: Grip strength: Right: 14 lbs, Left 60 lbs; lateral pinch: Right: 6 lbs, Left: 16 lbs , 3 point pinch: Right: 5 (IP flexion; S[place] pinch gauge), Left: 19 lbs  01/29/24 Eval: Grip strength: Right: 18 lbs; Left: 55 lbs, Lateral pinch: Right: 5 lbs, Left: 14 lbs, and 3 point pinch: Right:  Fingers slip- standard pinch gauge used 2 lbs, Left: 15 lbs  COORDINATION: 08/12/23: Right: 5 min and 19 sec; 2nd trial 3 min 49 sec, Left 23 sec,  01/29/24 Eval: 9 Hole Peg test: Right: 2 min 41 sec  sec; Left: 23 sec  SENSATION: Light touch: Impaired , tingling in the R hand   EDEMA: No visible edema  MUSCLE TONE: RUE: Mild and Moderate (Last round of Botox on 01/21/24)  COGNITION: Overall cognitive status: Within functional limits for tasks assessed  VISION: WFL; no recent changes  PERCEPTION: WFL  PRAXIS: Impaired: Motor planning and Clonus R hand  OBSERVATIONS:  Pt remains very motivated to increase functional use of RUE for daily tasks.                                                                                                              TREATMENT DATE: 03/11/24 Therapeutic  Activity: -Reapplied Ktape to R dorsal thumb, dorsal IF, and LF to minimize PIP and DIP flexion during pinching activities -Facilitated R hand FMC/dexterity skills working to [place] grooved pegs (downgraded; see clinical impression) -Facilitated R hand 3 point pinch and use of a lumbrical grasp working to place and remove jumbo pegs from pegboard; intermittent tactile cues for correct positioning. -Facilitated R hand 3 point pinch working with yellow and red clothespins, placing and removing pins on/off of a horizontal dowel; intermittent tactile cues for correct positioning.  PATIENT EDUCATION: Education details: progress towards goals Person educated: Patient Education method: Explanation Education comprehension: verbalized understanding  HOME EXERCISE PROGRAM: Pink theraputty  GOALS: Goals reviewed with patient? Yes  SHORT TERM GOALS: Target date: 03/11/24  Pt will be indep to perform HEP for improving R hand strength and coordination for daily tasks. Baseline: Eval: Not yet initiated Goal status: INITIAL   LONG TERM GOALS: Target date: 04/22/24  Pt will increase UEFS by 10 or more points to indicate self perceived functional improvement when engaging the RUE into daily tasks. Baseline: Eval: TBD Goal status: INITIAL  2.  Pt will increase R grip strength by 5 or more lbs to securely stabilize containers and jars for easier opening.  Baseline: Eval: R grip strength 18 lbs (L 55 lbs) Goal status: INITIAL  3.  Pt will increase R hand dexterity/FMC skills to manipulate clothing fasteners with bilat hands.  Baseline:  Goal status: INITIAL  4.  Pt will increase R active shoulder flexion to 140* or better to achieve functional ROM  for UB ADLs and reaching for ADL supplies.  Baseline: Eval: R shoulder flexion 125*; difficulty reaching overhead for ADL supplies Goal status: INITIAL  ASSESSMENT:  CLINICAL IMPRESSION: Pt continues to improve with R hand 3 point pinch  formation, but struggled with placing grooved pegs into board without frequent dropping.  Modified task with pt grasping a vertical grooved peg from OT 's hand to return to dish.  Transitioned to larger jumbo pegs to reduce dropping.  Ktape continues to improve R hand PIP and DIP extension in digits 1-3 to improve sustained 3 point pinch and lumbrical grasp patterns with less slipping of digits into flexion.  Pt continues to present with RUE pain, stiffness in the shoulder, muscle weakness, and lack of coordination, including clonus in R hand.  Pt will benefit from skilled OT to address RUE deficits in order to improve indep and efficiency when engaging the RUE into daily tasks.    PERFORMANCE DEFICITS: in functional skills including ADLs, IADLs, coordination, dexterity, sensation, tone, ROM, strength, pain, flexibility, Fine motor control, Gross motor control, mobility, balance, body mechanics, endurance, decreased knowledge of use of DME, and UE functional use, and psychosocial skills including coping strategies, environmental adaptation, habits, and routines and behaviors.   IMPAIRMENTS: are limiting patient from ADLs, IADLs, work, leisure, and social participation.   CO-MORBIDITIES: has co-morbidities such as CHF, asthma, depression that affects occupational performance. Patient will benefit from skilled OT to address above impairments and improve overall function.  MODIFICATION OR ASSISTANCE TO COMPLETE EVALUATION: No modification of tasks or assist necessary to complete an evaluation.  OT OCCUPATIONAL PROFILE AND HISTORY: Detailed assessment: Review of records and additional review of physical, cognitive, psychosocial history related to current functional performance.  CLINICAL DECISION MAKING: Moderate - several treatment options, min-mod task modification necessary  REHAB POTENTIAL: Good  EVALUATION COMPLEXITY: Moderate    PLAN:  OT FREQUENCY: 1-2x/week  OT DURATION: 12  weeks  PLANNED INTERVENTIONS: 97168 OT Re-evaluation, 97535 self care/ADL training, 13086 therapeutic exercise, 97530 therapeutic activity, 97112 neuromuscular re-education, 97140 manual therapy, 97010 moist heat, 97010 cryotherapy, passive range of motion, balance training, functional mobility training, psychosocial skills training, energy conservation, coping strategies training, patient/family education, and DME and/or AE instructions  RECOMMENDED OTHER SERVICES: Pt completed PT evaluation this date  CONSULTED AND AGREED WITH PLAN OF CARE: Patient  PLAN FOR NEXT SESSION: see above  Marcus Sewer, MS, OTR/L  Casandra Claw, OT 03/11/2024, 7:28 PM

## 2024-03-15 DIAGNOSIS — Z419 Encounter for procedure for purposes other than remedying health state, unspecified: Secondary | ICD-10-CM | POA: Diagnosis not present

## 2024-03-18 ENCOUNTER — Ambulatory Visit: Payer: Medicaid Other | Admitting: Physical Therapy

## 2024-03-18 ENCOUNTER — Ambulatory Visit: Payer: Medicaid Other

## 2024-03-18 DIAGNOSIS — M6281 Muscle weakness (generalized): Secondary | ICD-10-CM | POA: Diagnosis not present

## 2024-03-18 DIAGNOSIS — I6930 Unspecified sequelae of cerebral infarction: Secondary | ICD-10-CM

## 2024-03-18 DIAGNOSIS — R278 Other lack of coordination: Secondary | ICD-10-CM

## 2024-03-18 NOTE — Therapy (Unsigned)
 OUTPATIENT OCCUPATIONAL THERAPY NEURO TREATMENT NOTE  Patient Name: Anna Tapia MRN: [id number] DOB:[birth year], 33 y.o., female Today's Date: 03/19/2024  PCP: Dr. Rockney Cid  REFERRING PROVIDER: Dr. Rockney Cid  END OF SESSION:  OT End of Session - 03/19/24 1144     Visit Number 7    Number of Visits 24    Date for OT Re-Evaluation 04/22/24    Authorization Type [employer]; [employer] required    Authorization Time Period 12 OT visits approved between 02/05/24-05/02/24    Authorization - Visit Number 6    Authorization - Number of Visits 12    Progress Note Due on Visit 10    OT Start Time 1443    OT Stop Time 1537    OT Time Calculation (min) 54 min    Activity Tolerance Patient tolerated treatment well    Behavior During Therapy WFL for tasks assessed/performed            Past Medical History:  Diagnosis Date   Arteriovenous malformation of brain    a. s/p remote coiling.   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   Congenital CHF (congestive heart failure) (HCC)    a. 03/2023 Echo: EF 60-65%, no rwma, nl RV fxn, RVSP 29.53mmHg. Mild MR. Mild Ao sclerosis w/o stenosis. Ao root 38mm.   COVID 2022   Hemorrhagic stroke (HCC) 2023   a. L-sided midbrain, thalamic ICH in setting of PCA/ACA aneurysm s/p embolization.   History of broken collarbone 2020   Hydrocephalus (HCC)    a. s/p VP shunt in childhood.   Precordial chest pain    a. 03/2023 MV: No ischemia or infarct.  EF greater than 65%.  No significant coronary calcification.   Sepsis secondary to UTI (HCC) 2021   Past Surgical History:  Procedure Laterality Date   VENTRICULOPERITONEAL SHUNT     Patient Active Problem List   Diagnosis Date Noted   History of CVA with residual deficit 11/25/2023   CHF (congestive heart failure) (HCC) 03/25/2023   Left-sided nontraumatic intracerebral hemorrhage (HCC) 08/01/2022   AVM (arteriovenous malformation) brain  07/24/2022   Asthma 07/24/2022   Major depressive disorder, recurrent episode, moderate (HCC) 02/07/2022   Sepsis secondary to UTI (HCC) 05/01/2020   VP (ventriculoperitoneal) shunt status 05/01/2020   Right ovarian cyst 05/01/2020   Elevated LFTs 05/01/2020   ONSET DATE: Sept 2023  REFERRING DIAG: I69.30 (ICD-10-CM) - History of CVA with residual deficit   THERAPY DIAG:  Muscle weakness (generalized)  Other lack of coordination  History of CVA with residual deficit  Rationale for Evaluation and Treatment: Rehabilitation  SUBJECTIVE:  SUBJECTIVE STATEMENT: Pt and aunt inquired about hand splint d/t pt reporting ongoing tightness in her R hand, despite consistent stretching. Pt accompanied by: self, aunt  PERTINENT HISTORY: Pt known to this clinic after participation in therapy in 2024 following her L CVA in 2023.  Pt was discharged from OT in Oct of 2024 d/t visit limitations with therapy.  Pt returns today to address RUE residual deficits from CVA after most recent Botox injections on 01/21/24.  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 Yes: NPRS scale: 4-5/10 R shoulder from recent Botox  Pain location: R shoulder  Pain description: achy Aggravating factors: Botox shot Relieving factors: heat, rest  FALLS: Has patient fallen in last 6 months? No  LIVING ENVIRONMENT: Lives with: lives with their family mother and 3 brothers, but mostly staying with her aunt  Lives in: ground level apartment with aunt  Stairs: Yes: External: 3 steps; bilateral but cannot reach both Has following equipment at home: Single point cane  PLOF: Independent prior to CVA  PATIENT GOALS:  "Using my arm more."   OBJECTIVE:  Note: Objective measures were completed at Evaluation unless otherwise noted.  HAND DOMINANCE: Left  ADLs: Overall ADLs: Pt reports that she tries to engage the R hand into everything she does Transfers/ambulation related to ADLs: indep Eating: cuts food with L hand Grooming: increased time to engage the R  UB Dressing: Increased time with clothing fasteners LB Dressing: wears slip on shoes; wore jeans today and did ok with the clothing fasteners, extra time  Toileting: indep Bathing: occasional pain when reaching over with the R arm to bathe the L arm (R pec and shoulder pain) Tub Shower transfers: modified indep Equipment: Shower seat with back, Grab bars, and hand held shower hose   IADLs: Shopping: pt can go shopping accompanied by a family member  Light housekeeping: pt reports unable to sweep; pt reports she gets dizzy  Meal Prep: Pt reports she now cooks about 3 nights a week, but has difficulty opening containers/jars/food packages in the [employer] mobility: uses cane Medication management: modified indep; increased time to engage the R hand Financial management: mother manages (pt states she doesn't really have any bills)  Handwriting: NT (pt is L hand dominant)  MOBILITY STATUS: Uses cane for longer distances, but did not bring cane today  POSTURE COMMENTS:  rounded shoulders Sitting balance: Moves/returns truncal midpoint >2 inches in all planes  ACTIVITY TOLERANCE: Activity tolerance: Pt fatigues with IADLs and community mobility  FUNCTIONAL OUTCOME MEASURES: Upper Extremity Functional Scale (UEFS): TBD  UPPER EXTREMITY ROM:    Active ROM Left  Eval WNL throughout Right OT d/c on 08/12/23 (Previous episode of care) Right Eval on 01/29/24    Shoulder flexion  133 (135) 125 (134)   Shoulder abduction  120 (125) 90 (104)   Shoulder adduction      Shoulder extension      Shoulder internal rotation  R thumb to lumbar  spine (better clearance of SI joint) To R side of lower back-good clearance of SI joint   Shoulder external rotation  Hand to back of head without chin tuck and good abd  63* with arm abd (Slight scaption)   Elbow flexion      Elbow extension      Wrist flexion      Wrist extension      Wrist ulnar deviation      Wrist radial deviation      Wrist pronation      Wrist supination      (Blank rows = not tested)  UPPER EXTREMITY MMT:     MMT Left Eval 5/5 throughout Right OT d/c on 08/12/23 (Previous episode of care) Right Eval 01/29/24   Shoulder flexion  4- 4   Shoulder abduction  4- 4   Shoulder adduction      Shoulder extension      Shoulder internal rotation  4 4   Shoulder external rotation  4- 4  Middle trapezius      Lower trapezius      Elbow flexion  4+ 4+   Elbow extension  5 5   Wrist flexion  4+ 4+   Wrist extension  4+ 4+   Wrist ulnar deviation      Wrist radial deviation      Wrist pronation      Wrist supination      (Blank rows = not tested)  HAND FUNCTION: 08/12/23: Grip strength: Right: 14 lbs, Left 60 lbs; lateral pinch: Right: 6 lbs, Left: 16 lbs , 3 point pinch: Right: 5 (IP flexion; Saehan pinch gauge), Left: 19 lbs  01/29/24 Eval: Grip strength: Right: 18 lbs; Left: 55 lbs, Lateral pinch: Right: 5 lbs, Left: 14 lbs, and 3 point pinch: Right:  Fingers slip- standard pinch gauge used 2 lbs, Left: 15 lbs  COORDINATION: 08/12/23: Right: 5 min and 19 sec; 2nd trial 3 min 49 sec, Left 23 sec,  01/29/24 Eval: 9 Hole Peg test: Right: 2 min 41 sec  sec; Left: 23 sec  SENSATION: Light touch: Impaired , tingling in the R hand   EDEMA: No visible edema  MUSCLE TONE: RUE: Mild and Moderate (Last round of Botox on 01/21/24)  COGNITION: Overall cognitive status: Within functional limits for tasks assessed  VISION: WFL; no recent changes  PERCEPTION: WFL  PRAXIS: Impaired: Motor planning and Clonus R hand  OBSERVATIONS:  Pt remains very motivated to  increase functional use of RUE for daily tasks.                                                                                                              TREATMENT DATE: 03/18/24 Therapeutic Activity: -Reapplied Ktape to R dorsal thumb, dorsal IF, and LF to minimize PIP and DIP flexion during pinching activities -Facilitated R hand FMC/dexterity skills with manipulation of alphabet dice; attempted translatory skills, but unsuccessful so transitioned to sustaining a 2 and 3 point pinch pattern while reaching to pick up dice and position them in an alphabetized line across table -Facilitated R hand 2 and 3 point pinch patterns to thread washers over a diagonal dowel anchored in L hand.  Hand over hand assist from OT to secure set up of R hand digit positioning over washer.   Therapeutic Exercise: -Place and hold for lumbrical strengthening; light resistance applied with pt working to grasp note pad -Reviewed lumbrical strengthening exercises for HEP -Passive R thumb MP flexion and palmer abd  Self Care: -Condition management education: Educated on benefits and indications for resting hand splint per pt/family inquiry.  Recommendation for Softpro resting hand splint to be worn only at night time d/t pt reporting constant tightness in her hand.  Advised to avoid wearing splint during the day to promote use of R hand with daily activities -Pt inquired about obtaining compression stocking for RLE; OT offered options for obtaining off the shelf stocking to manage swelling in RLE  PATIENT EDUCATION: Education details: HEP review, condition management education for splinting and compression stockings  Person educated: Patient, aunt Education method: Explanation Education comprehension: verbalized understanding  HOME EXERCISE PROGRAM: Pink theraputty  GOALS: Goals reviewed with patient? Yes  SHORT TERM GOALS: Target date: 03/11/24  Pt will be indep to perform HEP for improving R hand strength  and coordination for daily tasks. Baseline: Eval: Not yet initiated Goal status: INITIAL   LONG TERM GOALS: Target date: 04/22/24  Pt will increase UEFS by 10 or more points to indicate self perceived functional improvement when engaging the RUE into daily tasks. Baseline: Eval: TBD Goal status: INITIAL  2.  Pt will increase R grip strength by 5 or more lbs to securely stabilize containers and jars for easier opening.  Baseline: Eval: R grip strength 18 lbs (L 55 lbs) Goal status: INITIAL  3.  Pt will increase R hand dexterity/FMC skills to manipulate clothing fasteners with bilat hands.  Baseline:  Goal status: INITIAL  4.  Pt will increase R active shoulder flexion to 140* or better to achieve functional ROM for UB ADLs and reaching for ADL supplies.  Baseline: Eval: R shoulder flexion 125*; difficulty reaching overhead for ADL supplies Goal status: INITIAL  ASSESSMENT:  CLINICAL IMPRESSION: Pt continues to find benefit from Ktape to better sustain 2 and 3 point pinch patterns with the R hand, allowing use of more mature grasp patterns when picking up small objects with the R hand.  Pt made good attempts to perform translatory movements with alphabet dice, working to move 1 dice from palm to fingertips, but repeated dropping occurred d/t clonus and overall limited dexterity skills.  Downgraded dice activity to focus on sustained 2 point and 3 point pinching.  Pt still fumbled with the dice, but was able to place 26 dice in a row using these prehension patterns.  Softpro resting hand splint order sent to MD d/t pt verbalizing tightness in hand even at night time.  Pt education provided on options to obtain compression stocking for RLE; pt and aunt plan to search for off the shelf stocking following OT visit to manage RLE swelling.  Pt will benefit from skilled OT to address RUE deficits in order to improve indep and efficiency when engaging the RUE into daily tasks.    PERFORMANCE  DEFICITS: in functional skills including ADLs, IADLs, coordination, dexterity, sensation, tone, ROM, strength, pain, flexibility, Fine motor control, Gross motor control, mobility, balance, body mechanics, endurance, decreased knowledge of use of DME, and UE functional use, and psychosocial skills including coping strategies, environmental adaptation, habits, and routines and behaviors.   IMPAIRMENTS: are limiting patient from ADLs, IADLs, work, leisure, and social participation.   CO-MORBIDITIES: has co-morbidities such as CHF, asthma, depression that affects occupational performance. Patient will benefit from skilled OT to address above impairments and improve overall function.  MODIFICATION OR ASSISTANCE TO COMPLETE EVALUATION: No modification of tasks or assist necessary to complete an evaluation.  OT OCCUPATIONAL PROFILE AND HISTORY: Detailed assessment: Review of records and additional review of physical, cognitive, psychosocial history related to current functional performance.  CLINICAL DECISION MAKING: Moderate - several treatment options, min-mod task modification necessary  REHAB POTENTIAL: Good  EVALUATION COMPLEXITY: Moderate    PLAN:  OT FREQUENCY: 1-2x/week  OT DURATION: 12 weeks  PLANNED INTERVENTIONS: 97168 OT Re-evaluation, 97535 self care/ADL training, 16109 therapeutic exercise, 97530 therapeutic activity, 97112 neuromuscular re-education, 97140 manual therapy, 97010 moist heat, 97010 cryotherapy, passive range of motion, balance training, functional mobility training, psychosocial skills training, energy conservation, coping strategies training, patient/family education, and DME and/or  AE instructions  RECOMMENDED OTHER SERVICES: Pt completed PT evaluation this date  CONSULTED AND AGREED WITH PLAN OF CARE: Patient  PLAN FOR NEXT SESSION: see above  Marcus Sewer, MS, OTR/L  Casandra Claw, OT 03/19/2024, 11:51 AM

## 2024-03-25 ENCOUNTER — Ambulatory Visit: Payer: Medicaid Other | Admitting: Physical Therapy

## 2024-03-25 ENCOUNTER — Ambulatory Visit: Payer: Medicaid Other

## 2024-03-25 DIAGNOSIS — M6281 Muscle weakness (generalized): Secondary | ICD-10-CM

## 2024-03-25 DIAGNOSIS — I6930 Unspecified sequelae of cerebral infarction: Secondary | ICD-10-CM

## 2024-03-25 DIAGNOSIS — R262 Difficulty in walking, not elsewhere classified: Secondary | ICD-10-CM

## 2024-03-25 DIAGNOSIS — R2681 Unsteadiness on feet: Secondary | ICD-10-CM

## 2024-03-25 DIAGNOSIS — R2689 Other abnormalities of gait and mobility: Secondary | ICD-10-CM

## 2024-03-25 DIAGNOSIS — R278 Other lack of coordination: Secondary | ICD-10-CM

## 2024-03-25 NOTE — Therapy (Signed)
 OUTPATIENT PHYSICAL THERAPY NEURO TREATMENT Progress Note.    Patient Name: Anna Tapia MRN: [id number] DOB:08-18-1991, 33 y.o., female Today's Date: 03/25/2024   PCP: Rockney Cid, DO  REFERRING PROVIDER: Rockney Cid, DO   END OF SESSION:  PT End of Session - 03/25/24 1408     Visit Number 8    Number of Visits 24    Date for PT Re-Evaluation 04/22/24    PT Start Time 1406    PT Stop Time 1445    PT Time Calculation (min) 39 min    Equipment Utilized During Treatment Gait belt    Activity Tolerance Patient tolerated treatment well;No increased pain    Behavior During Therapy WFL for tasks assessed/performed             Past Medical History:  Diagnosis Date   Arteriovenous malformation of brain    a. s/p remote coiling.   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   Congenital CHF (congestive heart failure) (HCC)    a. 03/2023 Echo: EF 60-65%, no rwma, nl RV fxn, RVSP 29.23mmHg. Mild MR. Mild Ao sclerosis w/o stenosis. Ao root 38mm.   COVID 2022   Hemorrhagic stroke (HCC) 2023   a. L-sided midbrain, thalamic ICH in setting of PCA/ACA aneurysm s/p embolization.   History of broken collarbone 2020   Hydrocephalus (HCC)    a. s/p VP shunt in childhood.   Precordial chest pain    a. 03/2023 MV: No ischemia or infarct.  EF greater than 65%.  No significant coronary calcification.   Sepsis secondary to UTI (HCC) 2021   Past Surgical History:  Procedure Laterality Date   VENTRICULOPERITONEAL SHUNT     Patient Active Problem List   Diagnosis Date Noted   History of CVA with residual deficit 11/25/2023   CHF (congestive heart failure) (HCC) 03/25/2023   Left-sided nontraumatic intracerebral hemorrhage (HCC) 08/01/2022   AVM (arteriovenous malformation) brain 07/24/2022   Asthma 07/24/2022   Major depressive disorder, recurrent episode, moderate (HCC) 02/07/2022   Sepsis secondary to UTI (HCC) 05/01/2020   VP  (ventriculoperitoneal) shunt status 05/01/2020   Right ovarian cyst 05/01/2020   Elevated LFTs 05/01/2020    ONSET DATE: September  2023  REFERRING DIAG:  Diagnosis  I69.30 (ICD-10-CM) - History of CVA with residual deficit    THERAPY DIAG:  Muscle weakness (generalized)  Other lack of coordination  History of CVA with residual deficit  Difficulty in walking, not elsewhere classified  Other abnormalities of gait and mobility  Balance disorder  Unsteadiness on feet  Rationale for Evaluation and Treatment: Rehabilitation  SUBJECTIVE:  SUBJECTIVE STATEMENT:  Pt reports that she is doing well. Feels like she is moving around a little better and her endurance is improving.     Pt accompanied by: self  PERTINENT HISTORY:  From recent MD visit:  [name] presents to follow up on chronic medical conditions. Having some knee pain today, had been doing well with PT but her insurance is no longer paying for this until the new year.    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  Also on Lyrica 50 mg BID  -Difficulty using using right hand and right side of face - completed home PT/OT but interested in continuing with therapy outside the home.  Hx of CHF as a newborn: -Had been following with Cardiology at [facility], last seen in 2018 -Last echo 9/23 EF 58% -Denies chest pain, palpitations, shortness of breath or lower extremity swelling  PAIN:  Are  you having pain? Yes: NPRS scale: 4/10 Pain location: R shoulder  Pain description: dull pain  Aggravating factors: moving arm  Relieving factors: n/a   PRECAUTIONS: Fall  RED FLAGS: None   WEIGHT BEARING RESTRICTIONS: No  FALLS: Has patient fallen in last 6 months? No  LIVING ENVIRONMENT: Lives with: lives with their family Lives in: House/apartment Stairs: Yes: External: 2 steps; on right going up and on left going up Has following equipment at home: Single point cane  PLOF: Independent, Independent with basic ADLs, and prior to CVA   PATIENT GOALS: improve walking. "Get around better"   OBJECTIVE:  Note: Objective measures were completed at Evaluation unless otherwise noted.  DIAGNOSTIC FINDINGS:   CT 2023 IMPRESSION: 1. 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 Again, prominent arterial contribution to the AVM is seen from the left ACA and posterior circulation, with primary venous drainage into the deep venous system. Associated 7 mm aneurysm along the right anterolateral aspect of the AVM as above. 2. Diffuse tortuosity and ectasia elsewhere about the major arterial vasculature of the head and neck. No large vessel occlusion or hemodynamically significant stenosis. No other acute vascular abnormality.    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   EDEMA:  Circumferential:  distal to knee in sitting R: 41.6CM L 39cm  MUSCLE TONE: RLE: Mild noted increased tone with gait and mobility.   DTRs:  Patella 1 = Trace   POSTURE: rounded shoulders, forward head, and left pelvic obliquity  LOWER EXTREMITY ROM:     Active  Right Eval Left Eval  Hip flexion [facility]  Hip extension [facility]  Hip abduction    Hip adduction    Hip internal  rotation    Hip external rotation    Knee flexion 110 WFL  Knee extension [facility]  Ankle dorsiflexion 8 15  Ankle plantarflexion [facility]  Ankle inversion    Ankle eversion     (Blank rows = not tested)  LOWER EXTREMITY MMT:  03/25/24 MMT Right Eval Left Eval  Hip flexion 4+ 4+  Hip extension    Hip abduction 5 5  Hip adduction 5 5  Hip internal rotation    Hip external rotation    Knee flexion 4+ 5  Knee extension 5 5  Ankle dorsiflexion 4 5  Ankle plantarflexion    Ankle inversion    Ankle eversion    (Blank rows = not tested)  BED MOBILITY:  Sit to supine SBA Supine to sit SBA Rolling to Right SBA Rolling to Left SBA  TRANSFERS: Assistive device utilized: None  Sit to stand: SBA Stand to sit: SBA Chair to chair: SBA Floor: need to assess    CURB:  Level of Assistance: Modified independence and SBA Assistive device utilized: rail  Curb Comments: step to descent   STAIRS: Level of Assistance: Modified independence Stair Negotiation Technique: Step to Pattern with descent and Single Rail on Left Number of Stairs: 4  Height of Stairs: 6  Comments: mild posterior bias on descent. And step to pattern   GAIT: Gait pattern: step through pattern, decreased stance time- Right, Right hip hike, trunk rotated posterior- Left, wide BOS, and poor foot clearance- Right Distance walked: 60 Assistive device utilized: None Level of assistance: Modified independence and SBA Comments: see description  FUNCTIONAL TESTS:  See Goal assessment.   PATIENT SURVEYS:  ABC scale 48.75 03/25/24: 62.5                                                                                                                             TREATMENT DATE: 01/29/2024  PT instructed pt in goal assessment to measure progress towards LTGs.   Pt performed 5 time sit<>stand (5xSTS): 11.91sec without UE support (13.06sec,10.76 sec;>15 sec indicates increased fall risk)   PT instructed pt in TUG:  8.764sec without UE support (9.08, 8.45 sec average of 2 trials; >13.5 sec indicates increased fall risk)  6 Min Walk Test:  Instructed patient to ambulate as quickly and as safely as possible for 6 minutes using LRAD. Patient was allowed to take standing rest breaks without stopping the test, but if the patient required a sitting rest break the clock would be stopped and the test would be over.  Results: 962ft without AD no rest break required. Results indicate that the patient has reduced endurance with ambulation compared to age matched norms.  Age Matched Norms: 52-69 yo M: 34 F: 62, 20-79 yo M: 29 F: 471, 46-89 yo M: 417 F: 392 MDC: 58.21 meters (190.98 feet) or 50 meters (ANPTA Core Set of Outcome Measures for Adults with Neurologic Conditions, 2018)  10 Meter Walk Test: Patient instructed to walk 10 meters (32.8 ft) as quickly and as safely as possible at their normal speed x2 and at a fast speed x2. Time measured from 2 meter mark to 8 meter mark to accommodate ramp-up and ramp-down.  Normal speed 1: 11.8sec Normal speed 2: 11.3sec Average Normal  speed: 0.87 m/s Fast speed 1: 9.3sec   Fast speed 2: 9.8sec  Average Fast speed: 1.03 m/s Cut off scores: <0.4 m/s = household Ambulator, 0.4-0.8 m/s = limited community Ambulator, >0.8 m/s = community Ambulator, >1.2 m/s = crossing a street, <1.0 = increased fall risk MCID 0.05 m/s (small), 0.13 m/s (moderate), 0.06 m/s (significant)  (ANPTA Core Set of Outcome Measures for Adults with Neurologic Conditions, 2018)  PT educated pt and Aunt in interpretation and understanding of standardized outcomes as it relates to prognosis, functional mobility, safety, fall risk and community access.   PATIENT EDUCATION: Education details: POC.  Person educated: Patient Education method: Medical illustrator Education comprehension: verbalized understanding  HOME EXERCISE PROGRAM: Access Code: [email or web address] URL:  [email or web address] Date: 02/19/2024 Prepared by: Aurora Lees  Exercises - Seated March with Ankle Weights at Foot  - 1 x daily - 7 x weekly - 3 sets - 10 reps - Side Stepping with Resistance at Feet  - 1 x daily - 7 x weekly - 3 sets - 10 reps - Tandem Stance  - 1 x daily - 5 x weekly - 3 sets - 4 reps - 20 hold - Staggered Sit-to-Stand  - 1 x daily - 5 x weekly - 2 sets - 5 reps - Sit to Stand with Arms Crossed  - 1 x daily - 5 x weekly - 3 sets - 10 reps - Seated Knee Extension with Resistance  - 1 x daily - 7 x weekly - 3 sets - 10 reps - Seated Hip Abduction with Resistance  - 1 x daily - 7 x weekly - 3 sets - 10 reps - Standing Single Leg Stance with Counter Support  - 1 x daily - 7 x weekly - 3 sets - 10 reps  GOALS: Goals reviewed with patient? Yes  SHORT TERM GOALS: Target date: 02/27/2024  Patient will be independent in home exercise program to improve strength/mobility for better functional independence with ADLs. Baseline: to be given on visit 2  5/:22: HEP provided and reviewed since 4/17 Goal status: MET  LONG TERM GOALS: Target date: 04/23/2024  Patient will increase ABC score to equal to or greater than   10%  to demonstrate statistically significant improvement in mobility and quality of life.  Baseline: 48.75 5/22: 62.5% Goal status: MET  2.  Patient (> 37 years old) will complete five times sit to stand test in < 11 seconds indicating an increased LE strength and improved balance. Baseline: 12.41sec 5/22: 11.91sec without UE support average of 2 trials. Goal status: IN PROGRESS  3.  Patient will increase 6 min walk test by >170ft points to demonstrate decreased fall risk during functional activities Baseline: 974ft without AD. No rest break needed.  5/22: 989ft without AD no rest break required.  Goal status: IN PROGRESS  4.  Patient will increase 10 meter walk test to >1.67m/s as to improve gait speed for better community ambulation and to  reduce fall risk. Baseline: 0.66m/s 5/22:Average Normal speed: 0.87 m/s; Average Fast speed: 1.03 m/s Goal status: IN PROGRESS  5.  Patient will reduce timed up and go to <10 seconds to reduce fall risk and demonstrate improved transfer/gait ability. Baseline: 10.94 5/22:9.08 sec without UE support  Goal status: MET  6.  Patient will increase dynamic gait index score to >24 as to demonstrate reduced fall risk and improved dynamic gait balance for better safety with community/home ambulation.   Baseline: 20 Goal status: IN PROGRESS  ASSESSMENT:  CLINICAL IMPRESSION:  Pt put forth good effort throughout the session with re-assessment of LTG to measure functional progress. Pt demonstrates consistent improvement in all standardized outcomes assessed meeting 1 of 1 STGs and 2 of 6 LTG, with improved ABC by >10points reduced TUG to <10. Pt has improved 5x STS by < 1 sec, improved 6 min walk test >49ft, as well increased normal gait speed to>0.59m/s indicating reduced fall risk, but still limited community ambulation for aged matched norms. Patient's condition has the potential to improve in response to therapy. Maximum improvement is yet to be obtained. The anticipated improvement is attainable and reasonable in a generally predictable time. Pt will continue to benefit from skilled therapy to address remaining deficits in order to improve overall QoL and return to PLOF.     OBJECTIVE IMPAIRMENTS: Abnormal gait, cardiopulmonary status limiting activity, decreased activity tolerance, decreased balance, decreased coordination, decreased endurance, decreased knowledge of use of DME, decreased mobility, difficulty walking, decreased ROM, decreased strength, hypomobility, increased edema, increased fascial restrictions, increased muscle spasms, impaired flexibility, impaired sensation, impaired tone, impaired UE functional use, improper body mechanics, postural dysfunction, and pain.   ACTIVITY  LIMITATIONS: carrying, lifting, bending, squatting, stairs, transfers, bed mobility, dressing, and locomotion level  PARTICIPATION LIMITATIONS: cleaning, laundry, interpersonal relationship, driving, shopping, community activity, occupation, and yard work  PERSONAL FACTORS: 1-2 comorbidities: hs of CHF and CVA are also affecting patient's functional outcome.   REHAB POTENTIAL: Good  CLINICAL DECISION MAKING: Evolving/moderate complexity  EVALUATION COMPLEXITY: Moderate  PLAN:  PT FREQUENCY: 1-2x/week  PT DURATION: 12 weeks  PLANNED INTERVENTIONS: 97110-Therapeutic exercises, 97530- Therapeutic activity, 97112- Neuromuscular re-education, 97535- Self Care, 16109- Manual therapy, [phone]- Gait training, [phone]- Orthotic Fit/training, [phone]- Splinting, [phone]- Electrical stimulation (unattended), [phone]- Electrical stimulation (manual), Patient/Family education, Balance training, Stair training, Taping, Dry Needling, Joint mobilization, Joint manipulation, Spinal manipulation, Spinal mobilization, Vestibular training, Visual/preceptual remediation/compensation, DME instructions, Cryotherapy, and Moist heat  PLAN FOR NEXT SESSION:   Continue Dynamic balance training.  R ankle and gluteal strengthening   Aurora Lees PT, DPT  Physical Therapist - [facility]  [facility]  2:11 PM 03/25/24

## 2024-03-25 NOTE — Therapy (Signed)
 OUTPATIENT OCCUPATIONAL THERAPY NEURO TREATMENT NOTE  Patient Name: Anna Tapia MRN: [id number] DOB:August 31, 1991, 33 y.o., female Today's Date: 03/26/2024  PCP: Dr. Rockney Cid  REFERRING PROVIDER: Dr. Rockney Cid  END OF SESSION:  OT End of Session - 03/26/24 1259     Visit Number 8    Number of Visits 24    Date for OT Re-Evaluation 04/22/24    Authorization Type [employer]; [employer] required    Authorization Time Period 12 OT visits approved between 02/05/24-05/02/24    Authorization - Visit Number 7    Authorization - Number of Visits 12    Progress Note Due on Visit 10    OT Start Time 1445    OT Stop Time 1530    OT Time Calculation (min) 45 min    Activity Tolerance Patient tolerated treatment well    Behavior During Therapy WFL for tasks assessed/performed            Past Medical History:  Diagnosis Date   Arteriovenous malformation of brain    a. s/p remote coiling.   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   Congenital CHF (congestive heart failure) (HCC)    a. 03/2023 Echo: EF 60-65%, no rwma, nl RV fxn, RVSP 29.2mmHg. Mild MR. Mild Ao sclerosis w/o stenosis. Ao root 38mm.   COVID 2022   Hemorrhagic stroke (HCC) 2023   a. L-sided midbrain, thalamic ICH in setting of PCA/ACA aneurysm s/p embolization.   History of broken collarbone 2020   Hydrocephalus (HCC)    a. s/p VP shunt in childhood.   Precordial chest pain    a. 03/2023 MV: No ischemia or infarct.  EF greater than 65%.  No significant coronary calcification.   Sepsis secondary to UTI (HCC) 2021   Past Surgical History:  Procedure Laterality Date   VENTRICULOPERITONEAL SHUNT     Patient Active Problem List   Diagnosis Date Noted   History of CVA with residual deficit 11/25/2023   CHF (congestive heart failure) (HCC) 03/25/2023   Left-sided nontraumatic intracerebral hemorrhage (HCC) 08/01/2022   AVM (arteriovenous malformation) brain  07/24/2022   Asthma 07/24/2022   Major depressive disorder, recurrent episode, moderate (HCC) 02/07/2022   Sepsis secondary to UTI (HCC) 05/01/2020   VP (ventriculoperitoneal) shunt status 05/01/2020   Right ovarian cyst 05/01/2020   Elevated LFTs 05/01/2020   ONSET DATE: Sept 2023  REFERRING DIAG: I69.30 (ICD-10-CM) - History of CVA with residual deficit   THERAPY DIAG:  Muscle weakness (generalized)  Other lack of coordination  History of CVA with residual deficit  Rationale for Evaluation and Treatment: Rehabilitation  SUBJECTIVE:  SUBJECTIVE STATEMENT: Pt inquired about order for resting hand splint.  Order not yet signed but faxed again today.  (Signed by end of session this date) Pt accompanied by: self, aunt  PERTINENT HISTORY: Pt known to this clinic after participation in therapy in 2024 following her L CVA in 2023.  Pt was discharged from OT in Oct of 2024 d/t visit limitations with therapy.  Pt returns today to address RUE residual deficits from CVA after most recent Botox injections on 01/21/24.  Hx of left-sided mid-brain and thalamic ICH in 9/23: -Also history of known vein of galen malformation s/p embolization of PCA and ACA feeders 07/29/22 with history of partial embolization as an infant in 1992  PRECAUTIONS: None  WEIGHT BEARING RESTRICTIONS: No  PAIN: 03/25/24: 1-2/10 dull pain in R knee, 4/10  pain R shoulder Are you having pain? Yes: NPRS scale: 4-5/10 R shoulder from recent Botox  Pain location: R shoulder  Pain description: achy Aggravating factors: Botox shot Relieving factors: heat, rest  FALLS: Has patient fallen in last 6 months? No  LIVING ENVIRONMENT: Lives with: lives with their family mother and 3 brothers, but mostly staying with her aunt  Lives in: ground level apartment with aunt  Stairs: Yes: External: 3 steps; bilateral but cannot reach both Has following equipment at home: Single point cane  PLOF: Independent prior to CVA  PATIENT  GOALS: "Using my arm more."   OBJECTIVE:  Note: Objective measures were completed at Evaluation unless otherwise noted.  HAND DOMINANCE: Left  ADLs: Overall ADLs: Pt reports that she tries to engage the R hand into everything she does Transfers/ambulation related to ADLs: indep Eating: cuts food with L hand Grooming: increased time to engage the R  UB Dressing: Increased time with clothing fasteners LB Dressing: wears slip on shoes; wore jeans today and did ok with the clothing fasteners, extra time  Toileting: indep Bathing: occasional pain when reaching over with the R arm to bathe the L arm (R pec and shoulder pain) Tub Shower transfers: modified indep Equipment: Shower seat with back, Grab bars, and hand held shower hose   IADLs: Shopping: pt can go shopping accompanied by a family member  Light housekeeping: pt reports unable to sweep; pt reports she gets dizzy  Meal Prep: Pt reports she now cooks about 3 nights a week, but has difficulty opening containers/jars/food packages in the [employer] mobility: uses cane Medication management: modified indep; increased time to engage the R hand Financial management: mother manages (pt states she doesn't really have any bills)  Handwriting: NT (pt is L hand dominant)  MOBILITY STATUS: Uses cane for longer distances, but did not bring cane today  POSTURE COMMENTS:  rounded shoulders Sitting balance: Moves/returns truncal midpoint >2 inches in all planes  ACTIVITY TOLERANCE: Activity tolerance: Pt fatigues with IADLs and community mobility  FUNCTIONAL OUTCOME MEASURES: Upper Extremity Functional Scale (UEFS): TBD  UPPER EXTREMITY ROM:    Active ROM Left  Eval WNL throughout Right OT d/c on 08/12/23 (Previous episode of care) Right Eval on 01/29/24    Shoulder flexion  133 (135) 125 (134)   Shoulder abduction  120 (125) 90 (104)   Shoulder adduction      Shoulder extension      Shoulder internal rotation  R thumb to  lumbar spine (better clearance of SI joint) To R side of lower back-good clearance of SI joint   Shoulder external rotation  Hand to back of head without chin tuck and good abd  63* with arm abd (Slight scaption)   Elbow flexion      Elbow extension      Wrist flexion      Wrist extension      Wrist ulnar deviation      Wrist radial deviation      Wrist pronation      Wrist supination      (Blank rows = not tested)  UPPER EXTREMITY MMT:     MMT Left Eval 5/5 throughout Right OT d/c on 08/12/23 (Previous episode of care) Right Eval 01/29/24   Shoulder flexion  4- 4   Shoulder abduction  4- 4   Shoulder adduction      Shoulder extension      Shoulder internal rotation  4 4   Shoulder external  rotation  4- 4   Middle trapezius      Lower trapezius      Elbow flexion  4+ 4+   Elbow extension  5 5   Wrist flexion  4+ 4+   Wrist extension  4+ 4+   Wrist ulnar deviation      Wrist radial deviation      Wrist pronation      Wrist supination      (Blank rows = not tested)  HAND FUNCTION: 08/12/23: Grip strength: Right: 14 lbs, Left 60 lbs; lateral pinch: Right: 6 lbs, Left: 16 lbs , 3 point pinch: Right: 5 (IP flexion; Saehan pinch gauge), Left: 19 lbs  01/29/24 Eval: Grip strength: Right: 18 lbs; Left: 55 lbs, Lateral pinch: Right: 5 lbs, Left: 14 lbs, and 3 point pinch: Right:  Fingers slip- standard pinch gauge used 2 lbs, Left: 15 lbs  COORDINATION: 08/12/23: Right: 5 min and 19 sec; 2nd trial 3 min 49 sec, Left 23 sec,  01/29/24 Eval: 9 Hole Peg test: Right: 2 min 41 sec  sec; Left: 23 sec  SENSATION: Light touch: Impaired , tingling in the R hand   EDEMA: No visible edema  MUSCLE TONE: RUE: Mild and Moderate (Last round of Botox on 01/21/24)  COGNITION: Overall cognitive status: Within functional limits for tasks assessed  VISION: WFL; no recent changes  PERCEPTION: WFL  PRAXIS: Impaired: Motor planning and Clonus R hand  OBSERVATIONS:  Pt remains very motivated  to increase functional use of RUE for daily tasks.                                                                                                              TREATMENT DATE: 03/25/24 Therapeutic Activity: -Reapplied Ktape to R dorsal thumb, dorsal IF, and LF to minimize PIP and DIP flexion during pinching activities -Facilitated R hand FMC/dexterity skills practicing sustained 2 point pinch, 3 point pinch, and lumbrical grasp when placing/removing jumbo pegs from pegboard.  Hand over hand assist from OT to formulate above noted grasp patterns over pegs for ~50% of trials.   Therapeutic Exercise: -R grip strengthening: Hand gripper set at 6.6# to remove jumbo pegs from pegboard for 2 trials; min vc for hand position on hand gripper to reduce dropped pegs. -Passive MP/PIP extension and metacarpal spread stretches for reducing hand stiffness -Passive R thumb MP flexion and palmer abd  PATIENT EDUCATION: Education details: HEP review  Person educated: Patient, aunt Education method: Explanation Education comprehension: verbalized understanding  HOME EXERCISE PROGRAM: Pink theraputty, self passive stretching for R hand digit ext/thumb flex/abd  GOALS: Goals reviewed with patient? Yes  SHORT TERM GOALS: Target date: 03/11/24  Pt will be indep to perform HEP for improving R hand strength and coordination for daily tasks. Baseline: Eval: Not yet initiated Goal status: INITIAL   LONG TERM GOALS: Target date: 04/22/24  Pt will increase UEFS by 10 or more points to indicate self perceived functional improvement when engaging the RUE into daily tasks. Baseline: Eval: TBD Goal status:  INITIAL  2.  Pt will increase R grip strength by 5 or more lbs to securely stabilize containers and jars for easier opening.  Baseline: Eval: R grip strength 18 lbs (L 55 lbs) Goal status: INITIAL  3.  Pt will increase R hand dexterity/FMC skills to manipulate clothing fasteners with bilat hands.  Baseline:   Goal status: INITIAL  4.  Pt will increase R active shoulder flexion to 140* or better to achieve functional ROM for UB ADLs and reaching for ADL supplies.  Baseline: Eval: R shoulder flexion 125*; difficulty reaching overhead for ADL supplies Goal status: INITIAL  ASSESSMENT:  CLINICAL IMPRESSION: Pt reports that she was able to obtain off the shelf compression stockings for the RLE, verbalized wearing them daily, and finding improvement in the RLE swelling with these stockings.  Order re-faxed for Softpro resting hand splint.  Order was signed by end of session and faxed to [facility].  Pt continues to find benefit from Ktape to better sustain 2 and 3 point pinch patterns with the R hand, allowing use of more mature grasp patterns when picking up small objects with the R hand.  Pt still requires intermittent hand over hand assist to initiate lumbrical grasp, but is able to initiate independently after several reps with assist, though sustaining this prehension is not yet consistent, contributing to frequent dropping of smaller items from hand when fingers slip from position.  Pt will continue to benefit from skilled OT to address RUE deficits in order to improve indep and efficiency when engaging the RUE into daily tasks.    PERFORMANCE DEFICITS: in functional skills including ADLs, IADLs, coordination, dexterity, sensation, tone, ROM, strength, pain, flexibility, Fine motor control, Gross motor control, mobility, balance, body mechanics, endurance, decreased knowledge of use of DME, and UE functional use, and psychosocial skills including coping strategies, environmental adaptation, habits, and routines and behaviors.   IMPAIRMENTS: are limiting patient from ADLs, IADLs, work, leisure, and social participation.   CO-MORBIDITIES: has co-morbidities such as CHF, asthma, depression that affects occupational performance. Patient will benefit from skilled OT to address above impairments and improve  overall function.  MODIFICATION OR ASSISTANCE TO COMPLETE EVALUATION: No modification of tasks or assist necessary to complete an evaluation.  OT OCCUPATIONAL PROFILE AND HISTORY: Detailed assessment: Review of records and additional review of physical, cognitive, psychosocial history related to current functional performance.  CLINICAL DECISION MAKING: Moderate - several treatment options, min-mod task modification necessary  REHAB POTENTIAL: Good  EVALUATION COMPLEXITY: Moderate    PLAN:  OT FREQUENCY: 1-2x/week  OT DURATION: 12 weeks  PLANNED INTERVENTIONS: 97168 OT Re-evaluation, 97535 self care/ADL training, 16109 therapeutic exercise, 97530 therapeutic activity, 97112 neuromuscular re-education, 97140 manual therapy, 97010 moist heat, 97010 cryotherapy, passive range of motion, balance training, functional mobility training, psychosocial skills training, energy conservation, coping strategies training, patient/family education, and DME and/or AE instructions  RECOMMENDED OTHER SERVICES: Pt completed PT evaluation this date  CONSULTED AND AGREED WITH PLAN OF CARE: Patient  PLAN FOR NEXT SESSION: see above  Marcus Sewer, MS, OTR/L  Casandra Claw, OT 03/26/2024, 1:02 PM

## 2024-03-26 ENCOUNTER — Encounter: Payer: Self-pay | Admitting: Internal Medicine

## 2024-03-30 ENCOUNTER — Telehealth: Payer: Self-pay | Admitting: Pharmacy Technician

## 2024-03-30 ENCOUNTER — Other Ambulatory Visit: Payer: Self-pay | Admitting: Internal Medicine

## 2024-03-30 ENCOUNTER — Other Ambulatory Visit (HOSPITAL_COMMUNITY): Payer: Self-pay

## 2024-03-30 DIAGNOSIS — J45909 Unspecified asthma, uncomplicated: Secondary | ICD-10-CM

## 2024-03-30 MED ORDER — FLUTICASONE-SALMETEROL 100-50 MCG/ACT IN AEPB: 1.0000 | INHALATION_SPRAY | Freq: Two times a day (BID) | RESPIRATORY_TRACT | 3 refills | Status: DC

## 2024-03-30 NOTE — Telephone Encounter (Signed)
 Pharmacy Patient Advocate Encounter   Received notification from CoverMyMeds that prior authorization for Fluticasone -Salmeterol 100-50MCG/ACT aerosol powder is required/requested.   Insurance verification completed.   The patient is insured through [facility] [place] .   Per test claim:  NAME Anna Tapia is preferred by the insurance.  If suggested medication is appropriate, Please send in a new RX and discontinue this one. If not, please advise as to why it's not appropriate so that we may request a Prior Authorization. Please note, some preferred medications may still require a PA.  If the suggested medications have not been trialed and there are no contraindications to their use, the PA will not be submitted, as it will not be approved.

## 2024-04-01 ENCOUNTER — Ambulatory Visit: Payer: Medicaid Other

## 2024-04-01 ENCOUNTER — Ambulatory Visit: Payer: Medicaid Other | Admitting: Physical Therapy

## 2024-04-01 ENCOUNTER — Other Ambulatory Visit (HOSPITAL_COMMUNITY): Payer: Self-pay

## 2024-04-02 ENCOUNTER — Ambulatory Visit: Admitting: Internal Medicine

## 2024-04-02 ENCOUNTER — Encounter: Payer: Self-pay | Admitting: Internal Medicine

## 2024-04-02 ENCOUNTER — Other Ambulatory Visit: Payer: Self-pay

## 2024-04-02 VITALS — BP 128/82 | HR 97 | Temp 98.3°F | Resp 16 | Ht 60.0 in | Wt 181.4 lb

## 2024-04-02 DIAGNOSIS — Z0001 Encounter for general adult medical examination with abnormal findings: Secondary | ICD-10-CM | POA: Diagnosis not present

## 2024-04-02 DIAGNOSIS — Z13220 Encounter for screening for lipoid disorders: Secondary | ICD-10-CM

## 2024-04-02 DIAGNOSIS — Z7689 Persons encountering health services in other specified circumstances: Secondary | ICD-10-CM | POA: Diagnosis not present

## 2024-04-02 DIAGNOSIS — L689 Hypertrichosis, unspecified: Secondary | ICD-10-CM | POA: Diagnosis not present

## 2024-04-02 DIAGNOSIS — Z0000 Encounter for general adult medical examination without abnormal findings: Secondary | ICD-10-CM

## 2024-04-02 DIAGNOSIS — R238 Other skin changes: Secondary | ICD-10-CM

## 2024-04-02 DIAGNOSIS — Z23 Encounter for immunization: Secondary | ICD-10-CM

## 2024-04-02 NOTE — Progress Notes (Signed)
 Name: Anna Tapia   MRN: [id number]    DOB: 16-Feb-1991   Date:04/02/2024       Progress Note  Subjective  Chief Complaint  Chief Complaint  Patient presents with   Annual Exam    HPI  Patient presents for annual CPE.  Discussed the use of AI scribe software for clinical note transcription with the patient, who gave verbal consent to proceed.  History of Present Illness Anna Tapia "Anna Tapia" is a 33 year old female who presents for a follow-up on physical and evaluation of a painful rash.  In March 2023, she had a positive HPV test with negative cervical cells and trichomonas. In May 2024, her Pap smear showed normal cervical cells and a negative HPV test. She is not due for another Pap smear until 2027.  She has a painful rash on the labia, which is flat and not discolored. There is no itchiness, but pain relief occurs with rubbing. She uses Dial antibacterial soap. There are no changes in vaginal discharge or internal discomfort.  Her blood pressure during the visit is 128/82 mmHg. She reports higher readings of 167/97 mmHg the previous night and morning, with associated malaise. No stress was noted during these elevated readings.  She inquires about medication refills for an inhaler and duloxetine , which were recently refilled, and mentions baclofen , which has a refill available.  Her diet mainly consists of junk food, though she cooks at home. She exercises by walking briskly for about ten minutes four days a week but tires easily.   Diet: Regular - eating more junk food but does cook at home Exercise: 4 days 15 minutes - brisk walking  Last Eye Exam: will schedule - discussed importance of yearly eye exams to make sure prescription lenses are up to date Last Dental Exam: will schedule - will schedule twice yearly dental cleanings   Flowsheet Row Office Visit from 04/02/2024 in [facility]  AUDIT-C Score 0      Depression: Phq 9 is   negative    04/02/2024    8:29 AM 11/25/2023    1:11 PM 08/21/2023    1:37 PM 07/10/2023   12:49 PM 04/22/2023   10:53 AM  Depression screen PHQ 2/9  Decreased Interest 0 1 1 3  0  Down, Depressed, Hopeless 0 1 1 3  0  PHQ - 2 Score 0 2 2 6  0  Altered sleeping  1 3 3  0  Tired, decreased energy  1 3 3  0  Change in appetite  0 0 3 0  Feeling bad or failure about yourself   0 1 0 0  Trouble concentrating  0 3 0 0  Moving slowly or fidgety/restless  0 1 0 0  Suicidal thoughts   0 0 0  PHQ-9 Score  4 13 15  0  Difficult doing work/chores  Not difficult at all Somewhat difficult Somewhat difficult Not difficult at all   Hypertension: BP Readings from Last 3 Encounters:  04/02/24 128/82  12/31/23 122/78  11/25/23 122/80   Obesity: Wt Readings from Last 3 Encounters:  04/02/24 181 lb 6.4 oz (82.3 kg)  12/31/23 177 lb 9.6 oz (80.6 kg)  11/25/23 180 lb 1.6 oz (81.7 kg)   BMI Readings from Last 3 Encounters:  04/02/24 35.43 kg/m  12/31/23 34.69 kg/m  11/25/23 35.17 kg/m     Vaccines: HPV vaccines reviewed with the patient. Agreeable for first HPV vaccine  STD testing and prevention (HIV/chl/gon/syphilis): no concerns  Intimate partner violence: negative screen  LMP: 03/23/2024, regular, 5 last 5 days not on hormonal birth control  Discussed importance of follow up if any post-menopausal bleeding: not applicable  Incontinence Symptoms: negative for symptoms   Breast cancer:  - Last Mammogram: NA, discussed starting screening at age 26  Osteoporosis Prevention : Discussed high calcium and vitamin D supplementation, weight bearing exercises Bone density :not applicable   Cervical cancer screening: up-to-date - HPV positive in 2023, negative cells and HPV 03/25/23  Skin cancer: Discussed monitoring for atypical lesions  Colorectal cancer: Discussed starting screening at age 34 Lung cancer:  Low Dose CT Chest recommended if Age 31-80 years, 20 pack-year currently smoking OR have  quit w/in 15years. Patient does not qualify for screen    Advanced Care Planning: A voluntary discussion about advance care planning including the explanation and discussion of advance directives.  Discussed health care proxy and Living will, and the patient was able to identify a health care proxy as Anna Tapia (aunt).  Patient does not have a living will and power of attorney of health care   Patient Active Problem List   Diagnosis Date Noted   History of CVA with residual deficit 11/25/2023   CHF (congestive heart failure) (HCC) 03/25/2023   Left-sided nontraumatic intracerebral hemorrhage (HCC) 08/01/2022   AVM (arteriovenous malformation) brain 07/24/2022   Asthma 07/24/2022   Major depressive disorder, recurrent episode, moderate (HCC) 02/07/2022   Sepsis secondary to UTI (HCC) 05/01/2020   VP (ventriculoperitoneal) shunt status 05/01/2020   Right ovarian cyst 05/01/2020   Elevated LFTs 05/01/2020    Past Surgical History:  Procedure Laterality Date   VENTRICULOPERITONEAL SHUNT      Family History  Problem Relation Age of Onset   Seizures Mother    Hypertension Mother    Diabetes Mother    Cancer Maternal Aunt    Cancer Maternal Uncle     Social History   Socioeconomic History   Marital status: Single    Spouse name: Not on file   Number of children: Not on file   Years of education: Not on file   Highest education level: GED or equivalent  Occupational History   Not on file  Tobacco Use   Smoking status: Former    Current packs/day: 0.00    Types: E-cigarettes, Cigarettes    Start date: 2007    Quit date: 2021    Years since quitting: 4.4    Passive exposure: Past   Smokeless tobacco: Never  Vaping Use   Vaping status: Former  Substance and Sexual Activity   Alcohol use: Not Currently   Drug use: Never   Sexual activity: Not Currently    Partners: Male    Birth control/protection: Abstinence  Other Topics Concern   Not on file  Social History  Narrative   Not on file   Social Drivers of Health   Financial Resource Strain: Medium Risk (04/02/2024)   Overall Financial Resource Strain (CARDIA)    Difficulty of Paying Living Expenses: Somewhat hard  Food Insecurity: No Food Insecurity (04/02/2024)   Hunger Vital Sign    Worried About Running Out of Food in the Last Year: Never true    Ran Out of Food in the Last Year: Never true  Transportation Needs: No Transportation Needs (04/02/2024)   PRAPARE - Administrator, Civil Service (Medical): No    Lack of Transportation (Non-Medical): No  Physical Activity: Insufficiently Active (04/02/2024)   Exercise Vital Sign  Days of Exercise per Week: 3 days    Minutes of Exercise per Session: 10 min  Stress: No Stress Concern Present (04/02/2024)   [employer] of Occupational Health - Occupational Stress Questionnaire    Feeling of Stress : Only a little  Social Connections: Moderately Isolated (04/02/2024)   Social Connection and Isolation Panel [NHANES]    Frequency of Communication with Friends and Family: More than three times a week    Frequency of Social Gatherings with Friends and Family: More than three times a week    Attends Religious Services: 1 to 4 times per year    Active Member of [employer] or Organizations: No    Attends Banker Meetings: Never    Marital Status: Never married  Intimate Partner Violence: Not At Risk (04/02/2024)   Humiliation, Afraid, Rape, and Kick questionnaire    Fear of Current or Ex-Partner: No    Emotionally Abused: No    Physically Abused: No    Sexually Abused: No     Current Outpatient Medications:    acetaminophen  (TYLENOL ) 325 MG tablet, Take 2 tablets (650 mg total) by mouth every 6 (six) hours as needed for moderate pain (pain score 4-6)., Disp: 90 tablet, Rfl: 3   albuterol  (VENTOLIN  HFA) 108 (90 Base) MCG/ACT inhaler, Inhale 2 puffs into the lungs every 6 (six) hours as needed for wheezing or shortness of  breath., Disp: 8 g, Rfl: 0   baclofen  (LIORESAL ) 10 MG tablet, Take 5 mg in the morning, 10 mg at lunch and 20 mg at night., Disp: 180 tablet, Rfl: 1   DULoxetine  (CYMBALTA ) 20 MG capsule, Take 2 capsules (40 mg total) by mouth daily., Disp: 120 capsule, Rfl: 0   famotidine  (PEPCID ) 20 MG tablet, Take 1 tablet (20 mg total) by mouth 2 (two) times daily., Disp: 60 tablet, Rfl: 0   fluticasone -salmeterol (ADVAIR) 100-50 MCG/ACT AEPB, Inhale 1 puff into the lungs 2 (two) times daily., Disp: 1 each, Rfl: 3   pregabalin (LYRICA) 50 MG capsule, Take 50 mg by mouth 2 (two) times daily., Disp: , Rfl:    albuterol  (VENTOLIN  HFA) 108 (90 Base) MCG/ACT inhaler, Inhale 2 puffs into the lungs every 6 (six) hours as needed for wheezing or shortness of breath. (Patient not taking: Reported on 04/02/2024), Disp: 8 g, Rfl: 2   dibucaine (NUPERCAINAL) 1 % OINT, Place 1 application  rectally 3 (three) times daily as needed for hemorrhoids. (Patient not taking: Reported on 04/02/2024), Disp: 30 g, Rfl: 0  No Known Allergies   Review of Systems  Skin:  Positive for rash.     Objective  Vitals:   04/02/24 0831  BP: 128/82  Pulse: 97  Resp: 16  Temp: 98.3 F (36.8 C)  TempSrc: Oral  SpO2: 99%  Weight: 181 lb 6.4 oz (82.3 kg)  Height: 5' (1.524 m)    Body mass index is 35.43 kg/m.  Physical Exam Exam conducted with a chaperone present.  Constitutional:      Appearance: Normal appearance.  HENT:     Head: Normocephalic and atraumatic.  Eyes:     Conjunctiva/sclera: Conjunctivae normal.  Cardiovascular:     Rate and Rhythm: Normal rate and regular rhythm.  Pulmonary:     Effort: Pulmonary effort is normal.     Breath sounds: Normal breath sounds.  Skin:    General: Skin is warm and dry.     Comments: Red abrasion on left labia  Neurological:     General:  No focal deficit present.     Mental Status: She is alert. Mental status is at baseline.  Psychiatric:        Mood and Affect: Mood  normal.        Behavior: Behavior normal.     Last CBC Lab Results  Component Value Date   WBC 6.1 11/19/2022   HGB 13.0 11/19/2022   HCT 39.7 11/19/2022   MCV 87.8 11/19/2022   MCH 28.8 11/19/2022   RDW 11.7 11/19/2022   PLT 415 (H) 11/19/2022   Last metabolic panel Lab Results  Component Value Date   GLUCOSE 76 11/19/2022   NA 140 11/19/2022   K 4.5 11/19/2022   CL 105 11/19/2022   CO2 26 11/19/2022   BUN 7 11/19/2022   CREATININE 0.71 11/19/2022   EGFR 116 11/19/2022   CALCIUM 9.3 11/19/2022   PROT 7.5 11/19/2022   ALBUMIN 4.6 07/24/2022   BILITOT 0.3 11/19/2022   ALKPHOS 53 07/24/2022   AST 17 11/19/2022   ALT 13 11/19/2022   ANIONGAP 8 07/24/2022   Last lipids Lab Results  Component Value Date   CHOL 206 (H) 11/19/2022   HDL 70 11/19/2022   LDLCALC 121 (H) 11/19/2022   TRIG 63 11/19/2022   CHOLHDL 2.9 11/19/2022   Last hemoglobin A1c No results found for: "HGBA1C" Last thyroid functions No results found for: "TSH", "T3TOTAL", "T4TOTAL", "THYROIDAB" Last vitamin D No results found for: "25OHVITD2", "25OHVITD3", "VD25OH" Last vitamin B12 and Folate No results found for: "VITAMINB12", "FOLATE"    Assessment & Plan  Assessment & Plan Skin irritation in groin area Painful, non-itchy flat rash likely due to soap irritation or dryness. No infection or serious condition. - Switch to fragrance-free, sensitive skin soap such as Dove. - Apply Vaseline to the affected area daily for two weeks.  Hx of HPV infection Positive HPV test in March 2023 with negative follow-up Pap and HPV test in May 2024. No repeat testing needed until spring 2027. Discussed HPV vaccine eligibility as age cutoff is extended to 39. - Schedule repeat Pap and HPV test in spring 2027. - Administer first dose of HPV vaccine today.  Hypertension Blood pressure 128/82 mmHg, within normal range. Recent elevated episodes possibly related to feeling unwell. - Monitor blood pressure  regularly and report any consistent elevation.  General Health Maintenance Discussed diet, exercise, and routine health screenings. Encouraged dietary improvement and regular exercise. Discussed need for eye and dental check-ups. Blood work to monitor health parameters. - Encourage a balanced diet and continue regular exercise, increasing duration gradually. - Schedule an eye exam with an optometrist to update glasses prescription. - Schedule a dental check-up for routine cleaning and examination. - Administer blood work to check kidneys, liver, electrolytes, anemia, cholesterol, testosterone, FSH, and LH.  - Comprehensive Metabolic Panel (CMET) - CBC w/Diff/Platelet - HPV 9-valent vaccine,Recombinat - Lipid Profile - FSH/LH - Testosterone,Free and Total   -USPSTF grade A and B recommendations reviewed with patient; age-appropriate recommendations, preventive care, screening tests, etc discussed and encouraged; healthy living encouraged; see AVS for patient education given to patient -Discussed importance of 150 minutes of physical activity weekly, eat two servings of fish weekly, eat one serving of tree nuts ( cashews, pistachios, pecans, almonds.Aaron Aas) every other day, eat 6 servings of fruit/vegetables daily and drink plenty of water and avoid sweet beverages.   -Reviewed Health Maintenance: Yes.

## 2024-04-02 NOTE — Addendum Note (Signed)
 Addended by: Rubert Frediani, Dori Garbe on: 04/02/2024 09:53 AM   Modules accepted: Orders

## 2024-04-03 LAB — COMPREHENSIVE METABOLIC PANEL WITH GFR
AG Ratio: 1.3 (calc) (ref 1.0–2.5)
ALT: 19 U/L (ref 6–29)
AST: 21 U/L (ref 10–30)
Albumin: 4.3 g/dL (ref 3.6–5.1)
Alkaline phosphatase (APISO): 68 U/L (ref 31–125)
BUN: 9 mg/dL (ref 7–25)
CO2: 27 mmol/L (ref 20–32)
Calcium: 9.6 mg/dL (ref 8.6–10.2)
Chloride: 103 mmol/L (ref 98–110)
Creat: 0.61 mg/dL (ref 0.50–0.97)
Globulin: 3.2 g/dL (ref 1.9–3.7)
Glucose, Bld: 83 mg/dL (ref 65–99)
Potassium: 4.3 mmol/L (ref 3.5–5.3)
Sodium: 140 mmol/L (ref 135–146)
Total Bilirubin: 0.3 mg/dL (ref 0.2–1.2)
Total Protein: 7.5 g/dL (ref 6.1–8.1)
eGFR: 121 mL/min/{1.73_m2} (ref >=60)

## 2024-04-03 LAB — CBC WITH DIFFERENTIAL/PLATELET
Absolute Lymphocytes: 2284 {cells}/uL (ref 850–3900)
Absolute Monocytes: 770 {cells}/uL (ref 200–950)
Basophils Absolute: 73 {cells}/uL (ref 0–200)
Basophils Relative: 0.9 %
Eosinophils Absolute: 154 {cells}/uL (ref 15–500)
Eosinophils Relative: 1.9 %
HCT: 39.4 % (ref 35.0–45.0)
Hemoglobin: 12.4 g/dL (ref 11.7–15.5)
MCH: 27.7 pg (ref 27.0–33.0)
MCHC: 31.5 g/dL — ABNORMAL LOW (ref 32.0–36.0)
MCV: 88.1 fL (ref 80.0–100.0)
MPV: 9.7 fL (ref 7.5–12.5)
Monocytes Relative: 9.5 %
Neutro Abs: 4820 {cells}/uL (ref 1500–7800)
Neutrophils Relative %: 59.5 %
Platelets: 349 10*3/uL (ref 140–400)
RBC: 4.47 10*6/uL (ref 3.80–5.10)
RDW: 12.4 % (ref 11.0–15.0)
Total Lymphocyte: 28.2 %
WBC: 8.1 10*3/uL (ref 3.8–10.8)

## 2024-04-03 LAB — LIPID PANEL
Cholesterol: 202 mg/dL — ABNORMAL HIGH (ref <200)
HDL: 68 mg/dL (ref >=50)
LDL Cholesterol (Calc): 119 mg/dL — ABNORMAL HIGH
Non-HDL Cholesterol (Calc): 134 mg/dL — ABNORMAL HIGH (ref <130)
Total CHOL/HDL Ratio: 3 (calc) (ref <5.0)
Triglycerides: 56 mg/dL (ref <150)

## 2024-04-03 LAB — FSH/LH
FSH: 3.8 m[IU]/mL
LH: 5.9 m[IU]/mL

## 2024-04-06 LAB — TESTOSTERONE, FREE & TOTAL
Free Testosterone: 3.1 pg/mL (ref 0.1–6.4)
Testosterone, Total, LC-MS-MS: 35 ng/dL (ref 2–45)

## 2024-04-08 ENCOUNTER — Ambulatory Visit: Payer: Medicaid Other | Attending: Internal Medicine

## 2024-04-08 ENCOUNTER — Ambulatory Visit: Payer: Medicaid Other | Admitting: Physical Therapy

## 2024-04-08 ENCOUNTER — Ambulatory Visit: Payer: Self-pay | Admitting: Internal Medicine

## 2024-04-08 DIAGNOSIS — R278 Other lack of coordination: Secondary | ICD-10-CM | POA: Diagnosis not present

## 2024-04-08 DIAGNOSIS — I6930 Unspecified sequelae of cerebral infarction: Secondary | ICD-10-CM | POA: Diagnosis not present

## 2024-04-08 DIAGNOSIS — M6281 Muscle weakness (generalized): Secondary | ICD-10-CM | POA: Diagnosis not present

## 2024-04-08 DIAGNOSIS — Z7689 Persons encountering health services in other specified circumstances: Secondary | ICD-10-CM | POA: Diagnosis not present

## 2024-04-08 NOTE — Therapy (Signed)
 OUTPATIENT OCCUPATIONAL THERAPY NEURO TREATMENT NOTE  Patient Name: Anna Tapia MRN: [id number] DOB:Jan 23, 1991, 33 y.o., female Today's Date: 04/10/2024  PCP: Dr. Rockney Cid  REFERRING PROVIDER: Dr. Rockney Cid  END OF SESSION:  OT End of Session - 04/10/24 1029     Visit Number 9    Number of Visits 24    Date for OT Re-Evaluation 04/22/24    Authorization Type [employer]; [employer] required    Authorization Time Period 12 OT visits approved between 02/05/24-05/02/24    Authorization - Visit Number 8    Authorization - Number of Visits 12    Progress Note Due on Visit 10    OT Start Time 1530    OT Stop Time 1615    OT Time Calculation (min) 45 min    Activity Tolerance Patient tolerated treatment well    Behavior During Therapy WFL for tasks assessed/performed             Past Medical History:  Diagnosis Date   Arteriovenous malformation of brain    a. s/p remote coiling.   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   Congenital CHF (congestive heart failure) (HCC)    a. 03/2023 Echo: EF 60-65%, no rwma, nl RV fxn, RVSP 29.2mmHg. Mild MR. Mild Ao sclerosis w/o stenosis. Ao root 38mm.   COVID 2022   Hemorrhagic stroke (HCC) 2023   a. L-sided midbrain, thalamic ICH in setting of PCA/ACA aneurysm s/p embolization.   History of broken collarbone 2020   Hydrocephalus (HCC)    a. s/p VP shunt in childhood.   Precordial chest pain    a. 03/2023 MV: No ischemia or infarct.  EF greater than 65%.  No significant coronary calcification.   Sepsis secondary to UTI (HCC) 2021   Past Surgical History:  Procedure Laterality Date   VENTRICULOPERITONEAL SHUNT     Patient Active Problem List   Diagnosis Date Noted   History of CVA with residual deficit 11/25/2023   CHF (congestive heart failure) (HCC) 03/25/2023   Left-sided nontraumatic intracerebral hemorrhage (HCC) 08/01/2022   AVM (arteriovenous malformation) brain  07/24/2022   Asthma 07/24/2022   Major depressive disorder, recurrent episode, moderate (HCC) 02/07/2022   Sepsis secondary to UTI (HCC) 05/01/2020   VP (ventriculoperitoneal) shunt status 05/01/2020   Right ovarian cyst 05/01/2020   Elevated LFTs 05/01/2020   ONSET DATE: Sept 2023  REFERRING DIAG: I69.30 (ICD-10-CM) - History of CVA with residual deficit   THERAPY DIAG:  Muscle weakness (generalized)  Other lack of coordination  History of CVA with residual deficit  Rationale for Evaluation and Treatment: Rehabilitation  SUBJECTIVE:  SUBJECTIVE STATEMENT: Pt reports that she was able to cut her own Ktape strips and apply to R hand digits today. Pt accompanied by: self, aunt  PERTINENT HISTORY: Pt known to this clinic after participation in therapy in 2024 following her L CVA in 2023.  Pt was discharged from OT in Oct of 2024 d/t visit limitations with therapy.  Pt returns today to address RUE residual deficits from CVA after most recent Botox injections on 01/21/24.  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 Yes: NPRS scale: 4-5/10 R shoulder from recent Botox  Pain location: R shoulder  Pain description: achy Aggravating factors: Botox shot Relieving factors: heat, rest  FALLS: Has patient fallen in last 6 months? No  LIVING ENVIRONMENT: Lives with: lives with their family mother and 3 brothers, but mostly staying with her aunt  Lives in: ground level apartment with aunt  Stairs: Yes: External: 3 steps; bilateral but cannot reach both Has following equipment at home: Single point cane  PLOF: Independent prior to CVA  PATIENT GOALS: "Using my arm more."    OBJECTIVE:  Note: Objective measures were completed at Evaluation unless otherwise noted.  HAND DOMINANCE: Left  ADLs: Overall ADLs: Pt reports that she tries to engage the R hand into everything she does Transfers/ambulation related to ADLs: indep Eating: cuts food with L hand Grooming: increased time to engage the R  UB Dressing: Increased time with clothing fasteners LB Dressing: wears slip on shoes; wore jeans today and did ok with the clothing fasteners, extra time  Toileting: indep Bathing: occasional pain when reaching over with the R arm to bathe the L arm (R pec and shoulder pain) Tub Shower transfers: modified indep Equipment: Shower seat with back, Grab bars, and hand held shower hose   IADLs: Shopping: pt can go shopping accompanied by a family member  Light housekeeping: pt reports unable to sweep; pt reports she gets dizzy  Meal Prep: Pt reports she now cooks about 3 nights a week, but has difficulty opening containers/jars/food packages in the [employer] mobility: uses cane Medication management: modified indep; increased time to engage the R hand Financial management: mother manages (pt states she doesn't really have any bills)  Handwriting: NT (pt is L hand dominant)  MOBILITY STATUS: Uses cane for longer distances, but did not bring cane today  POSTURE COMMENTS:  rounded shoulders Sitting balance: Moves/returns truncal midpoint >2 inches in all planes  ACTIVITY TOLERANCE: Activity tolerance: Pt fatigues with IADLs and community mobility  FUNCTIONAL OUTCOME MEASURES: Upper Extremity Functional Scale (UEFS): TBD  UPPER EXTREMITY ROM:    Active ROM Left  Eval WNL throughout Right OT d/c on 08/12/23 (Previous episode of care) Right Eval on 01/29/24    Shoulder flexion  133 (135) 125 (134)   Shoulder abduction  120 (125) 90 (104)   Shoulder adduction      Shoulder extension      Shoulder internal rotation  R thumb to lumbar spine (better  clearance of SI joint) To R side of lower back-good clearance of SI joint   Shoulder external rotation  Hand to back of head without chin tuck and good abd  63* with arm abd (Slight scaption)   Elbow flexion      Elbow extension      Wrist flexion      Wrist extension      Wrist ulnar deviation      Wrist radial deviation      Wrist pronation      Wrist supination      (Blank rows = not tested)  UPPER EXTREMITY MMT:     MMT Left Eval 5/5 throughout Right OT d/c on 08/12/23 (Previous episode of care) Right Eval 01/29/24   Shoulder flexion  4- 4   Shoulder abduction  4- 4   Shoulder adduction      Shoulder extension      Shoulder internal rotation  4 4   Shoulder external rotation  4- 4  Middle trapezius      Lower trapezius      Elbow flexion  4+ 4+   Elbow extension  5 5   Wrist flexion  4+ 4+   Wrist extension  4+ 4+   Wrist ulnar deviation      Wrist radial deviation      Wrist pronation      Wrist supination      (Blank rows = not tested)  HAND FUNCTION: 08/12/23: Grip strength: Right: 14 lbs, Left 60 lbs; lateral pinch: Right: 6 lbs, Left: 16 lbs , 3 point pinch: Right: 5 (IP flexion; Saehan pinch gauge), Left: 19 lbs  01/29/24 Eval: Grip strength: Right: 18 lbs; Left: 55 lbs, Lateral pinch: Right: 5 lbs, Left: 14 lbs, and 3 point pinch: Right:  Fingers slip- standard pinch gauge used 2 lbs, Left: 15 lbs  COORDINATION: 08/12/23: Right: 5 min and 19 sec; 2nd trial 3 min 49 sec, Left 23 sec,  01/29/24 Eval: 9 Hole Peg test: Right: 2 min 41 sec  sec; Left: 23 sec  SENSATION: Light touch: Impaired , tingling in the R hand   EDEMA: No visible edema  MUSCLE TONE: RUE: Mild and Moderate (Last round of Botox on 01/21/24)  COGNITION: Overall cognitive status: Within functional limits for tasks assessed  VISION: WFL; no recent changes  PERCEPTION: WFL  PRAXIS: Impaired: Motor planning and Clonus R hand  OBSERVATIONS:  Pt remains very motivated to increase  functional use of RUE for daily tasks.                                                                                                              TREATMENT DATE: 04/08/24 Therapeutic Activity: -Facilitated R hand FMC/dexterity skills practicing sustained 2 point pinch, 3 point pinch, and lumbrical grasp for manipulation of small glass stones and circular wooden blocks -Practiced item storage and translatory skills working with small glass stones in R hand.  Therapeutic Exercise: -Passive R hand digit MP/PIP extension for reducing hand stiffness -Passive R thumb MP flexion and palmer abd -Passive R shoulder retraction/protraction/depression, R shoulder elevation, abd, ER/IR, working to decrease pain and increase R shoulder mobility for daily tasks.  -Assisted table slide for R shoulder flexion x10 reps  PATIENT EDUCATION: Education details: HEP review  Person educated: Patient Education method: Explanation Education comprehension: verbalized understanding  HOME EXERCISE PROGRAM: Pink theraputty, self passive stretching for R hand digit ext/thumb flex/abd  GOALS: Goals reviewed with patient? Yes  SHORT TERM GOALS: Target date: 03/11/24  Pt will be indep to perform HEP for improving R hand strength and coordination for daily tasks. Baseline: Eval: Not yet initiated Goal status: INITIAL   LONG TERM GOALS: Target date: 04/22/24  Pt will increase UEFS by 10 or more points to indicate self perceived functional improvement when engaging the RUE into daily tasks. Baseline: Eval: TBD Goal status: INITIAL  2.  Pt will increase R grip strength by 5 or more lbs to securely stabilize containers and jars for easier opening.  Baseline: Eval:  R grip strength 18 lbs (L 55 lbs) Goal status: INITIAL  3.  Pt will increase R hand dexterity/FMC skills to manipulate clothing fasteners with bilat hands.  Baseline:  Goal status: INITIAL  4.  Pt will increase R active shoulder flexion to 140* or  better to achieve functional ROM for UB ADLs and reaching for ADL supplies.  Baseline: Eval: R shoulder flexion 125*; difficulty reaching overhead for ADL supplies Goal status: INITIAL  ASSESSMENT:  CLINICAL IMPRESSION: Good tolerance to R scapular and shoulder mobility as noted above, working to reduce pain/stiffness which pt reports she mostly has upon waking, despite trying not to sleep on the R side.  Pt pleased to report that she was able to cut her own Ktape strips with the R hand and apply to the R dorsal hand and digits this date; application looked effective and pt continues to demonstrate improved digit extension for grasp/release activities.  Pt continues to have intermittent slips of her fingers when working to sustain a 2 and 3 point pinch pattern, but overall this is improving as noted by fewer dropped items from hand during activities noted above.  Pt will continue to benefit from skilled OT to address RUE deficits in order to improve indep and efficiency when engaging the RUE into daily tasks.    PERFORMANCE DEFICITS: in functional skills including ADLs, IADLs, coordination, dexterity, sensation, tone, ROM, strength, pain, flexibility, Fine motor control, Gross motor control, mobility, balance, body mechanics, endurance, decreased knowledge of use of DME, and UE functional use, and psychosocial skills including coping strategies, environmental adaptation, habits, and routines and behaviors.   IMPAIRMENTS: are limiting patient from ADLs, IADLs, work, leisure, and social participation.   CO-MORBIDITIES: has co-morbidities such as CHF, asthma, depression that affects occupational performance. Patient will benefit from skilled OT to address above impairments and improve overall function.  MODIFICATION OR ASSISTANCE TO COMPLETE EVALUATION: No modification of tasks or assist necessary to complete an evaluation.  OT OCCUPATIONAL PROFILE AND HISTORY: Detailed assessment: Review of records  and additional review of physical, cognitive, psychosocial history related to current functional performance.  CLINICAL DECISION MAKING: Moderate - several treatment options, min-mod task modification necessary  REHAB POTENTIAL: Good  EVALUATION COMPLEXITY: Moderate    PLAN:  OT FREQUENCY: 1-2x/week  OT DURATION: 12 weeks  PLANNED INTERVENTIONS: 97168 OT Re-evaluation, 97535 self care/ADL training, 56213 therapeutic exercise, 97530 therapeutic activity, 97112 neuromuscular re-education, 97140 manual therapy, 97010 moist heat, 97010 cryotherapy, passive range of motion, balance training, functional mobility training, psychosocial skills training, energy conservation, coping strategies training, patient/family education, and DME and/or AE instructions  RECOMMENDED OTHER SERVICES: Pt completed PT evaluation this date  CONSULTED AND AGREED WITH PLAN OF CARE: Patient  PLAN FOR NEXT SESSION: see above  Marcus Sewer, MS, OTR/L  Casandra Claw, OT 04/10/2024, 10:31 AM

## 2024-04-15 ENCOUNTER — Ambulatory Visit: Payer: Medicaid Other

## 2024-04-15 ENCOUNTER — Ambulatory Visit: Payer: Medicaid Other | Admitting: Physical Therapy

## 2024-04-15 DIAGNOSIS — I6930 Unspecified sequelae of cerebral infarction: Secondary | ICD-10-CM | POA: Diagnosis not present

## 2024-04-15 DIAGNOSIS — M6281 Muscle weakness (generalized): Secondary | ICD-10-CM

## 2024-04-15 DIAGNOSIS — R278 Other lack of coordination: Secondary | ICD-10-CM | POA: Diagnosis not present

## 2024-04-15 DIAGNOSIS — Z419 Encounter for procedure for purposes other than remedying health state, unspecified: Secondary | ICD-10-CM | POA: Diagnosis not present

## 2024-04-15 NOTE — Therapy (Signed)
 OUTPATIENT OCCUPATIONAL THERAPY NEURO PROGRESS AND TREATMENT NOTE Reporting period beginning 01/29/24-04/15/24  Patient Name: Anna Tapia MRN: [id number] DOB:August 20, 1991, 33 y.o., female Today's Date: 04/16/2024  PCP: Dr. Rockney Cid  REFERRING PROVIDER: Dr. Rockney Cid  END OF SESSION:  OT End of Session - 04/15/24 1450     Visit Number 10    Number of Visits 12    Date for OT Re-Evaluation 05/28/24    Authorization Type [employer]; [employer] required    Authorization Time Period 12 OT visits approved between 02/05/24-05/02/24 (eval not counted in visit count)    Authorization - Visit Number 9    Authorization - Number of Visits 12    Progress Note Due on Visit 10    OT Start Time 1445    OT Stop Time 1530    OT Time Calculation (min) 45 min    Activity Tolerance Patient tolerated treatment well    Behavior During Therapy [facility] for tasks assessed/performed          Past Medical History:  Diagnosis Date   Arteriovenous malformation of brain    a. s/p remote coiling.   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   Congenital CHF (congestive heart failure) (HCC)    a. 03/2023 Echo: EF 60-65%, no rwma, nl RV fxn, RVSP 29.57mmHg. Mild MR. Mild Ao sclerosis w/o stenosis. Ao root 38mm.   COVID 2022   Hemorrhagic stroke (HCC) 2023   a. L-sided midbrain, thalamic ICH in setting of PCA/ACA aneurysm s/p embolization.   History of broken collarbone 2020   Hydrocephalus (HCC)    a. s/p VP shunt in childhood.   Precordial chest pain    a. 03/2023 MV: No ischemia or infarct.  EF greater than 65%.  No significant coronary calcification.   Sepsis secondary to UTI (HCC) 2021   Past Surgical History:  Procedure Laterality Date   VENTRICULOPERITONEAL SHUNT     Patient Active Problem List   Diagnosis Date Noted   History of CVA with residual deficit 11/25/2023   CHF (congestive heart failure) (HCC) 03/25/2023   Left-sided nontraumatic  intracerebral hemorrhage (HCC) 08/01/2022   AVM (arteriovenous malformation) brain 07/24/2022   Asthma 07/24/2022   Major depressive disorder, recurrent episode, moderate (HCC) 02/07/2022   Sepsis secondary to UTI (HCC) 05/01/2020   VP (ventriculoperitoneal) shunt status 05/01/2020   Right ovarian cyst 05/01/2020   Elevated LFTs 05/01/2020   ONSET DATE: Sept 2023  REFERRING DIAG: I69.30 (ICD-10-CM) - History of CVA with residual deficit   THERAPY DIAG:  Muscle weakness (generalized)  Other lack of coordination  History of CVA with residual deficit  Rationale for Evaluation and Treatment: Rehabilitation  SUBJECTIVE:  SUBJECTIVE STATEMENT: Pt reports that she has done some cooking this week. Pt accompanied by: self, aunt  PERTINENT HISTORY: Pt known to this clinic after participation in therapy in 2024 following her L CVA in 2023.  Pt was discharged from OT in Oct of 2024 d/t visit limitations with therapy.  Pt returns today to address RUE residual deficits from CVA after most recent Botox injections on 01/21/24.  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 Yes: NPRS scale: 4-5/10 R shoulder from recent Botox  Pain location: R shoulder  Pain description: achy Aggravating factors: Botox shot Relieving factors: heat, rest  FALLS: Has patient fallen in last 6 months? No  LIVING ENVIRONMENT: Lives with: lives with their family mother and 3 brothers, but mostly staying with her aunt  Lives in: ground level apartment with aunt  Stairs: Yes: External: 3 steps; bilateral but cannot reach both Has following equipment at home: Single point cane  PLOF:  Independent prior to CVA  PATIENT GOALS: Using my arm more.   OBJECTIVE:  Note: Objective measures were completed at Evaluation unless otherwise noted.  HAND DOMINANCE: Left  ADLs: Overall ADLs: Pt reports that she tries to engage the R hand into everything she does Transfers/ambulation related to ADLs: indep Eating: cuts food with L hand Grooming: increased time to engage the R  UB Dressing: Increased time with clothing fasteners LB Dressing: wears slip on shoes; wore jeans today and did ok with the clothing fasteners, extra time  Toileting: indep Bathing: occasional pain when reaching over with the R arm to bathe the L arm (R pec and shoulder pain) Tub Shower transfers: modified indep Equipment: Shower seat with back, Grab bars, and hand held shower hose   IADLs: Shopping: pt can go shopping accompanied by a family member  Light housekeeping: pt reports unable to sweep; pt reports she gets dizzy  Meal Prep: Pt reports she now cooks about 3 nights a week, but has difficulty opening containers/jars/food packages in the [employer] mobility: uses cane Medication management: modified indep; increased time to engage the R hand Financial management: mother manages (pt states she doesn't really have any bills)  Handwriting: NT (pt is L hand dominant)  MOBILITY STATUS: Uses cane for longer distances, but did not bring cane today  POSTURE COMMENTS:  rounded shoulders Sitting balance: Moves/returns truncal midpoint >2 inches in all planes  ACTIVITY TOLERANCE: Activity tolerance: Pt fatigues with IADLs and community mobility  FUNCTIONAL OUTCOME MEASURES: Upper Extremity Functional Scale (UEFS): TBD  UPPER EXTREMITY ROM:    Active ROM Left  Eval WNL throughout Right OT d/c on 08/12/23 (Previous episode of care) Right Eval on 01/29/24  Right 04/15/24  Shoulder flexion  133 (135) 125 (134) 125 (134)  Shoulder abduction  120 (125) 90 (104) 122 (125)  Shoulder adduction       Shoulder extension      Shoulder internal rotation  R thumb to lumbar spine (better clearance of SI joint) To R side of lower back-good clearance of SI joint To R side of mid back-good clearance of SI joint   Shoulder external rotation  Hand to back of head without chin tuck and good abd  63* with arm abd (Slight scaption) 78*  Elbow flexion      Elbow extension      Wrist flexion      Wrist extension      Wrist ulnar deviation      Wrist radial deviation      Wrist pronation      Wrist supination      (Blank rows = not tested)  UPPER EXTREMITY MMT:     MMT Left Eval 5/5 throughout Right OT d/c on 08/12/23 (Previous episode of care) Right Eval 01/29/24 Right 04/15/24  Shoulder flexion  4- 4 4+  Shoulder abduction  4- 4 4  Shoulder adduction      Shoulder extension  Shoulder internal rotation  4 4 4+  Shoulder external rotation  4- 4 4  Middle trapezius      Lower trapezius      Elbow flexion  4+ 4+ 4+  Elbow extension  5 5 5   Wrist flexion  4+ 4+ 4+  Wrist extension  4+ 4+ 4+  Wrist ulnar deviation      Wrist radial deviation      Wrist pronation      Wrist supination      (Blank rows = not tested)  HAND FUNCTION: 08/12/23: Grip strength: Right: 14 lbs, Left 60 lbs; lateral pinch: Right: 6 lbs, Left: 16 lbs , 3 point pinch: Right: 5 (IP flexion; Saehan pinch gauge), Left: 19 lbs  01/29/24 Eval: Grip strength: Right: 18 lbs; Left: 55 lbs, Lateral pinch: Right: 5 lbs, Left: 14 lbs, and 3 point pinch: Right:  Fingers slip- standard pinch gauge used 2 lbs, Left: 15 lbs 04/15/24: Grip strength: Right 28 lbs; Lateral pinch: Right: 8 lbs, 3 point pinch: 3 lbs (fingers slipped)  COORDINATION: 08/12/23: Right: 5 min and 19 sec; 2nd trial 3 min 49 sec, Left 23 sec,  01/29/24 Eval: 9 Hole Peg test: Right: 2 min 41 sec  sec; Left: 23 sec 04/15/24: Attempted/increased challenge and frustration with this today; will attempt next visit  SENSATION: Light touch: Impaired , tingling  in the R hand   EDEMA: No visible edema  MUSCLE TONE: RUE: Mild and Moderate (Last round of Botox on 01/21/24)  COGNITION: Overall cognitive status: Within functional limits for tasks assessed  VISION: WFL; no recent changes  PERCEPTION: WFL  PRAXIS: Impaired: Motor planning and Clonus R hand  OBSERVATIONS:  Pt remains very motivated to increase functional use of RUE for daily tasks.                                                                                                              TREATMENT DATE: 04/15/24 Therapeutic Activity: -Objective measures taken and goals updated and reviewed for progress note/recertification.  Neuro re-ed: -With Ktape applied to R hand digits 1-3 on volar aspect to promote digit extension, pt participated in R hand FMC/dexterity skills working to flip and stack circular blocks using a 3 point pinch prehension pattern.    PATIENT EDUCATION: Education details: Progress towards goals  Person educated: Patient Education method: Explanation Education comprehension: verbalized understanding  HOME EXERCISE PROGRAM: Pink theraputty, self passive stretching for R hand digit ext/thumb flex/abd  GOALS: Goals reviewed with patient? Yes  SHORT TERM GOALS: Target date: 03/11/24  Pt will be indep to perform HEP for improving R hand strength and coordination for daily tasks. Baseline: Eval: Not yet initiated; 04/15/24: indep with theraputty and RUE stretching Goal status: achieved   LONG TERM GOALS: Target date: 05/28/24  Pt will increase UEFS by 10 or more points to indicate self perceived functional improvement when engaging the RUE into daily tasks. Baseline: Eval: TBD; 04/15/24: D/c/ NT Goal status: d/c  2.  Pt will increase R grip strength  by 5 or more lbs to securely stabilize containers and jars for easier opening.  Baseline: Eval: R grip strength 18 lbs (L 55 lbs); 04/15/24: R grip strength 28 lbs Goal status: exceeded/achieved  3.  Pt will  increase R hand dexterity/FMC skills to manipulate clothing fasteners with bilat hands.  Baseline: Eval: Unable to hook bra and manages buttons with L hand only; 04/15/24: now able to hook bra bimanually, able to wear button up shirt today with extra time to fasten; multiple attempts at 9 hole peg test today before test terminated d/t frustration  Goal status: in progress  4.  Pt will increase R active shoulder flexion to 140* or better to achieve functional ROM for UB ADLs and reaching for ADL supplies.  Baseline: Eval: R shoulder flexion 125*; difficulty reaching overhead for ADL supplies; 04/15/24: 125 (134); focus has been on R hand dexterity skills in most sessions per pt request; but pt reports consistent stretching at home Goal status: in progress  ASSESSMENT:  CLINICAL IMPRESSION: Pt seen for 10th visit progress update and recertification this visit.  Pt has made good gains with R hand strength, noting improved ability to open containers and use a knife for chopping during meal prep.  Pt attempted 9 hole peg test several times today, but test was ultimately terminated by OT before 9 pegs were placed d/t pt with increased frustration.  R hand appeared to have increased clonus today, certainly preventing success with peg test.  Despite this, pt has improved with bimanual FMC tasks such as engaging the R hand into buttoning her shirts.  Pt is now able to sustain a light 3 point pinch pattern in the R hand with ~50-75% consistency, but fingers still tend to slip when pt uses more force when pinching.  Pt has found that Ktape has improved her ability to extend her R hand fingers to counteract her mild flexor tone in the hand.  Pt has also received her custom fit SoftPro resting hand splint which she has started to wear at night time this week.  Pt will continue to benefit from skilled OT to address RUE deficits in order to improve indep and efficiency when engaging the RUE into daily tasks.     PERFORMANCE DEFICITS: in functional skills including ADLs, IADLs, coordination, dexterity, sensation, tone, ROM, strength, pain, flexibility, Fine motor control, Gross motor control, mobility, balance, body mechanics, endurance, decreased knowledge of use of DME, and UE functional use, and psychosocial skills including coping strategies, environmental adaptation, habits, and routines and behaviors.   IMPAIRMENTS: are limiting patient from ADLs, IADLs, work, leisure, and social participation.   CO-MORBIDITIES: has co-morbidities such as CHF, asthma, depression that affects occupational performance. Patient will benefit from skilled OT to address above impairments and improve overall function.  MODIFICATION OR ASSISTANCE TO COMPLETE EVALUATION: No modification of tasks or assist necessary to complete an evaluation.  OT OCCUPATIONAL PROFILE AND HISTORY: Detailed assessment: Review of records and additional review of physical, cognitive, psychosocial history related to current functional performance.  CLINICAL DECISION MAKING: Moderate - several treatment options, min-mod task modification necessary  REHAB POTENTIAL: Good  EVALUATION COMPLEXITY: Moderate    PLAN:  OT FREQUENCY: 1-2x/week  OT DURATION: 12 weeks  PLANNED INTERVENTIONS: 97168 OT Re-evaluation, 97535 self care/ADL training, 82956 therapeutic exercise, 97530 therapeutic activity, 97112 neuromuscular re-education, 97140 manual therapy, 97010 moist heat, 97010 cryotherapy, passive range of motion, balance training, functional mobility training, psychosocial skills training, energy conservation, coping strategies training, patient/family education, and  DME and/or AE instructions  RECOMMENDED OTHER SERVICES: Pt completed PT evaluation this date  CONSULTED AND AGREED WITH PLAN OF CARE: Patient  PLAN FOR NEXT SESSION: see above  Marcus Sewer, MS, OTR/L  Casandra Claw, OT 04/16/2024, 9:20 PM

## 2024-04-21 DIAGNOSIS — I639 Cerebral infarction, unspecified: Secondary | ICD-10-CM | POA: Diagnosis not present

## 2024-04-22 ENCOUNTER — Ambulatory Visit: Payer: Medicaid Other | Admitting: Physical Therapy

## 2024-04-22 ENCOUNTER — Ambulatory Visit: Payer: Medicaid Other

## 2024-04-22 DIAGNOSIS — I6930 Unspecified sequelae of cerebral infarction: Secondary | ICD-10-CM

## 2024-04-22 DIAGNOSIS — M6281 Muscle weakness (generalized): Secondary | ICD-10-CM

## 2024-04-22 DIAGNOSIS — R278 Other lack of coordination: Secondary | ICD-10-CM | POA: Diagnosis not present

## 2024-04-22 DIAGNOSIS — Z7689 Persons encountering health services in other specified circumstances: Secondary | ICD-10-CM | POA: Diagnosis not present

## 2024-04-22 NOTE — Therapy (Signed)
 OUTPATIENT OCCUPATIONAL THERAPY NEURO TREATMENT NOTE   Patient Name: Anna Tapia MRN: [id number] DOB:[birth year], 33 y.o., female Today's Date: 04/22/2024  PCP: Dr. Rockney Cid  REFERRING PROVIDER: Dr. Rockney Cid  END OF SESSION:  OT End of Session - 04/22/24 1535     Visit Number 11    Number of Visits 12    Date for OT Re-Evaluation 05/28/24    Authorization Type [employer]; [employer] required    Authorization Time Period 12 OT visits approved between 02/05/24-05/02/24 (eval not counted in visit count)    Authorization - Visit Number 10    Authorization - Number of Visits 12    Progress Note Due on Visit 10    OT Start Time 1445    OT Stop Time 1530    OT Time Calculation (min) 45 min    Activity Tolerance Patient tolerated treatment well    Behavior During Therapy WFL for tasks assessed/performed         Past Medical History:  Diagnosis Date   Arteriovenous malformation of brain    a. s/p remote coiling.   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   Congenital CHF (congestive heart failure) (HCC)    a. 03/2023 Echo: EF 60-65%, no rwma, nl RV fxn, RVSP 29.24mmHg. Mild MR. Mild Ao sclerosis w/o stenosis. Ao root 38mm.   COVID 2022   Hemorrhagic stroke (HCC) 2023   a. L-sided midbrain, thalamic ICH in setting of PCA/ACA aneurysm s/p embolization.   History of broken collarbone 2020   Hydrocephalus (HCC)    a. s/p VP shunt in childhood.   Precordial chest pain    a. 03/2023 MV: No ischemia or infarct.  EF greater than 65%.  No significant coronary calcification.   Sepsis secondary to UTI (HCC) 2021   Past Surgical History:  Procedure Laterality Date   VENTRICULOPERITONEAL SHUNT     Patient Active Problem List   Diagnosis Date Noted   History of CVA with residual deficit 11/25/2023   CHF (congestive heart failure) (HCC) 03/25/2023   Left-sided nontraumatic intracerebral hemorrhage (HCC) 08/01/2022   AVM  (arteriovenous malformation) brain 07/24/2022   Asthma 07/24/2022   Major depressive disorder, recurrent episode, moderate (HCC) 02/07/2022   Sepsis secondary to UTI (HCC) 05/01/2020   VP (ventriculoperitoneal) shunt status 05/01/2020   Right ovarian cyst 05/01/2020   Elevated LFTs 05/01/2020   ONSET DATE: Sept 2023  REFERRING DIAG: I69.30 (ICD-10-CM) - History of CVA with residual deficit   THERAPY DIAG:  Muscle weakness (generalized)  Other lack of coordination  History of CVA with residual deficit  Rationale for Evaluation and Treatment: Rehabilitation  SUBJECTIVE:  SUBJECTIVE STATEMENT: Pt reports increased pain in neck today. Pt accompanied by: self, aunt  PERTINENT HISTORY: Pt known to this clinic after participation in therapy in 2024 following her L CVA in 2023.  Pt was discharged from OT in Oct of 2024 d/t visit limitations with therapy.  Pt returns today to address RUE residual deficits from CVA after most recent Botox injections on 01/21/24.  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 Yes: NPRS scale: 4-5/10 R shoulder from recent Botox  Pain location: R  shoulder  Pain description: achy Aggravating factors: Botox shot Relieving factors: heat, rest  FALLS: Has patient fallen in last 6 months? No  LIVING ENVIRONMENT: Lives with: lives with their family mother and 3 brothers, but mostly staying with her aunt  Lives in: ground level apartment with aunt  Stairs: Yes: External: 3 steps; bilateral but cannot reach both Has following equipment at home: Single point cane  PLOF: Independent prior to CVA  PATIENT GOALS: Using my arm more.   OBJECTIVE:  Note: Objective measures were completed  at Evaluation unless otherwise noted.  HAND DOMINANCE: Left  ADLs: Overall ADLs: Pt reports that she tries to engage the R hand into everything she does Transfers/ambulation related to ADLs: indep Eating: cuts food with L hand Grooming: increased time to engage the R  UB Dressing: Increased time with clothing fasteners LB Dressing: wears slip on shoes; wore jeans today and did ok with the clothing fasteners, extra time  Toileting: indep Bathing: occasional pain when reaching over with the R arm to bathe the L arm (R pec and shoulder pain) Tub Shower transfers: modified indep Equipment: Shower seat with back, Grab bars, and hand held shower hose   IADLs: Shopping: pt can go shopping accompanied by a family member  Light housekeeping: pt reports unable to sweep; pt reports she gets dizzy  Meal Prep: Pt reports she now cooks about 3 nights a week, but has difficulty opening containers/jars/food packages in the [employer] mobility: uses cane Medication management: modified indep; increased time to engage the R hand Financial management: mother manages (pt states she doesn't really have any bills)  Handwriting: NT (pt is L hand dominant)  MOBILITY STATUS: Uses cane for longer distances, but did not bring cane today  POSTURE COMMENTS:  rounded shoulders Sitting balance: Moves/returns truncal midpoint >2 inches in all planes  ACTIVITY TOLERANCE: Activity tolerance: Pt fatigues with IADLs and community mobility  FUNCTIONAL OUTCOME MEASURES: Upper Extremity Functional Scale (UEFS): TBD  UPPER EXTREMITY ROM:    Active ROM Left  Eval WNL throughout Right OT d/c on 08/12/23 (Previous episode of care) Right Eval on 01/29/24  Right 04/15/24  Shoulder flexion  133 (135) 125 (134) 125 (134)  Shoulder abduction  120 (125) 90 (104) 122 (125)  Shoulder adduction      Shoulder extension      Shoulder internal rotation  R thumb to lumbar spine (better clearance of SI joint) To R  side of lower back-good clearance of SI joint To R side of mid back-good clearance of SI joint   Shoulder external rotation  Hand to back of head without chin tuck and good abd  63* with arm abd (Slight scaption) 78*  Elbow flexion      Elbow extension      Wrist flexion      Wrist extension      Wrist ulnar deviation      Wrist radial deviation      Wrist pronation      Wrist supination      (Blank rows = not tested)  UPPER EXTREMITY MMT:     MMT Left Eval 5/5 throughout Right OT d/c on 08/12/23 (Previous episode of care) Right Eval 01/29/24 Right 04/15/24  Shoulder flexion  4- 4 4+  Shoulder abduction  4- 4 4  Shoulder adduction      Shoulder extension      Shoulder internal rotation  4 4 4+  Shoulder external rotation  4- 4 4  Middle trapezius      Lower trapezius      Elbow flexion  4+ 4+ 4+  Elbow extension  5 5 5   Wrist flexion  4+ 4+ 4+  Wrist extension  4+ 4+ 4+  Wrist ulnar deviation      Wrist radial deviation      Wrist pronation      Wrist supination      (Blank rows = not tested)  HAND FUNCTION: 08/12/23: Grip strength: Right: 14 lbs, Left 60 lbs; lateral pinch: Right: 6 lbs, Left: 16 lbs , 3 point pinch: Right: 5 (IP flexion; Saehan pinch gauge), Left: 19 lbs  01/29/24 Eval: Grip strength: Right: 18 lbs; Left: 55 lbs, Lateral pinch: Right: 5 lbs, Left: 14 lbs, and 3 point pinch: Right:  Fingers slip- standard pinch gauge used 2 lbs, Left: 15 lbs 04/15/24: Grip strength: Right 28 lbs; Lateral pinch: Right: 8 lbs, 3 point pinch: 3 lbs (fingers slipped)  COORDINATION: 08/12/23: Right: 5 min and 19 sec; 2nd trial 3 min 49 sec, Left 23 sec,  01/29/24 Eval: 9 Hole Peg test: Right: 2 min 41 sec  sec; Left: 23 sec 04/15/24: Attempted/increased challenge and frustration with this today; will attempt next visit 04/22/24: 9 Hole Peg test: Right: 5 min 3 sec (when un-timed pt completes in ~ 1/2 the time)  SENSATION: Light touch: Impaired , tingling in the R hand   EDEMA:  No visible edema  MUSCLE TONE: RUE: Mild and Moderate (Last round of Botox on 01/21/24)  COGNITION: Overall cognitive status: Within functional limits for tasks assessed  VISION: WFL; no recent changes  PERCEPTION: WFL  PRAXIS: Impaired: Motor planning and Clonus R hand  OBSERVATIONS:  Pt remains very motivated to increase functional use of RUE for daily tasks.                                                                                                              TREATMENT DATE: 04/22/24 Therapeutic Activity: -Re-attempt at 9 hole peg test from last visit; see above under coordination -Facilitated R hand FMC/dexterity skills working to [employer] stones: Trials of scooping multiple stones into palm and use of 2 and 3 point pinch patterns to grasp/release stones.  Use of non-skid mat to pick up stones from table top, and positioned stones flat side up for easier pick up. -Trials of translatory skills working to flip circular block 90* and moving block from palm to fingertips to attempt repositioning.   Neuro re-ed: -Ktape applied to R hand digits 1-3 on volar aspect to promote digit extension to minimize flexor tone in PIP and DIP joints of these digits.  -Intermittent vc throughout sessions to initiate self passive wrist and digit ext and WB through the hand on table top to normalize flexor tone in the hand.  PATIENT EDUCATION: Education details: R hand FMC/dexterity skills; use of relaxation strategies to improve R hand function under stress Person educated: Patient Education method: Explanation, demonstration Education comprehension: verbalized understanding  HOME EXERCISE PROGRAM: Pink theraputty,  self passive stretching for R hand digit ext/thumb flex/abd  GOALS: Goals reviewed with patient? Yes  SHORT TERM GOALS: Target date: 03/11/24  Pt will be indep to perform HEP for improving R hand strength and coordination for daily tasks. Baseline: Eval: Not yet  initiated; 04/15/24: indep with theraputty and RUE stretching Goal status: achieved   LONG TERM GOALS: Target date: 05/28/24  Pt will increase UEFS by 10 or more points to indicate self perceived functional improvement when engaging the RUE into daily tasks. Baseline: Eval: TBD; 04/15/24: D/c/ NT Goal status: d/c  2.  Pt will increase R grip strength by 5 or more lbs to securely stabilize containers and jars for easier opening.  Baseline: Eval: R grip strength 18 lbs (L 55 lbs); 04/15/24: R grip strength 28 lbs Goal status: exceeded/achieved  3.  Pt will increase R hand dexterity/FMC skills to manipulate clothing fasteners with bilat hands.  Baseline: Eval: Unable to hook bra and manages buttons with L hand only; 04/15/24: now able to hook bra bimanually, able to wear button up shirt today with extra time to fasten; multiple attempts at 9 hole peg test today before test terminated d/t frustration  Goal status: in progress  4.  Pt will increase R active shoulder flexion to 140* or better to achieve functional ROM for UB ADLs and reaching for ADL supplies.  Baseline: Eval: R shoulder flexion 125*; difficulty reaching overhead for ADL supplies; 04/15/24: 125 (134); focus has been on R hand dexterity skills in most sessions per pt request; but pt reports consistent stretching at home Goal status: in progress  ASSESSMENT:  CLINICAL IMPRESSION: Pt reports she's still getting used to her R Softpro resting hand splint at night time, but she feels some improvement in her R hand stiffness.  Pt continues to prefer application of Ktape to minimize PIP and DIP flexor tone, allowing improved ability to maintain a 2 and 3 point pinch pattern.  Though pt was able to complete 9 hole peg test today, completion time was much greater than recent past attempts (>5 min, see above), but pt demonstrated ability to perform in ~ 1/2 the time when not being timed d/t feeling less pressure.  Pt's R hand tremor remains  disruptive to pt's performance with [facility] skills.  Pt continues to improve with ability to sustain a light 2 point and 3 point pinch pattern with smaller items such as circular blocks and glass stones, but when pinching with more force, objects still tend to slip from hand, and pt continues to struggle with repositioning these items when working to move them from palm to fingertips.  Pt will continue to benefit from skilled OT to address RUE deficits in order to improve indep and efficiency when engaging the RUE into daily tasks.    PERFORMANCE DEFICITS: in functional skills including ADLs, IADLs, coordination, dexterity, sensation, tone, ROM, strength, pain, flexibility, Fine motor control, Gross motor control, mobility, balance, body mechanics, endurance, decreased knowledge of use of DME, and UE functional use, and psychosocial skills including coping strategies, environmental adaptation, habits, and routines and behaviors.   IMPAIRMENTS: are limiting patient from ADLs, IADLs, work, leisure, and social participation.   CO-MORBIDITIES: has co-morbidities such as CHF, asthma, depression that affects occupational performance. Patient will benefit from skilled OT to address above impairments and improve overall function.  MODIFICATION OR ASSISTANCE TO COMPLETE EVALUATION: No modification of tasks or assist necessary to complete an evaluation.  OT OCCUPATIONAL PROFILE AND HISTORY: Detailed assessment: Review of  records and additional review of physical, cognitive, psychosocial history related to current functional performance.  CLINICAL DECISION MAKING: Moderate - several treatment options, min-mod task modification necessary  REHAB POTENTIAL: Good  EVALUATION COMPLEXITY: Moderate    PLAN:  OT FREQUENCY: 1-2x/week  OT DURATION: 12 weeks  PLANNED INTERVENTIONS: 97168 OT Re-evaluation, 97535 self care/ADL training, 60630 therapeutic exercise, 97530 therapeutic activity, 97112 neuromuscular  re-education, 97140 manual therapy, 97010 moist heat, 97010 cryotherapy, passive range of motion, balance training, functional mobility training, psychosocial skills training, energy conservation, coping strategies training, patient/family education, and DME and/or AE instructions  RECOMMENDED OTHER SERVICES: Pt completed PT evaluation this date  CONSULTED AND AGREED WITH PLAN OF CARE: Patient  PLAN FOR NEXT SESSION: see above  Marcus Sewer, MS, OTR/L  Casandra Claw, OT 04/22/2024, 3:36 PM

## 2024-04-29 ENCOUNTER — Ambulatory Visit: Payer: Medicaid Other | Admitting: Physical Therapy

## 2024-04-29 ENCOUNTER — Ambulatory Visit: Payer: Medicaid Other

## 2024-05-04 ENCOUNTER — Ambulatory Visit (INDEPENDENT_AMBULATORY_CARE_PROVIDER_SITE_OTHER)

## 2024-05-04 DIAGNOSIS — Z23 Encounter for immunization: Secondary | ICD-10-CM

## 2024-05-04 NOTE — Progress Notes (Signed)
 Patient is in office today for a nurse visit for Immunization. Patient Injection was given in the  Right deltoid. Patient tolerated injection well.

## 2024-05-06 ENCOUNTER — Ambulatory Visit: Payer: Medicaid Other | Attending: Internal Medicine

## 2024-05-06 ENCOUNTER — Ambulatory Visit: Payer: Medicaid Other | Admitting: Physical Therapy

## 2024-05-06 DIAGNOSIS — R278 Other lack of coordination: Secondary | ICD-10-CM | POA: Insufficient documentation

## 2024-05-06 DIAGNOSIS — R2689 Other abnormalities of gait and mobility: Secondary | ICD-10-CM | POA: Insufficient documentation

## 2024-05-06 DIAGNOSIS — R262 Difficulty in walking, not elsewhere classified: Secondary | ICD-10-CM | POA: Diagnosis not present

## 2024-05-06 DIAGNOSIS — M6281 Muscle weakness (generalized): Secondary | ICD-10-CM | POA: Insufficient documentation

## 2024-05-06 DIAGNOSIS — R2681 Unsteadiness on feet: Secondary | ICD-10-CM | POA: Diagnosis present

## 2024-05-06 DIAGNOSIS — I6930 Unspecified sequelae of cerebral infarction: Secondary | ICD-10-CM | POA: Insufficient documentation

## 2024-05-06 DIAGNOSIS — Z7689 Persons encountering health services in other specified circumstances: Secondary | ICD-10-CM | POA: Diagnosis not present

## 2024-05-09 NOTE — Therapy (Signed)
 OUTPATIENT OCCUPATIONAL THERAPY NEURO RECERTIFICATION AND TREATMENT NOTE   Patient Name: Anna Tapia MRN: [id number] DOB:1991/02/17, 33 y.o., female Today's Date: 05/09/2024  PCP: Dr. Sharyle Fischer  REFERRING PROVIDER: Dr. Sharyle Fischer  END OF SESSION:  OT End of Session - 05/06/24 1449      Visit Number 12    Number of Visits 13    Date for OT Re-Evaluation 05/28/24     Authorization Type [employer]; shara required     Authorization Time Period 12 OT visits approved between 02/05/24-05/02/24 (eval not counted in visit count)     Authorization - Visit Number 11     Authorization - Number of Visits 12     Progress Note Due on Visit 20     OT Start Time 1445     OT Stop Time 1530     OT Time Calculation (min) 45 min     Activity Tolerance Patient tolerated treatment well     Behavior During Therapy WFL for tasks assessed/performed    Past Medical History:  Diagnosis Date   Arteriovenous malformation of brain    a. s/p remote coiling.   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   Congenital CHF (congestive heart failure) (HCC)    a. 03/2023 Echo: EF 60-65%, no rwma, nl RV fxn, RVSP 29.17mmHg. Mild MR. Mild Ao sclerosis w/o stenosis. Ao root 38mm.   COVID 2022   Hemorrhagic stroke (HCC) 2023   a. L-sided midbrain, thalamic ICH in setting of PCA/ACA aneurysm s/p embolization.   History of broken collarbone 2020   Hydrocephalus (HCC)    a. s/p VP shunt in childhood.   Precordial chest pain    a. 03/2023 MV: No ischemia or infarct.  EF greater than 65%.  No significant coronary calcification.   Sepsis secondary to UTI (HCC) 2021   Past Surgical History:  Procedure Laterality Date   VENTRICULOPERITONEAL SHUNT     Patient Active Problem List   Diagnosis Date Noted   History of CVA with residual deficit 11/25/2023   CHF (congestive heart failure) (HCC) 03/25/2023   Left-sided nontraumatic intracerebral hemorrhage (HCC)  08/01/2022   AVM (arteriovenous malformation) brain 07/24/2022   Asthma 07/24/2022   Major depressive disorder, recurrent episode, moderate (HCC) 02/07/2022   Sepsis secondary to UTI (HCC) 05/01/2020   VP (ventriculoperitoneal) shunt status 05/01/2020   Right ovarian cyst 05/01/2020   Elevated LFTs 05/01/2020   ONSET DATE: Sept 2023  REFERRING DIAG: I69.30 (ICD-10-CM) - History of CVA with residual deficit   THERAPY DIAG:  Muscle weakness (generalized)  Other lack of coordination  History of CVA with residual deficit  Rationale for Evaluation and Treatment: Rehabilitation  SUBJECTIVE:  SUBJECTIVE STATEMENT: Pt verbalizes oval 8 splints on R thumb and IF help when trying to grasp small items in R hand. Pt accompanied by: self, aunt  PERTINENT HISTORY: Pt known to this clinic after participation in therapy in 2024 following her L CVA in 2023.  Pt was discharged from OT in Oct of 2024 d/t visit limitations with therapy.  Pt returns today to address RUE residual deficits from CVA after most recent Botox injections on 01/21/24.  Hx of left-sided mid-brain and thalamic ICH in 9/23: -Also history of known vein of galen malformation s/p embolization of PCA and ACA feeders 07/29/22 with history of partial embolization as an infant in 1992  PRECAUTIONS: None  WEIGHT BEARING RESTRICTIONS: No  PAIN: 05/06/24:  3/10 pain in R shoulder Are you having pain? Yes: NPRS scale: 4-5/10 R shoulder from recent Botox  Pain location: R shoulder  Pain description: achy Aggravating factors: Botox shot Relieving factors: heat, rest  FALLS: Has patient fallen in last 6 months? No  LIVING ENVIRONMENT: Lives with: lives with their family mother and 3 brothers, but mostly staying with her aunt  Lives in: ground level apartment with aunt  Stairs: Yes: External: 3 steps; bilateral but cannot reach both Has following equipment at home: Single point cane  PLOF: Independent prior to CVA  PATIENT GOALS:  Using my arm more.   OBJECTIVE:  Note: Objective measures were completed at Evaluation unless otherwise noted.  HAND DOMINANCE: Left  ADLs: Overall ADLs: Pt reports that she tries to engage the R hand into everything she does Transfers/ambulation related to ADLs: indep Eating: cuts food with L hand Grooming: increased time to engage the R  UB Dressing: Increased time with clothing fasteners LB Dressing: wears slip on shoes; wore jeans today and did ok with the clothing fasteners, extra time  Toileting: indep Bathing: occasional pain when reaching over with the R arm to bathe the L arm (R pec and shoulder pain) Tub Shower transfers: modified indep Equipment: Shower seat with back, Grab bars, and hand held shower hose   IADLs: Shopping: pt can go shopping accompanied by a family member  Light housekeeping: pt reports unable to sweep; pt reports she gets dizzy  Meal Prep: Pt reports she now cooks about 3 nights a week, but has difficulty opening containers/jars/food packages in the [employer] mobility: uses cane Medication management: modified indep; increased time to engage the R hand Financial management: mother manages (pt states she doesn't really have any bills)  Handwriting: NT (pt is L hand dominant)  MOBILITY STATUS: Uses cane for longer distances, but did not bring cane today  POSTURE COMMENTS:  rounded shoulders Sitting balance: Moves/returns truncal midpoint >2 inches in all planes  ACTIVITY TOLERANCE: Activity tolerance: Pt fatigues with IADLs and community mobility  FUNCTIONAL OUTCOME MEASURES: Upper Extremity Functional Scale (UEFS): TBD  UPPER EXTREMITY ROM:    Active ROM Left  Eval WNL throughout Right OT d/c on 08/12/23 (Previous episode of care) Right Eval on 01/29/24  Right 04/15/24  Shoulder flexion  133 (135) 125 (134) 125 (134)  Shoulder abduction  120 (125) 90 (104) 122 (125)  Shoulder adduction      Shoulder extension      Shoulder  internal rotation  R thumb to lumbar spine (better clearance of SI joint) To R side of lower back-good clearance of SI joint To R side of mid back-good clearance of SI joint   Shoulder external rotation  Hand to back of head without chin tuck and good abd  63* with arm abd (Slight scaption) 78*  Elbow flexion      Elbow extension      Wrist flexion      Wrist extension      Wrist ulnar deviation      Wrist radial deviation      Wrist pronation      Wrist supination      (Blank rows = not tested)  UPPER EXTREMITY MMT:     MMT Left Eval 5/5 throughout Right OT d/c on 08/12/23 (Previous episode of care) Right Eval 01/29/24 Right 04/15/24  Shoulder flexion  4- 4 4+  Shoulder abduction  4- 4 4  Shoulder adduction      Shoulder  extension      Shoulder internal rotation  4 4 4+  Shoulder external rotation  4- 4 4  Middle trapezius      Lower trapezius      Elbow flexion  4+ 4+ 4+  Elbow extension  5 5 5   Wrist flexion  4+ 4+ 4+  Wrist extension  4+ 4+ 4+  Wrist ulnar deviation      Wrist radial deviation      Wrist pronation      Wrist supination      (Blank rows = not tested)  HAND FUNCTION: 08/12/23: Grip strength: Right: 14 lbs, Left 60 lbs; lateral pinch: Right: 6 lbs, Left: 16 lbs , 3 point pinch: Right: 5 (IP flexion; Saehan pinch gauge), Left: 19 lbs  01/29/24 Eval: Grip strength: Right: 18 lbs; Left: 55 lbs, Lateral pinch: Right: 5 lbs, Left: 14 lbs, and 3 point pinch: Right:  Fingers slip- standard pinch gauge used 2 lbs, Left: 15 lbs 04/15/24: Grip strength: Right 28 lbs; Lateral pinch: Right: 8 lbs, 3 point pinch: 3 lbs (fingers slipped)  COORDINATION: 08/12/23: Right: 5 min and 19 sec; 2nd trial 3 min 49 sec, Left 23 sec,  01/29/24 Eval: 9 Hole Peg test: Right: 2 min 41 sec  sec; Left: 23 sec 04/15/24: Attempted/increased challenge and frustration with this today; will attempt next visit 04/22/24: 9 Hole Peg test: Right: 5 min 3 sec (when un-timed pt completes in ~ 1/2  the time)  SENSATION: Light touch: Impaired , tingling in the R hand   EDEMA: No visible edema  MUSCLE TONE: RUE: Mild and Moderate (Last round of Botox on 01/21/24)  COGNITION: Overall cognitive status: Within functional limits for tasks assessed  VISION: WFL; no recent changes  PERCEPTION: WFL  PRAXIS: Impaired: Motor planning and Clonus R hand  OBSERVATIONS:  Pt remains very motivated to increase functional use of RUE for daily tasks.                                                                                                              TREATMENT DATE: 05/06/24 Therapeutic Activity: -Promoted R hand FMC/dexterity skills and RUE GMC skills working to pick up and reach/place objects of various sizes and shapes, including Connect 4 pieces from table to Connect 4 board, 1 velcro blocks from resistive velcro board. -Attempted use of wide base resistive tweezers to pick up small cubed beads, but pt struggled to sustain pinch on tweezers without fingers slipping from grip. -Practiced picking up objects above with and without use of oval 8 splints to R thumb and IF, used to limit flexor tone and sustain pinch on small objects without fingers slipping.   Self Care: -Reviewed grooming strategies for shaving underarm hair; electric razor with wide grip recommended -Pt able to hold clinic's electric razor in R hand and simulate shaving L axilla with good ability to maintain grasp.  OT advised on options to obtain and encouraged pt to look for wide base razors   PATIENT EDUCATION: Education details: Grooming  strategies Person educated: Patient Education method: Explanation, demonstration Education comprehension: verbalized understanding  HOME EXERCISE PROGRAM: Pink theraputty, self passive stretching for R hand digit ext/thumb flex/abd  GOALS: Goals reviewed with patient? Yes  SHORT TERM GOALS: Target date: 03/11/24  Pt will be indep to perform HEP for improving R hand strength and  coordination for daily tasks. Baseline: Eval: Not yet initiated; 04/15/24: indep with theraputty and RUE stretching Goal status: achieved   LONG TERM GOALS: Target date: 05/28/24  Pt will increase UEFS by 10 or more points to indicate self perceived functional improvement when engaging the RUE into daily tasks. Baseline: Eval: TBD; 04/15/24: D/c/ NT Goal status: d/c  2.  Pt will increase R grip strength by 5 or more lbs to securely stabilize containers and jars for easier opening.  Baseline: Eval: R grip strength 18 lbs (L 55 lbs); 04/15/24: R grip strength 28 lbs Goal status: exceeded/achieved  3.  Pt will increase R hand dexterity/FMC skills to manipulate clothing fasteners with bilat hands.  Baseline: Eval: Unable to hook bra and manages buttons with L hand only; 04/15/24: now able to hook bra bimanually, able to wear button up shirt today with extra time to fasten; multiple attempts at 9 hole peg test today before test terminated d/t frustration  Goal status: in progress  4.  Pt will increase R active shoulder flexion to 140* or better to achieve functional ROM for UB ADLs and reaching for ADL supplies.  Baseline: Eval: R shoulder flexion 125*; difficulty reaching overhead for ADL supplies; 04/15/24: 125 (134); focus has been on R hand dexterity skills in most sessions per pt request; but pt reports consistent stretching at home Goal status: in progress  ASSESSMENT:  CLINICAL IMPRESSION: Pt continues to find benefit from [place] and intermittent use of oval 8 splints on R thumb and IF, used to limit flexor tone and sustain pinch on small objects without fingers slipping. Pt reports difficulty shaving underarms with her current narrow base razor, requiring assist from aunt to shave these areas.  Pt demonstrated good ability to maintain grasp of wide base electric razor to simulate shaving L under arm today, and pt receptive to look for similar razor to maximize indep with this grooming task.  Pt  still has 1 remaining visit approved from the initial 12, but date extension is needed as pt had 2 tx cancellations this period relating to transportation issues and illness.  Once extension is approved, will plan to take objective measures to update goals and plan goal revisions as needed, as pt continues to make progress with [facility] skills and functional use of RUE with daily tasks.    PERFORMANCE DEFICITS: in functional skills including ADLs, IADLs, coordination, dexterity, sensation, tone, ROM, strength, pain, flexibility, Fine motor control, Gross motor control, mobility, balance, body mechanics, endurance, decreased knowledge of use of DME, and UE functional use, and psychosocial skills including coping strategies, environmental adaptation, habits, and routines and behaviors.   IMPAIRMENTS: are limiting patient from ADLs, IADLs, work, leisure, and social participation.   CO-MORBIDITIES: has co-morbidities such as CHF, asthma, depression that affects occupational performance. Patient will benefit from skilled OT to address above impairments and improve overall function.  MODIFICATION OR ASSISTANCE TO COMPLETE EVALUATION: No modification of tasks or assist necessary to complete an evaluation.  OT OCCUPATIONAL PROFILE AND HISTORY: Detailed assessment: Review of records and additional review of physical, cognitive, psychosocial history related to current functional performance.  CLINICAL DECISION MAKING: Moderate - several treatment options,  min-mod task modification necessary  REHAB POTENTIAL: Good  EVALUATION COMPLEXITY: Moderate    PLAN:  OT FREQUENCY: 1-2x/week  OT DURATION: 12 weeks  PLANNED INTERVENTIONS: 97168 OT Re-evaluation, 97535 self care/ADL training, 02889 therapeutic exercise, 97530 therapeutic activity, 97112 neuromuscular re-education, 97140 manual therapy, 97010 moist heat, 97010 cryotherapy, passive range of motion, balance training, functional mobility training,  psychosocial skills training, energy conservation, coping strategies training, patient/family education, and DME and/or AE instructions  RECOMMENDED OTHER SERVICES: Pt completed PT evaluation this date  CONSULTED AND AGREED WITH PLAN OF CARE: Patient  PLAN FOR NEXT SESSION: see above  Inocente Blazing, MS, OTR/L  Inocente MARLA Blazing, OT 05/09/2024, 6:57 PM

## 2024-05-13 ENCOUNTER — Ambulatory Visit: Payer: Medicaid Other | Admitting: Physical Therapy

## 2024-05-13 ENCOUNTER — Ambulatory Visit: Payer: Medicaid Other

## 2024-05-15 DIAGNOSIS — Z419 Encounter for procedure for purposes other than remedying health state, unspecified: Secondary | ICD-10-CM | POA: Diagnosis not present

## 2024-05-17 ENCOUNTER — Other Ambulatory Visit: Payer: Self-pay | Admitting: Internal Medicine

## 2024-05-17 DIAGNOSIS — F331 Major depressive disorder, recurrent, moderate: Secondary | ICD-10-CM

## 2024-05-18 NOTE — Telephone Encounter (Signed)
 Requested Prescriptions  Pending Prescriptions Disp Refills   DULoxetine  (CYMBALTA ) 20 MG capsule [Pharmacy Med Name: DULoxetine  HCl 20 MG Oral Capsule Delayed Release Particles] 180 capsule 1    Sig: Take 2 capsules by mouth once daily     Psychiatry: Antidepressants - SNRI - duloxetine  Passed - 05/18/2024  3:07 PM      Passed - Cr in normal range and within 360 days    Creat  Date Value Ref Range Status  04/02/2024 0.61 0.50 - 0.97 mg/dL Final         Passed - eGFR is 30 or above and within 360 days    EGFR (African American)  Date Value Ref Range Status  09/19/2014 >60 >85mL/min Final   GFR calc Af Amer  Date Value Ref Range Status  05/06/2020 >60 >60 mL/min Final   EGFR (Non-African Amer.)  Date Value Ref Range Status  09/19/2014 >60 >12mL/min Final    Comment:    eGFR values <38mL/min/1.73 m2 may be an indication of chronic kidney disease (CKD). Calculated eGFR, using the MRDR Study equation, is useful in  patients with stable renal function. The eGFR calculation will not be reliable in acutely ill patients when serum creatinine is changing rapidly. It is not useful in patients on dialysis. The eGFR calculation may not be applicable to patients at the low and high extremes of body sizes, pregnant women, and vegetarians.    GFR, Estimated  Date Value Ref Range Status  07/24/2022 >60 >60 mL/min Final    Comment:    (NOTE) Calculated using the CKD-EPI Creatinine Equation (2021)    eGFR  Date Value Ref Range Status  04/02/2024 121 > OR = 60 mL/min/1.28m2 Final         Passed - Completed PHQ-2 or PHQ-9 in the last 360 days      Passed - Last BP in normal range    BP Readings from Last 1 Encounters:  04/02/24 128/82         Passed - Valid encounter within last 6 months    Recent Outpatient Visits           1 month ago Annual physical exam   [facility] Bernardo Fend, DO   4 months ago Exposure to the flu   [facility] Bernardo Fend, DO       Future Appointments             In 4 months Bernardo Fend, DO [facility]

## 2024-05-20 ENCOUNTER — Other Ambulatory Visit: Payer: Self-pay | Admitting: Internal Medicine

## 2024-05-20 ENCOUNTER — Ambulatory Visit: Payer: Medicaid Other | Admitting: Physical Therapy

## 2024-05-20 ENCOUNTER — Other Ambulatory Visit: Payer: Self-pay

## 2024-05-20 ENCOUNTER — Encounter: Payer: Self-pay | Admitting: Emergency Medicine

## 2024-05-20 ENCOUNTER — Emergency Department

## 2024-05-20 ENCOUNTER — Ambulatory Visit: Payer: Medicaid Other

## 2024-05-20 ENCOUNTER — Telehealth: Payer: Self-pay | Admitting: Emergency Medicine

## 2024-05-20 ENCOUNTER — Emergency Department
Admission: EM | Admit: 2024-05-20 | Discharge: 2024-05-21 | Disposition: A | Discharge status: Home / Self Care | Attending: Emergency Medicine | Admitting: Emergency Medicine

## 2024-05-20 DIAGNOSIS — I6930 Unspecified sequelae of cerebral infarction: Secondary | ICD-10-CM

## 2024-05-20 DIAGNOSIS — Z8616 Personal history of COVID-19: Secondary | ICD-10-CM | POA: Insufficient documentation

## 2024-05-20 DIAGNOSIS — J45909 Unspecified asthma, uncomplicated: Secondary | ICD-10-CM | POA: Diagnosis not present

## 2024-05-20 DIAGNOSIS — R519 Headache, unspecified: Secondary | ICD-10-CM | POA: Insufficient documentation

## 2024-05-20 DIAGNOSIS — R278 Other lack of coordination: Secondary | ICD-10-CM | POA: Diagnosis not present

## 2024-05-20 DIAGNOSIS — M6281 Muscle weakness (generalized): Secondary | ICD-10-CM | POA: Diagnosis not present

## 2024-05-20 DIAGNOSIS — R262 Difficulty in walking, not elsewhere classified: Secondary | ICD-10-CM | POA: Diagnosis not present

## 2024-05-20 DIAGNOSIS — R030 Elevated blood-pressure reading, without diagnosis of hypertension: Secondary | ICD-10-CM

## 2024-05-20 DIAGNOSIS — I771 Stricture of artery: Secondary | ICD-10-CM | POA: Diagnosis not present

## 2024-05-20 DIAGNOSIS — I509 Heart failure, unspecified: Secondary | ICD-10-CM | POA: Insufficient documentation

## 2024-05-20 DIAGNOSIS — R2689 Other abnormalities of gait and mobility: Secondary | ICD-10-CM | POA: Diagnosis not present

## 2024-05-20 DIAGNOSIS — Z7689 Persons encountering health services in other specified circumstances: Secondary | ICD-10-CM | POA: Diagnosis not present

## 2024-05-20 DIAGNOSIS — I613 Nontraumatic intracerebral hemorrhage in brain stem: Secondary | ICD-10-CM | POA: Diagnosis not present

## 2024-05-20 LAB — CBC WITH DIFFERENTIAL/PLATELET
Abs Immature Granulocytes: 0.02 K/uL (ref 0.00–0.07)
Basophils Absolute: 0.1 K/uL (ref 0.0–0.1)
Basophils Relative: 1 %
Eosinophils Absolute: 0.2 K/uL (ref 0.0–0.5)
Eosinophils Relative: 2 %
HCT: 38 % (ref 36.0–46.0)
Hemoglobin: 12.1 g/dL (ref 12.0–15.0)
Immature Granulocytes: 0 %
Lymphocytes Relative: 32 %
Lymphs Abs: 3.1 K/uL (ref 0.7–4.0)
MCH: 27.6 pg (ref 26.0–34.0)
MCHC: 31.8 g/dL (ref 30.0–36.0)
MCV: 86.8 fL (ref 80.0–100.0)
Monocytes Absolute: 0.9 K/uL (ref 0.1–1.0)
Monocytes Relative: 9 %
Neutro Abs: 5.4 K/uL (ref 1.7–7.7)
Neutrophils Relative %: 56 %
Platelets: 334 K/uL (ref 150–400)
RBC: 4.38 MIL/uL (ref 3.87–5.11)
RDW: 13.2 % (ref 11.5–15.5)
WBC: 9.6 K/uL (ref 4.0–10.5)
nRBC: 0 % (ref 0.0–0.2)

## 2024-05-20 LAB — BASIC METABOLIC PANEL WITH GFR
Anion gap: 10 (ref 5–15)
BUN: 7 mg/dL (ref 6–20)
CO2: 27 mmol/L (ref 22–32)
Calcium: 9 mg/dL (ref 8.9–10.3)
Chloride: 102 mmol/L (ref 98–111)
Creatinine, Ser: 0.64 mg/dL (ref 0.44–1.00)
GFR, Estimated: >60 mL/min (ref >60)
Glucose, Bld: 82 mg/dL (ref 70–99)
Potassium: 3.6 mmol/L (ref 3.5–5.1)
Sodium: 139 mmol/L (ref 135–145)

## 2024-05-20 LAB — HCG, QUANTITATIVE, PREGNANCY: hCG, Beta Chain, Quant, S: <1 m[IU]/mL (ref <5)

## 2024-05-20 MED ORDER — ACETAMINOPHEN 500 MG PO TABS
1000.0000 mg | ORAL_TABLET | Freq: Once | ORAL | Status: AC
  Filled 2024-05-20: qty 2

## 2024-05-20 MED ORDER — ACETAMINOPHEN 500 MG PO TABS
ORAL_TABLET | ORAL | Status: AC
  Filled 2024-05-20: qty 1

## 2024-05-20 MED ORDER — SODIUM CHLORIDE 0.9 % IV BOLUS
1000.0000 mL | Freq: Once | INTRAVENOUS | Status: AC
  Administered 2024-05-20: 1000 mL via INTRAVENOUS

## 2024-05-20 MED ORDER — ACETAMINOPHEN 500 MG PO TABS
ORAL_TABLET | ORAL | Status: AC
  Administered 2024-05-20: 1000 mg via ORAL
  Filled 2024-05-20: qty 2

## 2024-05-20 MED ORDER — ADULT BLOOD PRESSURE CUFF LG KIT: 1.0000 | PACK | Freq: Every day | 0 refills | Status: DC

## 2024-05-20 MED ORDER — IOHEXOL 350 MG/ML SOLN
75.0000 mL | Freq: Once | INTRAVENOUS | Status: AC | PRN
  Administered 2024-05-20: 75 mL via INTRAVENOUS

## 2024-05-20 MED ORDER — METOCLOPRAMIDE HCL 5 MG/ML IJ SOLN
10.0000 mg | INTRAMUSCULAR | Status: AC
  Administered 2024-05-20: 10 mg via INTRAVENOUS
  Filled 2024-05-20: qty 2

## 2024-05-20 NOTE — ED Provider Notes (Signed)
 [facility] Provider Note    Event Date/Time   First MD Initiated Contact with Patient 05/20/24 1940     (approximate)   History   Chief Complaint: Hypertension   HPI  Anna Tapia is a 33 y.o. female with a past history of brain aneurysm and hemorrhagic stroke status post embolization who comes to the ED for evaluation of headache.  She reports episodic headaches which are typically mild, but since yesterday has been having headache that is more persistent and more severe than usual.  Similar nature, feels like pressure in bilateral forehead.  No vision changes or acute paresthesias or motor weakness or other new neurologic symptoms.   Outside records reviewed noting neurology evaluation earlier today which was baseline.     Past Medical History:  Diagnosis Date   Arteriovenous malformation of brain    a. s/p remote coiling.   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   Congenital CHF (congestive heart failure) (HCC)    a. 03/2023 Echo: EF 60-65%, no rwma, nl RV fxn, RVSP 29.29mmHg. Mild MR. Mild Ao sclerosis w/o stenosis. Ao root 38mm.   COVID 2022   Hemorrhagic stroke (HCC) 2023   a. L-sided midbrain, thalamic ICH in setting of PCA/ACA aneurysm s/p embolization.   History of broken collarbone 2020   Hydrocephalus (HCC)    a. s/p VP shunt in childhood.   Precordial chest pain    a. 03/2023 MV: No ischemia or infarct.  EF greater than 65%.  No significant coronary calcification.   Sepsis secondary to UTI [facility]) 2021    Current Outpatient Rx   Order #: [id number] Class: Normal   Order #: [id number] Class: Normal   Order #: [id number] Class: Normal   Order #: [id number] Class: Normal   Order #: [id number] Class: Normal   Order #: [id number] Class: Normal   Order #: [id number] Class: Normal   Order #: [id number] Class: Normal   Order #: [id number] Class: Historical Med    Past Surgical History:  Procedure Laterality Date    VENTRICULOPERITONEAL SHUNT      Physical Exam   Triage Vital Signs: ED Triage Vitals  Encounter Vitals Group     BP 05/20/24 1557 (!) 156/101     Girls Systolic BP Percentile --      Girls Diastolic BP Percentile --      Boys Systolic BP Percentile --      Boys Diastolic BP Percentile --      Pulse Rate 05/20/24 1557 81     Resp 05/20/24 1557 17     Temp 05/20/24 1557 98.5 F (36.9 C)     Temp Source 05/20/24 1557 Oral     SpO2 05/20/24 1557 100 %     Weight 05/20/24 1558 184 lb (83.5 kg)     Height 05/20/24 1558 5' (1.524 m)     Head Circumference --      Peak Flow --      Pain Score 05/20/24 1557 5     Pain Loc --      Pain Education --      Exclude from Growth Chart --     Most recent vital signs: Vitals:   05/20/24 2015 05/20/24 2152  BP:  139/86  Pulse:  83  Resp:  (!) 24  Temp: 98.3 F (36.8 C)   SpO2:  100%    General: Awake, no distress.  CV:  Good peripheral perfusion.  Regular rate rhythm  Resp:  Normal effort.  Abd:  No distention.  Other:  Cranial nerves III through XII intact.   ED Results / Procedures / Treatments   Labs (all labs ordered are listed, but only abnormal results are displayed) Labs Reviewed  CBC WITH DIFFERENTIAL/PLATELET  BASIC METABOLIC PANEL WITH GFR     EKG    RADIOLOGY CTA head neck pending   PROCEDURES:  Procedures   MEDICATIONS ORDERED IN ED: Medications  acetaminophen  (TYLENOL ) 500 MG tablet (has no administration in time range)  sodium chloride  0.9 % bolus 1,000 mL (1,000 mLs Intravenous New Bag/Given 05/20/24 2224)  metoCLOPramide  (REGLAN ) injection 10 mg (10 mg Intravenous Given 05/20/24 2223)  acetaminophen  (TYLENOL ) tablet 1,000 mg (1,000 mg Oral Given 05/20/24 2222)  iohexol  (OMNIPAQUE ) 350 MG/ML injection 75 mL (75 mLs Intravenous Contrast Given 05/20/24 2212)     IMPRESSION / MDM / ASSESSMENT AND PLAN / ED COURSE  I reviewed the triage vital signs and the nursing notes.  DDx: Cerebral  aneurysm, intracranial hemorrhage, vertebral artery dissection.  Doubt meningitis or sinus thrombosis  Patient's presentation is most consistent with acute presentation with potential threat to life or bodily function.  Patient with history of cerebral aneurysm and hemorrhagic stroke comes ED with acute headache, increased severity compared to prior pattern.  No acute neurologic deficits.  Vital signs unremarkable.  Will obtain CTA head neck, give IV fluids Tylenol  Reglan  for headache relief.       FINAL CLINICAL IMPRESSION(S) / ED DIAGNOSES   Final diagnoses:  Frontal headache     Rx / DC Orders   ED Discharge Orders     None        Note:  This document was prepared using Dragon voice recognition software and may include unintentional dictation errors.   Viviann Pastor, MD 05/20/24 2258

## 2024-05-20 NOTE — ED Notes (Signed)
 Pt ambulatory to use toilet in room at this time.

## 2024-05-20 NOTE — Telephone Encounter (Signed)
 Inocente from [facility] called and stated the patient was there for appointment to day and her blood pressure was 133/95 there and when she was at [facility] earlier it was 151/120. She stated patient has a head ache with a pain level of 6 and it has been getting worse over last few days. Patient was informed to go to ER since she was not feeling well and had symptoms.   Patient would like a order for BP cuff sent to pharmacy for her to keep check on blood pressure. [facility]

## 2024-05-20 NOTE — Therapy (Signed)
 OUTPATIENT OCCUPATIONAL THERAPY NEURO RECERTIFICATION AND TREATMENT NOTE   Patient Name: Anna Tapia MRN: [id number] DOB:[birth year], 33 y.o., female Today's Date: 05/23/2024  PCP: Dr. Sharyle Fischer  REFERRING PROVIDER: Dr. Sharyle Fischer  END OF SESSION:  OT End of Session - 05/23/24 1359     Visit Number 13    Number of Visits 25    Date for OT Re-Evaluation 08/12/24    Authorization Type [employer]; shara required    Authorization Time Period auth update needed    Authorization - Visit Number 12    Authorization - Number of Visits 12    Progress Note Due on Visit 20    OT Start Time 1445    OT Stop Time 1530    OT Time Calculation (min) 45 min    Activity Tolerance Other (comment)   elevated BP   Behavior During Therapy WFL for tasks assessed/performed         Past Medical History:  Diagnosis Date   Arteriovenous malformation of brain    a. s/p remote coiling.   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   Congenital CHF (congestive heart failure) (HCC)    a. 03/2023 Echo: EF 60-65%, no rwma, nl RV fxn, RVSP 29.49mmHg. Mild MR. Mild Ao sclerosis w/o stenosis. Ao root 38mm.   COVID 2022   Hemorrhagic stroke (HCC) 2023   a. L-sided midbrain, thalamic ICH in setting of PCA/ACA aneurysm s/p embolization.   History of broken collarbone 2020   Hydrocephalus (HCC)    a. s/p VP shunt in childhood.   Precordial chest pain    a. 03/2023 MV: No ischemia or infarct.  EF greater than 65%.  No significant coronary calcification.   Sepsis secondary to UTI (HCC) 2021   Past Surgical History:  Procedure Laterality Date   VENTRICULOPERITONEAL SHUNT     Patient Active Problem List   Diagnosis Date Noted   History of CVA with residual deficit 11/25/2023   CHF (congestive heart failure) (HCC) 03/25/2023   Left-sided nontraumatic intracerebral hemorrhage (HCC) 08/01/2022   AVM (arteriovenous malformation) brain 07/24/2022    Asthma 07/24/2022   Major depressive disorder, recurrent episode, moderate (HCC) 02/07/2022   Sepsis secondary to UTI (HCC) 05/01/2020   VP (ventriculoperitoneal) shunt status 05/01/2020   Right ovarian cyst 05/01/2020   Elevated LFTs 05/01/2020   ONSET DATE: Sept 2023  REFERRING DIAG: I69.30 (ICD-10-CM) - History of CVA with residual deficit   THERAPY DIAG:  Muscle weakness (generalized)  Other lack of coordination  History of CVA with residual deficit  Rationale for Evaluation and Treatment: Rehabilitation  SUBJECTIVE:  SUBJECTIVE STATEMENT: Pt reports she was able to get officially discharged from [facility] today with no more follow up visits to neuro.     Pt accompanied by: self, aunt  PERTINENT HISTORY: Pt known to this clinic after participation in therapy in 2024 following her L CVA in 2023.  Pt was discharged from OT in Oct of 2024 d/t visit limitations with therapy.  Pt returns today to address RUE residual deficits from CVA after most recent Botox injections on 01/21/24.  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 Yes:  NPRS scale: 4-5/10 R shoulder from recent Botox  Pain location: R shoulder  Pain description: achy Aggravating factors: Botox shot Relieving factors: heat, rest  FALLS: Has patient fallen in last 6 months? No  LIVING ENVIRONMENT: Lives with: lives with their family mother and 3 brothers, but mostly staying with her aunt  Lives in: ground level apartment with aunt  Stairs: Yes: External: 3 steps; bilateral but cannot reach both Has following equipment at home: Single point cane  PLOF: Independent prior to CVA  PATIENT GOALS: Using my arm more.   OBJECTIVE:  Note:  Objective measures were completed at Evaluation unless otherwise noted.  HAND DOMINANCE: Left  ADLs: Overall ADLs: Pt reports that she tries to engage the R hand into everything she does Transfers/ambulation related to ADLs: indep Eating: cuts food with L hand Grooming: increased time to engage the R  UB Dressing: Increased time with clothing fasteners LB Dressing: wears slip on shoes; wore jeans today and did ok with the clothing fasteners, extra time  Toileting: indep Bathing: occasional pain when reaching over with the R arm to bathe the L arm (R pec and shoulder pain) Tub Shower transfers: modified indep Equipment: Shower seat with back, Grab bars, and hand held shower hose   IADLs: Shopping: pt can go shopping accompanied by a family member  Light housekeeping: pt reports unable to sweep; pt reports she gets dizzy  Meal Prep: Pt reports she now cooks about 3 nights a week, but has difficulty opening containers/jars/food packages in the [employer] mobility: uses cane Medication management: modified indep; increased time to engage the R hand Financial management: mother manages (pt states she doesn't really have any bills)  Handwriting: NT (pt is L hand dominant)  MOBILITY STATUS: Uses cane for longer distances, but did not bring cane today  POSTURE COMMENTS:  rounded shoulders Sitting balance: Moves/returns truncal midpoint >2 inches in all planes  ACTIVITY TOLERANCE: Activity tolerance: Pt fatigues with IADLs and community mobility  FUNCTIONAL OUTCOME MEASURES: Upper Extremity Functional Scale (UEFS): TBD  UPPER EXTREMITY ROM:    Active ROM Left  Eval WNL throughout Right OT d/c on 08/12/23 (Previous episode of care) Right Eval on 01/29/24  Right 04/15/24  Shoulder flexion  133 (135) 125 (134) 125 (134)  Shoulder abduction  120 (125) 90 (104) 122 (125)  Shoulder adduction      Shoulder extension      Shoulder internal rotation  R thumb to lumbar spine  (better clearance of SI joint) To R side of lower back-good clearance of SI joint To R side of mid back-good clearance of SI joint   Shoulder external rotation  Hand to back of head without chin tuck and good abd  63* with arm abd (Slight scaption) 78*  Elbow flexion      Elbow extension      Wrist flexion      Wrist extension      Wrist ulnar deviation      Wrist radial deviation      Wrist pronation      Wrist supination      (Blank rows = not tested)  UPPER EXTREMITY MMT:     MMT Left Eval 5/5 throughout Right OT d/c on 08/12/23 (Previous episode of care) Right Eval 01/29/24 Right 04/15/24  Shoulder flexion  4- 4 4+  Shoulder abduction  4- 4 4  Shoulder adduction      Shoulder extension      Shoulder internal rotation  4 4 4+  Shoulder external rotation  4- 4 4  Middle trapezius      Lower trapezius      Elbow flexion  4+ 4+ 4+  Elbow extension  5 5 5   Wrist flexion  4+ 4+ 4+  Wrist extension  4+ 4+ 4+  Wrist ulnar deviation      Wrist radial deviation      Wrist pronation      Wrist supination      (Blank rows = not tested)  HAND FUNCTION: 08/12/23: Grip strength: Right: 14 lbs, Left 60 lbs; lateral pinch: Right: 6 lbs, Left: 16 lbs , 3 point pinch: Right: 5 (IP flexion; Saehan pinch gauge), Left: 19 lbs  01/29/24 Eval: Grip strength: Right: 18 lbs; Left: 55 lbs, Lateral pinch: Right: 5 lbs, Left: 14 lbs, and 3 point pinch: Right:  Fingers slip- standard pinch gauge used 2 lbs, Left: 15 lbs 04/15/24: Grip strength: Right 28 lbs; Lateral pinch: Right: 8 lbs, 3 point pinch: 3 lbs (fingers slipped)  COORDINATION: 08/12/23: Right: 5 min and 19 sec; 2nd trial 3 min 49 sec, Left 23 sec,  01/29/24 Eval: 9 Hole Peg test: Right: 2 min 41 sec  sec; Left: 23 sec 04/15/24: Attempted/increased challenge and frustration with this today; will attempt next visit 04/22/24: 9 Hole Peg test: Right: 5 min 3 sec (when un-timed pt completes in ~ 1/2 the time) 05/20/24: 9 Hole Peg test: Right:  >5 min to place 7 of 9 pegs with repeat trials   SENSATION: Light touch: Impaired , tingling in the R hand   EDEMA: No visible edema  MUSCLE TONE: RUE: Mild and Moderate (Last round of Botox on 01/21/24)  COGNITION: Overall cognitive status: Within functional limits for tasks assessed  VISION: WFL; no recent changes  PERCEPTION: WFL  PRAXIS: Impaired: Motor planning and Clonus R hand  OBSERVATIONS:  Pt remains very motivated to increase functional use of RUE for daily tasks.                                                                                                              TREATMENT DATE: 05/20/24 Therapeutic Activity: -Repeat trials for 9 hole peg test; see above   Self Care: -Self care routines reviewed d/t pt reporting that her aunt will be out of state for ~1 month and aunt has concerns with pt staying alone.  -Reviewed recommendation for daily checks via phone if pt remains alone in aunt's home vs staying with pt's mother.  -BP checked d/t pt reporting HA and BP reported to be elevated at her [facility] appt this morning. Pt reports BP had been ~151/120 at [facility] earlier today During OT session: 133/100 L arm sitting, ~ 15 min later 133/95, and pt still reporting HA.   -OT called pt's PCP to notify of elevated BP.  OT spoke with Sherrilyn who recommended pt go to ED.  OT assisted pt to ED.  -OT requested PCP send Rx for BP cuff for pt to self monitor at home.  Sherrilyn verbalized she would relay message to PCP.  -OT reviewed s/s of stroke with review of FAST acronym; pt verbalized understanding and was receptive to going to ED.   PATIENT EDUCATION: Education details: S/s of CVA with review of FAST, home safety/ADL routines Person educated: Patient Education method: Explanation Education comprehension: verbalized understanding  HOME EXERCISE PROGRAM: Pink theraputty, self passive stretching for R hand digit ext/thumb flex/abd  GOALS: Goals reviewed with patient? Yes  SHORT  TERM GOALS: Target date: 03/11/24  Pt will be indep to perform HEP for improving R hand strength and coordination for daily tasks. Baseline: Eval: Not yet initiated; 04/15/24: indep with theraputty and RUE stretching Goal status: achieved   LONG TERM GOALS: Target date: 08/12/24  Pt will increase UEFS by 10 or more points to indicate self perceived functional improvement when engaging the RUE into daily tasks. Baseline: Eval: TBD; 04/15/24: D/c/ NT Goal status: d/c  2.  Pt will increase R grip strength by 5 or more lbs to securely stabilize containers and jars for easier opening.  Baseline: Eval: R grip strength 18 lbs (L 55 lbs); 04/15/24: R grip strength 28 lbs Goal status: exceeded/achieved  3.  Pt will increase R hand dexterity/FMC skills to manipulate clothing fasteners with bilat hands.  Baseline: Eval: Unable to hook bra and manages buttons with L hand only; 04/15/24: now able to hook bra bimanually, able to wear button up shirt today with extra time to fasten; multiple attempts at 9 hole peg test today before test terminated d/t frustration  Goal status: in progress  4.  Pt will increase R active shoulder flexion to 140* or better to achieve functional ROM for UB ADLs and reaching for ADL supplies.  Baseline: Eval: R shoulder flexion 125*; difficulty reaching overhead for ADL supplies; 04/15/24: 125 (134); focus has been on R hand dexterity skills in most sessions per pt request; but pt reports consistent stretching at home Goal status: in progress  ASSESSMENT:  CLINICAL IMPRESSION: Recert sent to MD.  Will need to reassess objective measures next visit and make additional goal updates as pt's BP was elevated today and ultimately, focus was transitioned to condition management for BP checks and MD follow up at end of session.  See note above.  Pt was receptive to OT assist to transport pt to ED.  Visit auth needed for additional OT visits after today.  Pt will continue to benefit from  skilled OT to increase engagement of the RUE into daily tasks, with focus on strength and coordination skills in the R hand, and activity modification/AE training, working to maximize pt's indep with ADL/IADL tasks.    PERFORMANCE DEFICITS: in functional skills including ADLs, IADLs, coordination, dexterity, sensation, tone, ROM, strength, pain, flexibility, Fine motor control, Gross motor control, mobility, balance, body mechanics, endurance, decreased knowledge of use of DME, and UE functional use, and psychosocial skills including coping strategies, environmental adaptation, habits, and routines and behaviors.   IMPAIRMENTS: are limiting patient from ADLs, IADLs, work, leisure, and social participation.   CO-MORBIDITIES: has co-morbidities such as CHF, asthma, depression that affects occupational performance. Patient will benefit from skilled OT to address above impairments and improve overall function.  MODIFICATION OR ASSISTANCE TO COMPLETE EVALUATION: No modification of tasks or assist necessary to complete an evaluation.  OT OCCUPATIONAL PROFILE AND HISTORY: Detailed assessment: Review of records and additional review of physical, cognitive, psychosocial history related to current functional performance.  CLINICAL DECISION MAKING: Moderate - several treatment options, min-mod task  modification necessary  REHAB POTENTIAL: Good  EVALUATION COMPLEXITY: Moderate    PLAN:  OT FREQUENCY: 1-2x/week  OT DURATION: 12 weeks  PLANNED INTERVENTIONS: 97168 OT Re-evaluation, 97535 self care/ADL training, 02889 therapeutic exercise, 97530 therapeutic activity, 97112 neuromuscular re-education, 97140 manual therapy, 97010 moist heat, 97010 cryotherapy, passive range of motion, balance training, functional mobility training, psychosocial skills training, energy conservation, coping strategies training, patient/family education, and DME and/or AE instructions  RECOMMENDED OTHER SERVICES: Pt  completed PT evaluation this date  CONSULTED AND AGREED WITH PLAN OF CARE: Patient  PLAN FOR NEXT SESSION: see above  Inocente Blazing, MS, OTR/L  Inocente MARLA Blazing, OT 05/23/2024, 2:08 PM

## 2024-05-20 NOTE — ED Triage Notes (Signed)
 Patient to ED via POV for hypertension. Pt reports appointment at stroke clinic this AM due to hx of stroke and BP was 150's/130's. Denies HTN hx and does not take meds for same. States she has a headache but denies blurred vision.

## 2024-05-21 ENCOUNTER — Other Ambulatory Visit: Payer: Self-pay | Admitting: Emergency Medicine

## 2024-05-21 ENCOUNTER — Telehealth: Payer: Self-pay

## 2024-05-21 DIAGNOSIS — R030 Elevated blood-pressure reading, without diagnosis of hypertension: Secondary | ICD-10-CM

## 2024-05-21 NOTE — Telephone Encounter (Signed)
 Order sent to [facility]

## 2024-05-21 NOTE — Telephone Encounter (Signed)
 Copied from CRM [id number]. Topic: Clinical - Order For Equipment >> May 21, 2024 10:31 AM Treva T wrote: Reason for CRM: Patient calling states she is following up on previous message and request for Blood pressure cuff.  Per patient , called and checked with insurance to inquire on company/locations that would accept and cover an order for a blood pressure cuff with insurance.  Per patient reports, per insurance, will cover at the following locations:  [facility]  [address] [phone]  [facility] [address] [phone]  Patient is requesting a follow on status of which company will be able to send request for blood pressure cuff to. Patient can be reached at [phone].

## 2024-05-25 ENCOUNTER — Other Ambulatory Visit: Payer: Self-pay

## 2024-05-25 ENCOUNTER — Ambulatory Visit (INDEPENDENT_AMBULATORY_CARE_PROVIDER_SITE_OTHER): Admitting: Internal Medicine

## 2024-05-25 ENCOUNTER — Encounter: Payer: Self-pay | Admitting: Internal Medicine

## 2024-05-25 VITALS — BP 140/86 | HR 100 | Temp 98.7°F | Resp 16 | Ht 60.0 in | Wt 176.9 lb

## 2024-05-25 DIAGNOSIS — I10 Essential (primary) hypertension: Secondary | ICD-10-CM

## 2024-05-25 DIAGNOSIS — Z7689 Persons encountering health services in other specified circumstances: Secondary | ICD-10-CM | POA: Diagnosis not present

## 2024-05-25 MED ORDER — ADULT BLOOD PRESSURE CUFF LG KIT: 1.0000 | PACK | Freq: Every day | 0 refills | Status: DC

## 2024-05-25 MED ORDER — LISINOPRIL 5 MG PO TABS: 5.0000 mg | ORAL_TABLET | Freq: Every day | ORAL | 1 refills | Status: DC

## 2024-05-25 NOTE — Patient Instructions (Addendum)
 Lisinopril  Tablets What is this medication? LISINOPRIL  (lyse IN oh pril) treats high blood pressure and heart failure. It may also be used to prevent further damage after a heart attack. It works by relaxing blood vessels, which decreases the amount of work the heart has to do. It belongs to a group of medications called ACE inhibitors. This medicine may be used for other purposes; ask your health care provider or pharmacist if you have questions. COMMON BRAND NAME(S): Prinivil , Zestril  What should I tell my care team before I take this medication? They need to know if you have any of these conditions: Diabetes Heart or blood vessel disease History of swelling of the tongue, face, or lips with difficulty breathing, difficulty swallowing, hoarseness, or tightening of the throat (angioedema) Kidney disease Low blood pressure An unusual or allergic reaction to lisinopril , insect venom, other medications, foods, dyes, or preservatives Pregnant or trying to get pregnant Breastfeeding How should I use this medication? Take this medication by mouth. Take it as directed on the prescription label at the same time every day. You can take it with or without food. If it upsets your stomach, take it with food. Keep taking it unless your care team tells you to stop. Talk to your care team about the use of this medication in children. While it may be prescribed for children as young as 6 for selected conditions, precautions do apply. Overdosage: If you think you have taken too much of this medicine contact a poison control center or emergency room at once. NOTE: This medicine is only for you. Do not share this medicine with others. What if I miss a dose? If you miss a dose, take it as soon as you can. If it is almost time for your next dose, take only that dose. Do not take double or extra doses. What may interact with this medication? Do not take this medication with any of the following: Hymenoptera  venom Sacubitril; valsartan This medication may also interact with the following: Aliskiren Angiotensin receptor blockers, such as losartan or valsartan Certain medications for diabetes Diuretics Everolimus Gold compounds Lithium NSAIDs, medications for pain and inflammation, such as ibuprofen  or naproxen  Potassium salts or supplements Salt substitutes Sirolimus Temsirolimus This list may not describe all possible interactions. Give your health care provider a list of all the medicines, herbs, non-prescription drugs, or dietary supplements you use. Also tell them if you smoke, drink alcohol, or use illegal drugs. Some items may interact with your medicine. What should I watch for while using this medication? Visit your care team for regular checks on your progress. Check your blood pressure as directed. Know what your blood pressure should be and when to contact your care team. Do not treat yourself for coughs, colds, or pain while you are using this medication without asking your care team for advice. Some medications may increase your blood pressure. Talk to your care team if you may be pregnant. Serious birth defects can occur if you take this medication during pregnancy. This medication may affect your coordination, reaction time, or judgment. Do not drive or operate machinery until you know how this medication affects you. Sit up or stand slowly to reduce the risk of dizzy or fainting spells. Drinking alcohol with this medication can increase the risk of these side effects. Avoid salt substitutes unless you are told otherwise by your care team. What side effects may I notice from receiving this medication? Side effects that you should report to your care  team as soon as possible: Allergic reactions or angioedema--skin rash, itching, hives, swelling of the face, eyes, lips, tongue, arms, or legs, trouble swallowing or breathing High potassium level--muscle weakness, fast or irregular  heartbeat Kidney injury--decrease in the amount of urine, swelling of the ankles, hands, or feet Liver injury--right upper belly pain, loss of appetite, nausea, light-colored stool, dark yellow or brown urine, yellowing skin or eyes, unusual weakness, fatigue Low blood pressure--dizziness, feeling faint or lightheaded, blurry vision Side effects that usually do not require medical attention (report to your care team if they continue or are bothersome): Cough Dizziness Headache This list may not describe all possible side effects. Call your doctor for medical advice about side effects. You may report side effects to FDA at 1-800-FDA-1088. Where should I keep my medication? Keep out of the reach of children and pets. Store at room temperature between 20 and 25 degrees C (68 and 77 degrees F). Protect from moisture. Keep the container tightly closed. Do not freeze. Avoid exposure to extreme heat. Get rid of any unused medication after the expiration date. To get rid of medications that are no longer needed or have expired: Take the medication to a medication take-back program. Check with your pharmacy or law enforcement to find a location. If you cannot return the medication, check the label or package insert to see if the medication should be thrown out in the garbage or flushed down the toilet. If you are not sure, ask your care team. If it is safe to put in the trash, empty the medication out of the container. Mix the medication with cat litter, dirt, coffee grounds, or other unwanted substance. Seal the mixture in a bag or container. Put it in the trash. NOTE: This sheet is a summary. It may not cover all possible information. If you have questions about this medicine, talk to your doctor, pharmacist, or health care provider.  2024 Elsevier/Gold Standard (2022-05-17 00:00:00)  How to Take Your Blood Pressure Blood pressure is a measurement of how strongly your blood is pressing against the walls  of your arteries. Arteries are blood vessels that carry blood from your heart throughout your body. Your health care provider takes your blood pressure at each office visit. You can also take your own blood pressure at home with a blood pressure monitor. You may need to take your own blood pressure to: Confirm a diagnosis of high blood pressure (hypertension). Monitor your blood pressure over time. Make sure your blood pressure medicine is working. Supplies needed: Blood pressure monitor. A chair to sit in. This should be a chair where you can sit upright with your back supported. Do not sit on a soft couch or an armchair. Table or desk. Small notebook and pencil or pen. How to prepare To get the most accurate reading, avoid the following for 30 minutes before you check your blood pressure: Drinking caffeine . Drinking alcohol. Eating. Smoking. Exercising. Five minutes before you check your blood pressure: Use the bathroom and urinate so that you have an empty bladder. Sit quietly in a chair. Do not talk. How to take your blood pressure To check your blood pressure, follow the instructions in the manual that came with your blood pressure monitor. If you have a digital blood pressure monitor, the instructions may be as follows: Sit up straight in a chair. Place your feet on the floor. Do not cross your ankles or legs. Rest your left arm at the level of your heart on a  table or desk or on the arm of a chair. Pull up your shirt sleeve. Wrap the blood pressure cuff around the upper part of your left arm, 1 inch (2.5 cm) above your elbow. It is best to wrap the cuff around bare skin. Fit the cuff snugly, but not too tightly, around your arm. You should be able to place only one finger between the cuff and your arm. Position the cord so that it rests in the bend of your elbow. Press the power button. Sit quietly while the cuff inflates and deflates. Read the digital reading on the monitor  screen and write the numbers down (record them) in a notebook. Wait 2-3 minutes, then repeat the steps, starting at step 1. What does my blood pressure reading mean? A blood pressure reading consists of a higher number over a lower number. Ideally, your blood pressure should be below 120/80. The first (top) number is called the systolic pressure. It is a measure of the pressure in your arteries as your heart beats. The second (bottom) number is called the diastolic pressure. It is a measure of the pressure in your arteries as the heart relaxes. Blood pressure is classified into four stages. The following are the stages for adults who do not have a short-term serious illness or a chronic condition. Systolic pressure and diastolic pressure are measured in a unit called mm Hg (millimeters of mercury).  Normal Systolic pressure: below 120. Diastolic pressure: below 80. Elevated Systolic pressure: 120-129. Diastolic pressure: below 80. Hypertension stage 1 Systolic pressure: 130-139. Diastolic pressure: 80-89. Hypertension stage 2 Systolic pressure: 140 or above. Diastolic pressure: 90 or above. You can have elevated blood pressure or hypertension even if only the systolic or only the diastolic number in your reading is higher than normal. Follow these instructions at home: Medicines Take over-the-counter and prescription medicines only as told by your health care provider. Tell your health care provider if you are having any side effects from blood pressure medicine. General instructions Check your blood pressure as often as recommended by your health care provider. Check your blood pressure at the same time every day. Take your monitor to the next appointment with your health care provider to make sure that: You are using it correctly. It provides accurate readings. Understand what your goal blood pressure numbers are. Keep all follow-up visits. This is important. General tips Your  health care provider can suggest a reliable monitor that will meet your needs. There are several types of home blood pressure monitors. Choose a monitor that has an arm cuff. Do not choose a monitor that measures your blood pressure from your wrist or finger. Choose a cuff that wraps snugly, not too tight or too loose, around your upper arm. You should be able to fit only one finger between your arm and the cuff. You can buy a blood pressure monitor at most drugstores or online. Where to find more information American Heart Association: www.heart.org Contact a health care provider if: Your blood pressure is consistently high. Your blood pressure is suddenly low. Get help right away if: Your systolic blood pressure is higher than 180. Your diastolic blood pressure is higher than 120. These symptoms may be an emergency. Get help right away. Call 911. Do not wait to see if the symptoms will go away. Do not drive yourself to the hospital. Summary Blood pressure is a measurement of how strongly your blood is pressing against the walls of your arteries. A blood pressure reading  consists of a higher number over a lower number. Ideally, your blood pressure should be below 120/80. Check your blood pressure at the same time every day. Avoid caffeine , alcohol, smoking, and exercise for 30 minutes prior to checking your blood pressure. These agents can affect the accuracy of the blood pressure reading. This information is not intended to replace advice given to you by your health care provider. Make sure you discuss any questions you have with your health care provider. Document Revised: 07/05/2021 Document Reviewed: 07/05/2021 Elsevier Patient Education  2024 [employer].

## 2024-05-25 NOTE — Progress Notes (Signed)
 Established Patient Office Visit  Subjective    Patient ID: Anna Tapia, female    DOB: [birth year]  Age: 33 y.o. MRN: [id number]  CC:  Chief Complaint  Patient presents with   Hypertension    Seen in ER    HPI Anna Tapia presents for ER follow up for high BP. She is here with her mother.   Discharge Date: 05/20/24 Hospital/facility: [facility] Diagnosis: headache and elevated blood pressure Procedures/tests: Labs normal, urine pregnancy negative. 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 No large vessel occulusion or stenosis.  New medications: None - given IVF and tylenol , Reglan   Discharge instructions:  Follow up with PCP Status: fluctuating  Since the ER visit, her blood pressure has been ranging 155-157/95-97 at home. Her headache resolved yesterday. No chest pain, SOB, dizziness, etc.   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 Also on Lyrica 50 mg BID  -Difficulty using using right hand and right side of face - completed home PT/OT but interested in continuing with therapy outside the home.  Lipid Panel     Component Value Date/Time   CHOL 202 (H) 04/02/2024 0945   TRIG 56 04/02/2024 0945   HDL 68 04/02/2024 0945   CHOLHDL 3.0 04/02/2024 0945   LDLCALC 119 (H) 04/02/2024 0945   Hx of CHF as a newborn: -Had been following with Cardiology at [facility], last seen in 2018 -Last echo 9/23 EF 58% -Denies chest pain, palpitations,  shortness of breath or lower extremity swelling   Outpatient Encounter Medications as of 05/25/2024  Medication Sig   albuterol  (VENTOLIN  HFA) 108 (90 Base) MCG/ACT inhaler Inhale 2 puffs into the lungs every 6 (six) hours as needed for wheezing or shortness of breath.   baclofen  (LIORESAL ) 10 MG tablet Take 5 mg in the morning, 10 mg at lunch and 20 mg at night.   DULoxetine  (CYMBALTA ) 20 MG capsule Take 2 capsules by mouth once daily   famotidine  (PEPCID ) 20 MG tablet Take 1 tablet (20 mg total) by mouth 2 (two) times daily.   fluticasone -salmeterol (ADVAIR) 100-50 MCG/ACT AEPB Inhale 1 puff into the lungs 2 (two) times daily.   pregabalin (LYRICA) 50 MG capsule Take 50 mg by mouth 2 (two) times daily.   acetaminophen  (TYLENOL ) 325 MG tablet Take 2 tablets (650 mg total) by mouth every 6 (six) hours as needed for moderate pain (pain score 4-6).   Blood Pressure Monitoring (ADULT BLOOD PRESSURE CUFF LG) KIT 1 each by Does not apply route daily.   dibucaine (NUPERCAINAL) 1 % OINT Place 1 application  rectally 3 (three) times daily as needed for hemorrhoids. (Patient not taking: Reported on 04/02/2024)   No facility-administered encounter medications on file as of 05/25/2024.    Past Medical History:  Diagnosis Date   Arteriovenous malformation of brain    a. s/p remote coiling.   Asthma    Brain aneurysm    a. PCA and ACA aneurysm s/p  Onyx embolization.   Congenital CHF (congestive heart failure) (HCC)    a. 03/2023 Echo: EF 60-65%, no rwma, nl RV fxn, RVSP 29.33mmHg. Mild MR. Mild Ao sclerosis w/o stenosis. Ao root 38mm.   COVID 2022   Hemorrhagic stroke (HCC) 2023   a. L-sided midbrain, thalamic ICH in setting of PCA/ACA aneurysm s/p embolization.   History of broken collarbone 2020   Hydrocephalus (HCC)    a. s/p VP shunt in childhood.   Precordial chest pain    a. 03/2023 MV: No ischemia or infarct.  EF greater than 65%.  No significant coronary calcification.   Sepsis secondary to  [facility]) 2021    Past Surgical History:  Procedure Laterality Date   VENTRICULOPERITONEAL SHUNT      Family History  Problem Relation Age of Onset   Seizures Mother    Hypertension Mother    Diabetes Mother    Cancer Maternal Aunt    Cancer Maternal Uncle     Social History   Socioeconomic History   Marital status: Single    Spouse name: Not on file   Number of children: Not on file   Years of education: Not on file   Highest education level: GED or equivalent  Occupational History   Not on file  Tobacco Use   Smoking status: Former    Current packs/day: 0.00    Types: E-cigarettes, Cigarettes    Start date: 2007    Quit date: 2021    Years since quitting: 4.5    Passive exposure: Past   Smokeless tobacco: Never  Vaping Use   Vaping status: Former  Substance and Sexual Activity   Alcohol use: Not Currently   Drug use: Never   Sexual activity: Not Currently    Partners: Male    Birth control/protection: Abstinence  Other Topics Concern   Not on file  Social History Narrative   Not on file   Social Drivers of Health   Financial Resource Strain: Medium Risk (04/02/2024)   Overall Financial Resource Strain (CARDIA)    Difficulty of Paying Living Expenses: Somewhat hard  Food Insecurity: No Food Insecurity (04/02/2024)   Hunger Vital Sign    Worried About Running Out of Food in the Last Year: Never true    Ran Out of Food in the Last Year: Never true  Transportation Needs: No Transportation Needs (04/02/2024)   PRAPARE - Administrator, Civil Service (Medical): No    Lack of Transportation (Non-Medical): No  Physical Activity: Insufficiently Active (04/02/2024)   Exercise Vital Sign    Days of Exercise per Week: 3 days    Minutes of Exercise per Session: 10 min  Stress: No Stress Concern Present (04/02/2024)   [employer] of Occupational Health - Occupational Stress Questionnaire    Feeling of Stress : Only a little  Social Connections:  Moderately Isolated (04/02/2024)   Social Connection and Isolation Panel    Frequency of Communication with Friends and Family: More than three times a week    Frequency of Social Gatherings with Friends and Family: More than three times a week    Attends Religious Services: 1 to 4 times per year    Active Member of [employer] or Organizations: No    Attends Banker Meetings: Never    Marital Status: Never married  Intimate Partner Violence: Not At Risk (04/02/2024)   Humiliation, Afraid, Rape, and Kick questionnaire    Fear of Current or Ex-Partner: No  Emotionally Abused: No    Physically Abused: No    Sexually Abused: No    Review of Systems  Constitutional:  Negative for chills and fever.  Eyes:  Negative for blurred vision.  Respiratory:  Negative for shortness of breath.   Cardiovascular:  Negative for chest pain.  Neurological:  Negative for dizziness and headaches.        Objective    BP (!) 140/86 (Cuff Size: Large)   Pulse 100   Temp 98.7 F (37.1 C) (Oral)   Resp 16   Ht 5' (1.524 m)   Wt 176 lb 14.4 oz (80.2 kg)   SpO2 97%   BMI 34.55 kg/m   Physical Exam Constitutional:      Appearance: Normal appearance.  HENT:     Head: Normocephalic and atraumatic.  Eyes:     Conjunctiva/sclera: Conjunctivae normal.  Cardiovascular:     Rate and Rhythm: Normal rate and regular rhythm.  Pulmonary:     Effort: Pulmonary effort is normal.     Breath sounds: Normal breath sounds.  Skin:    General: Skin is warm and dry.  Neurological:     Mental Status: She is alert. Mental status is at baseline.     Comments: Tremor in thumb and 2-4th digits in right hand  Psychiatric:        Mood and Affect: Mood normal.        Behavior: Behavior normal.         Assessment & Plan:   1. Hypertension, unspecified type (Primary): Blood pressure has now been elevated in 3 different clinical settings, will start treatment with Lisinopril  5 mg and continue to monitor  BP at home. Discussed potential side effects of Lisinopril  with the patient. Follow up in 1 month to recheck.   - Blood Pressure Monitoring (ADULT BLOOD PRESSURE CUFF LG) KIT; 1 each by Does not apply route daily.  Dispense: 1 kit; Refill: 0 - lisinopril  (ZESTRIL ) 5 MG tablet; Take 1 tablet (5 mg total) by mouth daily.  Dispense: 30 tablet; Refill: 1   Return in about 4 weeks (around 06/22/2024).   Sharyle Fischer, DO

## 2024-05-26 DIAGNOSIS — Z7689 Persons encountering health services in other specified circumstances: Secondary | ICD-10-CM | POA: Diagnosis not present

## 2024-05-26 DIAGNOSIS — I69351 Hemiplegia and hemiparesis following cerebral infarction affecting right dominant side: Secondary | ICD-10-CM | POA: Diagnosis not present

## 2024-05-27 ENCOUNTER — Ambulatory Visit: Payer: Medicaid Other | Admitting: Physical Therapy

## 2024-05-27 ENCOUNTER — Ambulatory Visit: Payer: Medicaid Other

## 2024-05-27 ENCOUNTER — Ambulatory Visit: Admitting: Physical Therapy

## 2024-05-27 DIAGNOSIS — Z7689 Persons encountering health services in other specified circumstances: Secondary | ICD-10-CM | POA: Diagnosis not present

## 2024-05-27 DIAGNOSIS — I6930 Unspecified sequelae of cerebral infarction: Secondary | ICD-10-CM | POA: Diagnosis not present

## 2024-05-27 DIAGNOSIS — R262 Difficulty in walking, not elsewhere classified: Secondary | ICD-10-CM | POA: Diagnosis not present

## 2024-05-27 DIAGNOSIS — R2689 Other abnormalities of gait and mobility: Secondary | ICD-10-CM | POA: Diagnosis not present

## 2024-05-27 DIAGNOSIS — M6281 Muscle weakness (generalized): Secondary | ICD-10-CM

## 2024-05-27 DIAGNOSIS — R2681 Unsteadiness on feet: Secondary | ICD-10-CM

## 2024-05-27 DIAGNOSIS — R278 Other lack of coordination: Secondary | ICD-10-CM | POA: Diagnosis not present

## 2024-05-27 NOTE — Therapy (Signed)
 OUTPATIENT PHYSICAL THERAPY NEURO TREATMENT/  Re-certification    Patient Name: Anna Tapia MRN: [id number] DOB:07-20-1991, 33 y.o., female Today's Date: 05/27/2024   PCP: Bernardo Fend, DO  REFERRING PROVIDER: Bernardo Fend, DO   END OF SESSION:  PT End of Session - 05/27/24 1453     Visit Number 9    Number of Visits 24    Date for PT Re-Evaluation 08/19/24    PT Start Time 1450    PT Stop Time 1530    PT Time Calculation (min) 40 min    Equipment Utilized During Treatment Gait belt    Activity Tolerance Patient tolerated treatment well;No increased pain    Behavior During Therapy WFL for tasks assessed/performed          Past Medical History:  Diagnosis Date   Arteriovenous malformation of brain    a. s/p remote coiling.   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   Congenital CHF (congestive heart failure) (HCC)    a. 03/2023 Echo: EF 60-65%, no rwma, nl RV fxn, RVSP 29.36mmHg. Mild MR. Mild Ao sclerosis w/o stenosis. Ao root 38mm.   COVID 2022   Hemorrhagic stroke (HCC) 2023   a. L-sided midbrain, thalamic ICH in setting of PCA/ACA aneurysm s/p embolization.   History of broken collarbone 2020   Hydrocephalus (HCC)    a. s/p VP shunt in childhood.   Precordial chest pain    a. 03/2023 MV: No ischemia or infarct.  EF greater than 65%.  No significant coronary calcification.   Sepsis secondary to UTI (HCC) 2021   Past Surgical History:  Procedure Laterality Date   VENTRICULOPERITONEAL SHUNT     Patient Active Problem List   Diagnosis Date Noted   History of CVA with residual deficit 11/25/2023   CHF (congestive heart failure) (HCC) 03/25/2023   Left-sided nontraumatic intracerebral hemorrhage (HCC) 08/01/2022   AVM (arteriovenous malformation) brain 07/24/2022   Asthma 07/24/2022   Major depressive disorder, recurrent episode, moderate (HCC) 02/07/2022   Sepsis secondary to UTI (HCC) 05/01/2020   VP  (ventriculoperitoneal) shunt status 05/01/2020   Right ovarian cyst 05/01/2020   Elevated LFTs 05/01/2020    ONSET DATE: September  2023  REFERRING DIAG:  Diagnosis  I69.30 (ICD-10-CM) - History of CVA with residual deficit    THERAPY DIAG:  Muscle weakness (generalized)  Other lack of coordination  History of CVA with residual deficit  Difficulty in walking, not elsewhere classified  Other abnormalities of gait and mobility  Balance disorder  Unsteadiness on feet  Rationale for Evaluation and Treatment: Rehabilitation  SUBJECTIVE:  SUBJECTIVE STATEMENT:  Pt returns to PT after 2 month hiatus due to initial denial from insurance for PT. Has not been approved for 12 additional Visits.   Reports that overall she is doing better than last week. She was experiencing severe HA with elevated BP. Was assessed at ED and BP medication has been adjusted:   Is glad to return to PT, stating that she really feels like she needs it to get better at walking    Pt accompanied by: self  PERTINENT HISTORY:  From recent MD visit:  [name] presents to follow up on chronic medical conditions. Having some knee pain today, had been doing well with PT but her insurance is no longer paying for this until the new year.    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  Also on Lyrica 50 mg BID  -Difficulty using using right hand and right side of face - completed  home PT/OT but interested in continuing with therapy outside the home.  Hx of CHF as a newborn: -Had been following with Cardiology at [facility], last seen in 2018 -Last echo 9/23 EF 58% -Denies chest pain, palpitations, shortness of breath or lower extremity swelling  PAIN:  Are you having pain? Yes: NPRS scale: 4/10 Pain location: R shoulder  Pain description: dull pain  Aggravating factors: moving arm  Relieving factors: n/a   PRECAUTIONS: Fall  RED FLAGS: None   WEIGHT BEARING RESTRICTIONS: No  FALLS: Has patient fallen in last 6 months? No  LIVING ENVIRONMENT: Lives with: lives with their family Lives in: House/apartment Stairs: Yes: External: 2 steps; on right going up and on left going up Has following equipment at home: Single point cane  PLOF: Independent, Independent with basic ADLs, and prior to CVA   PATIENT GOALS: improve walking. Get around better   OBJECTIVE:  Note: Objective measures were completed at Evaluation unless otherwise noted.  DIAGNOSTIC FINDINGS:   CT 2023 IMPRESSION: 1. 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 Again, prominent arterial contribution to the AVM is seen from the left ACA and posterior circulation, with primary venous drainage into the deep venous system. Associated 7 mm aneurysm along the right anterolateral aspect of the AVM as above. 2. Diffuse tortuosity and ectasia elsewhere about the major arterial vasculature of the head and neck. No large vessel occlusion or hemodynamically significant stenosis. No other acute vascular abnormality.    COGNITION: Overall cognitive status: Within functional limits for tasks assessed   SENSATION: Light touch: Impaired  tight feeling on the R foot due to swelling   COORDINATION: Spastic hemiplegia on the R side.   EDEMA:  Circumferential:  distal to knee in sitting R: 41.6CM L 39cm  MUSCLE TONE: RLE: Mild noted increased tone  with gait and mobility.   DTRs:  Patella 1 = Trace   POSTURE: rounded shoulders, forward head, and left pelvic obliquity  LOWER EXTREMITY ROM:     Active  Right Eval Left Eval  Hip flexion [facility]  Hip extension [facility]  Hip abduction    Hip adduction  Hip internal rotation    Hip external rotation    Knee flexion 110 WFL  Knee extension [facility]  Ankle dorsiflexion 8 15  Ankle plantarflexion [facility]  Ankle inversion    Ankle eversion     (Blank rows = not tested)  LOWER EXTREMITY MMT:    03/25/24 MMT Right Eval Left Eval  Hip flexion 4+ 4+  Hip extension    Hip abduction 5 5  Hip adduction 5 5  Hip internal rotation    Hip external rotation    Knee flexion 4+ 5  Knee extension 5 5  Ankle dorsiflexion 4 5  Ankle plantarflexion    Ankle inversion    Ankle eversion    (Blank rows = not tested)  BED MOBILITY:  Sit to supine SBA Supine to sit SBA Rolling to Right SBA Rolling to Left SBA  TRANSFERS: Assistive device utilized: None  Sit to stand: SBA Stand to sit: SBA Chair to chair: SBA Floor: need to assess    CURB:  Level of Assistance: Modified independence and SBA Assistive device utilized: rail  Curb Comments: step to descent   STAIRS: Level of Assistance: Modified independence Stair Negotiation Technique: Step to Pattern with descent and Single Rail on Left Number of Stairs: 4  Height of Stairs: 6  Comments: mild posterior bias on descent. And step to pattern   GAIT: Gait pattern: step through pattern, decreased stance time- Right, Right hip hike, trunk rotated posterior- Left, wide BOS, and poor foot clearance- Right Distance walked: 60 Assistive device utilized: None Level of assistance: Modified independence and SBA Comments: see description  FUNCTIONAL TESTS:  See Goal assessment.   PATIENT SURVEYS:  ABC scale 48.75  See goals assessment                                                                                                                               TREATMENT DATE: 01/29/2024 VS assessment Sitting : 151/96(110) HR 84 Standing:  147/100(112) HR 91  Standing 2 min : 150/101(115) HR 87    PT instructed pt in goal assessment to measure progress towards LTG.   Pt performed 5 time sit<>stand (5xSTS): 14.34 sec  without UE support (13.06sec,10.76 sec;>15 sec indicates increased fall risk)   PT instructed pt in TUG: 12.86 sec without AD (average of 2 trials; >13.5 sec indicates increased fall risk)  6 Min Walk Test:  Instructed patient to ambulate as quickly and as safely as possible for 6 minutes using LRAD. Patient was allowed to take standing rest breaks without stopping the test, but if the patient required a sitting rest break the clock would be stopped and the test would be over.  Results: 1068 no AD, no rest breaks. Results indicate that the patient has reduced endurance with ambulation compared to age matched norms.  Age Matched Norms: 78-69 yo M: 2 F: 45, 82-79 yo M: 73 F: 471, 52-89 yo M: 34 F:  392 MDC: 58.21 meters (190.98 feet) or 50 meters (ANPTA Core Set of Outcome Measures for Adults with Neurologic Conditions, 2018) VS assessed upon completion: 141/92(106) : HR 93  10 Meter Walk Test: Patient instructed to walk 10 meters (32.8 ft) as quickly and as safely as possible at their normal speed x2 and at a fast speed x2. Time measured from 2 meter mark to 8 meter mark to accommodate ramp-up and ramp-down.  Average Normal speed: 0.49m/s no AD   Cut off scores: <0.4 m/s = household Ambulator, 0.4-0.8 m/s = limited community Ambulator, >0.8 m/s = community Ambulator, >1.2 m/s = crossing a street, <1.0 = increased fall risk MCID 0.05 m/s (small), 0.13 m/s (moderate), 0.06 m/s (significant)  (ANPTA Core Set of Outcome Measures for Adults with Neurologic Conditions, 2018)   PATIENT EDUCATION: Education details: POC.  Person educated: Patient Education method: Software engineer Education comprehension: verbalized understanding  HOME EXERCISE PROGRAM: Access Code: [email or web address] URL: [email or web address] Date: 02/19/2024 Prepared by: Massie Dollar  Exercises - Seated March with Ankle Weights at Foot  - 1 x daily - 7 x weekly - 3 sets - 10 reps - Side Stepping with Resistance at Feet  - 1 x daily - 7 x weekly - 3 sets - 10 reps - Tandem Stance  - 1 x daily - 5 x weekly - 3 sets - 4 reps - 20 hold - Staggered Sit-to-Stand  - 1 x daily - 5 x weekly - 2 sets - 5 reps - Sit to Stand with Arms Crossed  - 1 x daily - 5 x weekly - 3 sets - 10 reps - Seated Knee Extension with Resistance  - 1 x daily - 7 x weekly - 3 sets - 10 reps - Seated Hip Abduction with Resistance  - 1 x daily - 7 x weekly - 3 sets - 10 reps - Standing Single Leg Stance with Counter Support  - 1 x daily - 7 x weekly - 3 sets - 10 reps  GOALS: Goals reviewed with patient? Yes  SHORT TERM GOALS: Target date: 07/01/2024   Patient will be independent in home exercise program to improve strength/mobility for better functional independence with ADLs. Baseline: to be given on visit 2  5/:22: HEP provided and reviewed since 4/17 7/24: reports that she has not complete for ~ 1 MONTH - reprinted on this day  .    Goal status: IN PROGRESS  LONG TERM GOALS: Target date: 08/19/2024    Patient will increase ABC score to equal to or greater than   10%  to demonstrate statistically significant improvement in mobility and quality of life.  Baseline: 48.75 5/22: 62.5% 7/24: 56.25%  Goal status: IN PROGRESS  2.  Patient (> 45 years old) will complete five times sit to stand test in < 11 seconds indicating an increased LE strength and improved balance. Baseline: 12.41sec 5/22: 11.91sec without UE support average of 2 trials. 7/24: 14.34 sec  Goal status: IN PROGRESS  3.  Patient will increase 6 min walk test to >128ft to demonstrate decreased fall risk during functional  activities with community mobility/  Baseline: 9100ft without AD. No rest break needed.  5/22: 923ft without AD no rest break required.  7/24: 1068 no AD> no rest breaks  Goal status: IN PROGRESS  4.  Patient will increase 10 meter walk test to >1.35m/s as to improve gait speed for better community ambulation and to reduce fall risk. Baseline: 0.94m/s 5/22:Average Normal speed:  0.87 m/s; Average Fast speed: 1.03 m/s 7/24: 0.106m/s no AD  Goal status: IN PROGRESS  5.  Patient will reduce timed up and go to <10 seconds to reduce fall risk and demonstrate improved transfer/gait ability. Baseline: 10.94 5/22: 9.08 sec without UE support  7/24: 12.86 sec without AD   Goal status: IN PROGRESS  6.  Patient will increase dynamic gait index score to >24 as to demonstrate reduced fall risk and improved dynamic gait balance for better safety with community/home ambulation.   Baseline: 20 Goal status: IN PROGRESS   ASSESSMENT:  CLINICAL IMPRESSION:  Pt put forth good effort throughout the session with re-assessment of LTG following 2 month break due to insurance denial. Pt demonstrates mild regression in function and balance noted with increased time on 5x STS, increased time on Tug, decreased gait speed, and reduced confidence with mobility/ADL tasks noted in ABC. Pt was able to demonstrate increased distance on walk test without standing rest break. Goals were adjusted as necessary.  Pt will continue to benefit from skilled therapy to address remaining deficits in order to improve overall QoL and return to PLOF.     OBJECTIVE IMPAIRMENTS: Abnormal gait, cardiopulmonary status limiting activity, decreased activity tolerance, decreased balance, decreased coordination, decreased endurance, decreased knowledge of use of DME, decreased mobility, difficulty walking, decreased ROM, decreased strength, hypomobility, increased edema, increased fascial restrictions, increased muscle spasms, impaired  flexibility, impaired sensation, impaired tone, impaired UE functional use, improper body mechanics, postural dysfunction, and pain.   ACTIVITY LIMITATIONS: carrying, lifting, bending, squatting, stairs, transfers, bed mobility, dressing, and locomotion level  PARTICIPATION LIMITATIONS: cleaning, laundry, interpersonal relationship, driving, shopping, community activity, occupation, and yard work  PERSONAL FACTORS: 1-2 comorbidities: hs of CHF and CVA are also affecting patient's functional outcome.   REHAB POTENTIAL: Good  CLINICAL DECISION MAKING: Evolving/moderate complexity  EVALUATION COMPLEXITY: Moderate  PLAN:  PT FREQUENCY: 1-2x/week  PT DURATION: 12 weeks  PLANNED INTERVENTIONS: 97110-Therapeutic exercises, 97530- Therapeutic activity, 97112- Neuromuscular re-education, 97535- Self Care, [id number]- Manual therapy, [phone]- Gait training, [phone]- Orthotic Fit/training, [phone]- Splinting, [phone]- Electrical stimulation (unattended), [phone]- Electrical stimulation (manual), Patient/Family education, Balance training, Stair training, Taping, Dry Needling, Joint mobilization, Joint manipulation, Spinal manipulation, Spinal mobilization, Vestibular training, Visual/preceptual remediation/compensation, DME instructions, Cryotherapy, and Moist heat  PLAN FOR NEXT SESSION:   Continue Dynamic balance training.  R ankle and gluteal strengthening  HEP review   Massie Dollar PT, DPT  Physical Therapist - [facility]  [facility]  2:56 PM 05/27/24

## 2024-05-31 NOTE — Telephone Encounter (Signed)
 FYI She is needing a lab for food stamps

## 2024-05-31 NOTE — Telephone Encounter (Unsigned)
 Copied from CRM [id number]. Topic: General - Other >> May 28, 2024  4:49 PM Wess RAMAN wrote: Reason for CRM: Patient is request Bernardo Fend, DO to fax over a letter to her food stamp case worker stating she is unable to work.  Fax # : [phone] Phone #: [phone] Patient callback #: [phone]

## 2024-06-01 ENCOUNTER — Other Ambulatory Visit: Payer: Self-pay | Admitting: Internal Medicine

## 2024-06-01 NOTE — Telephone Encounter (Signed)
 Letter faxed.

## 2024-06-03 ENCOUNTER — Ambulatory Visit

## 2024-06-03 ENCOUNTER — Ambulatory Visit: Admitting: Physical Therapy

## 2024-06-03 DIAGNOSIS — R2689 Other abnormalities of gait and mobility: Secondary | ICD-10-CM

## 2024-06-03 DIAGNOSIS — I6930 Unspecified sequelae of cerebral infarction: Secondary | ICD-10-CM | POA: Diagnosis not present

## 2024-06-03 DIAGNOSIS — Z7689 Persons encountering health services in other specified circumstances: Secondary | ICD-10-CM | POA: Diagnosis not present

## 2024-06-03 DIAGNOSIS — R262 Difficulty in walking, not elsewhere classified: Secondary | ICD-10-CM

## 2024-06-03 DIAGNOSIS — R2681 Unsteadiness on feet: Secondary | ICD-10-CM

## 2024-06-03 DIAGNOSIS — M6281 Muscle weakness (generalized): Secondary | ICD-10-CM

## 2024-06-03 DIAGNOSIS — R278 Other lack of coordination: Secondary | ICD-10-CM | POA: Diagnosis not present

## 2024-06-03 NOTE — Therapy (Signed)
 OUTPATIENT PHYSICAL THERAPY NEURO TREATMENT/Physical Therapy Progress Note   Dates of reporting period  01/29/2024   to   06/03/2024   Patient Name: Anna Tapia MRN: [id number] DOB:July 13, 1991, 33 y.o., female Today's Date: 06/03/2024   PCP: Bernardo Fend, DO  REFERRING PROVIDER: Bernardo Fend, DO   END OF SESSION:  PT End of Session - 06/03/24 1454     Visit Number 10    Number of Visits 24    Date for PT Re-Evaluation 08/19/24    PT Start Time 1454    PT Stop Time 1529    PT Time Calculation (min) 35 min    Equipment Utilized During Treatment Gait belt    Activity Tolerance Patient tolerated treatment well;No increased pain    Behavior During Therapy WFL for tasks assessed/performed           Past Medical History:  Diagnosis Date   Arteriovenous malformation of brain    a. s/p remote coiling.   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   Congenital CHF (congestive heart failure) (HCC)    a. 03/2023 Echo: EF 60-65%, no rwma, nl RV fxn, RVSP 29.65mmHg. Mild MR. Mild Ao sclerosis w/o stenosis. Ao root 38mm.   COVID 2022   Hemorrhagic stroke (HCC) 2023   a. L-sided midbrain, thalamic ICH in setting of PCA/ACA aneurysm s/p embolization.   History of broken collarbone 2020   Hydrocephalus (HCC)    a. s/p VP shunt in childhood.   Precordial chest pain    a. 03/2023 MV: No ischemia or infarct.  EF greater than 65%.  No significant coronary calcification.   Sepsis secondary to UTI (HCC) 2021   Past Surgical History:  Procedure Laterality Date   VENTRICULOPERITONEAL SHUNT     Patient Active Problem List   Diagnosis Date Noted   History of CVA with residual deficit 11/25/2023   CHF (congestive heart failure) (HCC) 03/25/2023   Left-sided nontraumatic intracerebral hemorrhage (HCC) 08/01/2022   AVM (arteriovenous malformation) brain 07/24/2022   Asthma 07/24/2022   Major depressive disorder, recurrent episode, moderate  (HCC) 02/07/2022   Sepsis secondary to UTI (HCC) 05/01/2020   VP (ventriculoperitoneal) shunt status 05/01/2020   Right ovarian cyst 05/01/2020   Elevated LFTs 05/01/2020    ONSET DATE: September  2023  REFERRING DIAG:  Diagnosis  I69.30 (ICD-10-CM) - History of CVA with residual deficit    THERAPY DIAG:  Muscle weakness (generalized)  Other lack of coordination  History of CVA with residual deficit  Difficulty in walking, not elsewhere classified  Other abnormalities of gait and mobility  Balance disorder  Unsteadiness on feet  Rationale for Evaluation and Treatment: Rehabilitation  SUBJECTIVE:  SUBJECTIVE STATEMENT:  Patient reports doing well overall today- just tired but no pain.    Pt accompanied by: self  PERTINENT HISTORY:  From recent MD visit:  [name] presents to follow up on chronic medical conditions. Having some knee pain today, had been doing well with PT but her insurance is no longer paying for this until the new year.    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  Also on Lyrica 50 mg BID  -Difficulty using using right hand and right side of face - completed home PT/OT but interested in continuing with therapy outside the home.  Hx of CHF as a newborn: -Had been following with Cardiology at [facility], last seen in 2018 -Last echo 9/23 EF 58% -Denies chest pain, palpitations, shortness of breath or lower extremity  swelling  PAIN:  Are you having pain? Yes: NPRS scale: 4/10 Pain location: R shoulder  Pain description: dull pain  Aggravating factors: moving arm  Relieving factors: n/a   PRECAUTIONS: Fall  RED FLAGS: None   WEIGHT BEARING RESTRICTIONS: No  FALLS: Has patient fallen in last 6 months? No  LIVING ENVIRONMENT: Lives with: lives with their family Lives in: House/apartment Stairs: Yes: External: 2 steps; on right going up and on left going up Has following equipment at home: Single point cane  PLOF: Independent, Independent with basic ADLs, and prior to CVA   PATIENT GOALS: improve walking. Get around better   OBJECTIVE:  Note: Objective measures were completed at Evaluation unless otherwise noted.  DIAGNOSTIC FINDINGS:   CT 2023 IMPRESSION: 1. 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 Again, prominent arterial contribution to the AVM is seen from the left ACA and posterior circulation, with primary venous drainage into the deep venous system. Associated 7 mm aneurysm along the right anterolateral aspect of the AVM as above. 2. Diffuse tortuosity and ectasia elsewhere about the major arterial vasculature of the head and neck. No large vessel occlusion or hemodynamically significant stenosis. No other acute vascular abnormality.    COGNITION: Overall cognitive status: Within functional limits for tasks assessed   SENSATION: Light touch: Impaired  tight feeling on the R foot due to swelling   COORDINATION: Spastic hemiplegia on the R side.   EDEMA:  Circumferential:  distal to knee in sitting R: 41.6CM L 39cm  MUSCLE TONE: RLE: Mild noted increased tone with gait and mobility.   DTRs:  Patella 1 = Trace   POSTURE: rounded shoulders, forward head, and left pelvic obliquity  LOWER EXTREMITY ROM:     Active  Right Eval Left Eval  Hip flexion [facility]  Hip extension [facility]  Hip abduction    Hip  adduction    Hip internal rotation    Hip external rotation    Knee flexion 110 WFL  Knee extension [facility]  Ankle dorsiflexion 8 15  Ankle plantarflexion [facility]  Ankle inversion    Ankle eversion     (Blank rows = not tested)  LOWER EXTREMITY MMT:    03/25/24 MMT Right Eval Left Eval  Hip flexion 4+  4+  Hip extension    Hip abduction 5 5  Hip adduction 5 5  Hip internal rotation    Hip external rotation    Knee flexion 4+ 5  Knee extension 5 5  Ankle dorsiflexion 4 5  Ankle plantarflexion    Ankle inversion    Ankle eversion    (Blank rows = not tested)  BED MOBILITY:  Sit to supine SBA Supine to sit SBA Rolling to Right SBA Rolling to Left SBA  TRANSFERS: Assistive device utilized: None  Sit to stand: SBA Stand to sit: SBA Chair to chair: SBA Floor: need to assess    CURB:  Level of Assistance: Modified independence and SBA Assistive device utilized: rail  Curb Comments: step to descent   STAIRS: Level of Assistance: Modified independence Stair Negotiation Technique: Step to Pattern with descent and Single Rail on Left Number of Stairs: 4  Height of Stairs: 6  Comments: mild posterior bias on descent. And step to pattern   GAIT: Gait pattern: step through pattern, decreased stance time- Right, Right hip hike, trunk rotated posterior- Left, wide BOS, and poor foot clearance- Right Distance walked: 60 Assistive device utilized: None Level of assistance: Modified independence and SBA Comments: see description  FUNCTIONAL TESTS:  See Goal assessment.   PATIENT SURVEYS:  ABC scale 48.75  See goals assessment                                                                                                                              TREATMENT DATE: 06/03/2024   Self care/Home management VS assessment Sitting : 125/88 (99) HR 88 Standing:  129/195 HR 91    Dynamic Gait Index  Mark the lowest level that applies.   Date Performed 06/03/2024   Gait level surface (3) Normal: walks 20', no AD, good speed, no evidence for imbalance, normal gait pattern  2. Change in gait speed (3) Normal: Able to smoothly change walking speed without loss of balance or gait deviation. Shows a significant difference in walking speeds between normal, fast and slow speeds  3. Gait with horizontal head turns (2) Mild Impairment: Performs head turns smoothly with slight change in gait velocity, i.e., minor disruption to smooth gait path or uses walking aid  4. Gait with vertical head turns (2) Mild Impairment: Performs head turns smoothly with slight change in gait velocity, i.e., minor disruption to smooth gait path or uses walking aid  5. Gait and pivot turn (3) Normal: Pivot turns safely within 3 seconds and stops quickly with no loss of balance  6. Step over obstacle (2) Mild Impairment: Is able to step over box, but must slow down and adjust steps to clear box safely  7. Step around obstacle (3) Normal: Is able to walk around cones safely without changing gait speed; no evidence of imbalance  8. Steps (2) Mild Impairment: Alternating feet, must use rail  Total score 20    Score  Interpretation: Score of <19 indicates high risk of falls.  Minimally Clinically Important Difference (MCID):  =DGI scores of<21/24 = 1.80 points DGI scores of >21/24 = 0.60 points   Mansfield T, Inbar-Borovsky N, Brozgol M, Giladi N, [place] JM. The Dynamic Gait Index in healthy older adults: the role of stair climbing, fear of falling and gender. Gait Posture. 2009 Feb;29(2):237-41. doi: 10.1016/j.gaitpost.2008.08.013. Epub 2008 Oct 8. PMID: 81154560; PMCID: EFR7290501.  Pardasaney, MYRTIS LOIS Bonus, GEANNIE POUR., et al. (2012). Sensitivity to change and responsiveness of four balance measures for community-dwelling older adults. Physical therapy 92(3): 388-397.    FUNCTIONAL GAIT ASSESSMENT  Date: 06/03/2024 Score  GAIT LEVEL SURFACE Instructions: Walk at your normal speed from  here to the next mark (6 m) [20 ft]. (3) Normal - Walks 6 m (20 ft) in less than 5.5 seconds, no assistive devices, good speed, no evidence for imbalance, normal gait pattern, deviates no more than 15.24 cm (6 in) outside of the 30.48-cm (12-in) walkway width.  2.   CHANGE IN GAIT SPEED Instructions: Begin walking at your normal pace (for 1.5 m [5 ft]). When I tell you "go," walk as fast as you can (for 1.5 m [5 ft]). When I tell you "slow," walk as slowly as you can (for 1.5 m [5 ft]. (3) Normal - Able to smoothly change walking speed without loss of balance or gait deviation. Shows a significant difference in walking speeds between normal, fast, and slow speeds. Deviates no more than 15.24 cm (6 in) outside of the 30.48-cm (12-in) walkway width.  3.    GAIT WITH HORIZONTAL HEAD TURNS Instructions: Walk from here to the next mark 6 m (20 ft) away. Begin walking at your normal pace. Keep walking straight; after 3 steps, turn your head to the right and keep walking straight while looking to the right. After 3 more steps, turn your head to the left and keep walking straight while looking left. Continue alternating looking right and left. (2) Mild impairment - Performs head turns smoothly with slight change in gait velocity (eg, minor disruption to smooth gait path), deviates 15.24 -25.4 cm (6 -10 in) outside 30.48-cm (12-in) walkway width, or uses an assistive device.  4.   GAIT WITH VERTICAL HEAD TURNS Instructions: Walk from here to the next mark (6 m [20 ft]). Begin walking at your normal pace. Keep walking straight; after 3 steps, tip your head up and keep walking straight while looking up. After 3 more steps, tip your head down, keep walking straight while looking down. Continue  alternating looking up and down every 3 steps until you have completed 2 repetitions in each direction. (2) Mild impairment - Performs task with slight change in gait velocity (eg, minor disruption to smooth gait path), deviates  15.24 -25.4 cm (6 -10 in) outside 30.48-cm (12-in) walkway width or uses assistive device.  5.  GAIT AND PIVOT TURN Instructions: Begin with walking at your normal pace. When I tell you, "turn and stop," turn as quickly as you can to face the opposite direction and stop. (3) Normal - Pivot turns safely within 3 seconds and stops quickly with no loss of balance  6.   STEP OVER OBSTACLE Instructions: Begin walking at your normal speed. When you come to the shoe box, step over it, not around it, and keep walking. (2) Mild impairment - Is able to step over one shoe box (11.43 cm [4.5 in] total height) without changing gait speed; no evidence of imbalance.  7.   GAIT WITH NARROW BASE OF SUPPORT Instructions: Walk on the floor with arms folded across the chest, feet aligned heel to toe in tandem for a distance of 3.6 m [12 ft]. The number of steps taken in a straight line are counted for a maximum of 10 steps. (1) Moderate impairment - Ambulates 4 -7 steps.  8.   GAIT WITH EYES CLOSED Instructions: Walk at your normal speed from here to the next mark (6 m [20 ft]) with your eyes closed. (2) Mild impairment - Walks 6 m (20 ft), uses assistive device, slower speed, mild gait deviations, deviates 15.24 -25.4 cm (6 -10 in) outside 30.48-cm (12-in) walkway width. Ambulates 6 m (20 ft) in less than 9 seconds but greater than 7 seconds  9.   AMBULATING BACKWARDS Instructions: Walk backwards until I tell you to stop (2) Mild impairment - Walks 6 m (20 ft), uses assistive device, slower speed, mild gait deviations, deviates 15.24 -25.4 cm (6 -10 in) outside 30.48-cm (12-in) walkway width  10. STEPS Instructions: Walk up these stairs as you would at home (ie, using the rail if necessary). At the top turn around and walk down. (2) Mild impairment-Alternating feet, must use rail.  Total 22/30   Interpretation of scores: Non-Specific Older Adults Cutoff Score: <=22/30 = risk of falls Parkinson's Disease Cutoff score  <15/30= fall risk (Hoehn & Yahr 1-4)  Minimally Clinically Important Difference (MCID)  Stroke (acute, subacute, and chronic) = MDC: 4.2 points Vestibular (acute) = MDC: 6 points Community Dwelling Older Adults =  MCID: 4 points Parkinson's Disease  =  MDC: 4.3 points  (Academy of Neurologic Physical Therapy (nd). Functional Gait Assessment. Retrieved from https://www.neuropt.org/docs/default-source/cpgs/core-outcome-measures/function-gait-assessment-pocket-guide-proof9-(2).pdf?sfvrsn=b71f35043_0.)   NMR:  Forward/Backward walking along 15 feet distance- with 2.5 # AW BLE- focusing on improving step length- x 10 reps -Initially 12 steps forward and 18 steps backward- improved to 6 and 9 steps respectively.    PATIENT EDUCATION: Education details: POC.  Person educated: Patient Education method: Medical illustrator Education comprehension: verbalized understanding  HOME EXERCISE PROGRAM: Access Code: [email or web address] URL: [email or web address] Date: 02/19/2024 Prepared by: Massie Dollar  Exercises - Seated March with Ankle Weights at Foot  - 1 x daily - 7 x weekly - 3 sets - 10 reps - Side Stepping with Resistance at Feet  - 1 x daily - 7 x weekly - 3 sets - 10 reps - Tandem Stance  - 1 x daily - 5 x weekly - 3 sets - 4 reps - 20 hold - Staggered Sit-to-Stand  - 1 x daily - 5 x weekly - 2 sets - 5 reps - Sit to Stand with Arms Crossed  - 1 x daily - 5 x weekly - 3 sets - 10 reps - Seated Knee Extension with Resistance  - 1 x daily - 7 x weekly - 3 sets - 10 reps - Seated Hip Abduction with Resistance  - 1 x daily - 7 x weekly - 3 sets - 10 reps - Standing Single Leg Stance with Counter Support  - 1 x daily - 7 x weekly - 3 sets - 10 reps  GOALS: Goals reviewed with patient? Yes  SHORT TERM GOALS: Target date: 07/01/2024   Patient will be independent in home exercise program to improve strength/mobility for better functional independence with ADLs. Baseline: to  be given on visit 2  5/:22: HEP provided and reviewed since 4/17 7/24: reports that she has not complete for ~ 1 MONTH -  reprinted on this day  .    Goal status: IN PROGRESS  LONG TERM GOALS: Target date: 08/19/2024    Patient will increase ABC score to equal to or greater than   10%  to demonstrate statistically significant improvement in mobility and quality of life.  Baseline: 48.75 5/22: 62.5% 7/24: 56.25%  Goal status: IN PROGRESS  2.  Patient (> 17 years old) will complete five times sit to stand test in < 11 seconds indicating an increased LE strength and improved balance. Baseline: 12.41sec 5/22: 11.91sec without UE support average of 2 trials. 7/24: 14.34 sec  Goal status: IN PROGRESS  3.  Patient will increase 6 min walk test to >1258ft to demonstrate decreased fall risk during functional activities with community mobility/  Baseline: 967ft without AD. No rest break needed.  5/22: 934ft without AD no rest break required.  7/24: 1068 no AD> no rest breaks  Goal status: IN PROGRESS  4.  Patient will increase 10 meter walk test to >1.70m/s as to improve gait speed for better community ambulation and to reduce fall risk. Baseline: 0.49m/s 5/22:Average Normal speed: 0.87 m/s; Average Fast speed: 1.03 m/s 7/24: 0.81m/s no AD  Goal status: IN PROGRESS  5.  Patient will reduce timed up and go to <10 seconds to reduce fall risk and demonstrate improved transfer/gait ability. Baseline: 10.94 5/22: 9.08 sec without UE support  7/24: 12.86 sec without AD   Goal status: IN PROGRESS  6.  Patient will increase dynamic gait index score to >24 as to demonstrate reduced fall risk and improved dynamic gait balance for better safety with community/home ambulation.   Baseline: 20; 06/03/2024= 20 Goal status: IN PROGRESS   ASSESSMENT:  CLINICAL IMPRESSION:  Did not reassesses many goals and patient was tested last week for recert.  Did reassess DGI and added and FGA test Both has  similar findings and patient continues to have some dynamic balance impairments but mostly related to break in PT visits.  Patient's condition has the potential to improve in response to therapy. Maximum improvement is yet to be obtained. The anticipated improvement is attainable and reasonable in a generally predictable time.  Pt will continue to benefit from skilled therapy to address remaining deficits in order to improve overall QoL and return to PLOF.       Pt put forth good effort throughout the session with re-assessment of LTG following 2 month break due to insurance denial. Pt demonstrates mild regression in function and balance noted with increased time on 5x STS, increased time on Tug, decreased gait speed, and reduced confidence with mobility/ADL tasks noted in ABC. Pt was able to demonstrate increased distance on walk test without standing rest break. Goals were adjusted as necessary.    OBJECTIVE IMPAIRMENTS: Abnormal gait, cardiopulmonary status limiting activity, decreased activity tolerance, decreased balance, decreased coordination, decreased endurance, decreased knowledge of use of DME, decreased mobility, difficulty walking, decreased ROM, decreased strength, hypomobility, increased edema, increased fascial restrictions, increased muscle spasms, impaired flexibility, impaired sensation, impaired tone, impaired UE functional use, improper body mechanics, postural dysfunction, and pain.   ACTIVITY LIMITATIONS: carrying, lifting, bending, squatting, stairs, transfers, bed mobility, dressing, and locomotion level  PARTICIPATION LIMITATIONS: cleaning, laundry, interpersonal relationship, driving, shopping, community activity, occupation, and yard work  PERSONAL FACTORS: 1-2 comorbidities: hs of CHF and CVA are also affecting patient's functional outcome.   REHAB POTENTIAL: Good  CLINICAL DECISION MAKING: Evolving/moderate complexity  EVALUATION COMPLEXITY:  Moderate  PLAN:  PT FREQUENCY:  1-2x/week  PT DURATION: 12 weeks  PLANNED INTERVENTIONS: 97110-Therapeutic exercises, 97530- Therapeutic activity, [id number]- Neuromuscular re-education, [phone]- Self Care, [id number]- Manual therapy, [phone]- Gait training, [phone]- Orthotic Fit/training, [phone]- Splinting, [phone]- Electrical stimulation (unattended), [phone]- Electrical stimulation (manual), Patient/Family education, Balance training, Stair training, Taping, Dry Needling, Joint mobilization, Joint manipulation, Spinal manipulation, Spinal mobilization, Vestibular training, Visual/preceptual remediation/compensation, DME instructions, Cryotherapy, and Moist heat  PLAN FOR NEXT SESSION:   Continue Dynamic balance training.  R ankle and gluteal strengthening  HEP review   Chyrl London, PT Physical Therapist - [facility]  [facility]  5:17 PM 06/03/24

## 2024-06-04 ENCOUNTER — Telehealth: Payer: Self-pay

## 2024-06-04 DIAGNOSIS — I509 Heart failure, unspecified: Secondary | ICD-10-CM

## 2024-06-07 ENCOUNTER — Telehealth: Payer: Self-pay | Admitting: Internal Medicine

## 2024-06-07 NOTE — Telephone Encounter (Signed)
 Letter has been faxed the day of last appointment

## 2024-06-07 NOTE — Telephone Encounter (Unsigned)
 Copied from CRM #[id number]. Topic: General - Other >> Jun 07, 2024  9:52 AM Tiffini S wrote: Reason for CRM: Patient called to follow up about a request to fax a letter for food stamps fax: [phone]. Please call please back at [phone] for update.

## 2024-06-10 ENCOUNTER — Ambulatory Visit

## 2024-06-10 ENCOUNTER — Ambulatory Visit: Admitting: Physical Therapy

## 2024-06-10 ENCOUNTER — Ambulatory Visit: Attending: Internal Medicine

## 2024-06-10 DIAGNOSIS — I6930 Unspecified sequelae of cerebral infarction: Secondary | ICD-10-CM

## 2024-06-10 DIAGNOSIS — R278 Other lack of coordination: Secondary | ICD-10-CM | POA: Insufficient documentation

## 2024-06-10 DIAGNOSIS — R262 Difficulty in walking, not elsewhere classified: Secondary | ICD-10-CM | POA: Diagnosis present

## 2024-06-10 DIAGNOSIS — M6281 Muscle weakness (generalized): Secondary | ICD-10-CM | POA: Diagnosis not present

## 2024-06-10 DIAGNOSIS — R2689 Other abnormalities of gait and mobility: Secondary | ICD-10-CM | POA: Insufficient documentation

## 2024-06-10 DIAGNOSIS — R2681 Unsteadiness on feet: Secondary | ICD-10-CM | POA: Diagnosis present

## 2024-06-10 NOTE — Therapy (Signed)
 OUTPATIENT PHYSICAL THERAPY NEURO TREATMENT  Patient Name: Anna Tapia MRN: [id number] DOB:November 09, 1990, 33 y.o., female Today's Date: 06/10/2024   PCP: Bernardo Fend, DO  REFERRING PROVIDER: Bernardo Fend, DO   END OF SESSION:  PT End of Session - 06/10/24 1434     Visit Number 11    Number of Visits 24    Date for PT Re-Evaluation 08/19/24    PT Start Time 1445    PT Stop Time 1530    PT Time Calculation (min) 45 min    Equipment Utilized During Treatment Gait belt    Activity Tolerance Patient tolerated treatment well;No increased pain    Behavior During Therapy WFL for tasks assessed/performed            Past Medical History:  Diagnosis Date   Arteriovenous malformation of brain    a. s/p remote coiling.   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   Congenital CHF (congestive heart failure) (HCC)    a. 03/2023 Echo: EF 60-65%, no rwma, nl RV fxn, RVSP 29.28mmHg. Mild MR. Mild Ao sclerosis w/o stenosis. Ao root 38mm.   COVID 2022   Hemorrhagic stroke (HCC) 2023   a. L-sided midbrain, thalamic ICH in setting of PCA/ACA aneurysm s/p embolization.   History of broken collarbone 2020   Hydrocephalus (HCC)    a. s/p VP shunt in childhood.   Precordial chest pain    a. 03/2023 MV: No ischemia or infarct.  EF greater than 65%.  No significant coronary calcification.   Sepsis secondary to UTI (HCC) 2021   Past Surgical History:  Procedure Laterality Date   VENTRICULOPERITONEAL SHUNT     Patient Active Problem List   Diagnosis Date Noted   History of CVA with residual deficit 11/25/2023   CHF (congestive heart failure) (HCC) 03/25/2023   Left-sided nontraumatic intracerebral hemorrhage (HCC) 08/01/2022   AVM (arteriovenous malformation) brain 07/24/2022   Asthma 07/24/2022   Major depressive disorder, recurrent episode, moderate (HCC) 02/07/2022   Sepsis secondary to UTI (HCC) 05/01/2020   VP (ventriculoperitoneal)  shunt status 05/01/2020   Right ovarian cyst 05/01/2020   Elevated LFTs 05/01/2020    ONSET DATE: September  2023  REFERRING DIAG:  Diagnosis  I69.30 (ICD-10-CM) - History of CVA with residual deficit    THERAPY DIAG:  Muscle weakness (generalized)  Difficulty in walking, not elsewhere classified  Other lack of coordination  Unsteadiness on feet  Other abnormalities of gait and mobility  Balance disorder  Rationale for Evaluation and Treatment: Rehabilitation  SUBJECTIVE:  SUBJECTIVE STATEMENT:  Pt reports she is good and glad that she had OT before PT, otherwise she would have been too tired to participate in OT.  Pt states she is looking forward to getting a snack from the vending machine following therapy.    Pt accompanied by: self  PERTINENT HISTORY:  From recent MD visit:  [name] presents to follow up on chronic medical conditions. Having some knee pain today, had been doing well with PT but her insurance is no longer paying for this until the new year.    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  Also on Lyrica 50 mg BID  -Difficulty using using right hand and right side of face - completed home PT/OT but interested in continuing with therapy outside the home.  Hx of CHF as a newborn: -Had been following with Cardiology at [facility], last seen in 2018 -Last echo 9/23 EF 58% -Denies chest pain, palpitations, shortness of breath or  lower extremity swelling  PAIN:  Are you having pain? Yes: NPRS scale: 4/10 Pain location: R shoulder  Pain description: dull pain  Aggravating factors: moving arm  Relieving factors: n/a   PRECAUTIONS: Fall  RED FLAGS: None   WEIGHT BEARING RESTRICTIONS: No  FALLS: Has patient fallen in last 6 months? No  LIVING ENVIRONMENT: Lives with: lives with their family Lives in: House/apartment Stairs: Yes: External: 2 steps; on right going up and on left going up Has following equipment at home: Single point cane  PLOF: Independent, Independent with basic ADLs, and prior to CVA   PATIENT GOALS: improve walking. Get around better   OBJECTIVE:  Note: Objective measures were completed at Evaluation unless otherwise noted.  DIAGNOSTIC FINDINGS:   CT 2023 IMPRESSION: 1. 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 Again, prominent arterial contribution to the AVM is seen from the left ACA and posterior circulation, with primary venous drainage into the deep venous system. Associated 7 mm aneurysm along the right anterolateral aspect of the AVM as above. 2. Diffuse tortuosity and ectasia elsewhere about the major arterial vasculature of the head and neck. No large vessel occlusion or hemodynamically significant stenosis. No other acute vascular abnormality.    COGNITION: Overall cognitive status: Within functional limits for tasks assessed   SENSATION: Light touch: Impaired  tight feeling on the R foot due to swelling   COORDINATION: Spastic hemiplegia on the R side.   EDEMA:  Circumferential:  distal to knee in sitting R: 41.6CM L 39cm  MUSCLE TONE: RLE: Mild noted increased tone with gait and mobility.   DTRs:  Patella 1 = Trace   POSTURE: rounded shoulders, forward head, and left pelvic obliquity  LOWER EXTREMITY ROM:     Active  Right Eval Left Eval  Hip flexion [facility]  Hip extension [facility]  Hip  abduction    Hip adduction    Hip internal rotation    Hip external rotation    Knee flexion 110 WFL  Knee extension [facility]  Ankle dorsiflexion 8 15  Ankle plantarflexion WF WFL  Ankle inversion  Ankle eversion     (Blank rows = not tested)  LOWER EXTREMITY MMT:    03/25/24 MMT Right Eval Left Eval  Hip flexion 4+ 4+  Hip extension    Hip abduction 5 5  Hip adduction 5 5  Hip internal rotation    Hip external rotation    Knee flexion 4+ 5  Knee extension 5 5  Ankle dorsiflexion 4 5  Ankle plantarflexion    Ankle inversion    Ankle eversion    (Blank rows = not tested)  BED MOBILITY:  Sit to supine SBA Supine to sit SBA Rolling to Right SBA Rolling to Left SBA  TRANSFERS: Assistive device utilized: None  Sit to stand: SBA Stand to sit: SBA Chair to chair: SBA Floor: need to assess  CURB:  Level of Assistance: Modified independence and SBA Assistive device utilized: rail  Curb Comments: step to descent   STAIRS: Level of Assistance: Modified independence Stair Negotiation Technique: Step to Pattern with descent and Single Rail on Left Number of Stairs: 4  Height of Stairs: 6  Comments: mild posterior bias on descent. And step to pattern   GAIT: Gait pattern: step through pattern, decreased stance time- Right, Right hip hike, trunk rotated posterior- Left, wide BOS, and poor foot clearance- Right Distance walked: 60 Assistive device utilized: None Level of assistance: Modified independence and SBA Comments: see description  FUNCTIONAL TESTS:  See Goal assessment.   PATIENT SURVEYS:  ABC scale 48.75  See goals assessment                                                                                                                              TREATMENT DATE: 06/10/24   NMR:    Patient ambulated 20 min indoor/outdoor today- including tile, concrete, carpet, horizontal head turns, on varied surfaces: concrete, brick, asphalt, mulch, up ramp,  inclines, declines- with 0 seated rest break(s) - CGA for all indoor/outdoor - no instances of instability when ambulating over mulch - Focusing on overall endurance and to improve ambulation across uneven terrain that pt would come in contact with in the community.  3# AW donned and CGA applied throughout the attempt.   TherAct:  Static stance cross body punches, 60 sec  Lateral stepping with cross body boxing, length of hallways x2 Static stance cross body punches with core ducking to miss therapist going over top, 30 sec bouts x2  Seated LAQ with verbal and visual demonstration for improve quad activation necessary for eccentric lowering into seated position, 3# AW donned, 2x10 each LE Seated marching with 3# AW donned, 2x10 each LE with pt seated upright with no back support for improved core stability while performing LE strengthening   Supervision assist provided by PT throughout session unless otherwise noted.   PATIENT EDUCATION: Education details: POC.  Person educated: Patient Education method: Medical illustrator Education comprehension: verbalized understanding  HOME EXERCISE PROGRAM: Access Code: [email or web address] URL: [email or web address] Date:  02/19/2024 Prepared by: Massie Dollar  Exercises - Seated March with Ankle Weights at Foot  - 1 x daily - 7 x weekly - 3 sets - 10 reps - Side Stepping with Resistance at Feet  - 1 x daily - 7 x weekly - 3 sets - 10 reps - Tandem Stance  - 1 x daily - 5 x weekly - 3 sets - 4 reps - 20 hold - Staggered Sit-to-Stand  - 1 x daily - 5 x weekly - 2 sets - 5 reps - Sit to Stand with Arms Crossed  - 1 x daily - 5 x weekly - 3 sets - 10 reps - Seated Knee Extension with Resistance  - 1 x daily - 7 x weekly - 3 sets - 10 reps - Seated Hip Abduction with Resistance  - 1 x daily - 7 x weekly - 3 sets - 10 reps - Standing Single Leg Stance with Counter Support  - 1 x daily - 7 x weekly - 3 sets - 10 reps  GOALS: Goals  reviewed with patient? Yes  SHORT TERM GOALS: Target date: 07/01/2024  Patient will be independent in home exercise program to improve strength/mobility for better functional independence with ADLs. Baseline: to be given on visit 2  5/:22: HEP provided and reviewed since 4/17 7/24: reports that she has not complete for ~ 1 MONTH - reprinted on this day  .    Goal status: IN PROGRESS  LONG TERM GOALS: Target date: 08/19/2024  Patient will increase ABC score to equal to or greater than   10%  to demonstrate statistically significant improvement in mobility and quality of life.  Baseline: 48.75 5/22: 62.5% 7/24: 56.25%  Goal status: IN PROGRESS  2.  Patient (> 18 years old) will complete five times sit to stand test in < 11 seconds indicating an increased LE strength and improved balance. Baseline: 12.41sec 5/22: 11.91sec without UE support average of 2 trials. 7/24: 14.34 sec  Goal status: IN PROGRESS  3.  Patient will increase 6 min walk test to >1270ft to demonstrate decreased fall risk during functional activities with community mobility/  Baseline: 973ft without AD. No rest break needed.  5/22: 968ft without AD no rest break required.  7/24: 1068 no AD> no rest breaks  Goal status: IN PROGRESS  4.  Patient will increase 10 meter walk test to >1.11m/s as to improve gait speed for better community ambulation and to reduce fall risk. Baseline: 0.43m/s 5/22:Average Normal speed: 0.87 m/s; Average Fast speed: 1.03 m/s 7/24: 0.29m/s no AD  Goal status: IN PROGRESS  5.  Patient will reduce timed up and go to <10 seconds to reduce fall risk and demonstrate improved transfer/gait ability. Baseline: 10.94 5/22: 9.08 sec without UE support  7/24: 12.86 sec without AD   Goal status: IN PROGRESS  6.  Patient will increase dynamic gait index score to >24 as to demonstrate reduced fall risk and improved dynamic gait balance for better safety with community/home ambulation.   Baseline:  20; 06/03/2024= 20 Goal status: IN PROGRESS   ASSESSMENT:  CLINICAL IMPRESSION:  Pt responded well to the exercises and noted fatigue at the end of the session, but put forth good effort throughout.  Pt was able to navigate ambulation outside without any complications other than requiring one standing rest break.  Pt continues to improve in her distance and endurance with ambulation attempts while also wearing increased resistance in AW's bilaterally.  Pt encouraged to stay active  at home with HEP between sessions to improve current mobility and endurance levels.   Pt will continue to benefit from skilled therapy to address remaining deficits in order to improve overall QoL and return to PLOF.       OBJECTIVE IMPAIRMENTS: Abnormal gait, cardiopulmonary status limiting activity, decreased activity tolerance, decreased balance, decreased coordination, decreased endurance, decreased knowledge of use of DME, decreased mobility, difficulty walking, decreased ROM, decreased strength, hypomobility, increased edema, increased fascial restrictions, increased muscle spasms, impaired flexibility, impaired sensation, impaired tone, impaired UE functional use, improper body mechanics, postural dysfunction, and pain.   ACTIVITY LIMITATIONS: carrying, lifting, bending, squatting, stairs, transfers, bed mobility, dressing, and locomotion level  PARTICIPATION LIMITATIONS: cleaning, laundry, interpersonal relationship, driving, shopping, community activity, occupation, and yard work  PERSONAL FACTORS: 1-2 comorbidities: hs of CHF and CVA are also affecting patient's functional outcome.   REHAB POTENTIAL: Good  CLINICAL DECISION MAKING: Evolving/moderate complexity  EVALUATION COMPLEXITY: Moderate  PLAN:  PT FREQUENCY: 1-2x/week  PT DURATION: 12 weeks  PLANNED INTERVENTIONS: 97110-Therapeutic exercises, 97530- Therapeutic activity, 97112- Neuromuscular re-education, 97535- Self Care, [id number]- Manual  therapy, [phone]- Gait training, [phone]- Orthotic Fit/training, [phone]- Splinting, [phone]- Electrical stimulation (unattended), [phone]- Electrical stimulation (manual), Patient/Family education, Balance training, Stair training, Taping, Dry Needling, Joint mobilization, Joint manipulation, Spinal manipulation, Spinal mobilization, Vestibular training, Visual/preceptual remediation/compensation, DME instructions, Cryotherapy, and Moist heat  PLAN FOR NEXT SESSION:   Continue Dynamic balance training.  R ankle and gluteal strengthening  HEP review    Fonda Simpers, PT, DPT Physical Therapist - [facility]  06/10/24, 6:36 PM

## 2024-06-10 NOTE — Therapy (Signed)
 OUTPATIENT OCCUPATIONAL THERAPY NEURO TREATMENT NOTE   Patient Name: Anna Tapia MRN: [id number] DOB:1991-09-09, 33 y.o., female Today's Date: 06/13/2024  PCP: Dr. Sharyle Fischer  REFERRING PROVIDER: Dr. Sharyle Fischer  END OF SESSION:  OT End of Session - 06/13/24 2007     Visit Number 14    Number of Visits 25    Date for OT Re-Evaluation 08/12/24    Authorization Type [employer]; shara required    OT Start Time 1400    OT Stop Time 1445    OT Time Calculation (min) 45 min    Activity Tolerance Patient tolerated treatment well    Behavior During Therapy WFL for tasks assessed/performed         Past Medical History:  Diagnosis Date   Arteriovenous malformation of brain    a. s/p remote coiling.   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   Congenital CHF (congestive heart failure) (HCC)    a. 03/2023 Echo: EF 60-65%, no rwma, nl RV fxn, RVSP 29.58mmHg. Mild MR. Mild Ao sclerosis w/o stenosis. Ao root 38mm.   COVID 2022   Hemorrhagic stroke (HCC) 2023   a. L-sided midbrain, thalamic ICH in setting of PCA/ACA aneurysm s/p embolization.   History of broken collarbone 2020   Hydrocephalus (HCC)    a. s/p VP shunt in childhood.   Precordial chest pain    a. 03/2023 MV: No ischemia or infarct.  EF greater than 65%.  No significant coronary calcification.   Sepsis secondary to UTI (HCC) 2021   Past Surgical History:  Procedure Laterality Date   VENTRICULOPERITONEAL SHUNT     Patient Active Problem List   Diagnosis Date Noted   History of CVA with residual deficit 11/25/2023   CHF (congestive heart failure) (HCC) 03/25/2023   Left-sided nontraumatic intracerebral hemorrhage (HCC) 08/01/2022   AVM (arteriovenous malformation) brain 07/24/2022   Asthma 07/24/2022   Major depressive disorder, recurrent episode, moderate (HCC) 02/07/2022   Sepsis secondary to UTI (HCC) 05/01/2020   VP (ventriculoperitoneal) shunt status  05/01/2020   Right ovarian cyst 05/01/2020   Elevated LFTs 05/01/2020   ONSET DATE: Sept 2023  REFERRING DIAG: I69.30 (ICD-10-CM) - History of CVA with residual deficit   THERAPY DIAG:  Muscle weakness (generalized)  Other lack of coordination  History of CVA with residual deficit  Rationale for Evaluation and Treatment: Rehabilitation  SUBJECTIVE:  SUBJECTIVE STATEMENT: Pt reports she's been regularly checking her BP and her numbers have been consistently good since starting her BP med after her ED visit after last OT session.     Pt accompanied by: self, aunt  PERTINENT HISTORY: Pt known to this clinic after participation in therapy in 2024 following her L CVA in 2023.  Pt was discharged from OT in Oct of 2024 d/t visit limitations with therapy.  Pt returns today to address RUE residual deficits from CVA after most recent Botox injections on 01/21/24.  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 Yes: NPRS scale: 4-5/10 R shoulder from recent Botox  Pain location: R shoulder  Pain description: achy Aggravating factors: Botox shot Relieving factors: heat, rest  FALLS: Has patient fallen in  last 6 months? No  LIVING ENVIRONMENT: Lives with: lives with their family mother and 3 brothers, but mostly staying with her aunt  Lives in: ground level apartment with aunt  Stairs: Yes: External: 3 steps; bilateral but cannot reach both Has following equipment at home: Single point cane  PLOF: Independent prior to CVA  PATIENT GOALS: Using my arm more.   OBJECTIVE:  Note: Objective measures were completed at Evaluation unless otherwise noted.  HAND DOMINANCE: Left  ADLs: Overall ADLs: Pt reports that she  tries to engage the R hand into everything she does Transfers/ambulation related to ADLs: indep Eating: cuts food with L hand Grooming: increased time to engage the R  UB Dressing: Increased time with clothing fasteners LB Dressing: wears slip on shoes; wore jeans today and did ok with the clothing fasteners, extra time  Toileting: indep Bathing: occasional pain when reaching over with the R arm to bathe the L arm (R pec and shoulder pain) Tub Shower transfers: modified indep Equipment: Shower seat with back, Grab bars, and hand held shower hose   IADLs: Shopping: pt can go shopping accompanied by a family member  Light housekeeping: pt reports unable to sweep; pt reports she gets dizzy  Meal Prep: Pt reports she now cooks about 3 nights a week, but has difficulty opening containers/jars/food packages in the [employer] mobility: uses cane Medication management: modified indep; increased time to engage the R hand Financial management: mother manages (pt states she doesn't really have any bills)  Handwriting: NT (pt is L hand dominant)  MOBILITY STATUS: Uses cane for longer distances, but did not bring cane today  POSTURE COMMENTS:  rounded shoulders Sitting balance: Moves/returns truncal midpoint >2 inches in all planes  ACTIVITY TOLERANCE: Activity tolerance: Pt fatigues with IADLs and community mobility  FUNCTIONAL OUTCOME MEASURES: Upper Extremity Functional Scale (UEFS): TBD  UPPER EXTREMITY ROM:    Active ROM Left  Eval WNL throughout Right OT d/c on 08/12/23 (Previous episode of care) Right Eval on 01/29/24  Right 04/15/24  Shoulder flexion  133 (135) 125 (134) 125 (134)  Shoulder abduction  120 (125) 90 (104) 122 (125)  Shoulder adduction      Shoulder extension      Shoulder internal rotation  R thumb to lumbar spine (better clearance of SI joint) To R side of lower back-good clearance of SI joint To R side of mid back-good clearance of SI joint   Shoulder  external rotation  Hand to back of head without chin tuck and good abd  63* with arm abd (Slight scaption) 78*  Elbow flexion      Elbow extension      Wrist flexion      Wrist extension      Wrist ulnar deviation      Wrist radial deviation      Wrist pronation      Wrist supination      (Blank rows = not tested)  UPPER EXTREMITY MMT:     MMT Left Eval 5/5 throughout Right OT d/c on 08/12/23 (Previous episode of care) Right Eval 01/29/24 Right 04/15/24  Shoulder flexion  4- 4 4+  Shoulder abduction  4- 4 4  Shoulder adduction      Shoulder extension      Shoulder internal rotation  4 4 4+  Shoulder external rotation  4- 4 4  Middle trapezius      Lower trapezius      Elbow flexion  4+ 4+  4+  Elbow extension  5 5 5   Wrist flexion  4+ 4+ 4+  Wrist extension  4+ 4+ 4+  Wrist ulnar deviation      Wrist radial deviation      Wrist pronation      Wrist supination      (Blank rows = not tested)  HAND FUNCTION: 08/12/23: Grip strength: Right: 14 lbs, Left 60 lbs; lateral pinch: Right: 6 lbs, Left: 16 lbs , 3 point pinch: Right: 5 (IP flexion; Saehan pinch gauge), Left: 19 lbs  01/29/24 Eval: Grip strength: Right: 18 lbs; Left: 55 lbs, Lateral pinch: Right: 5 lbs, Left: 14 lbs, and 3 point pinch: Right:  Fingers slip- standard pinch gauge used 2 lbs, Left: 15 lbs 04/15/24: Grip strength: Right 28 lbs; Lateral pinch: Right: 8 lbs, 3 point pinch: 3 lbs (fingers slipped)  COORDINATION: 08/12/23: Right: 5 min and 19 sec; 2nd trial 3 min 49 sec, Left 23 sec,  01/29/24 Eval: 9 Hole Peg test: Right: 2 min 41 sec  sec; Left: 23 sec 04/15/24: Attempted/increased challenge and frustration with this today; will attempt next visit 04/22/24: 9 Hole Peg test: Right: 5 min 3 sec (when un-timed pt completes in ~ 1/2 the time) 05/20/24: 9 Hole Peg test: Right: >5 min to place 7 of 9 pegs with repeat trials   SENSATION: Light touch: Impaired , tingling in the R hand   EDEMA: No visible  edema  MUSCLE TONE: RUE: Mild and Moderate (Last round of Botox on 01/21/24)  COGNITION: Overall cognitive status: Within functional limits for tasks assessed  VISION: WFL; no recent changes  PERCEPTION: WFL  PRAXIS: Impaired: Motor planning and Clonus R hand  OBSERVATIONS:  Pt remains very motivated to increase functional use of RUE for daily tasks.                                                                                                              TREATMENT DATE: 06/11/24 Neuro re-ed: -Facilitated R hand FMC/dexterity skills working to place/remove/rotate [place]  discs in/out of grid, 1 by 1.  Mod vc and demo for translatory skills to move discs from palm to fingertips in prep to rotate discs between black and red sides.   Therapeutic Activity: -Facilitated R hand and bilat hand coordination skills with deck of cards, including sorting, flipping, dealing, and shuffling.  Pt given intermittent vc for grasping cards using a lumbrical or 3 fingered grasp pattern.    Therapeutic Exercise: -Facilitated R hand lateral and 3 point pinch strengthening moving therapy resistant clothespins (yellow, red, green) on/off a vertical and horizontal dowel.   PATIENT EDUCATION: Education details: HEP Person educated: Patient Education method: Explanation Education comprehension: verbalized understanding  HOME EXERCISE PROGRAM: Pink theraputty, self passive stretching for R hand digit ext/thumb flex/abd Deck of cards: shuffling, dealing, sorting/flipping  GOALS: Goals reviewed with patient? Yes  SHORT TERM GOALS: Target date: 03/11/24  Pt will be indep to perform HEP for improving R hand strength and coordination for daily tasks. Baseline: Eval: Not yet  initiated; 04/15/24: indep with theraputty and RUE stretching Goal status: achieved   LONG TERM GOALS: Target date: 08/12/24  Pt will increase UEFS by 10 or more points to indicate self perceived functional improvement when engaging  the RUE into daily tasks. Baseline: Eval: TBD; 04/15/24: D/c/ NT Goal status: d/c  2.  Pt will increase R grip strength by 5 or more lbs to securely stabilize containers and jars for easier opening.  Baseline: Eval: R grip strength 18 lbs (L 55 lbs); 04/15/24: R grip strength 28 lbs Goal status: exceeded/achieved  3.  Pt will increase R hand dexterity/FMC skills to manipulate clothing fasteners with bilat hands.  Baseline: Eval: Unable to hook bra and manages buttons with L hand only; 04/15/24: now able to hook bra bimanually, able to wear button up shirt today with extra time to fasten; multiple attempts at 9 hole peg test today before test terminated d/t frustration  Goal status: in progress  4.  Pt will increase R active shoulder flexion to 140* or better to achieve functional ROM for UB ADLs and reaching for ADL supplies.  Baseline: Eval: R shoulder flexion 125*; difficulty reaching overhead for ADL supplies; 04/15/24: 125 (134); focus has been on R hand dexterity skills in most sessions per pt request; but pt reports consistent stretching at home Goal status: in progress  ASSESSMENT:  CLINICAL IMPRESSION: Pt continues to demonstrate improvements with ability to sustain a lumbrical grasp and a 3 fingered grasp pattern with better control and stability when grasping [place]  blocks and cards from a deck today.  OT has encouraged pt add card activities to HEP to continue to target R [facility] and bilat [facility] skills in the home.  Pt receptive to this and reported that she thinks her mom has a deck of cards at home.  R hand tolerated yellow, red, and green resistive pins, but unable to manage blue and black more resistive pins.  Pt is better engaging the R LF into 3 point pinch patterns when working with the clips as observed when R LF occasionally slides from the pin, pt self corrects with fewer cues from OT.  Pt will continue to benefit from skilled OT to increase engagement of the RUE into daily tasks,  with focus on strength and coordination skills in the R hand, and activity modification/AE training, working to maximize pt's indep with ADL/IADL tasks.    PERFORMANCE DEFICITS: in functional skills including ADLs, IADLs, coordination, dexterity, sensation, tone, ROM, strength, pain, flexibility, Fine motor control, Gross motor control, mobility, balance, body mechanics, endurance, decreased knowledge of use of DME, and UE functional use, and psychosocial skills including coping strategies, environmental adaptation, habits, and routines and behaviors.   IMPAIRMENTS: are limiting patient from ADLs, IADLs, work, leisure, and social participation.   CO-MORBIDITIES: has co-morbidities such as CHF, asthma, depression that affects occupational performance. Patient will benefit from skilled OT to address above impairments and improve overall function.  MODIFICATION OR ASSISTANCE TO COMPLETE EVALUATION: No modification of tasks or assist necessary to complete an evaluation.  OT OCCUPATIONAL PROFILE AND HISTORY: Detailed assessment: Review of records and additional review of physical, cognitive, psychosocial history related to current functional performance.  CLINICAL DECISION MAKING: Moderate - several treatment options, min-mod task modification necessary  REHAB POTENTIAL: Good  EVALUATION COMPLEXITY: Moderate    PLAN:  OT FREQUENCY: 1-2x/week  OT DURATION: 12 weeks  PLANNED INTERVENTIONS: 97168 OT Re-evaluation, 97535 self care/ADL training, 02889 therapeutic exercise, 97530 therapeutic activity, 97112 neuromuscular re-education, 97140 manual  therapy, 97010 moist heat, 97010 cryotherapy, passive range of motion, balance training, functional mobility training, psychosocial skills training, energy conservation, coping strategies training, patient/family education, and DME and/or AE instructions  RECOMMENDED OTHER SERVICES: Pt completed PT evaluation this date  CONSULTED AND AGREED WITH PLAN OF  CARE: Patient  PLAN FOR NEXT SESSION: see above  Inocente Blazing, MS, OTR/L  Inocente MARLA Blazing, OT 06/13/2024, 8:10 PM

## 2024-06-15 ENCOUNTER — Other Ambulatory Visit: Payer: Self-pay | Admitting: Internal Medicine

## 2024-06-15 DIAGNOSIS — Z419 Encounter for procedure for purposes other than remedying health state, unspecified: Secondary | ICD-10-CM | POA: Diagnosis not present

## 2024-06-15 DIAGNOSIS — I6930 Unspecified sequelae of cerebral infarction: Secondary | ICD-10-CM

## 2024-06-17 ENCOUNTER — Ambulatory Visit

## 2024-06-17 ENCOUNTER — Ambulatory Visit: Admitting: Physical Therapy

## 2024-06-17 DIAGNOSIS — R2681 Unsteadiness on feet: Secondary | ICD-10-CM

## 2024-06-17 DIAGNOSIS — R262 Difficulty in walking, not elsewhere classified: Secondary | ICD-10-CM

## 2024-06-17 DIAGNOSIS — M6281 Muscle weakness (generalized): Secondary | ICD-10-CM

## 2024-06-17 DIAGNOSIS — I6930 Unspecified sequelae of cerebral infarction: Secondary | ICD-10-CM | POA: Diagnosis not present

## 2024-06-17 DIAGNOSIS — R278 Other lack of coordination: Secondary | ICD-10-CM

## 2024-06-17 DIAGNOSIS — R2689 Other abnormalities of gait and mobility: Secondary | ICD-10-CM

## 2024-06-17 NOTE — Therapy (Signed)
 OUTPATIENT PHYSICAL THERAPY NEURO TREATMENT  Patient Name: Anna Tapia MRN: [id number] DOB:[birth year], 33 y.o., female Today's Date: 06/17/2024   PCP: Bernardo Fend, DO  REFERRING PROVIDER: Bernardo Fend, DO   END OF SESSION:  PT End of Session - 06/17/24 1426     Visit Number 12    Number of Visits 24    Date for PT Re-Evaluation 08/19/24    PT Start Time 1530    PT Stop Time 1612    PT Time Calculation (min) 42 min    Equipment Utilized During Treatment Gait belt    Activity Tolerance Patient tolerated treatment well;No increased pain    Behavior During Therapy WFL for tasks assessed/performed             Past Medical History:  Diagnosis Date   Arteriovenous malformation of brain    a. s/p remote coiling.   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   Congenital CHF (congestive heart failure) (HCC)    a. 03/2023 Echo: EF 60-65%, no rwma, nl RV fxn, RVSP 29.39mmHg. Mild MR. Mild Ao sclerosis w/o stenosis. Ao root 38mm.   COVID 2022   Hemorrhagic stroke (HCC) 2023   a. L-sided midbrain, thalamic ICH in setting of PCA/ACA aneurysm s/p embolization.   History of broken collarbone 2020   Hydrocephalus (HCC)    a. s/p VP shunt in childhood.   Precordial chest pain    a. 03/2023 MV: No ischemia or infarct.  EF greater than 65%.  No significant coronary calcification.   Sepsis secondary to UTI (HCC) 2021   Past Surgical History:  Procedure Laterality Date   VENTRICULOPERITONEAL SHUNT     Patient Active Problem List   Diagnosis Date Noted   History of CVA with residual deficit 11/25/2023   CHF (congestive heart failure) (HCC) 03/25/2023   Left-sided nontraumatic intracerebral hemorrhage (HCC) 08/01/2022   AVM (arteriovenous malformation) brain 07/24/2022   Asthma 07/24/2022   Major depressive disorder, recurrent episode, moderate (HCC) 02/07/2022   Sepsis secondary to UTI (HCC) 05/01/2020   VP  (ventriculoperitoneal) shunt status 05/01/2020   Right ovarian cyst 05/01/2020   Elevated LFTs 05/01/2020    ONSET DATE: September  2023  REFERRING DIAG:  Diagnosis  I69.30 (ICD-10-CM) - History of CVA with residual deficit    THERAPY DIAG:  Muscle weakness (generalized)  History of CVA with residual deficit  Difficulty in walking, not elsewhere classified  Unsteadiness on feet  Other abnormalities of gait and mobility  Balance disorder  Rationale for Evaluation and Treatment: Rehabilitation  SUBJECTIVE:  SUBJECTIVE STATEMENT:  Pt reports doing well today. Pt denies any recent falls/stumbles since prior session. Pt denies any updates to medications or medical appointment since prior session. Pt reports good compliance with HEP when time permits. Pt is excited to come to stroke support group next week.     Pt accompanied by: self  PERTINENT HISTORY:  From recent MD visit:  [name] presents to follow up on chronic medical conditions. Having some knee pain today, had been doing well with PT but her insurance is no longer paying for this until the new year.    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  Also on Lyrica 50 mg BID  -Difficulty using using right hand and right side of face - completed home PT/OT but interested in continuing with therapy outside the home.  Hx of CHF as a newborn: -Had been following with [facility]iology at [facility], last  seen in 2018 -Last echo 9/23 EF 58% -Denies chest pain, palpitations, shortness of breath or lower extremity swelling  PAIN:  Are you having pain? Yes: NPRS scale: 4/10 Pain location: R shoulder  Pain description: dull pain  Aggravating factors: moving arm  Relieving factors: n/a   PRECAUTIONS: Fall  RED FLAGS: None   WEIGHT BEARING RESTRICTIONS: No  FALLS: Has patient fallen in last 6 months? No  LIVING ENVIRONMENT: Lives with: lives with their family Lives in: House/apartment Stairs: Yes: External: 2 steps; on right going up and on left going up Has following equipment at home: Single point cane  PLOF: Independent, Independent with basic ADLs, and prior to CVA   PATIENT GOALS: improve walking. Get around better   OBJECTIVE:  Note: Objective measures were completed at Evaluation unless otherwise noted.  DIAGNOSTIC FINDINGS:   CT 2023 IMPRESSION: 1. 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 Again, prominent arterial contribution to the AVM is seen from the left ACA and posterior circulation, with primary venous drainage into the deep venous system. Associated 7 mm aneurysm along the right anterolateral aspect of the AVM as above. 2. Diffuse tortuosity and ectasia elsewhere about the major arterial vasculature of the head and neck. No large vessel occlusion or hemodynamically significant stenosis. No other acute vascular abnormality.    COGNITION: Overall cognitive status: Within functional limits for tasks assessed   SENSATION: Light touch: Impaired  tight feeling on the R foot due to swelling   COORDINATION: Spastic hemiplegia on the R side.   EDEMA:  Circumferential:  distal to knee in sitting R: 41.6CM L 39cm  MUSCLE TONE: RLE: Mild noted increased tone with gait and mobility.   DTRs:  Patella 1 = Trace   POSTURE: rounded shoulders, forward head, and left pelvic obliquity  LOWER EXTREMITY  ROM:     Active  Right Eval Left Eval  Hip flexion [facility]  Hip extension [facility]  Hip abduction    Hip adduction    Hip internal rotation    Hip external rotation    Knee flexion 110 WFL  Knee extension [facility]  Ankle dorsiflexion 8 15  Ankle plantarflexion WF  WFL  Ankle inversion    Ankle eversion     (Blank rows = not tested)  LOWER EXTREMITY MMT:    03/25/24 MMT Right Eval Left Eval  Hip flexion 4+ 4+  Hip extension    Hip abduction 5 5  Hip adduction 5 5  Hip internal rotation    Hip external rotation    Knee flexion 4+ 5  Knee extension 5 5  Ankle dorsiflexion 4 5  Ankle plantarflexion    Ankle inversion    Ankle eversion    (Blank rows = not tested)  BED MOBILITY:  Sit to supine SBA Supine to sit SBA Rolling to Right SBA Rolling to Left SBA  TRANSFERS: Assistive device utilized: None  Sit to stand: SBA Stand to sit: SBA Chair to chair: SBA Floor: need to assess  CURB:  Level of Assistance: Modified independence and SBA Assistive device utilized: rail  Curb Comments: step to descent   STAIRS: Level of Assistance: Modified independence Stair Negotiation Technique: Step to Pattern with descent and Single Rail on Left Number of Stairs: 4  Height of Stairs: 6  Comments: mild posterior bias on descent. And step to pattern   GAIT: Gait pattern: step through pattern, decreased stance time- Right, Right hip hike, trunk rotated posterior- Left, wide BOS, and poor foot clearance- Right Distance walked: 60 Assistive device utilized: None Level of assistance: Modified independence and SBA Comments: see description  FUNCTIONAL TESTS:  See Goal assessment.   PATIENT SURVEYS:  ABC scale 48.75  See goals assessment                                                                                                                              TREATMENT DATE: 06/17/24    TA- To improve functional movements patterns for everyday tasks  Gait with 3# AW  x 450 ft with cues for faster pace than typical  Staggered STS with R LE post x 10 reps Gait with 3# AW x 450 ft with cues for faster pace than typical  Staggered STS with R LE post x 10 reps Gait with 3# AW x 450 ft with cues for faster pace than typical  Staggered heel raises with RLE post and R LE ant 2 x 15 reps with UE support for balance  Sidestepping with ankle weights x 12 ft x 5 laps, cues for R LE adduction to prevent foot drag   Sidestepping no weights but over 1/2 foam rollers x 5 laps of 12 ft   Stepping over 1/2 foam rollers placed at distance for step over step pattern of gait ( 4 foam rollers) x 5 laps  *1 lap = 1 time each way to return to start point   Unless otherwise stated, SBA was provided and gait belt donned in order to ensure pt safety    PATIENT EDUCATION: Education details: POC.  Person educated: Patient Education method: Medical illustrator Education comprehension: verbalized  understanding  HOME EXERCISE PROGRAM: Access Code: [email or web address] URL: [email or web address] Date: 02/19/2024 Prepared by: Massie Dollar  Exercises - Seated March with Ankle Weights at Foot  - 1 x daily - 7 x weekly - 3 sets - 10 reps - Side Stepping with Resistance at Feet  - 1 x daily - 7 x weekly - 3 sets - 10 reps - Tandem Stance  - 1 x daily - 5 x weekly - 3 sets - 4 reps - 20 hold - Staggered Sit-to-Stand  - 1 x daily - 5 x weekly - 2 sets - 5 reps - Sit to Stand with Arms Crossed  - 1 x daily - 5 x weekly - 3 sets - 10 reps - Seated Knee Extension with Resistance  - 1 x daily - 7 x weekly - 3 sets - 10 reps - Seated Hip Abduction with Resistance  - 1 x daily - 7 x weekly - 3 sets - 10 reps - Standing Single Leg Stance with Counter Support  - 1 x daily - 7 x weekly - 3 sets - 10 reps  GOALS: Goals reviewed with patient? Yes  SHORT TERM GOALS: Target date: 07/01/2024  Patient will be independent in home exercise program to improve strength/mobility  for better functional independence with ADLs. Baseline: to be given on visit 2  5/:22: HEP provided and reviewed since 4/17 7/24: reports that she has not complete for ~ 1 MONTH - reprinted on this day  .    Goal status: IN PROGRESS  LONG TERM GOALS: Target date: 08/19/2024  Patient will increase ABC score to equal to or greater than   10%  to demonstrate statistically significant improvement in mobility and quality of life.  Baseline: 48.75 5/22: 62.5% 7/24: 56.25%  Goal status: IN PROGRESS  2.  Patient (> 49 years old) will complete five times sit to stand test in < 11 seconds indicating an increased LE strength and improved balance. Baseline: 12.41sec 5/22: 11.91sec without UE support average of 2 trials. 7/24: 14.34 sec  Goal status: IN PROGRESS  3.  Patient will increase 6 min walk test to >1247ft to demonstrate decreased fall risk during functional activities with community mobility/  Baseline: 940ft without AD. No rest break needed.  5/22: 942ft without AD no rest break required.  7/24: 1068 no AD> no rest breaks  Goal status: IN PROGRESS  4.  Patient will increase 10 meter walk test to >1.41m/s as to improve gait speed for better community ambulation and to reduce fall risk. Baseline: 0.41m/s 5/22:Average Normal speed: 0.87 m/s; Average Fast speed: 1.03 m/s 7/24: 0.61m/s no AD  Goal status: IN PROGRESS  5.  Patient will reduce timed up and go to <10 seconds to reduce fall risk and demonstrate improved transfer/gait ability. Baseline: 10.94 5/22: 9.08 sec without UE support  7/24: 12.86 sec without AD   Goal status: IN PROGRESS  6.  Patient will increase dynamic gait index score to >24 as to demonstrate reduced fall risk and improved dynamic gait balance for better safety with community/home ambulation.   Baseline: 20; 06/03/2024= 20 Goal status: IN PROGRESS   ASSESSMENT:  CLINICAL IMPRESSION:   Patient continues to progress with endurance training utilizing  interval style workout with isolated right lower extremity strengthening between each long-distance gait belt.  Patient also practices with variable challenges to improve her ability to walk and maintain her balance in various settings and various movement patterns.  Pt will continue to benefit from skilled  therapy to address remaining deficits in order to improve overall QoL and return to PLOF.       OBJECTIVE IMPAIRMENTS: Abnormal gait, cardiopulmonary status limiting activity, decreased activity tolerance, decreased balance, decreased coordination, decreased endurance, decreased knowledge of use of DME, decreased mobility, difficulty walking, decreased ROM, decreased strength, hypomobility, increased edema, increased fascial restrictions, increased muscle spasms, impaired flexibility, impaired sensation, impaired tone, impaired UE functional use, improper body mechanics, postural dysfunction, and pain.   ACTIVITY LIMITATIONS: carrying, lifting, bending, squatting, stairs, transfers, bed mobility, dressing, and locomotion level  PARTICIPATION LIMITATIONS: cleaning, laundry, interpersonal relationship, driving, shopping, community activity, occupation, and yard work  PERSONAL FACTORS: 1-2 comorbidities: hs of CHF and CVA are also affecting patient's functional outcome.   REHAB POTENTIAL: Good  CLINICAL DECISION MAKING: Evolving/moderate complexity  EVALUATION COMPLEXITY: Moderate  PLAN:  PT FREQUENCY: 1-2x/week  PT DURATION: 12 weeks  PLANNED INTERVENTIONS: 97110-Therapeutic exercises, 97530- Therapeutic activity, 97112- Neuromuscular re-education, 97535- Self Care, [id number]- Manual therapy, [phone]- Gait training, [phone]- Orthotic Fit/training, [phone]- Splinting, [phone]- Electrical stimulation (unattended), [phone]- Electrical stimulation (manual), Patient/Family education, Balance training, Stair training, Taping, Dry Needling, Joint mobilization, Joint manipulation, Spinal manipulation, Spinal  mobilization, Vestibular training, Visual/preceptual remediation/compensation, DME instructions, Cryotherapy, and Moist heat  PLAN FOR NEXT SESSION:   Continue Dynamic balance training.  R ankle and gluteal strengthening  HEP review   Note: Portions of this document were prepared using Dragon voice recognition software and although reviewed may contain unintentional dictation errors in syntax, grammar, or spelling.  Lonni KATHEE Gainer PT ,DPT Physical Therapist- [facility]   06/17/24, 2:28 PM

## 2024-06-17 NOTE — Therapy (Signed)
 OUTPATIENT OCCUPATIONAL THERAPY NEURO DISCHARGE NOTE   Patient Name: Anna Tapia MRN: [id number] DOB:[birth year], 33 y.o., female Today's Date: 06/20/2024  PCP: Dr. Sharyle Fischer  REFERRING PROVIDER: Dr. Sharyle Fischer  END OF SESSION:  OT End of Session - 06/20/24 2244     Visit Number 16    Number of Visits 25    Date for OT Re-Evaluation 08/12/24    Authorization Type [employer]; shara required    OT Start Time 1445    OT Stop Time 1530    OT Time Calculation (min) 45 min    Activity Tolerance Patient tolerated treatment well    Behavior During Therapy WFL for tasks assessed/performed         Past Medical History:  Diagnosis Date   Arteriovenous malformation of brain    a. s/p remote coiling.   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   Congenital CHF (congestive heart failure) (HCC)    a. 03/2023 Echo: EF 60-65%, no rwma, nl RV fxn, RVSP 29.41mmHg. Mild MR. Mild Ao sclerosis w/o stenosis. Ao root 38mm.   COVID 2022   Hemorrhagic stroke (HCC) 2023   a. L-sided midbrain, thalamic ICH in setting of PCA/ACA aneurysm s/p embolization.   History of broken collarbone 2020   Hydrocephalus (HCC)    a. s/p VP shunt in childhood.   Precordial chest pain    a. 03/2023 MV: No ischemia or infarct.  EF greater than 65%.  No significant coronary calcification.   Sepsis secondary to UTI (HCC) 2021   Past Surgical History:  Procedure Laterality Date   VENTRICULOPERITONEAL SHUNT     Patient Active Problem List   Diagnosis Date Noted   History of CVA with residual deficit 11/25/2023   CHF (congestive heart failure) (HCC) 03/25/2023   Left-sided nontraumatic intracerebral hemorrhage (HCC) 08/01/2022   AVM (arteriovenous malformation) brain 07/24/2022   Asthma 07/24/2022   Major depressive disorder, recurrent episode, moderate (HCC) 02/07/2022   Sepsis secondary to UTI (HCC) 05/01/2020   VP (ventriculoperitoneal) shunt status  05/01/2020   Right ovarian cyst 05/01/2020   Elevated LFTs 05/01/2020   ONSET DATE: Sept 2023  REFERRING DIAG: I69.30 (ICD-10-CM) - History of CVA with residual deficit   THERAPY DIAG:  Muscle weakness (generalized)  Other lack of coordination  History of CVA with residual deficit  Rationale for Evaluation and Treatment: Rehabilitation  SUBJECTIVE:  SUBJECTIVE STATEMENT: Pt reports being tired today due to having an earlier dentist appt this morning.     Pt accompanied by: self, aunt  PERTINENT HISTORY: Pt known to this clinic after participation in therapy in 2024 following her L CVA in 2023.  Pt was discharged from OT in Oct of 2024 d/t visit limitations with therapy.  Pt returns today to address RUE residual deficits from CVA after most recent Botox injections on 01/21/24.  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 Yes: NPRS scale: 4-5/10 R shoulder from recent Botox  Pain location: R shoulder  Pain description: achy Aggravating factors: Botox shot Relieving factors: heat, rest  FALLS: Has patient fallen in last 6 months? No  LIVING ENVIRONMENT: Lives with: lives with their family mother  and 3 brothers, but mostly staying with her aunt  Lives in: ground level apartment with aunt  Stairs: Yes: External: 3 steps; bilateral but cannot reach both Has following equipment at home: Single point cane  PLOF: Independent prior to CVA  PATIENT GOALS: Using my arm more.   OBJECTIVE:  Note: Objective measures were completed at Evaluation unless otherwise noted.  HAND DOMINANCE: Left  ADLs: Overall ADLs: Pt reports that she tries to engage the R hand into everything she does Transfers/ambulation  related to ADLs: indep Eating: cuts food with L hand Grooming: increased time to engage the R  UB Dressing: Increased time with clothing fasteners LB Dressing: wears slip on shoes; wore jeans today and did ok with the clothing fasteners, extra time  Toileting: indep Bathing: occasional pain when reaching over with the R arm to bathe the L arm (R pec and shoulder pain) Tub Shower transfers: modified indep Equipment: Shower seat with back, Grab bars, and hand held shower hose   IADLs: Shopping: pt can go shopping accompanied by a family member  Light housekeeping: pt reports unable to sweep; pt reports she gets dizzy  Meal Prep: Pt reports she now cooks about 3 nights a week, but has difficulty opening containers/jars/food packages in the [employer] mobility: uses cane Medication management: modified indep; increased time to engage the R hand Financial management: mother manages (pt states she doesn't really have any bills)  Handwriting: NT (pt is L hand dominant)  MOBILITY STATUS: Uses cane for longer distances, but did not bring cane today  POSTURE COMMENTS:  rounded shoulders Sitting balance: Moves/returns truncal midpoint >2 inches in all planes  ACTIVITY TOLERANCE: Activity tolerance: Pt fatigues with IADLs and community mobility  FUNCTIONAL OUTCOME MEASURES: Upper Extremity Functional Scale (UEFS): TBD  UPPER EXTREMITY ROM:    Active ROM Left  Eval WNL throughout Right OT d/c on 08/12/23 (Previous episode of care) Right Eval on 01/29/24  Right 04/15/24 Right 06/17/24  Shoulder flexion  133 (135) 125 (134) 125 (134) 140 (152)  Shoulder abduction  120 (125) 90 (104) 122 (125)   Shoulder adduction       Shoulder extension       Shoulder internal rotation  R thumb to lumbar spine (better clearance of SI joint) To R side of lower back-good clearance of SI joint To R side of mid back-good clearance of SI joint    Shoulder external rotation  Hand to back of head without  chin tuck and good abd  63* with arm abd (Slight scaption) 78*   Elbow flexion       Elbow extension       Wrist flexion       Wrist extension       Wrist ulnar deviation       Wrist radial deviation       Wrist pronation       Wrist supination       (Blank rows = not tested)  UPPER EXTREMITY MMT:     MMT Left Eval 5/5 throughout Right OT d/c on 08/12/23 (Previous episode of care) Right Eval 01/29/24 Right 04/15/24 Right 06/17/24:   Shoulder flexion  4- 4 4+ 4+  Shoulder abduction  4- 4 4 4   Shoulder adduction       Shoulder extension       Shoulder internal rotation  4 4 4+ 4+  Shoulder external rotation  4- 4 4 4   Middle trapezius  Lower trapezius       Elbow flexion  4+ 4+ 4+ 5  Elbow extension  5 5 5 5   Wrist flexion  4+ 4+ 4+ 4+  Wrist extension  4+ 4+ 4+ 4+  Wrist ulnar deviation       Wrist radial deviation       Wrist pronation       Wrist supination       (Blank rows = not tested)  HAND FUNCTION: 08/12/23: Grip strength: Right: 14 lbs, Left 60 lbs; lateral pinch: Right: 6 lbs, Left: 16 lbs , 3 point pinch: Right: 5 (IP flexion; Saehan pinch gauge), Left: 19 lbs  01/29/24 Eval: Grip strength: Right: 18 lbs; Left: 55 lbs, Lateral pinch: Right: 5 lbs, Left: 14 lbs, and 3 point pinch: Right:  Fingers slip- standard pinch gauge used 2 lbs, Left: 15 lbs 04/15/24: Grip strength: Right 28 lbs; Lateral pinch: Right: 8 lbs, 3 point pinch: 3 lbs (fingers slipped) 06/17/24: R grip strength: 36 lbs, Lateral pinch: Right: 8 lbs, 3 point pinch: (fingers slipped)   COORDINATION: 08/12/23: Right: 5 min and 19 sec; 2nd trial 3 min 49 sec, Left 23 sec,  01/29/24 Eval: 9 Hole Peg test: Right: 2 min 41 sec  sec; Left: 23 sec 04/15/24: Attempted/increased challenge and frustration with this today; will attempt next visit 04/22/24: 9 Hole Peg test: Right: 5 min 3 sec (when un-timed pt completes in ~ 1/2 the time) 05/20/24: 9 Hole Peg test: Right: >5 min to place 7 of 9 pegs with repeat  trials  06/17/24: 9 Hole Peg test: Right: unable to fully complete d/t R hand tremor/clonus; discontinued to limit frustration level  SENSATION: Light touch: Impaired , tingling in the R hand   EDEMA: No visible edema  MUSCLE TONE: RUE: Mild and Moderate (Last round of Botox on 01/21/24)  COGNITION: Overall cognitive status: Within functional limits for tasks assessed  VISION: WFL; no recent changes  PERCEPTION: WFL  PRAXIS: Impaired: Motor planning and Clonus R hand  OBSERVATIONS:  Pt remains very motivated to increase functional use of RUE for daily tasks.                                                                                                              TREATMENT DATE: 06/17/24 -Ktape applied to R hand digits 1-3, dorsal aspect, anchoring at the DIP and extending to the wrist and digit extensors on the dorsal forearm to manage flexor tone/promote digit extension in these digits.  Therapeutic Activity: -Objective measures taken and goals updated and reviewed for discharge summary. -HEP reviewed  PATIENT EDUCATION: Education details: HEP Person educated: Patient Education method: Explanation Education comprehension: verbalized understanding  HOME EXERCISE PROGRAM: Pink theraputty, self passive stretching for R hand digit ext/thumb flex/abd Deck of cards: shuffling, dealing, sorting/flipping  GOALS: Goals reviewed with patient? Yes  SHORT TERM GOALS: Target date: 03/11/24  Pt will be indep to perform HEP for improving R hand strength and coordination for daily tasks. Baseline: Eval: Not yet initiated; 04/15/24:  indep with theraputty and RUE stretching Goal status: achieved   LONG TERM GOALS: Target date: 08/12/24  Pt will increase UEFS by 10 or more points to indicate self perceived functional improvement when engaging the RUE into daily tasks. Baseline: Eval: TBD; 04/15/24: D/c/ NT Goal status: d/c  2.  Pt will increase R grip strength by 5 or more lbs to  securely stabilize containers and jars for easier opening.  Baseline: Eval: R grip strength 18 lbs (L 55 lbs); 04/15/24: R grip strength 28 lbs; 06/17/24: Improved, not easy but better, and fingers don't slip as much when opening containers/jars/packages Goal status: exceeded/achieved  3.  Pt will increase R hand dexterity/FMC skills to manipulate clothing fasteners with bilat hands.  Baseline: Eval: Unable to hook bra and manages buttons with L hand only; 04/15/24: now able to hook bra bimanually, able to wear button up shirt today with extra time to fasten; multiple attempts at 9 hole peg test today before test terminated d/t frustration; 06/17/24: unable to fully complete 9 hole peg test d/t R hand tremor/clonus, but pt is able to manage clothing fasteners bimanually Goal status: achieved  4.  Pt will increase R active shoulder flexion to 140* or better to achieve functional ROM for UB ADLs and reaching for ADL supplies.  Baseline: Eval: R shoulder flexion 125*; difficulty reaching overhead for ADL supplies; 04/15/24: 125 (134); focus has been on R hand dexterity skills in most sessions per pt request; but pt reports consistent stretching at home; 06/17/24: R shoulder flexion 140 (152) Goal status: achieved  ASSESSMENT:  CLINICAL IMPRESSION: Pt seen for OT d/c this date as a result of insurance limitations.  Pt has been seen by OT for 16 sessions and has demonstrated good improvements in R hand grip strength, R shoulder A/PROM allowing greater reach for UB ADLs and overhead reaching.  R hand intrinsic strength demonstrates improvements as noted by pt's ability to maintain a light 3 point pinch and a lumbrical grasp to grasp light ADL supplies without digits 1-3 slipping into immediate flexion at the PIP and DIP joints.  Pt has found that applying Ktape to dorsal aspect of these digits has further helped to minimize flexor tone to aid in above noted grasp patterns.  Pt has increased participation in  meal prep and reports consistent attempts at engaging the RUE into daily tasks.  R hand tremor/clonus, and RUE hemiparesis remains a limiting factor in efficient engagement of the RUE with fine motor components of ADLs.  Pt is indep with HEP and remains adherent.  Ongoing HEP and continued focus on bimanual task completion is recommended to promote improved R hand FMC and strengthening for daily tasks.  Pt verbalizes understanding of d/c recommendations.   PERFORMANCE DEFICITS: in functional skills including ADLs, IADLs, coordination, dexterity, sensation, tone, ROM, strength, pain, flexibility, Fine motor control, Gross motor control, mobility, balance, body mechanics, endurance, decreased knowledge of use of DME, and UE functional use, and psychosocial skills including coping strategies, environmental adaptation, habits, and routines and behaviors.   IMPAIRMENTS: are limiting patient from ADLs, IADLs, work, leisure, and social participation.   CO-MORBIDITIES: has co-morbidities such as CHF, asthma, depression that affects occupational performance. Patient will benefit from skilled OT to address above impairments and improve overall function.  MODIFICATION OR ASSISTANCE TO COMPLETE EVALUATION: No modification of tasks or assist necessary to complete an evaluation.  OT OCCUPATIONAL PROFILE AND HISTORY: Detailed assessment: Review of records and additional review of physical, cognitive, psychosocial history related  to current functional performance.  CLINICAL DECISION MAKING: Moderate - several treatment options, min-mod task modification necessary  REHAB POTENTIAL: Good  EVALUATION COMPLEXITY: Moderate    PLAN:  OT FREQUENCY: 1-2x/week  OT DURATION: 12 weeks  PLANNED INTERVENTIONS: 97168 OT Re-evaluation, 97535 self care/ADL training, 02889 therapeutic exercise, 97530 therapeutic activity, 97112 neuromuscular re-education, 97140 manual therapy, 97010 moist heat, 97010 cryotherapy, passive  range of motion, balance training, functional mobility training, psychosocial skills training, energy conservation, coping strategies training, patient/family education, and DME and/or AE instructions  RECOMMENDED OTHER SERVICES: Pt completed PT evaluation this date  CONSULTED AND AGREED WITH PLAN OF CARE: Patient  PLAN FOR NEXT SESSION: N/A; d/c this visit   Inocente Blazing, MS, OTR/L  Inocente MARLA Blazing, OT 06/20/2024, 10:46 PM

## 2024-06-18 NOTE — Telephone Encounter (Signed)
 Requested Prescriptions  Pending Prescriptions Disp Refills   baclofen  (LIORESAL ) 10 MG tablet [Pharmacy Med Name: Baclofen  10 MG Oral Tablet] 180 tablet 1    Sig: TAKE ONE-HALF TABLET BY MOUTH IN THE MORNING, ONE TABLET AT LUNCH, AND 2 TABLETS AT NIGHT     Analgesics:  Muscle Relaxants - baclofen  Passed - 06/18/2024 12:19 PM      Passed - Cr in normal range and within 180 days    Creat  Date Value Ref Range Status  04/02/2024 0.61 0.50 - 0.97 mg/dL Final   Creatinine, Ser  Date Value Ref Range Status  05/20/2024 0.64 0.44 - 1.00 mg/dL Final         Passed - eGFR is 30 or above and within 180 days    EGFR (African American)  Date Value Ref Range Status  09/19/2014 >60 >17mL/min Final   GFR calc Af Amer  Date Value Ref Range Status  05/06/2020 >60 >60 mL/min Final   EGFR (Non-African Amer.)  Date Value Ref Range Status  09/19/2014 >60 >56mL/min Final    Comment:    eGFR values <60mL/min/1.73 m2 may be an indication of chronic kidney disease (CKD). Calculated eGFR, using the MRDR Study equation, is useful in  patients with stable renal function. The eGFR calculation will not be reliable in acutely ill patients when serum creatinine is changing rapidly. It is not useful in patients on dialysis. The eGFR calculation may not be applicable to patients at the low and high extremes of body sizes, pregnant women, and vegetarians.    GFR, Estimated  Date Value Ref Range Status  05/20/2024 >60 >60 mL/min Final    Comment:    (NOTE) Calculated using the CKD-EPI Creatinine Equation (2021)    eGFR  Date Value Ref Range Status  04/02/2024 121 > OR = 60 mL/min/1.85m2 Final         Passed - Valid encounter within last 6 months    Recent Outpatient Visits           3 weeks ago Hypertension, unspecified type   [facility] Bernardo Fend, DO   2 months ago Annual physical exam   [facility] Bernardo Fend, DO   5  months ago Exposure to the flu   [facility] Bernardo Fend, DO       Future Appointments             In 4 days Bernardo Fend, DO [facility]   In 3 months Bernardo Fend, DO [facility], [facility]

## 2024-06-19 DIAGNOSIS — Z7689 Persons encountering health services in other specified circumstances: Secondary | ICD-10-CM | POA: Diagnosis not present

## 2024-06-21 ENCOUNTER — Telehealth: Payer: Self-pay | Admitting: *Deleted

## 2024-06-21 NOTE — Progress Notes (Signed)
 Complex Care Management Note  Care Guide Note 06/21/2024 Name: [name] MRN: [id number] DOB: [birth year]  Anna Tapia is a 33 y.o. year old female who sees Bernardo Fend, DO for primary care. I reached out to Anna Tapia by phone today to offer complex care management services.  [name] was given information about Complex Care Management services today including:   The Complex Care Management services include support from the care team which includes your Nurse Care Manager, Clinical Social Worker, or Pharmacist.  The Complex Care Management team is here to help remove barriers to the health concerns and goals most important to you. Complex Care Management services are voluntary, and the patient may decline or stop services at any time by request to their care team member.   Complex Care Management Consent Status: Patient agreed to services and verbal consent obtained.   Follow up plan:  Telephone appointment with complex care management team member scheduled for:  06/28/2024 and 07/02/2024  Encounter Outcome:  Patient Scheduled  Thedford Franks, CMA [place]  [facility] Guide Direct Dial: [phone]  Fax: [phone] Website: [email or web address]

## 2024-06-22 ENCOUNTER — Ambulatory Visit (INDEPENDENT_AMBULATORY_CARE_PROVIDER_SITE_OTHER): Admitting: Internal Medicine

## 2024-06-22 ENCOUNTER — Encounter: Payer: Self-pay | Admitting: Internal Medicine

## 2024-06-22 ENCOUNTER — Other Ambulatory Visit: Payer: Self-pay

## 2024-06-22 VITALS — BP 124/76 | HR 97 | Temp 98.2°F | Resp 16 | Ht 60.0 in | Wt 173.8 lb

## 2024-06-22 DIAGNOSIS — I10 Essential (primary) hypertension: Secondary | ICD-10-CM

## 2024-06-22 DIAGNOSIS — Z7689 Persons encountering health services in other specified circumstances: Secondary | ICD-10-CM | POA: Diagnosis not present

## 2024-06-22 DIAGNOSIS — I6930 Unspecified sequelae of cerebral infarction: Secondary | ICD-10-CM

## 2024-06-22 MED ORDER — LISINOPRIL 5 MG PO TABS: 5.0000 mg | ORAL_TABLET | Freq: Every day | ORAL | 1 refills | Status: DC

## 2024-06-22 NOTE — Progress Notes (Signed)
 Established Patient Office Visit  Subjective    Patient ID: Anna Tapia, female    DOB: Dec 30, 1990  Age: 33 y.o. MRN: [id number]  CC:  Chief Complaint  Patient presents with   Hypertension    4 week recheck    HPI CHIZUKO TRINE presents for recheck of blood pressure.   Discussed the use of AI scribe software for clinical note transcription with the patient, who gave verbal consent to proceed.  History of Present Illness Anna Tapia is a 33 year old female with hypertension and a history of hemorrhagic stroke who presents for follow-up of blood pressure management and stroke-related symptoms.  Her blood pressure is well-controlled with lisinopril , with recent readings around 124/76 and 120/82. She experiences a dry cough in the morning, likely due to lisinopril , but it is not bothersome enough to change medication.  Since her hemorrhagic stroke nearly two years ago, she experiences weakness and a heavy feeling in her arm, with sensation intact but feeling heavy. She is concerned about regaining full function given the time since the stroke.  Her cholesterol was slightly elevated in May, and she is not currently on cholesterol medication.  She is vaccinated for pneumonia and is up to date on her HPV vaccinations, with the next dose due at a future visit. She has no headaches, fatigue, or shortness of breath.    Outpatient Encounter Medications as of 06/22/2024  Medication Sig   albuterol  (VENTOLIN  HFA) 108 (90 Base) MCG/ACT inhaler Inhale 2 puffs into the lungs every 6 (six) hours as needed for wheezing or shortness of breath.   baclofen  (LIORESAL ) 10 MG tablet TAKE ONE-HALF TABLET BY MOUTH IN THE MORNING, ONE TABLET AT LUNCH, AND 2 TABLETS AT NIGHT   DULoxetine  (CYMBALTA ) 20 MG capsule Take 2 capsules by mouth once daily   famotidine  (PEPCID ) 20 MG tablet Take 1 tablet (20 mg total) by mouth 2 (two) times daily.   fluticasone -salmeterol (ADVAIR)  100-50 MCG/ACT AEPB Inhale 1 puff into the lungs 2 (two) times daily.   lisinopril  (ZESTRIL ) 5 MG tablet Take 1 tablet (5 mg total) by mouth daily.   pregabalin (LYRICA) 50 MG capsule Take 50 mg by mouth 2 (two) times daily.   Blood Pressure Monitoring (ADULT BLOOD PRESSURE CUFF LG) KIT 1 each by Does not apply route daily.   No facility-administered encounter medications on file as of 06/22/2024.    Past Medical History:  Diagnosis Date   Arteriovenous malformation of brain    a. s/p remote coiling.   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   Congenital CHF (congestive heart failure) (HCC)    a. 03/2023 Echo: EF 60-65%, no rwma, nl RV fxn, RVSP 29.62mmHg. Mild MR. Mild Ao sclerosis w/o stenosis. Ao root 38mm.   COVID 2022   Hemorrhagic stroke (HCC) 2023   a. L-sided midbrain, thalamic ICH in setting of PCA/ACA aneurysm s/p embolization.   History of broken collarbone 2020   Hydrocephalus (HCC)    a. s/p VP shunt in childhood.   Precordial chest pain    a. 03/2023 MV: No ischemia or infarct.  EF greater than 65%.  No significant coronary calcification.   Sepsis secondary to UTI [facility]) 2021    Past Surgical History:  Procedure Laterality Date   VENTRICULOPERITONEAL SHUNT      Family History  Problem Relation Age of Onset   Seizures Mother    Hypertension  Mother    Diabetes Mother    Cancer Maternal Aunt    Cancer Maternal Uncle     Social History   Socioeconomic History   Marital status: Single    Spouse name: Not on file   Number of children: Not on file   Years of education: Not on file   Highest education level: GED or equivalent  Occupational History   Not on file  Tobacco Use   Smoking status: Former    Current packs/day: 0.00    Types: E-cigarettes, Cigarettes    Start date: 2007    Quit date: 2021    Years since quitting: 4.6    Passive exposure: Past   Smokeless tobacco: Never  Vaping Use   Vaping status: Former   Substance and Sexual Activity   Alcohol use: Not Currently   Drug use: Never   Sexual activity: Not Currently    Partners: Male    Birth control/protection: Abstinence  Other Topics Concern   Not on file  Social History Narrative   Not on file   Social Drivers of Health   Financial Resource Strain: Medium Risk (06/18/2024)   Overall Financial Resource Strain (CARDIA)    Difficulty of Paying Living Expenses: Somewhat hard  Food Insecurity: Food Insecurity Present (06/18/2024)   Hunger Vital Sign    Worried About Running Out of Food in the Last Year: Sometimes true    Ran Out of Food in the Last Year: Sometimes true  Transportation Needs: No Transportation Needs (06/18/2024)   PRAPARE - Administrator, Civil Service (Medical): No    Lack of Transportation (Non-Medical): No  Physical Activity: Insufficiently Active (06/18/2024)   Exercise Vital Sign    Days of Exercise per Week: 2 days    Minutes of Exercise per Session: 20 min  Stress: Stress Concern Present (06/18/2024)   [employer] of Occupational Health - Occupational Stress Questionnaire    Feeling of Stress: To some extent  Social Connections: Socially Isolated (06/18/2024)   Social Connection and Isolation Panel    Frequency of Communication with Friends and Family: Three times a week    Frequency of Social Gatherings with Friends and Family: Once a week    Attends Religious Services: Never    Database administrator or Organizations: No    Attends Engineer, structural: Not on file    Marital Status: Never married  Intimate Partner Violence: Not At Risk (04/02/2024)   Humiliation, Afraid, Rape, and Kick questionnaire    Fear of Current or Ex-Partner: No    Emotionally Abused: No    Physically Abused: No    Sexually Abused: No    Review of Systems  Constitutional:  Negative for chills and fever.  Eyes:  Negative for blurred vision.  Respiratory:  Positive for cough. Negative for sputum  production and shortness of breath.   Cardiovascular:  Negative for chest pain.  Neurological:  Negative for dizziness and headaches.        Objective    BP 124/76 (Cuff Size: Large)   Pulse 97   Temp 98.2 F (36.8 C) (Oral)   Resp 16   Ht 5' (1.524 m)   Wt 173 lb 12.8 oz (78.8 kg)   SpO2 98%   BMI 33.94 kg/m   Physical Exam Constitutional:      Appearance: Normal appearance.  HENT:     Head: Normocephalic and atraumatic.  Eyes:     Conjunctiva/sclera: Conjunctivae normal.  Cardiovascular:  Rate and Rhythm: Normal rate and regular rhythm.  Pulmonary:     Effort: Pulmonary effort is normal.     Breath sounds: Normal breath sounds.  Skin:    General: Skin is warm and dry.  Neurological:     Mental Status: She is alert. Mental status is at baseline.     Comments: Tremor in thumb and 2-4th digits in right hand  Psychiatric:        Mood and Affect: Mood normal.        Behavior: Behavior normal.         Assessment & Plan:   Assessment & Plan Hypertension Hypertension is well-controlled with current medication. Blood pressure consistently within target range. - Continue lisinopril  5 mg as prescribed. - Advise to monitor blood pressure twice weekly and during symptoms such as headache, fatigue, or shortness of breath. - Schedule follow-up in six months unless symptoms arise.  Lisinopril -induced cough Experiences a dry cough in the morning due to lisinopril , not bothersome. Prefers to continue medication. - Continue lisinopril  despite cough. - Discuss potential switch to losartan if cough becomes bothersome. - Advise to monitor for changes in cough, especially if it becomes productive or occurs at other times.  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 Recovery uncertain after two years. - Encourage continuation of physical therapy and exercises to improve arm  function. - Discuss possibility of permanent deficits given time elapsed since stroke.   Return in about 6 months (around 12/23/2024).   Sharyle Fischer, DO

## 2024-06-24 ENCOUNTER — Ambulatory Visit

## 2024-06-24 ENCOUNTER — Ambulatory Visit: Admitting: Physical Therapy

## 2024-06-24 DIAGNOSIS — R262 Difficulty in walking, not elsewhere classified: Secondary | ICD-10-CM

## 2024-06-24 DIAGNOSIS — R2689 Other abnormalities of gait and mobility: Secondary | ICD-10-CM

## 2024-06-24 DIAGNOSIS — R2681 Unsteadiness on feet: Secondary | ICD-10-CM

## 2024-06-24 DIAGNOSIS — R278 Other lack of coordination: Secondary | ICD-10-CM | POA: Diagnosis not present

## 2024-06-24 DIAGNOSIS — M6281 Muscle weakness (generalized): Secondary | ICD-10-CM | POA: Diagnosis not present

## 2024-06-24 DIAGNOSIS — Z7689 Persons encountering health services in other specified circumstances: Secondary | ICD-10-CM | POA: Diagnosis not present

## 2024-06-24 DIAGNOSIS — I6930 Unspecified sequelae of cerebral infarction: Secondary | ICD-10-CM

## 2024-06-24 NOTE — Therapy (Signed)
 OUTPATIENT PHYSICAL THERAPY NEURO TREATMENT  Patient Name: Anna Tapia MRN: [id number] DOB:[birth year], 33 y.o., female Today's Date: 06/24/2024   PCP: Bernardo Fend, DO  REFERRING PROVIDER: Bernardo Fend, DO   END OF SESSION:  PT End of Session - 06/24/24 1537     Visit Number 13    Number of Visits 24    Date for PT Re-Evaluation 08/19/24    PT Start Time 1537    PT Stop Time 1615    PT Time Calculation (min) 38 min    Equipment Utilized During Treatment Gait belt    Activity Tolerance Patient tolerated treatment well;No increased pain    Behavior During Therapy WFL for tasks assessed/performed             Past Medical History:  Diagnosis Date   Arteriovenous malformation of brain    a. s/p remote coiling.   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   Congenital CHF (congestive heart failure) (HCC)    a. 03/2023 Echo: EF 60-65%, no rwma, nl RV fxn, RVSP 29.65mmHg. Mild MR. Mild Ao sclerosis w/o stenosis. Ao root 38mm.   COVID 2022   Hemorrhagic stroke (HCC) 2023   a. L-sided midbrain, thalamic ICH in setting of PCA/ACA aneurysm s/p embolization.   History of broken collarbone 2020   Hydrocephalus (HCC)    a. s/p VP shunt in childhood.   Precordial chest pain    a. 03/2023 MV: No ischemia or infarct.  EF greater than 65%.  No significant coronary calcification.   Sepsis secondary to UTI (HCC) 2021   Past Surgical History:  Procedure Laterality Date   VENTRICULOPERITONEAL SHUNT     Patient Active Problem List   Diagnosis Date Noted   History of CVA with residual deficit 11/25/2023   CHF (congestive heart failure) (HCC) 03/25/2023   Left-sided nontraumatic intracerebral hemorrhage (HCC) 08/01/2022   AVM (arteriovenous malformation) brain 07/24/2022   Asthma 07/24/2022   Major depressive disorder, recurrent episode, moderate (HCC) 02/07/2022   Sepsis secondary to UTI (HCC) 05/01/2020   VP  (ventriculoperitoneal) shunt status 05/01/2020   Right ovarian cyst 05/01/2020   Elevated LFTs 05/01/2020    ONSET DATE: September  2023  REFERRING DIAG:  Diagnosis  I69.30 (ICD-10-CM) - History of CVA with residual deficit    THERAPY DIAG:  Muscle weakness (generalized)  Other lack of coordination  History of CVA with residual deficit  Difficulty in walking, not elsewhere classified  Unsteadiness on feet  Other abnormalities of gait and mobility  Balance disorder  Rationale for Evaluation and Treatment: Rehabilitation  SUBJECTIVE:  SUBJECTIVE STATEMENT:  Pt reports doing well today. Pt denies any recent falls/stumbles since prior session. Pt denies any updates to medications or medical appointment since prior session. Pt reports good compliance with HEP when time permits. Pt is excited to come to stroke support group next week.     Pt accompanied by: self  PERTINENT HISTORY:  From recent MD visit:  Anna Tapia presents to follow up on chronic medical conditions. Having some knee pain today, had been doing well with PT but her insurance is no longer paying for this until the new year.    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  Also on Lyrica 50 mg BID  -Difficulty using using right hand and right side of face - completed home PT/OT but interested in continuing with therapy outside the home.  Hx of CHF as a newborn: -Had been following  with Cardiology at [facility], last seen in 2018 -Last echo 9/23 EF 58% -Denies chest pain, palpitations, shortness of breath or lower extremity swelling  PAIN:  Are you having pain? Yes: NPRS scale: 4/10 Pain location: R shoulder  Pain description: dull pain  Aggravating factors: moving arm  Relieving factors: n/a   PRECAUTIONS: Fall  RED FLAGS: None   WEIGHT BEARING RESTRICTIONS: No  FALLS: Has patient fallen in last 6 months? No  LIVING ENVIRONMENT: Lives with: lives with their family Lives in: House/apartment Stairs: Yes: External: 2 steps; on right going up and on left going up Has following equipment at home: Single point cane  PLOF: Independent, Independent with basic ADLs, and prior to CVA   PATIENT GOALS: improve walking. Get around better   OBJECTIVE:  Note: Objective measures were completed at Evaluation unless otherwise noted.  DIAGNOSTIC FINDINGS:   CT 2023 IMPRESSION: 1. 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 Again, prominent arterial contribution to the AVM is seen from the left ACA and posterior circulation, with primary venous drainage into the deep venous system. Associated 7 mm aneurysm along the right anterolateral aspect of the AVM as above. 2. Diffuse tortuosity and ectasia elsewhere about the major arterial vasculature of the head and neck. No large vessel occlusion or hemodynamically significant stenosis. No other acute vascular abnormality.    COGNITION: Overall cognitive status: Within functional limits for tasks assessed   SENSATION: Light touch: Impaired  tight feeling on the R foot due to swelling   COORDINATION: Spastic hemiplegia on the R side.   EDEMA:  Circumferential:  distal to knee in sitting R: 41.6CM L 39cm  MUSCLE TONE: RLE: Mild noted increased tone with gait and mobility.   DTRs:  Patella 1 = Trace   POSTURE: rounded shoulders, forward head, and left pelvic  obliquity  LOWER EXTREMITY ROM:     Active  Right Eval Left Eval  Hip flexion [facility]  Hip extension [facility]  Hip abduction    Hip adduction    Hip internal rotation    Hip external rotation    Knee flexion 110 WFL  Knee extension [facility]  Ankle dorsiflexion 8 15  Ankle plantarflexion WF  WFL  Ankle inversion    Ankle eversion     (Blank rows = not tested)  LOWER EXTREMITY MMT:    03/25/24 MMT Right Eval Left Eval  Hip flexion 4+ 4+  Hip extension    Hip abduction 5 5  Hip adduction 5 5  Hip internal rotation    Hip external rotation    Knee flexion 4+ 5  Knee extension 5 5  Ankle dorsiflexion 4 5  Ankle plantarflexion    Ankle inversion    Ankle eversion    (Blank rows = not tested)  BED MOBILITY:  Sit to supine SBA Supine to sit SBA Rolling to Right SBA Rolling to Left SBA  TRANSFERS: Assistive device utilized: None  Sit to stand: SBA Stand to sit: SBA Chair to chair: SBA Floor: need to assess  CURB:  Level of Assistance: Modified independence and SBA Assistive device utilized: rail  Curb Comments: step to descent   STAIRS: Level of Assistance: Modified independence Stair Negotiation Technique: Step to Pattern with descent and Single Rail on Left Number of Stairs: 4  Height of Stairs: 6  Comments: mild posterior bias on descent. And step to pattern   GAIT: Gait pattern: step through pattern, decreased stance time- Right, Right hip hike, trunk rotated posterior- Left, wide BOS, and poor foot clearance- Right Distance walked: 60 Assistive device utilized: None Level of assistance: Modified independence and SBA Comments: see description  FUNCTIONAL TESTS:  See Goal assessment.   PATIENT SURVEYS:  ABC scale 48.75  See goals assessment                                                                                                                              TREATMENT DATE: 06/24/24  Nustep x 6 min level 1-4 with cues for full ROM on the  RUE and to maintain consistent SPM through varied resistance.   Obstacle course management over red mat, across airex beam, up down 4inch step,  over 2 hurdles. While weaving through rehab exercise machines for 81ft x 3 CW and x 3 CCW. CGA for safety except on airex pad, requiring min assist for safety. Able to improve stair management to progress from step to on descent to step through with UE support   Side stepping over 2 hurdles. X 6 bil with cues for step length and decreased force of heel contact on the LLE to improve gluteal activation on the RLE.   Forward.reverse over level ground 13ft x 4  Forward/reverse over med mat 52ft x 2  Cues for improved step length on the RLE to improve step symmetry in reverse.   Multiple rest breaks required throughout session due to mild SOB and BLE fatigue     PATIENT EDUCATION: Education details: POC.  Person educated: Patient Education method: Medical illustrator Education comprehension: verbalized understanding  HOME EXERCISE PROGRAM: Access Code: [email or web address] URL: [email or web address] Date: 02/19/2024 Prepared by: Massie Dollar  Exercises - Seated  March with Ankle Weights at Foot  - 1 x daily - 7 x weekly - 3 sets - 10 reps - Side Stepping with Resistance at Feet  - 1 x daily - 7 x weekly - 3 sets - 10 reps - Tandem Stance  - 1 x daily - 5 x weekly - 3 sets - 4 reps - 20 hold - Staggered Sit-to-Stand  - 1 x daily - 5 x weekly - 2 sets - 5 reps - Sit to Stand with Arms Crossed  - 1 x daily - 5 x weekly - 3 sets - 10 reps - Seated Knee Extension with Resistance  - 1 x daily - 7 x weekly - 3 sets - 10 reps - Seated Hip Abduction with Resistance  - 1 x daily - 7 x weekly - 3 sets - 10 reps - Standing Single Leg Stance with Counter Support  - 1 x daily - 7 x weekly - 3 sets - 10 reps  GOALS: Goals reviewed with patient? Yes  SHORT TERM GOALS: Target date: 07/01/2024  Patient will be independent in home exercise program  to improve strength/mobility for better functional independence with ADLs. Baseline: to be given on visit 2  5/:22: HEP provided and reviewed since 4/17 7/24: reports that she has not complete for ~ 1 MONTH - reprinted on this day  .    Goal status: IN PROGRESS  LONG TERM GOALS: Target date: 08/19/2024  Patient will increase ABC score to equal to or greater than   10%  to demonstrate statistically significant improvement in mobility and quality of life.  Baseline: 48.75 5/22: 62.5% 7/24: 56.25%  Goal status: IN PROGRESS  2.  Patient (> 71 years old) will complete five times sit to stand test in < 11 seconds indicating an increased LE strength and improved balance. Baseline: 12.41sec 5/22: 11.91sec without UE support average of 2 trials. 7/24: 14.34 sec  Goal status: IN PROGRESS  3.  Patient will increase 6 min walk test to >1296ft to demonstrate decreased fall risk during functional activities with community mobility/  Baseline: 926ft without AD. No rest break needed.  5/22: 92ft without AD no rest break required.  7/24: 1068 no AD> no rest breaks  Goal status: IN PROGRESS  4.  Patient will increase 10 meter walk test to >1.24m/s as to improve gait speed for better community ambulation and to reduce fall risk. Baseline: 0.65m/s 5/22:Average Normal speed: 0.87 m/s; Average Fast speed: 1.03 m/s 7/24: 0.20m/s no AD  Goal status: IN PROGRESS  5.  Patient will reduce timed up and go to <10 seconds to reduce fall risk and demonstrate improved transfer/gait ability. Baseline: 10.94 5/22: 9.08 sec without UE support  7/24: 12.86 sec without AD   Goal status: IN PROGRESS  6.  Patient will increase dynamic gait index score to >24 as to demonstrate reduced fall risk and improved dynamic gait balance for better safety with community/home ambulation.   Baseline: 20; 06/03/2024= 20 Goal status: IN PROGRESS   ASSESSMENT:  CLINICAL IMPRESSION:   Patient continues to progress with  activity tolerance training with variable gait training to simulate community access including unlevel surface, stairs and obstacle navigation. Was able to perform step through gait pattern on stair on this day with descent as well as safe management of reverse gait on unlevel surface, but still demonstrating difficulty with step symmetry in reverse.  Pt will continue to benefit from skilled therapy to address remaining deficits in order to  improve overall QoL and return to PLOF.       OBJECTIVE IMPAIRMENTS: Abnormal gait, cardiopulmonary status limiting activity, decreased activity tolerance, decreased balance, decreased coordination, decreased endurance, decreased knowledge of use of DME, decreased mobility, difficulty walking, decreased ROM, decreased strength, hypomobility, increased edema, increased fascial restrictions, increased muscle spasms, impaired flexibility, impaired sensation, impaired tone, impaired UE functional use, improper body mechanics, postural dysfunction, and pain.   ACTIVITY LIMITATIONS: carrying, lifting, bending, squatting, stairs, transfers, bed mobility, dressing, and locomotion level  PARTICIPATION LIMITATIONS: cleaning, laundry, interpersonal relationship, driving, shopping, community activity, occupation, and yard work  PERSONAL FACTORS: 1-2 comorbidities: hs of CHF and CVA are also affecting patient's functional outcome.   REHAB POTENTIAL: Good  CLINICAL DECISION MAKING: Evolving/moderate complexity  EVALUATION COMPLEXITY: Moderate  PLAN:  PT FREQUENCY: 1-2x/week  PT DURATION: 12 weeks  PLANNED INTERVENTIONS: 97110-Therapeutic exercises, 97530- Therapeutic activity, 97112- Neuromuscular re-education, 97535- Self Care, 02859- Manual therapy, [phone]- Gait training, [phone]- Orthotic Fit/training, [phone]- Splinting, [phone]- Electrical stimulation (unattended), [phone]- Electrical stimulation (manual), Patient/Family education, Balance training, Stair training, Taping,  Dry Needling, Joint mobilization, Joint manipulation, Spinal manipulation, Spinal mobilization, Vestibular training, Visual/preceptual remediation/compensation, DME instructions, Cryotherapy, and Moist heat  PLAN FOR NEXT SESSION:   Continue Dynamic balance training.  R ankle and gluteal strengthening  HEP review    Massie FORBES Dollar PT ,DPT Physical Therapist- [facility]   06/24/24, 4:12 PM

## 2024-06-28 ENCOUNTER — Other Ambulatory Visit: Payer: Self-pay

## 2024-06-28 NOTE — Patient Outreach (Signed)
 Complex Care Management   Visit Note  06/28/2024  Name:  Anna Tapia MRN: [id number] DOB: [birth year]  Situation: Referral received for Complex Care Management related to SDOH Barriers:  Food insecurity I obtained verbal consent from Patient.  Visit completed with Patient  on the phone  Background:   Past Medical History:  Diagnosis Date   Arteriovenous malformation of brain    a. s/p remote coiling.   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   Congenital CHF (congestive heart failure) (HCC)    a. 03/2023 Echo: EF 60-65%, no rwma, nl RV fxn, RVSP 29.46mmHg. Mild MR. Mild Ao sclerosis w/o stenosis. Ao root 38mm.   COVID 2022   Hemorrhagic stroke (HCC) 2023   a. L-sided midbrain, thalamic ICH in setting of PCA/ACA aneurysm s/p embolization.   History of broken collarbone 2020   Hydrocephalus (HCC)    a. s/p VP shunt in childhood.   Precordial chest pain    a. 03/2023 MV: No ischemia or infarct.  EF greater than 65%.  No significant coronary calcification.   Sepsis secondary to UTI [facility]) 2021    Assessment: SW completed a telephone outreach with patient, she states she needs assistance with food. She does receive foodstamps and it does not last her the whole month. Patient states she does not have any income and unable to work. SW and patient agreed for food resources to be emailed to address on file. SW and patient spoke about pest control. She states she does have someone that is supposed to come out she is currently waiting on a call from them.  SDOH Interventions    Flowsheet Row Patient Outreach Telephone from 06/28/2024 in [facility] Office Visit from 07/10/2023 in [facility] Office Visit from 02/24/2023 in [facility]  SDOH Interventions     Food Insecurity Interventions Community Resources Provided -- --  Depression Interventions/Treatment  -- Medication  Medication    Recommendation:   No recommendations at this time   Follow Up Plan:   Telephone follow-up on 07/19/24 at 11am  Thersia Hoar, BSW, [facility]  [facility] Social Worker, Population Health [phone]

## 2024-06-28 NOTE — Patient Instructions (Signed)
 Visit Information  Anna Tapia was given information about Medicaid Managed Care team care coordination services as a part of their [facility] Medicaid benefit.   If you would like to schedule transportation through your [employer] plan, please call the following number at least 2 days in advance of your appointment: [phone].   You can also use the MTM portal or MTM mobile app to manage your rides. Reimbursement for transportation is available through [facility]! For the portal, please go to [email or web address]  Call the [facility] Crisis Line at [phone], at any time, 24 hours a day, 7 days a week. If you are in danger or need immediate medical attention call 911.   Anna Tapia - following are the goals we discussed in your visit today:   Goals Addressed             This Visit's Progress    BSW VBCI Social Work Care Plan       Problems:   Food Insecurity   CSW Clinical Goal(s):   Over the next 30 days the Patient will will follow up with food resources as directed by Social Work.  Interventions:  SW emailed food resources to email address on file.  Patient Goals/Self-Care Activities:  Patient will contact food resources  Plan:   The care management team will reach out to the patient again over the next 15 days.         Social Worker will follow up on 07/19/24 at 11am.   Anna Tapia, BSW, MHA [place]  [facility] Social Worker, Population Health [phone]   Following is a copy of your plan of care:  There are no care plans that you recently modified to display for this patient.

## 2024-07-01 ENCOUNTER — Ambulatory Visit: Admitting: Physical Therapy

## 2024-07-01 ENCOUNTER — Ambulatory Visit

## 2024-07-01 DIAGNOSIS — M6281 Muscle weakness (generalized): Secondary | ICD-10-CM

## 2024-07-01 DIAGNOSIS — R262 Difficulty in walking, not elsewhere classified: Secondary | ICD-10-CM

## 2024-07-01 DIAGNOSIS — R2681 Unsteadiness on feet: Secondary | ICD-10-CM

## 2024-07-01 DIAGNOSIS — I6930 Unspecified sequelae of cerebral infarction: Secondary | ICD-10-CM | POA: Diagnosis not present

## 2024-07-01 DIAGNOSIS — R2689 Other abnormalities of gait and mobility: Secondary | ICD-10-CM

## 2024-07-01 DIAGNOSIS — R278 Other lack of coordination: Secondary | ICD-10-CM | POA: Diagnosis not present

## 2024-07-01 NOTE — Therapy (Unsigned)
 OUTPATIENT PHYSICAL THERAPY NEURO TREATMENT  Patient Name: Anna Tapia MRN: [id number] DOB:1991-03-30, 33 y.o., female Today's Date: 07/01/2024   PCP: Bernardo Fend, DO  REFERRING PROVIDER: Bernardo Fend, DO   END OF SESSION:  PT End of Session - 07/01/24 1532     Visit Number 14    Number of Visits 24    Date for PT Re-Evaluation 08/19/24    PT Start Time 1535    PT Stop Time 1615    PT Time Calculation (min) 40 min    Equipment Utilized During Treatment Gait belt    Activity Tolerance Patient tolerated treatment well;No increased pain    Behavior During Therapy WFL for tasks assessed/performed             Past Medical History:  Diagnosis Date   Arteriovenous malformation of brain    a. s/p remote coiling.   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   Congenital CHF (congestive heart failure) (HCC)    a. 03/2023 Echo: EF 60-65%, no rwma, nl RV fxn, RVSP 29.69mmHg. Mild MR. Mild Ao sclerosis w/o stenosis. Ao root 38mm.   COVID 2022   Hemorrhagic stroke (HCC) 2023   a. L-sided midbrain, thalamic ICH in setting of PCA/ACA aneurysm s/p embolization.   History of broken collarbone 2020   Hydrocephalus (HCC)    a. s/p VP shunt in childhood.   Precordial chest pain    a. 03/2023 MV: No ischemia or infarct.  EF greater than 65%.  No significant coronary calcification.   Sepsis secondary to UTI (HCC) 2021   Past Surgical History:  Procedure Laterality Date   VENTRICULOPERITONEAL SHUNT     Patient Active Problem List   Diagnosis Date Noted   History of CVA with residual deficit 11/25/2023   CHF (congestive heart failure) (HCC) 03/25/2023   Left-sided nontraumatic intracerebral hemorrhage (HCC) 08/01/2022   AVM (arteriovenous malformation) brain 07/24/2022   Asthma 07/24/2022   Major depressive disorder, recurrent episode, moderate (HCC) 02/07/2022   Sepsis secondary to UTI (HCC) 05/01/2020   VP  (ventriculoperitoneal) shunt status 05/01/2020   Right ovarian cyst 05/01/2020   Elevated LFTs 05/01/2020    ONSET DATE: September  2023  REFERRING DIAG:  Diagnosis  I69.30 (ICD-10-CM) - History of CVA with residual deficit    THERAPY DIAG:  Muscle weakness (generalized)  Other lack of coordination  History of CVA with residual deficit  Difficulty in walking, not elsewhere classified  Unsteadiness on feet  Balance disorder  Other abnormalities of gait and mobility  Rationale for Evaluation and Treatment: Rehabilitation  SUBJECTIVE:  SUBJECTIVE STATEMENT:  Pt reports doing well today. Pt denies any recent falls/stumbles since prior session. Pt denies any updates to medications or medical appointment since prior session. Pt reports good compliance with HEP when time permits. Pt is excited to come to stroke support group next week.     Pt accompanied by: self  PERTINENT HISTORY:  From recent MD visit:  Anna Tapia presents to follow up on chronic medical conditions. Having some knee pain today, had been doing well with PT but her insurance is no longer paying for this until the new year.    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  Also on Lyrica 50 mg BID  -Difficulty using using right hand and right side of face - completed home PT/OT but interested in continuing with therapy outside the home.  Hx of CHF as a newborn: -Had been following  with [facility]iology at [facility], last seen in 2018 -Last echo 9/23 EF 58% -Denies chest pain, palpitations, shortness of breath or lower extremity swelling  PAIN:  Are you having pain? Yes: NPRS scale: 4/10 Pain location: R shoulder  Pain description: dull pain  Aggravating factors: moving arm  Relieving factors: n/a   PRECAUTIONS: Fall  RED FLAGS: None   WEIGHT BEARING RESTRICTIONS: No  FALLS: Has patient fallen in last 6 months? No  LIVING ENVIRONMENT: Lives with: lives with their family Lives in: House/apartment Stairs: Yes: External: 2 steps; on right going up and on left going up Has following equipment at home: Single point cane  PLOF: Independent, Independent with basic ADLs, and prior to CVA   PATIENT GOALS: improve walking. Get around better   OBJECTIVE:  Note: Objective measures were completed at Evaluation unless otherwise noted.  DIAGNOSTIC FINDINGS:   CT 2023 IMPRESSION: 1. 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 Again, prominent arterial contribution to the AVM is seen from the left ACA and posterior circulation, with primary venous drainage into the deep venous system. Associated 7 mm aneurysm along the right anterolateral aspect of the AVM as above. 2. Diffuse tortuosity and ectasia elsewhere about the major arterial vasculature of the head and neck. No large vessel occlusion or hemodynamically significant stenosis. No other acute vascular abnormality.    COGNITION: Overall cognitive status: Within functional limits for tasks assessed   SENSATION: Light touch: Impaired  tight feeling on the R foot due to swelling   COORDINATION: Spastic hemiplegia on the R side.   EDEMA:  Circumferential:  distal to knee in sitting R: 41.6CM L 39cm  MUSCLE TONE: RLE: Mild noted increased tone with gait and mobility.   DTRs:  Patella 1 = Trace   POSTURE: rounded shoulders, forward head, and left pelvic  obliquity  LOWER EXTREMITY ROM:     Active  Right Eval Left Eval  Hip flexion [facility]  Hip extension [facility]  Hip abduction    Hip adduction    Hip internal rotation    Hip external rotation    Knee flexion 110 WFL  Knee extension [facility]  Ankle dorsiflexion 8 15  Ankle plantarflexion WF  WFL  Ankle inversion    Ankle eversion     (Blank rows = not tested)  LOWER EXTREMITY MMT:    03/25/24 MMT Right Eval Left Eval  Hip flexion 4+ 4+  Hip extension    Hip abduction 5 5  Hip adduction 5 5  Hip internal rotation    Hip external rotation    Knee flexion 4+ 5  Knee extension 5 5  Ankle dorsiflexion 4 5  Ankle plantarflexion    Ankle inversion    Ankle eversion    (Blank rows = not tested)  BED MOBILITY:  Sit to supine SBA Supine to sit SBA Rolling to Right SBA Rolling to Left SBA  TRANSFERS: Assistive device utilized: None  Sit to stand: SBA Stand to sit: SBA Chair to chair: SBA Floor: need to assess  CURB:  Level of Assistance: Modified independence and SBA Assistive device utilized: rail  Curb Comments: step to descent   STAIRS: Level of Assistance: Modified independence Stair Negotiation Technique: Step to Pattern with descent and Single Rail on Left Number of Stairs: 4  Height of Stairs: 6  Comments: mild posterior bias on descent. And step to pattern   GAIT: Gait pattern: step through pattern, decreased stance time- Right, Right hip hike, trunk rotated posterior- Left, wide BOS, and poor foot clearance- Right Distance walked: 60 Assistive device utilized: None Level of assistance: Modified independence and SBA Comments: see description  FUNCTIONAL TESTS:  See Goal assessment.   PATIENT SURVEYS:  ABC scale 48.75  See goals assessment                                                                                                                              TREATMENT DATE: 07/01/24  Nustep x 6 min level 1-4 with cues for full ROM on the  RUE and to maintain consistent SPM through varied resistance.   BWSTT 5 min x 2, 365 for first 5 min and 708 total after second bout. PT provided obstacle navigation on second bout to step over small golf club intermittently.   Sit<>stand 2 x 10 in <30 sec each  Standing in parallel bars, semitandem 2 x 30 sec  SLS with RUE suppor ton rail 3 x 15 sec bil    Multiple rest breaks required throughout session due to mild SOB and BLE fatigue     PATIENT EDUCATION: Education details: POC.  Person educated: Patient Education method: Medical illustrator Education comprehension: verbalized understanding  HOME EXERCISE PROGRAM: Access Code: [email or web address] URL: [email or web address] Date: 02/19/2024 Prepared by: Massie Dollar  Exercises - Seated March with Ankle Weights at Foot  - 1 x daily - 7 x weekly - 3 sets - 10 reps - Side Stepping with Resistance at Feet  - 1 x daily - 7 x weekly - 3 sets - 10 reps - Tandem Stance  - 1 x daily - 5 x weekly - 3 sets - 4 reps -  20 hold - Staggered Sit-to-Stand  - 1 x daily - 5 x weekly - 2 sets - 5 reps - Sit to Stand with Arms Crossed  - 1 x daily - 5 x weekly - 3 sets - 10 reps - Seated Knee Extension with Resistance  - 1 x daily - 7 x weekly - 3 sets - 10 reps - Seated Hip Abduction with Resistance  - 1 x daily - 7 x weekly - 3 sets - 10 reps - Standing Single Leg Stance with Counter Support  - 1 x daily - 7 x weekly - 3 sets - 10 reps  GOALS: Goals reviewed with patient? Yes  SHORT TERM GOALS: Target date: 07/01/2024  Patient will be independent in home exercise program to improve strength/mobility for better functional independence with ADLs. Baseline: to be given on visit 2  5/:22: HEP provided and reviewed since 4/17 7/24: reports that she has not complete for ~ 1 MONTH - reprinted on this day  .    Goal status: IN PROGRESS  LONG TERM GOALS: Target date: 08/19/2024  Patient will increase ABC score to equal to or  greater than   10%  to demonstrate statistically significant improvement in mobility and quality of life.  Baseline: 48.75 5/22: 62.5% 7/24: 56.25%  Goal status: IN PROGRESS  2.  Patient (> 42 years old) will complete five times sit to stand test in < 11 seconds indicating an increased LE strength and improved balance. Baseline: 12.41sec 5/22: 11.91sec without UE support average of 2 trials. 7/24: 14.34 sec  Goal status: IN PROGRESS  3.  Patient will increase 6 min walk test to >1245ft to demonstrate decreased fall risk during functional activities with community mobility/  Baseline: 935ft without AD. No rest break needed.  5/22: 980ft without AD no rest break required.  7/24: 1068 no AD> no rest breaks  Goal status: IN PROGRESS  4.  Patient will increase 10 meter walk test to >1.17m/s as to improve gait speed for better community ambulation and to reduce fall risk. Baseline: 0.74m/s 5/22:Average Normal speed: 0.87 m/s; Average Fast speed: 1.03 m/s 7/24: 0.40m/s no AD  Goal status: IN PROGRESS  5.  Patient will reduce timed up and go to <10 seconds to reduce fall risk and demonstrate improved transfer/gait ability. Baseline: 10.94 5/22: 9.08 sec without UE support  7/24: 12.86 sec without AD   Goal status: IN PROGRESS  6.  Patient will increase dynamic gait index score to >24 as to demonstrate reduced fall risk and improved dynamic gait balance for better safety with community/home ambulation.   Baseline: 20; 06/03/2024= 20 Goal status: IN PROGRESS   ASSESSMENT:  CLINICAL IMPRESSION:   Patient continues to progress with activity tolerance training with variable gait training to simulate community access including unlevel surface, stairs and obstacle navigation. Was able to perform step through gait pattern on stair on this day with descent as well as safe management of reverse gait on unlevel surface, but still demonstrating difficulty with step symmetry in reverse.  Pt will  continue to benefit from skilled therapy to address remaining deficits in order to improve overall QoL and return to PLOF.       OBJECTIVE IMPAIRMENTS: Abnormal gait, cardiopulmonary status limiting activity, decreased activity tolerance, decreased balance, decreased coordination, decreased endurance, decreased knowledge of use of DME, decreased mobility, difficulty walking, decreased ROM, decreased strength, hypomobility, increased edema, increased fascial restrictions, increased muscle spasms, impaired flexibility, impaired sensation, impaired tone, impaired UE functional  use, improper body mechanics, postural dysfunction, and pain.   ACTIVITY LIMITATIONS: carrying, lifting, bending, squatting, stairs, transfers, bed mobility, dressing, and locomotion level  PARTICIPATION LIMITATIONS: cleaning, laundry, interpersonal relationship, driving, shopping, community activity, occupation, and yard work  PERSONAL FACTORS: 1-2 comorbidities: hs of CHF and CVA are also affecting patient's functional outcome.   REHAB POTENTIAL: Good  CLINICAL DECISION MAKING: Evolving/moderate complexity  EVALUATION COMPLEXITY: Moderate  PLAN:  PT FREQUENCY: 1-2x/week  PT DURATION: 12 weeks  PLANNED INTERVENTIONS: 97110-Therapeutic exercises, 97530- Therapeutic activity, 97112- Neuromuscular re-education, 97535- Self Care, [id number]- Manual therapy, [phone]- Gait training, [phone]- Orthotic Fit/training, [phone]- Splinting, [phone]- Electrical stimulation (unattended), [phone]- Electrical stimulation (manual), Patient/Family education, Balance training, Stair training, Taping, Dry Needling, Joint mobilization, Joint manipulation, Spinal manipulation, Spinal mobilization, Vestibular training, Visual/preceptual remediation/compensation, DME instructions, Cryotherapy, and Moist heat  PLAN FOR NEXT SESSION:   Continue Dynamic balance training.  R ankle and gluteal strengthening  HEP review    Massie FORBES Dollar PT ,DPT Physical  Therapist- [facility]  [facility]   07/01/24, 3:37 PM

## 2024-07-02 ENCOUNTER — Other Ambulatory Visit: Payer: Self-pay

## 2024-07-02 VITALS — BP 132/82

## 2024-07-02 DIAGNOSIS — F32A Depression, unspecified: Secondary | ICD-10-CM

## 2024-07-02 NOTE — Patient Instructions (Signed)
 Visit Information  Anna Tapia was given information about Medicaid Managed Care team care coordination services as a part of their [facility] Medicaid benefit.   If you would like to schedule transportation through your [facility] plan, please call the following number at least 2 days in advance of your appointment: [phone].   You can also use the MTM portal or MTM mobile app to manage your rides. Reimbursement for transportation is available through [facility]! For the portal, please go to [email or web address]  Call the [facility] Crisis Line at [phone], at any time, 24 hours a day, 7 days a week. If you are in danger or need immediate medical attention call 911.   Anna Tapia - following are the goals we discussed in your visit today:   Goals Addressed             This Visit's Progress    VBCI RN Care Plan - CHF       Problems:  Chronic Disease Management support and education needs related to CHF  Goal: Over the next 90 days the Patient will attend all scheduled medical appointments: Patient will attend all appointments as evidenced by chart review        continue to work with RN Care Manager and/or Social Worker to address care management and care coordination needs related to CHF as evidenced by adherence to care management team scheduled appointments     take all medications exactly as prescribed and will call provider for medication related questions as evidenced by Patient reporting compliance with all medications    verbalize basic understanding of CHF disease process and self health management plan as evidenced by BP and weight checks daily, record and bring to PCP appointments, continue: exercise, low salt cardiac diet, read and follow CHF Action Plan regarding when to call provider and red flags for ED  Interventions:   Heart Failure Interventions: Basic overview and discussion of pathophysiology of Heart Failure reviewed Provided education on low sodium  diet Reviewed Heart Failure Action Plan in depth and provided written copy Advised patient to weigh each morning after emptying bladder Discussed importance of daily weight and advised patient to weigh and record daily Reviewed role of diuretics in prevention of fluid overload and management of heart failure; Discussed the importance of keeping all appointments with provider Provided patient with education about the role of exercise in the management of heart failure Screening for signs and symptoms of depression related to chronic disease state  Assessed social determinant of health barriers   Patient Self-Care Activities:  Attend all scheduled provider appointments Call pharmacy for medication refills 3-7 days in advance of running out of medications Call provider office for new concerns or questions  Take medications as prescribed   Work with the social worker to address care coordination needs and will continue to work with the clinical team to address health care and disease management related needs  Plan:  Telephone follow up appointment with care management team member scheduled for:  2 Ronette Hank          VBCI RN Care Plan - HTN       Problems:  Chronic Disease Management support and education needs related to HTN  Goal: Over the next 90 days the Patient will attend all scheduled medical appointments: Patient will attend all appointments as evidenced by chart review        continue to work with RN Care Manager and/or Social Worker to address care management and care coordination needs  related to HTN as evidenced by adherence to care management team scheduled appointments     take all medications exactly as prescribed and will call provider for medication related questions as evidenced by patient reporting compliance with all medications    verbalize basic understanding of HTN disease process and self health management plan as evidenced by checking and recording BP daily, reporting  concerns to provider and recognizing red flags for ED, continue low salt cardiac diet, exercise + Attend Stroke Support Group  +Attend Appointment with LCSW for depression management 07/21/24 at 11:00 am + Reapply for disability - RNCM notified BSW assist requested   Interventions:   Hypertension Interventions: Last practice recorded BP readings:  BP Readings from Last 3 Encounters:  07/02/24 132/82  06/22/24 124/76  05/25/24 (!) 140/86   Most recent eGFR/CrCl:  Lab Results  Component Value Date   EGFR 121 04/02/2024    No components found for: CRCL  Provided education to patient re: stroke prevention, s/s of heart attack and stroke Counseled on the importance of exercise goals with target of 150 minutes per week Advised patient, providing education and rationale, to monitor blood pressure daily and record, calling PCP for findings outside established parameters Provided education on prescribed diet low salt/ cardiac  Discussed complications of poorly controlled blood pressure such as heart disease, stroke, circulatory complications, vision complications, kidney impairment, sexual dysfunction Screening for signs and symptoms of depression related to chronic disease state  Assessed social determinant of health barriers  Patient Self-Care Activities:  Attend all scheduled provider appointments Call pharmacy for medication refills 3-7 days in advance of running out of medications Take medications as prescribed   Work with the social worker to address care coordination needs and will continue to work with the clinical team to address health care and disease management related needs Attend stroke support group Attend LCSW appointment for depression management  Plan:  Telephone follow up appointment with care management team member scheduled for:  07/16/2024 at 10:30 am             Please see education materials related to CHF, HTN provided by MyChart link.  Patient  verbalizes understanding of instructions and care plan provided today and agrees to view in MyChart. Active MyChart status and patient understanding of how to access instructions and care plan via MyChart confirmed with patient.     Telephone follow up appointment with Managed Medicaid care management team member scheduled for:  [name], MSN, RN [facility]  [facility], [facility] RN Care Manager Direct Dial: [phone] Fax: [phone]  Heart Failure Action Plan A heart failure action plan helps you know what to do when you have symptoms of heart failure. Your action plan is a color-coded plan that lists the symptoms to watch for and indicates what actions to take. If you have symptoms in the green zone, you're doing well. If you have symptoms in the yellow zone, you're having problems. If you have symptoms in the red zone, you need medical care right away. Follow the plan that was created by you and your health care provider. Review your plan each time you visit your provider. Green zone These signs mean you're doing well and can continue what you're doing: You don't have new or worsening shortness of breath. You have very little swelling or no new swelling. Your weight is stable (no gain or loss). You have a normal activity level. You don't have chest pain or any other new symptoms.  Yellow zone These signs and symptoms mean your condition may be getting worse and you should make some changes: You have trouble breathing when you're active. You have swelling in your feet or legs or have discomfort in your belly. You gain 2-3 lb (0.9-1.4 kg) in 24 hours, or 5 lb (2.3 kg) in a week. This amount may be more or less depending on your condition. You get tired easily. You have trouble sleeping. You have a dry cough. If you have any of these symptoms: Contact your provider within the next day. Your provider may adjust your medicines. Red zone These  signs and symptoms mean you should get medical help right away: You have trouble breathing when resting or cannot lie flat and you need to raise your head to help you breathe. You have a dry cough that's getting worse. You have swelling or pain in your feet or legs or discomfort in your belly that's getting worse. You suddenly gain more than 2-3 lb (0.9-1.4 kg) in 24 hours, or more than 5 lb (2.3 kg) in a week. This amount may be more or less depending on your condition. You have trouble staying awake or you feel confused. You don't have an appetite. You have worsening sadness or depression. These symptoms may be an emergency. Call 911 right away. Do not wait to see if the symptoms will go away. Do not drive yourself to the hospital. Follow these instructions at home: Take medicines only as told. Eat a heart-healthy diet. Work with a dietitian to create an eating plan that's best for you. Weigh yourself each day. Your target weight is __________ lb (__________ kg). Call your provider if you gain more than __________ lb (__________ kg) in 24 hours, or more than __________ lb (__________ kg) in a week. Health care provider name: _____________________________________________________ Health care provider phone number: _____________________________________________________ Where to find more information American Heart Association: heart.org This information is not intended to replace advice given to you by your health care provider. Make sure you discuss any questions you have with your health care provider. Document Revised: 06/05/2023 Document Reviewed: 06/05/2023 Elsevier Patient Education  2024 [employer].  Advance Directive  Advance directives are legal papers that state your wishes about health care decisions. They let your wishes be known to family, friends, and health care providers if you become unable to speak for yourself.  You should write these papers out over time rather than  all at once. They can be changed and updated at any time. The types of advance directives include: Medical power of attorney (POA). Living wills. Do not resuscitate (DNR) or do not attempt resuscitation (DNAR) orders. What are a health care proxy and medical POA? A health care proxy is also called a health care agent. It's a person you choose to make medical decisions for you when you can't make them for yourself. In most cases, a proxy is a trusted friend or family member. A medical POA is legal paperwork that names your proxy. It may need to be: Signed. Notarized. Dated. Copied. Witnessed. Added to your medical record. You may also want to choose someone to handle your money if you can't do so. This is called a durable POA for finances. It's separate from a medical POA. You may choose your health care proxy or someone else to act as your agent in money matters. If you don't have a proxy, or if the proxy may not be acting in your best interest, a court may  choose a guardian to act on your behalf. What is a living will? A living will is legal paperwork that states your wishes about medical care. Providers should keep a copy of it in your medical record. You may want to give a copy to family members or friends. You can also keep a card in your wallet to let loved ones know you have a living will and where they can find it. A living will may be used if: You're very sick with something that will end your life. You become disabled. You can't make decisions or speak for yourself. Your living will should include whether: To use or not use life support equipment. This may include machines to filter your blood or to help you breathe. You want a DNR or DNAR order. This tells providers not to use CPR if your heart or breathing stops. To use or not use tube feeding. You want to be given foods and fluids. You want a type of comfort care called palliative care. This may be given when the goal for  treatment becomes comfort rather than a cure. You want to donate your organs and tissues. A living will doesn't say what to do with your money and property if you pass away. What is a DNR or DNAR? A DNR or DNAR order is a request not to have CPR. If you don't have one of these orders, a provider will try to help you if your heart stops or you stop breathing.  If you plan to have surgery, talk with your provider about your DNR or DNAR order. What happens if I don't have an advance directive? Each state has its own laws about advance directives. Some states assign family decision makers to act on your behalf if you don't have an advance directive.  Check with your provider, attorney, or state representative about the laws in your state. Where to find more information Each state has its own laws about advance directives. You can look up these laws at: https://rodriguez-phillips.com/ This information is not intended to replace advice given to you by your health care provider. Make sure you discuss any questions you have with your health care provider. Document Revised: 03/10/2023 Document Reviewed: 03/10/2023 Elsevier Patient Education  2024 Elsevier Inc.Managing Your Hypertension Hypertension, also called high blood pressure, is when the force of the blood pressing against the walls of the arteries is too strong. Arteries are blood vessels that carry blood from your heart throughout your body. Hypertension forces the heart to work harder to pump blood and may cause the arteries to become narrow or stiff. Understanding blood pressure readings A blood pressure reading includes a higher number over a lower number: The first, or top, number is called the systolic pressure. It is a measure of the pressure in your arteries as your heart beats. The second, or bottom number, is called the diastolic pressure. It is a measure of the pressure in your arteries as the heart relaxes. For most people, a normal blood pressure is below  120/80. Your personal target blood pressure may vary depending on your medical conditions, your age, and other factors. Blood pressure is classified into four stages. Based on your blood pressure reading, your health care provider may use the following stages to determine what type of treatment you need, if any. Systolic pressure and diastolic pressure are measured in a unit called millimeters of mercury (mmHg). Normal Systolic pressure: below 120. Diastolic pressure: below 80. Elevated Systolic pressure: 120-129. Diastolic pressure:  below 80. Hypertension stage 1 Systolic pressure: 130-139. Diastolic pressure: 80-89. Hypertension stage 2 Systolic pressure: 140 or above. Diastolic pressure: 90 or above. How can this condition affect me? Managing your hypertension is very important. Over time, hypertension can damage the arteries and decrease blood flow to parts of the body, including the brain, heart, and kidneys. Having untreated or uncontrolled hypertension can lead to: A heart attack. A stroke. A weakened blood vessel (aneurysm). Heart failure. Kidney damage. Eye damage. Memory and concentration problems. Vascular dementia. What actions can I take to manage this condition? Hypertension can be managed by making lifestyle changes and possibly by taking medicines. Your health care provider will help you make a plan to bring your blood pressure within a normal range. You may be referred for counseling on a healthy diet and physical activity. Nutrition  Eat a diet that is high in fiber and potassium, and low in salt (sodium), added sugar, and fat. An example eating plan is called the DASH diet. DASH stands for Dietary Approaches to Stop Hypertension. To eat this way: Eat plenty of fresh fruits and vegetables. Try to fill one-half of your plate at each meal with fruits and vegetables. Eat whole grains, such as whole-wheat pasta, brown rice, or whole-grain bread. Fill about one-fourth of  your plate with whole grains. Eat low-fat dairy products. Avoid fatty cuts of meat, processed or cured meats, and poultry with skin. Fill about one-fourth of your plate with lean proteins such as fish, chicken without skin, beans, eggs, and tofu. Avoid pre-made and processed foods. These tend to be higher in sodium, added sugar, and fat. Reduce your daily sodium intake. Many people with hypertension should eat less than 1,500 mg of sodium a day. Lifestyle  Work with your health care provider to maintain a healthy body weight or to lose weight. Ask what an ideal weight is for you. Get at least 30 minutes of exercise that causes your heart to beat faster (aerobic exercise) most days of the week. Activities may include walking, swimming, or biking. Include exercise to strengthen your muscles (resistance exercise), such as weight lifting, as part of your weekly exercise routine. Try to do these types of exercises for 30 minutes at least 3 days a week. Do not use any products that contain nicotine or tobacco. These products include cigarettes, chewing tobacco, and vaping devices, such as e-cigarettes. If you need help quitting, ask your health care provider. Control any long-term (chronic) conditions you have, such as high cholesterol or diabetes. Identify your sources of stress and find ways to manage stress. This may include meditation, deep breathing, or making time for fun activities. Alcohol use Do not drink alcohol if: Your health care provider tells you not to drink. You are pregnant, may be pregnant, or are planning to become pregnant. If you drink alcohol: Limit how much you have to: 0-1 drink a day for women. 0-2 drinks a day for men. Know how much alcohol is in your drink. In the [place] one drink equals one 12 oz bottle of beer (355 mL), one 5 oz glass of wine (148 mL), or one 1 oz glass of hard liquor (44 mL). Medicines Your health care provider may prescribe medicine if lifestyle  changes are not enough to get your blood pressure under control and if: Your systolic blood pressure is 130 or higher. Your diastolic blood pressure is 80 or higher. Take medicines only as told by your health care provider. Follow the directions carefully. Blood  pressure medicines must be taken as told by your health care provider. The medicine does not work as well when you skip doses. Skipping doses also puts you at risk for problems. Monitoring Before you monitor your blood pressure: Do not smoke, drink caffeinated beverages, or exercise within 30 minutes before taking a measurement. Use the bathroom and empty your bladder (urinate). Sit quietly for at least 5 minutes before taking measurements. Monitor your blood pressure at home as told by your health care provider. To do this: Sit with your back straight and supported. Place your feet flat on the floor. Do not cross your legs. Support your arm on a flat surface, such as a table. Make sure your upper arm is at heart level. Each time you measure, take two or three readings one minute apart and record the results. You may also need to have your blood pressure checked regularly by your health care provider. General information Talk with your health care provider about your diet, exercise habits, and other lifestyle factors that may be contributing to hypertension. Review all the medicines you take with your health care provider because there may be side effects or interactions. Keep all follow-up visits. Your health care provider can help you create and adjust your plan for managing your high blood pressure. Where to find more information National Heart, Lung, and Blood Institute: PopSteam.is American Heart Association: www.heart.org Contact a health care provider if: You think you are having a reaction to medicines you have taken. You have repeated (recurrent) headaches. You feel dizzy. You have swelling in your ankles. You have  trouble with your vision. Get help right away if: You develop a severe headache or confusion. You have unusual weakness or numbness, or you feel faint. You have severe pain in your chest or abdomen. You vomit repeatedly. You have trouble breathing. These symptoms may be an emergency. Get help right away. Call 911. Do not wait to see if the symptoms will go away. Do not drive yourself to the hospital. Summary Hypertension is when the force of blood pumping through your arteries is too strong. If this condition is not controlled, it may put you at risk for serious complications. Your personal target blood pressure may vary depending on your medical conditions, your age, and other factors. For most people, a normal blood pressure is less than 120/80. Hypertension is managed by lifestyle changes, medicines, or both. Lifestyle changes to help manage hypertension include losing weight, eating a healthy, low-sodium diet, exercising more, stopping smoking, and limiting alcohol. This information is not intended to replace advice given to you by your health care provider. Make sure you discuss any questions you have with your health care provider. Document Revised: 07/05/2021 Document Reviewed: 07/05/2021 Elsevier Patient Education  2024 [employer].

## 2024-07-02 NOTE — Patient Outreach (Signed)
 Complex Care Management   Visit Note  07/02/2024  Name:  Anna Tapia MRN: [id number] DOB: 1991-03-12  Situation: Referral received for Complex Care Management related to Heart Failure and HTN I obtained verbal consent from Patient.  Visit completed with Patient  on the phone  Background:   Past Medical History:  Diagnosis Date   Arteriovenous malformation of brain    a. s/p remote coiling.   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   Congenital CHF (congestive heart failure) (HCC)    a. 03/2023 Echo: EF 60-65%, no rwma, nl RV fxn, RVSP 29.68mmHg. Mild MR. Mild Ao sclerosis w/o stenosis. Ao root 38mm.   COVID 2022   Hemorrhagic stroke (HCC) 2023   a. L-sided midbrain, thalamic ICH in setting of PCA/ACA aneurysm s/p embolization.   History of broken collarbone 2020   Hydrocephalus (HCC)    a. s/p VP shunt in childhood.   Precordial chest pain    a. 03/2023 MV: No ischemia or infarct.  EF greater than 65%.  No significant coronary calcification.   Sepsis secondary to UTI [facility]) 2021    Assessment: Patient Reported Symptoms:  Cognitive Cognitive Status: Struggling with memory recall, Alert and oriented to person, place, and time, Insightful and able to interpret abstract concepts, Normal speech and language skills (Short term memory issues - forgets, writes things down) Cognitive/Intellectual Conditions Management [RPT]: Brain Injury Brain Injury: stroke 2 years ago - discussed applying for diability again, will message BSW to assist   Health Maintenance Behaviors: Annual physical exam, Exercise, Healthy diet Healing Pattern: Slow Health Facilitated by: Healthy diet  Neurological   Neurological Comment: Weakness on right arm and leg, decreased sensation in hand a foot, numb right side of mouth, dizziness with quick head movements, right arm and leg injections for spacticity and baclofen   HEENT HEENT Symptoms Reported: No symptoms  reported HEENT Management Strategies: Routine screening HEENT Comment: glasses    Cardiovascular Cardiovascular Symptoms Reported: Chest pain or discomfort, Palpitations, Swelling in legs or feet Cardiovascular Management Strategies: Medication therapy, Routine screening Cardiovascular Comment: just right foot swelling post stroke, compression hose,  precordial CP no other sx associated, VP Shunt - aware of symptoms to call provider and red flags for ED, no recent issues with CHF, discussed CHF Action plan and sent - advised to start checking weight regularly  Respiratory Respiratory Symptoms Reported: Dry cough Other Respiratory Symptoms: has inhalers as needed. dry cough with lisinopril  managed with cough drops Additional Respiratory Details: checking BP daily Respiratory Management Strategies: Routine screening, Medication therapy  Endocrine Endocrine Symptoms Reported: No symptoms reported Is patient diabetic?: No    Gastrointestinal Gastrointestinal Symptoms Reported: Nausea, Constipation Additional Gastrointestinal Details: manages constipation through diet, nausea occasionally - pepto bismol prn Gastrointestinal Management Strategies: Medication therapy, Diet modification    Genitourinary Genitourinary Symptoms Reported: No symptoms reported Additional Genitourinary Details: PAP/Gyn regularly, no issues with periods    Integumentary Integumentary Symptoms Reported: No symptoms reported    Musculoskeletal Musculoskelatal Symptoms Reviewed: Weakness, Joint pain, Muscle pain Additional Musculoskeletal Details: Right side weakness and spacticity - injections, if walks/exercises too much - increased pain Musculoskeletal Management Strategies: Adequate rest, Routine screening, Medication therapy Falls in the past year?: No Number of falls in past year: 1 or less Was there an injury with Fall?: No Fall Risk Category Calculator: 0 Patient Fall Risk Level: Low Fall Risk Patient at Risk  for Falls Due to: History of  fall(s), Impaired balance/gait, Impaired mobility Fall risk Follow up: Falls evaluation completed, Falls prevention discussed (VP Shunt - ED for Head Bumps)  Psychosocial Psychosocial Symptoms Reported: Depression - if selected complete PHQ 2-9 Additional Psychological Details: plans to attend stroke support group, counseling before stroke for depression, none recently, duloxetine  but still depressed, discussed LCSW services and RNCM will schedule Behavioral Management Strategies: Support group, Support system Major Change/Loss/Stressor/Fears (CP): Medical condition, self Techniques to Cope with Loss/Stress/Change: Medication, Withdraw, Diversional activities Quality of Family Relationships: supportive, helpful Do you feel physically threatened by others?: No    07/02/2024    PHQ2-9 Depression Screening   Little interest or pleasure in doing things Several days  Feeling down, depressed, or hopeless Not at all  PHQ-2 - Total Score 1  Trouble falling or staying asleep, or sleeping too much    Feeling tired or having little energy    Poor appetite or overeating     Feeling bad about yourself - or that you are a failure or have let yourself or your family down    Trouble concentrating on things, such as reading the newspaper or watching television    Moving or speaking so slowly that other people could have noticed.  Or the opposite - being so fidgety or restless that you have been moving around a lot more than usual    Thoughts that you would be better off dead, or hurting yourself in some way    PHQ2-9 Total Score    If you checked off any problems, how difficult have these problems made it for you to do your work, take care of things at home, or get along with other people    Depression Interventions/Treatment      Vitals:   07/02/24 1104  BP: 132/82    Medications Reviewed Today     Reviewed by Devra Lands, RN (Registered Nurse) on 07/02/24 at 1044   Med List Status: <None>   Medication Order Taking? Sig Documenting Provider Last Dose Status Informant  albuterol  (VENTOLIN  HFA) 108 (90 Base) MCG/ACT inhaler [id number] Yes Inhale 2 puffs into the lungs every 6 (six) hours as needed for wheezing or shortness of breath. Bernardo Fend, DO  Active   baclofen  (LIORESAL ) 10 MG tablet [id number] Yes TAKE ONE-HALF TABLET BY MOUTH IN THE MORNING, ONE TABLET AT LUNCH, AND 2 TABLETS AT ADA Bernardo Fend, DO  Active   Blood Pressure Monitoring (ADULT BLOOD PRESSURE CUFF LG) KIT [id number] Yes 1 each by Does not apply route daily. Bernardo Fend, DO  Active   DULoxetine  (CYMBALTA ) 20 MG capsule [id number] Yes Take 2 capsules by mouth once daily Bernardo Fend, DO  Active   famotidine  (PEPCID ) 20 MG tablet [id number]  Take 1 tablet (20 mg total) by mouth 2 (two) times daily.  Patient not taking: Reported on 07/02/2024   Viviann Pastor, MD  Active Pharmacy Records, Multiple Informants           Med Note GROVER BURNARD GORMAN Stevan Jul 24, 2022  7:57 PM) 07/03/2022 20 MG TABS (disp 60, 30d supply)   fluticasone -salmeterol (ADVAIR) 100-50 MCG/ACT AEPB [id number] Yes Inhale 1 puff into the lungs 2 (two) times daily. Bernardo Fend, DO  Active   lisinopril  (ZESTRIL ) 5 MG tablet [id number] Yes Take 1 tablet (5 mg total) by mouth daily. Bernardo Fend, DO  Active   pregabalin (LYRICA) 50 MG capsule [id number] Yes Take 50 mg by mouth 2 (two) times daily. [provider]  Active  Recommendation:   PCP Follow-up Continue Current Plan of Care BSW 07/19/24 - discuss applying for disability LCSW 07/21/2024 at 11:00 am, please call me if this does not work for you  Follow Up Plan:   Telephone follow-up 2 Feliza Diven  [name], MSN, RN [facility]  [facility] RN Care Manager Direct Dial: [phone] Fax: [phone]

## 2024-07-08 ENCOUNTER — Ambulatory Visit: Attending: Internal Medicine | Admitting: Physical Therapy

## 2024-07-08 ENCOUNTER — Ambulatory Visit: Admitting: Physical Therapy

## 2024-07-08 ENCOUNTER — Ambulatory Visit

## 2024-07-08 DIAGNOSIS — R2689 Other abnormalities of gait and mobility: Secondary | ICD-10-CM | POA: Insufficient documentation

## 2024-07-08 DIAGNOSIS — I6930 Unspecified sequelae of cerebral infarction: Secondary | ICD-10-CM | POA: Diagnosis not present

## 2024-07-08 DIAGNOSIS — Z7689 Persons encountering health services in other specified circumstances: Secondary | ICD-10-CM | POA: Diagnosis not present

## 2024-07-08 DIAGNOSIS — M6281 Muscle weakness (generalized): Secondary | ICD-10-CM | POA: Diagnosis not present

## 2024-07-08 DIAGNOSIS — R262 Difficulty in walking, not elsewhere classified: Secondary | ICD-10-CM | POA: Insufficient documentation

## 2024-07-08 DIAGNOSIS — R2681 Unsteadiness on feet: Secondary | ICD-10-CM | POA: Diagnosis not present

## 2024-07-08 NOTE — Therapy (Signed)
 OUTPATIENT PHYSICAL THERAPY NEURO TREATMENT  Patient Name: Anna Tapia MRN: [id number] DOB:[birth year], 33 y.o., female Today's Date: 07/08/2024   PCP: Anna Fend, DO  REFERRING PROVIDER: Bernardo Fend, DO   END OF SESSION:  PT End of Session - 07/08/24 1549     Visit Number 15    Number of Visits 24    Date for PT Re-Evaluation 08/19/24    PT Start Time 0200    PT Stop Time 0243    PT Time Calculation (min) 43 min    Equipment Utilized During Treatment Gait belt    Activity Tolerance Patient tolerated treatment well;No increased pain    Behavior During Therapy WFL for tasks assessed/performed              Past Medical History:  Diagnosis Date   Arteriovenous malformation of brain    a. s/p remote coiling.   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   Congenital CHF (congestive heart failure) (HCC)    a. 03/2023 Echo: EF 60-65%, no rwma, nl RV fxn, RVSP 29.63mmHg. Mild MR. Mild Ao sclerosis w/o stenosis. Ao root 38mm.   COVID 2022   Hemorrhagic stroke (HCC) 2023   a. L-sided midbrain, thalamic ICH in setting of PCA/ACA aneurysm s/p embolization.   History of broken collarbone 2020   Hydrocephalus (HCC)    a. s/p VP shunt in childhood.   Precordial chest pain    a. 03/2023 MV: No ischemia or infarct.  EF greater than 65%.  No significant coronary calcification.   Sepsis secondary to UTI (HCC) 2021   Past Surgical History:  Procedure Laterality Date   VENTRICULOPERITONEAL SHUNT     Patient Active Problem List   Diagnosis Date Noted   History of CVA with residual deficit 11/25/2023   CHF (congestive heart failure) (HCC) 03/25/2023   Left-sided nontraumatic intracerebral hemorrhage (HCC) 08/01/2022   AVM (arteriovenous malformation) brain 07/24/2022   Asthma 07/24/2022   Major depressive disorder, recurrent episode, moderate (HCC) 02/07/2022   Sepsis secondary to UTI (HCC) 05/01/2020   VP  (ventriculoperitoneal) shunt status 05/01/2020   Right ovarian cyst 05/01/2020   Elevated LFTs 05/01/2020    ONSET DATE: September  2023  REFERRING DIAG:  Diagnosis  I69.30 (ICD-10-CM) - History of CVA with residual deficit    THERAPY DIAG:  Muscle weakness (generalized)  History of CVA with residual deficit  Difficulty in walking, not elsewhere classified  Unsteadiness on feet  Balance disorder  Other abnormalities of gait and mobility  Rationale for Evaluation and Treatment: Rehabilitation  SUBJECTIVE:  SUBJECTIVE STATEMENT:  Pt reports doing well today. Pt denies any recent falls/stumbles since prior session.   No medical updates since last PT session.    Pt accompanied by: self  PERTINENT HISTORY:  From recent MD visit:  Anna Tapia presents to follow up on chronic medical conditions. Having some knee pain today, had been doing well with PT but her insurance is no longer paying for this until the new year.    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  Also on Lyrica 50 mg BID  -Difficulty using using right hand and right side of face - completed home PT/OT but interested in continuing with therapy outside the home.  Hx of CHF as a newborn: -Had been following with Cardiology at [facility], last seen in 2018 -Last echo 9/23 EF 58% -Denies chest pain, palpitations, shortness of breath or lower extremity swelling  PAIN:  Are you having  pain? Yes: NPRS scale: 4/10 Pain location: R shoulder  Pain description: dull pain  Aggravating factors: moving arm  Relieving factors: n/a   PRECAUTIONS: Fall  RED FLAGS: None   WEIGHT BEARING RESTRICTIONS: No  FALLS: Has patient fallen in last 6 months? No  LIVING ENVIRONMENT: Lives with: lives with their family Lives in: House/apartment Stairs: Yes: External: 2 steps; on right going up and on left going up Has following equipment at home: Single point cane  PLOF: Independent, Independent with basic ADLs, and prior to CVA   PATIENT GOALS: improve walking. Get around better   OBJECTIVE:  Note: Objective measures were completed at Evaluation unless otherwise noted.  DIAGNOSTIC FINDINGS:   CT 2023 IMPRESSION: 1. 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 Again, prominent arterial contribution to the AVM is seen from the left ACA and posterior circulation, with primary venous drainage into the deep venous system. Associated 7 mm aneurysm along the right anterolateral aspect of the AVM as above. 2. Diffuse tortuosity and ectasia elsewhere about the major arterial vasculature of the head and neck. No large vessel occlusion or hemodynamically significant stenosis. No other acute vascular abnormality.    COGNITION: Overall cognitive status: Within functional limits for tasks assessed   SENSATION: Light touch: Impaired  tight feeling on the R foot due to swelling   COORDINATION: Spastic hemiplegia on the R side.   EDEMA:  Circumferential:  distal to knee in sitting R: 41.6CM L 39cm  MUSCLE TONE: RLE: Mild noted increased tone with gait and mobility.   DTRs:  Patella 1 = Trace   POSTURE: rounded shoulders, forward head, and left pelvic obliquity  LOWER EXTREMITY ROM:     Active  Right Eval Left Eval  Hip flexion [facility]  Hip extension [facility]  Hip abduction    Hip adduction    Hip internal rotation     Hip external rotation    Knee flexion 110 WFL  Knee extension [facility]  Ankle dorsiflexion 8 15  Ankle plantarflexion [facility]  Ankle inversion    Ankle eversion     (Blank rows = not tested)  LOWER EXTREMITY MMT:  03/25/24 MMT Right Eval Left Eval  Hip flexion 4+ 4+  Hip extension    Hip abduction 5 5  Hip adduction 5 5  Hip internal rotation    Hip external rotation    Knee flexion 4+ 5  Knee extension 5 5  Ankle dorsiflexion 4 5  Ankle plantarflexion    Ankle inversion    Ankle eversion    (Blank rows = not tested)  BED MOBILITY:  Sit to supine SBA Supine to sit SBA Rolling to Right SBA Rolling to Left SBA  TRANSFERS: Assistive device utilized: None  Sit to stand: SBA Stand to sit: SBA Chair to chair: SBA Floor: need to assess  CURB:  Level of Assistance: Modified independence and SBA Assistive device utilized: rail  Curb Comments: step to descent   STAIRS: Level of Assistance: Modified independence Stair Negotiation Technique: Step to Pattern with descent and Single Rail on Left Number of Stairs: 4  Height of Stairs: 6  Comments: mild posterior bias on descent. And step to pattern   GAIT: Gait pattern: step through pattern, decreased stance time- Right, Right hip hike, trunk rotated posterior- Left, wide BOS, and poor foot clearance- Right Distance walked: 60 Assistive device utilized: None Level of assistance: Modified independence and SBA Comments: see description  FUNCTIONAL TESTS:  See Goal assessment.   PATIENT SURVEYS:  ABC scale 48.75  See goals assessment                                                                                                                              TREATMENT DATE: 07/08/24  TA- To improve functional movements patterns for everyday tasks   Incline RLE eccentric calf raises 3 x 12 reps   STS with RLE on floor and LLE on airex 2 z 10 reps   RLE step up step to pattern x 10   RLE step up step through  pattern 2 x 10   RLE lateral step up 2 x 10   TE- To improve strength, endurance, mobility, and function of specific targeted muscle groups or improve joint range of motion or improve muscle flexibility  Leg press staggered stance to improve R LE muscular recruitment x 15 @ 25#, 2 x 12 at 40#   Seated feet on decline board with GTB across RLE 2 x 15 reps of DF in this position   PATIENT EDUCATION: Education details: POC. Benefits of BWSTT to improve step symmetry of increased repetitions/volume  Person educated: Patient Education method: Explanation and Demonstration Education comprehension: verbalized understanding  HOME EXERCISE PROGRAM: Access Code: [email or web address] URL: [email or web address] Date: 02/19/2024 Prepared by: Massie Dollar  Exercises - Seated March with Ankle Weights at Foot  - 1 x daily - 7 x weekly - 3 sets - 10 reps - Side Stepping with Resistance at Feet  - 1 x daily - 7 x weekly - 3 sets - 10 reps - Tandem Stance  - 1 x daily -  5 x weekly - 3 sets - 4 reps - 20 hold - Staggered Sit-to-Stand  - 1 x daily - 5 x weekly - 2 sets - 5 reps - Sit to Stand with Arms Crossed  - 1 x daily - 5 x weekly - 3 sets - 10 reps - Seated Knee Extension with Resistance  - 1 x daily - 7 x weekly - 3 sets - 10 reps - Seated Hip Abduction with Resistance  - 1 x daily - 7 x weekly - 3 sets - 10 reps - Standing Single Leg Stance with Counter Support  - 1 x daily - 7 x weekly - 3 sets - 10 reps  GOALS: Goals reviewed with patient? Yes  SHORT TERM GOALS: Target date: 07/01/2024  Patient will be independent in home exercise program to improve strength/mobility for better functional independence with ADLs. Baseline: to be given on visit 2  5/:22: HEP provided and reviewed since 4/17 7/24: reports that she has not complete for ~ 1 MONTH - reprinted on this day  .    Goal status: IN PROGRESS  LONG TERM GOALS: Target date: 08/19/2024  Patient will increase ABC score to equal to  or greater than   10%  to demonstrate statistically significant improvement in mobility and quality of life.  Baseline: 48.75 5/22: 62.5% 7/24: 56.25%  Goal status: IN PROGRESS  2.  Patient (> 52 years old) will complete five times sit to stand test in < 11 seconds indicating an increased LE strength and improved balance. Baseline: 12.41sec 5/22: 11.91sec without UE support average of 2 trials. 7/24: 14.34 sec  Goal status: IN PROGRESS  3.  Patient will increase 6 min walk test to >1286ft to demonstrate decreased fall risk during functional activities with community mobility/  Baseline: 932ft without AD. No rest break needed.  5/22: 922ft without AD no rest break required.  7/24: 1068 no AD> no rest breaks  Goal status: IN PROGRESS  4.  Patient will increase 10 meter walk test to >1.6m/s as to improve gait speed for better community ambulation and to reduce fall risk. Baseline: 0.25m/s 5/22:Average Normal speed: 0.87 m/s; Average Fast speed: 1.03 m/s 7/24: 0.90m/s no AD  Goal status: IN PROGRESS  5.  Patient will reduce timed up and go to <10 seconds to reduce fall risk and demonstrate improved transfer/gait ability. Baseline: 10.94 5/22: 9.08 sec without UE support  7/24: 12.86 sec without AD   Goal status: IN PROGRESS  6.  Patient will increase dynamic gait index score to >24 as to demonstrate reduced fall risk and improved dynamic gait balance for better safety with community/home ambulation.   Baseline: 20; 06/03/2024= 20 Goal status: IN PROGRESS   ASSESSMENT:  CLINICAL IMPRESSION:   Patient continues to progress with lower extremity strength training this date.  All interventions focused on improving right lower extremity muscular recruitment both in the hip and ankle region specifically but also as well with some quad and hamstring muscle recruitment is required as well.  Patient has a lot of difficulty with lateral step ups as well as with step ups and step over step  pattern but overall displays good billet he to learn and improve on these tasks as she is provided with instruction. Pt will continue to benefit from skilled physical therapy intervention to address impairments, improve QOL, and attain therapy goals.       OBJECTIVE IMPAIRMENTS: Abnormal gait, cardiopulmonary status limiting activity, decreased activity tolerance, decreased balance, decreased coordination,  decreased endurance, decreased knowledge of use of DME, decreased mobility, difficulty walking, decreased ROM, decreased strength, hypomobility, increased edema, increased fascial restrictions, increased muscle spasms, impaired flexibility, impaired sensation, impaired tone, impaired UE functional use, improper body mechanics, postural dysfunction, and pain.   ACTIVITY LIMITATIONS: carrying, lifting, bending, squatting, stairs, transfers, bed mobility, dressing, and locomotion level  PARTICIPATION LIMITATIONS: cleaning, laundry, interpersonal relationship, driving, shopping, community activity, occupation, and yard work  PERSONAL FACTORS: 1-2 comorbidities: hs of CHF and CVA are also affecting patient's functional outcome.   REHAB POTENTIAL: Good  CLINICAL DECISION MAKING: Evolving/moderate complexity  EVALUATION COMPLEXITY: Moderate  PLAN:  PT FREQUENCY: 1-2x/week  PT DURATION: 12 weeks  PLANNED INTERVENTIONS: 97110-Therapeutic exercises, 97530- Therapeutic activity, 97112- Neuromuscular re-education, 97535- Self Care, 02859- Manual therapy, [phone]- Gait training, [phone]- Orthotic Fit/training, [phone]- Splinting, [phone]- Electrical stimulation (unattended), [phone]- Electrical stimulation (manual), Patient/Family education, Balance training, Stair training, Taping, Dry Needling, Joint mobilization, Joint manipulation, Spinal manipulation, Spinal mobilization, Vestibular training, Visual/preceptual remediation/compensation, DME instructions, Cryotherapy, and Moist heat  PLAN FOR NEXT SESSION:    Continue Dynamic balance training.  R ankle and gluteal strengthening  BWSTT?   Lonni KATHEE Gainer PT ,DPT Physical Therapist- [facility]   07/08/24, 3:50 PM

## 2024-07-15 ENCOUNTER — Ambulatory Visit

## 2024-07-15 ENCOUNTER — Ambulatory Visit: Admitting: Physical Therapy

## 2024-07-15 DIAGNOSIS — R262 Difficulty in walking, not elsewhere classified: Secondary | ICD-10-CM

## 2024-07-15 DIAGNOSIS — M6281 Muscle weakness (generalized): Secondary | ICD-10-CM | POA: Diagnosis not present

## 2024-07-15 DIAGNOSIS — R2681 Unsteadiness on feet: Secondary | ICD-10-CM | POA: Diagnosis not present

## 2024-07-15 DIAGNOSIS — R2689 Other abnormalities of gait and mobility: Secondary | ICD-10-CM

## 2024-07-15 DIAGNOSIS — Z7689 Persons encountering health services in other specified circumstances: Secondary | ICD-10-CM | POA: Diagnosis not present

## 2024-07-15 DIAGNOSIS — I6930 Unspecified sequelae of cerebral infarction: Secondary | ICD-10-CM

## 2024-07-15 NOTE — Therapy (Unsigned)
 OUTPATIENT PHYSICAL THERAPY NEURO TREATMENT  Patient Name: Anna Tapia MRN: [id number] DOB:1991-06-15, 33 y.o., female Today's Date: 07/15/2024   PCP: Bernardo Fend, DO  REFERRING PROVIDER: Bernardo Fend, DO   END OF SESSION:  PT End of Session - 07/15/24 1401     Visit Number 16    Number of Visits 24    Date for PT Re-Evaluation 08/19/24    Progress Note Due on Visit 10    PT Start Time 1402    PT Stop Time 1443    PT Time Calculation (min) 41 min    Equipment Utilized During Treatment Gait belt    Activity Tolerance Patient tolerated treatment well;No increased pain    Behavior During Therapy WFL for tasks assessed/performed               Past Medical History:  Diagnosis Date   Arteriovenous malformation of brain    a. s/p remote coiling.   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   Congenital CHF (congestive heart failure) (HCC)    a. 03/2023 Echo: EF 60-65%, no rwma, nl RV fxn, RVSP 29.85mmHg. Mild MR. Mild Ao sclerosis w/o stenosis. Ao root 38mm.   COVID 2022   Hemorrhagic stroke (HCC) 2023   a. L-sided midbrain, thalamic ICH in setting of PCA/ACA aneurysm s/p embolization.   History of broken collarbone 2020   Hydrocephalus (HCC)    a. s/p VP shunt in childhood.   Precordial chest pain    a. 03/2023 MV: No ischemia or infarct.  EF greater than 65%.  No significant coronary calcification.   Sepsis secondary to UTI (HCC) 2021   Past Surgical History:  Procedure Laterality Date   VENTRICULOPERITONEAL SHUNT     Patient Active Problem List   Diagnosis Date Noted   History of CVA with residual deficit 11/25/2023   CHF (congestive heart failure) (HCC) 03/25/2023   Left-sided nontraumatic intracerebral hemorrhage (HCC) 08/01/2022   AVM (arteriovenous malformation) brain 07/24/2022   Asthma 07/24/2022   Major depressive disorder, recurrent episode, moderate (HCC) 02/07/2022   Sepsis secondary to UTI  (HCC) 05/01/2020   VP (ventriculoperitoneal) shunt status 05/01/2020   Right ovarian cyst 05/01/2020   Elevated LFTs 05/01/2020    ONSET DATE: September  2023  REFERRING DIAG:  Diagnosis  I69.30 (ICD-10-CM) - History of CVA with residual deficit    THERAPY DIAG:  Muscle weakness (generalized)  History of CVA with residual deficit  Difficulty in walking, not elsewhere classified  Unsteadiness on feet  Other abnormalities of gait and mobility  Rationale for Evaluation and Treatment: Rehabilitation  SUBJECTIVE:  SUBJECTIVE STATEMENT:  Pt reports doing well today. Pt denies any recent falls/stumbles since prior session. I eager to go to stroke support group.   No medical updates since last PT session.    Pt accompanied by: self  PERTINENT HISTORY:  From recent MD visit:  [name] presents to follow up on chronic medical conditions. Having some knee pain today, had been doing well with PT but her insurance is no longer paying for this until the new year.    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  Also on Lyrica 50 mg BID  -Difficulty using using right hand and right side of face - completed home PT/OT but interested in continuing with therapy outside the home.  Hx of CHF as a newborn: -Had been following with Cardiology at [facility], last seen in 2018 -Last echo 9/23 EF 58% -Denies chest pain, palpitations, shortness of breath or lower  extremity swelling  PAIN:  Are you having pain? Yes: NPRS scale: 4/10 Pain location: R shoulder  Pain description: dull pain  Aggravating factors: moving arm  Relieving factors: n/a   PRECAUTIONS: Fall  RED FLAGS: None   WEIGHT BEARING RESTRICTIONS: No  FALLS: Has patient fallen in last 6 months? No  LIVING ENVIRONMENT: Lives with: lives with their family Lives in: House/apartment Stairs: Yes: External: 2 steps; on right going up and on left going up Has following equipment at home: Single point cane  PLOF: Independent, Independent with basic ADLs, and prior to CVA   PATIENT GOALS: improve walking. Get around better   OBJECTIVE:  Note: Objective measures were completed at Evaluation unless otherwise noted.  DIAGNOSTIC FINDINGS:   CT 2023 IMPRESSION: 1. 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 Again, prominent arterial contribution to the AVM is seen from the left ACA and posterior circulation, with primary venous drainage into the deep venous system. Associated 7 mm aneurysm along the right anterolateral aspect of the AVM as above. 2. Diffuse tortuosity and ectasia elsewhere about the major arterial vasculature of the head and neck. No large vessel occlusion or hemodynamically significant stenosis. No other acute vascular abnormality.    COGNITION: Overall cognitive status: Within functional limits for tasks assessed   SENSATION: Light touch: Impaired  tight feeling on the R foot due to swelling   COORDINATION: Spastic hemiplegia on the R side.   EDEMA:  Circumferential:  distal to knee in sitting R: 41.6CM L 39cm  MUSCLE TONE: RLE: Mild noted increased tone with gait and mobility.   DTRs:  Patella 1 = Trace   POSTURE: rounded shoulders, forward head, and left pelvic obliquity  LOWER EXTREMITY ROM:     Active  Right Eval Left Eval  Hip flexion [facility]  Hip extension [facility]  Hip abduction     Hip adduction    Hip internal rotation    Hip external rotation    Knee flexion 110 WFL  Knee extension [facility]  Ankle dorsiflexion 8 15  Ankle plantarflexion [facility]  Ankle inversion    Ankle eversion     (Blank rows =  not tested)  LOWER EXTREMITY MMT:    03/25/24 MMT Right Eval Left Eval  Hip flexion 4+ 4+  Hip extension    Hip abduction 5 5  Hip adduction 5 5  Hip internal rotation    Hip external rotation    Knee flexion 4+ 5  Knee extension 5 5  Ankle dorsiflexion 4 5  Ankle plantarflexion    Ankle inversion    Ankle eversion    (Blank rows = not tested)  BED MOBILITY:  Sit to supine SBA Supine to sit SBA Rolling to Right SBA Rolling to Left SBA  TRANSFERS: Assistive device utilized: None  Sit to stand: SBA Stand to sit: SBA Chair to chair: SBA Floor: need to assess  CURB:  Level of Assistance: Modified independence and SBA Assistive device utilized: rail  Curb Comments: step to descent   STAIRS: Level of Assistance: Modified independence Stair Negotiation Technique: Step to Pattern with descent and Single Rail on Left Number of Stairs: 4  Height of Stairs: 6  Comments: mild posterior bias on descent. And step to pattern   GAIT: Gait pattern: step through pattern, decreased stance time- Right, Right hip hike, trunk rotated posterior- Left, wide BOS, and poor foot clearance- Right Distance walked: 60 Assistive device utilized: None Level of assistance: Modified independence and SBA Comments: see description  FUNCTIONAL TESTS:  See Goal assessment.   PATIENT SURVEYS:  ABC scale 48.75  See goals assessment                                                                                                                              TREATMENT DATE: 07/15/24  TA- To improve functional movements patterns for everyday tasks   Nustep rolling hills mode level 2-6 x 6 min for B UE and LE reciprocal movement training  Incline RLE eccentric calf  raises 3 x 15 reps with 3# AW   STS with RLE on floor and LLE on airex 2 x 10 reps holding 9-10# ball  RLE step up step to pattern x 10   RLE step up step through pattern 2 x 10     TE- To improve strength, endurance, mobility, and function of specific targeted muscle groups or improve joint range of motion or improve muscle flexibility  Leg press staggered stance to improve R LE muscular recruitment x 3 x 12 at 40#   Seated feet on decline board with GTB across RLE 3 x 15 reps of DF in this position   Seated HS stretch x 45 sec - instruction in supine HS stretch as well.   PATIENT EDUCATION: Education details: POC. Benefits of BWSTT to improve step symmetry of increased repetitions/volume  Person educated: Patient Education method: Explanation and Demonstration Education comprehension: verbalized understanding  HOME EXERCISE PROGRAM: Access Code: [email or web address] URL: [email or web address] Date: 02/19/2024 Prepared by: Massie Dollar  Exercises - Seated March with Ankle Weights at Foot  - 1 x daily - 7  x weekly - 3 sets - 10 reps - Side Stepping with Resistance at Feet  - 1 x daily - 7 x weekly - 3 sets - 10 reps - Tandem Stance  - 1 x daily - 5 x weekly - 3 sets - 4 reps - 20 hold - Staggered Sit-to-Stand  - 1 x daily - 5 x weekly - 2 sets - 5 reps - Sit to Stand with Arms Crossed  - 1 x daily - 5 x weekly - 3 sets - 10 reps - Seated Knee Extension with Resistance  - 1 x daily - 7 x weekly - 3 sets - 10 reps - Seated Hip Abduction with Resistance  - 1 x daily - 7 x weekly - 3 sets - 10 reps - Standing Single Leg Stance with Counter Support  - 1 x daily - 7 x weekly - 3 sets - 10 reps  GOALS: Goals reviewed with patient? Yes  SHORT TERM GOALS: Target date: 07/01/2024  Patient will be independent in home exercise program to improve strength/mobility for better functional independence with ADLs. Baseline: to be given on visit 2  5/:22: HEP provided and reviewed since  4/17 7/24: reports that she has not complete for ~ 1 MONTH - reprinted on this day  .    Goal status: IN PROGRESS  LONG TERM GOALS: Target date: 08/19/2024  Patient will increase ABC score to equal to or greater than   10%  to demonstrate statistically significant improvement in mobility and quality of life.  Baseline: 48.75 5/22: 62.5% 7/24: 56.25%  Goal status: IN PROGRESS  2.  Patient (> 87 years old) will complete five times sit to stand test in < 11 seconds indicating an increased LE strength and improved balance. Baseline: 12.41sec 5/22: 11.91sec without UE support average of 2 trials. 7/24: 14.34 sec  Goal status: IN PROGRESS  3.  Patient will increase 6 min walk test to >1271ft to demonstrate decreased fall risk during functional activities with community mobility/  Baseline: 930ft without AD. No rest break needed.  5/22: 965ft without AD no rest break required.  7/24: 1068 no AD> no rest breaks  Goal status: IN PROGRESS  4.  Patient will increase 10 meter walk test to >1.4m/s as to improve gait speed for better community ambulation and to reduce fall risk. Baseline: 0.57m/s 5/22:Average Normal speed: 0.87 m/s; Average Fast speed: 1.03 m/s 7/24: 0.100m/s no AD  Goal status: IN PROGRESS  5.  Patient will reduce timed up and go to <10 seconds to reduce fall risk and demonstrate improved transfer/gait ability. Baseline: 10.94 5/22: 9.08 sec without UE support  7/24: 12.86 sec without AD   Goal status: IN PROGRESS  6.  Patient will increase dynamic gait index score to >24 as to demonstrate reduced fall risk and improved dynamic gait balance for better safety with community/home ambulation.   Baseline: 20; 06/03/2024= 20 Goal status: IN PROGRESS   ASSESSMENT:  CLINICAL IMPRESSION:  Patient continues to progress with lower extremity strength training this date.  All interventions focused on improving right lower extremity muscular recruitment both in the hip and ankle  region specifically but also as well with some quad and hamstring muscle recruitment is required as well. Pt instructed in HS stretch for improved stiffness after evenings. Also instructed that this can be a result of tone. Pt will continue to benefit from skilled physical therapy intervention to address impairments, improve QOL, and attain therapy goals.   OBJECTIVE IMPAIRMENTS:  Abnormal gait, cardiopulmonary status limiting activity, decreased activity tolerance, decreased balance, decreased coordination, decreased endurance, decreased knowledge of use of DME, decreased mobility, difficulty walking, decreased ROM, decreased strength, hypomobility, increased edema, increased fascial restrictions, increased muscle spasms, impaired flexibility, impaired sensation, impaired tone, impaired UE functional use, improper body mechanics, postural dysfunction, and pain.   ACTIVITY LIMITATIONS: carrying, lifting, bending, squatting, stairs, transfers, bed mobility, dressing, and locomotion level  PARTICIPATION LIMITATIONS: cleaning, laundry, interpersonal relationship, driving, shopping, community activity, occupation, and yard work  PERSONAL FACTORS: 1-2 comorbidities: hs of CHF and CVA are also affecting patient's functional outcome.   REHAB POTENTIAL: Good  CLINICAL DECISION MAKING: Evolving/moderate complexity  EVALUATION COMPLEXITY: Moderate  PLAN:  PT FREQUENCY: 1-2x/week  PT DURATION: 12 weeks  PLANNED INTERVENTIONS: 97110-Therapeutic exercises, 97530- Therapeutic activity, 97112- Neuromuscular re-education, 97535- Self Care, [id number]- Manual therapy, [phone]- Gait training, [phone]- Orthotic Fit/training, [phone]- Splinting, [phone]- Electrical stimulation (unattended), [phone]- Electrical stimulation (manual), Patient/Family education, Balance training, Stair training, Taping, Dry Needling, Joint mobilization, Joint manipulation, Spinal manipulation, Spinal mobilization, Vestibular training,  Visual/preceptual remediation/compensation, DME instructions, Cryotherapy, and Moist heat  PLAN FOR NEXT SESSION:   Continue Dynamic balance training.  R ankle and gluteal strengthening  BWSTT?   Lonni KATHEE Gainer PT ,DPT Physical Therapist- [facility]   07/15/24, 2:05 PM

## 2024-07-16 ENCOUNTER — Other Ambulatory Visit: Payer: Self-pay

## 2024-07-16 DIAGNOSIS — Z419 Encounter for procedure for purposes other than remedying health state, unspecified: Secondary | ICD-10-CM | POA: Diagnosis not present

## 2024-07-16 NOTE — Patient Instructions (Signed)
 Visit Information  Anna Tapia was given information about Medicaid Managed Care team care coordination services as a part of their [facility] Medicaid benefit.   If you would like to schedule transportation through your [facility] plan, please call the following number at least 2 days in advance of your appointment: [phone].   You can also use the MTM portal or MTM mobile app to manage your rides. Reimbursement for transportation is available through [facility]! For the portal, please go to [email or web address]  Call the [facility] Crisis Line at [phone], at any time, 24 hours a day, 7 days a week. If you are in danger or need immediate medical attention call 911.   Anna Tapia - following are the goals we discussed in your visit today:   Goals Addressed             This Visit's Progress    VBCI RN Care Plan - CHF   On track    Problems:  Chronic Disease Management support and education needs related to CHF  Goal: Over the next 90 days the Patient will attend all scheduled medical appointments: Patient will attend all appointments as evidenced by chart review        continue to work with RN Care Manager and/or Social Worker to address care management and care coordination needs related to CHF as evidenced by adherence to care management team scheduled appointments     take all medications exactly as prescribed and will call provider for medication related questions as evidenced by Patient reporting compliance with all medications    verbalize basic understanding of CHF disease process and self health management plan as evidenced by BP and weight checks daily, record and bring to PCP appointments, continue: exercise, low salt cardiac diet, read and follow CHF Action Plan regarding when to call provider and red flags for ED  Interventions:   Heart Failure Interventions: Basic overview and discussion of pathophysiology of Heart Failure reviewed Provided education on low  sodium diet Reviewed Heart Failure Action Plan in depth and provided written copy Advised patient to weigh each morning after emptying bladder - does not check weight regularly, discussed importance Discussed importance of daily weight and advised patient to weigh and record daily Reviewed role of diuretics in prevention of fluid overload and management of heart failure; Discussed the importance of keeping all appointments with provider Provided patient with education about the role of exercise in the management of heart failure Screening for signs and symptoms of depression related to chronic disease state  Assessed social determinant of health barriers  Mailing education - did not check MyChart, agreed to review prior to next appointment  Patient Self-Care Activities:  Attend all scheduled provider appointments Call pharmacy for medication refills 3-7 days in advance of running out of medications Call provider office for new concerns or questions  Take medications as prescribed   Work with the social worker to address care coordination needs and will continue to work with the clinical team to address health care and disease management related needs  Plan:  Telephone follow up appointment with care management team member scheduled for:  08/13/2024 at 10:00 am          VBCI RN Care Plan - HTN   On track    Problems:  Chronic Disease Management support and education needs related to HTN  Goal: Over the next 90 days the Patient will attend all scheduled medical appointments: Patient will attend all appointments as evidenced by chart  review        continue to work with Medical illustrator and/or Social Worker to address care management and care coordination needs related to HTN as evidenced by adherence to care management team scheduled appointments     take all medications exactly as prescribed and will call provider for medication related questions as evidenced by patient reporting  compliance with all medications    verbalize basic understanding of HTN disease process and self health management plan as evidenced by checking and recording BP daily, reporting concerns to provider and recognizing red flags for ED, continue low salt cardiac diet, exercise + Attend Stroke Support Group  +Attend Appointment with LCSW for depression management 07/21/24 at 11:00 am + Reapply for disability - RNCM notified BSW assist requested, has appointment for application   Interventions:   Hypertension Interventions: Last practice recorded BP readings:  BP Readings from Last 3 Encounters:  07/16/24 134/74  07/02/24 132/82  06/22/24 124/76   Most recent eGFR/CrCl:  Lab Results  Component Value Date   EGFR 121 04/02/2024    No components found for: CRCL  Provided education to patient re: stroke prevention, s/s of heart attack and stroke Counseled on the importance of exercise goals with target of 150 minutes per week Advised patient, providing education and rationale, to monitor blood pressure daily and record, calling PCP for findings outside established parameters Provided education on prescribed diet low salt/ cardiac  Discussed complications of poorly controlled blood pressure such as heart disease, stroke, circulatory complications, vision complications, kidney impairment, sexual dysfunction Screening for signs and symptoms of depression related to chronic disease state  Assessed social determinant of health barriers  Patient Self-Care Activities:  Attend all scheduled provider appointments Call pharmacy for medication refills 3-7 days in advance of running out of medications Take medications as prescribed   Work with the social worker to address care coordination needs and will continue to work with the clinical team to address health care and disease management related needs Attend stroke support group Attend LCSW appointment for depression management  Plan:  Telephone  follow up appointment with care management team member scheduled for:  08/13/2024 at 10:00 am             Please see education materials related to CHF, HTN Advance Directive provided as print materials.   The patient verbalized understanding of instructions, educational materials, and care plan provided today and agreed to receive a mailed copy of patient instructions, educational materials, and care plan.   The Managed Medicaid care management team is available to follow up with the patient after provider conversation with the patient regarding recommendation for care management engagement and subsequent re-referral to the care management team.   Anna Duos, MSN, RN [facility]  [facility], [facility] RN Care Manager Direct Dial: [phone] Fax: [phone]   Heart Failure Action Plan A heart failure action plan helps you know what to do when you have symptoms of heart failure. Your action plan is a color-coded plan that lists the symptoms to watch for and indicates what actions to take. If you have symptoms in the green zone, you're doing well. If you have symptoms in the yellow zone, you're having problems. If you have symptoms in the red zone, you need medical care right away. Follow the plan that was created by you and your health care provider. Review your plan each time you visit your provider. Green zone These signs mean you're doing well and can  continue what you're doing: You don't have new or worsening shortness of breath. You have very little swelling or no new swelling. Your weight is stable (no gain or loss). You have a normal activity level. You don't have chest pain or any other new symptoms. Yellow zone These signs and symptoms mean your condition may be getting worse and you should make some changes: You have trouble breathing when you're active. You have swelling in your feet or legs or have discomfort in your belly. You gain  2-3 lb (0.9-1.4 kg) in 24 hours, or 5 lb (2.3 kg) in a week. This amount may be more or less depending on your condition. You get tired easily. You have trouble sleeping. You have a dry cough. If you have any of these symptoms: Contact your provider within the next day. Your provider may adjust your medicines. Red zone These signs and symptoms mean you should get medical help right away: You have trouble breathing when resting or cannot lie flat and you need to raise your head to help you breathe. You have a dry cough that's getting worse. You have swelling or pain in your feet or legs or discomfort in your belly that's getting worse. You suddenly gain more than 2-3 lb (0.9-1.4 kg) in 24 hours, or more than 5 lb (2.3 kg) in a week. This amount may be more or less depending on your condition. You have trouble staying awake or you feel confused. You don't have an appetite. You have worsening sadness or depression. These symptoms may be an emergency. Call 911 right away. Do not wait to see if the symptoms will go away. Do not drive yourself to the hospital. Follow these instructions at home: Take medicines only as told. Eat a heart-healthy diet. Work with a dietitian to create an eating plan that's best for you. Weigh yourself each day. Your target weight is __________ lb (__________ kg). Call your provider if you gain more than __________ lb (__________ kg) in 24 hours, or more than __________ lb (__________ kg) in a week. Health care provider name: _____________________________________________________ Health care provider phone number: _____________________________________________________ Where to find more information American Heart Association: heart.org This information is not intended to replace advice given to you by your health care provider. Make sure you discuss any questions you have with your health care provider. Document Revised: 06/05/2023 Document Reviewed:  06/05/2023 Elsevier Patient Education  2024 Elsevier Inc.  Managing Your Hypertension Hypertension, also called high blood pressure, is when the force of the blood pressing against the walls of the arteries is too strong. Arteries are blood vessels that carry blood from your heart throughout your body. Hypertension forces the heart to work harder to pump blood and may cause the arteries to become narrow or stiff. Understanding blood pressure readings A blood pressure reading includes a higher number over a lower number: The first, or top, number is called the systolic pressure. It is a measure of the pressure in your arteries as your heart beats. The second, or bottom number, is called the diastolic pressure. It is a measure of the pressure in your arteries as the heart relaxes. For most people, a normal blood pressure is below 120/80. Your personal target blood pressure may vary depending on your medical conditions, your age, and other factors. Blood pressure is classified into four stages. Based on your blood pressure reading, your health care provider may use the following stages to determine what type of treatment you need, if any.  Systolic pressure and diastolic pressure are measured in a unit called millimeters of mercury (mmHg). Normal Systolic pressure: below 120. Diastolic pressure: below 80. Elevated Systolic pressure: 120-129. Diastolic pressure: below 80. Hypertension stage 1 Systolic pressure: 130-139. Diastolic pressure: 80-89. Hypertension stage 2 Systolic pressure: 140 or above. Diastolic pressure: 90 or above. How can this condition affect me? Managing your hypertension is very important. Over time, hypertension can damage the arteries and decrease blood flow to parts of the body, including the brain, heart, and kidneys. Having untreated or uncontrolled hypertension can lead to: A heart attack. A stroke. A weakened blood vessel (aneurysm). Heart failure. Kidney  damage. Eye damage. Memory and concentration problems. Vascular dementia. What actions can I take to manage this condition? Hypertension can be managed by making lifestyle changes and possibly by taking medicines. Your health care provider will help you make a plan to bring your blood pressure within a normal range. You may be referred for counseling on a healthy diet and physical activity. Nutrition  Eat a diet that is high in fiber and potassium, and low in salt (sodium), added sugar, and fat. An example eating plan is called the DASH diet. DASH stands for Dietary Approaches to Stop Hypertension. To eat this way: Eat plenty of fresh fruits and vegetables. Try to fill one-half of your plate at each meal with fruits and vegetables. Eat whole grains, such as whole-wheat pasta, brown rice, or whole-grain bread. Fill about one-fourth of your plate with whole grains. Eat low-fat dairy products. Avoid fatty cuts of meat, processed or cured meats, and poultry with skin. Fill about one-fourth of your plate with lean proteins such as fish, chicken without skin, beans, eggs, and tofu. Avoid pre-made and processed foods. These tend to be higher in sodium, added sugar, and fat. Reduce your daily sodium intake. Many people with hypertension should eat less than 1,500 mg of sodium a day. Lifestyle  Work with your health care provider to maintain a healthy body weight or to lose weight. Ask what an ideal weight is for you. Get at least 30 minutes of exercise that causes your heart to beat faster (aerobic exercise) most days of the week. Activities may include walking, swimming, or biking. Include exercise to strengthen your muscles (resistance exercise), such as weight lifting, as part of your weekly exercise routine. Try to do these types of exercises for 30 minutes at least 3 days a week. Do not use any products that contain nicotine or tobacco. These products include cigarettes, chewing tobacco, and vaping  devices, such as e-cigarettes. If you need help quitting, ask your health care provider. Control any long-term (chronic) conditions you have, such as high cholesterol or diabetes. Identify your sources of stress and find ways to manage stress. This may include meditation, deep breathing, or making time for fun activities. Alcohol use Do not drink alcohol if: Your health care provider tells you not to drink. You are pregnant, may be pregnant, or are planning to become pregnant. If you drink alcohol: Limit how much you have to: 0-1 drink a day for women. 0-2 drinks a day for men. Know how much alcohol is in your drink. In the [place] one drink equals one 12 oz bottle of beer (355 mL), one 5 oz glass of wine (148 mL), or one 1 oz glass of hard liquor (44 mL). Medicines Your health care provider may prescribe medicine if lifestyle changes are not enough to get your blood pressure under control and if: Your  systolic blood pressure is 130 or higher. Your diastolic blood pressure is 80 or higher. Take medicines only as told by your health care provider. Follow the directions carefully. Blood pressure medicines must be taken as told by your health care provider. The medicine does not work as well when you skip doses. Skipping doses also puts you at risk for problems. Monitoring Before you monitor your blood pressure: Do not smoke, drink caffeinated beverages, or exercise within 30 minutes before taking a measurement. Use the bathroom and empty your bladder (urinate). Sit quietly for at least 5 minutes before taking measurements. Monitor your blood pressure at home as told by your health care provider. To do this: Sit with your back straight and supported. Place your feet flat on the floor. Do not cross your legs. Support your arm on a flat surface, such as a table. Make sure your upper arm is at heart level. Each time you measure, take two or three readings one minute apart and record the  results. You may also need to have your blood pressure checked regularly by your health care provider. General information Talk with your health care provider about your diet, exercise habits, and other lifestyle factors that may be contributing to hypertension. Review all the medicines you take with your health care provider because there may be side effects or interactions. Keep all follow-up visits. Your health care provider can help you create and adjust your plan for managing your high blood pressure. Where to find more information National Heart, Lung, and Blood Institute: PopSteam.is American Heart Association: www.heart.org Contact a health care provider if: You think you are having a reaction to medicines you have taken. You have repeated (recurrent) headaches. You feel dizzy. You have swelling in your ankles. You have trouble with your vision. Get help right away if: You develop a severe headache or confusion. You have unusual weakness or numbness, or you feel faint. You have severe pain in your chest or abdomen. You vomit repeatedly. You have trouble breathing. These symptoms may be an emergency. Get help right away. Call 911. Do not wait to see if the symptoms will go away. Do not drive yourself to the hospital. Summary Hypertension is when the force of blood pumping through your arteries is too strong. If this condition is not controlled, it may put you at risk for serious complications. Your personal target blood pressure may vary depending on your medical conditions, your age, and other factors. For most people, a normal blood pressure is less than 120/80. Hypertension is managed by lifestyle changes, medicines, or both. Lifestyle changes to help manage hypertension include losing weight, eating a healthy, low-sodium diet, exercising more, stopping smoking, and limiting alcohol. This information is not intended to replace advice given to you by your health care  provider. Make sure you discuss any questions you have with your health care provider. Document Revised: 07/05/2021 Document Reviewed: 07/05/2021 Elsevier Patient Education  2024 [employer].   Advance Directive  Advance directives are legal papers that state your wishes about health care decisions. They let your wishes be known to family, friends, and health care providers if you become unable to speak for yourself.  You should write these papers out over time rather than all at once. They can be changed and updated at any time. The types of advance directives include: Medical power of attorney (POA). Living wills. Do not resuscitate (DNR) or do not attempt resuscitation (DNAR) orders. What are a health care proxy and  medical POA? A health care proxy is also called a health care agent. It's a person you choose to make medical decisions for you when you can't make them for yourself. In most cases, a proxy is a trusted friend or family member. A medical POA is legal paperwork that names your proxy. It may need to be: Signed. Notarized. Dated. Copied. Witnessed. Added to your medical record. You may also want to choose someone to handle your money if you can't do so. This is called a durable POA for finances. It's separate from a medical POA. You may choose your health care proxy or someone else to act as your agent in money matters. If you don't have a proxy, or if the proxy may not be acting in your best interest, a court may choose a guardian to act on your behalf. What is a living will? A living will is legal paperwork that states your wishes about medical care. Providers should keep a copy of it in your medical record. You may want to give a copy to family members or friends. You can also keep a card in your wallet to let loved ones know you have a living will and where they can find it. A living will may be used if: You're very sick with something that will end your life. You become  disabled. You can't make decisions or speak for yourself. Your living will should include whether: To use or not use life support equipment. This may include machines to filter your blood or to help you breathe. You want a DNR or DNAR order. This tells providers not to use CPR if your heart or breathing stops. To use or not use tube feeding. You want to be given foods and fluids. You want a type of comfort care called palliative care. This may be given when the goal for treatment becomes comfort rather than a cure. You want to donate your organs and tissues. A living will doesn't say what to do with your money and property if you pass away. What is a DNR or DNAR? A DNR or DNAR order is a request not to have CPR. If you don't have one of these orders, a provider will try to help you if your heart stops or you stop breathing.  If you plan to have surgery, talk with your provider about your DNR or DNAR order. What happens if I don't have an advance directive? Each state has its own laws about advance directives. Some states assign family decision makers to act on your behalf if you don't have an advance directive.  Check with your provider, attorney, or state representative about the laws in your state. Where to find more information Each state has its own laws about advance directives. You can look up these laws at: https://rodriguez-phillips.com/ This information is not intended to replace advice given to you by your health care provider. Make sure you discuss any questions you have with your health care provider. Document Revised: 03/10/2023 Document Reviewed: 03/10/2023 Elsevier Patient Education  2024 [employer].

## 2024-07-16 NOTE — Patient Outreach (Signed)
 Complex Care Management   Visit Note  07/16/2024  Name:  Anna Tapia MRN: [id number] DOB: [birth year]  Situation: Referral received for Complex Care Management related to Heart Failure and HTN I obtained verbal consent from Patient.  Visit completed with Patient  on the phone  Background:   Past Medical History:  Diagnosis Date   Arteriovenous malformation of brain    a. s/p remote coiling.   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   Congenital CHF (congestive heart failure) (HCC)    a. 03/2023 Echo: EF 60-65%, no rwma, nl RV fxn, RVSP 29.73mmHg. Mild MR. Mild Ao sclerosis w/o stenosis. Ao root 38mm.   COVID 2022   Hemorrhagic stroke (HCC) 2023   a. L-sided midbrain, thalamic ICH in setting of PCA/ACA aneurysm s/p embolization.   History of broken collarbone 2020   Hydrocephalus (HCC)    a. s/p VP shunt in childhood.   Precordial chest pain    a. 03/2023 MV: No ischemia or infarct.  EF greater than 65%.  No significant coronary calcification.   Sepsis secondary to UTI [facility]) 2021    Assessment: Patient Reported Symptoms:  Cognitive        Neurological      HEENT        Cardiovascular Cardiovascular Symptoms Reported: Dizziness Cardiovascular Comment: dizziness walking if looks up or down, no falls, no HF symptoms, discussed importance of daily weights and BPs - will Mail CHF AP and HTN info - did not check MyCHART  Respiratory Respiratory Symptoms Reported: Dry cough Other Respiratory Symptoms: cough with lisinopril  - manageable Respiratory Management Strategies: Routine screening, Medication therapy  Endocrine      Gastrointestinal Gastrointestinal Symptoms Reported: No symptoms reported      Genitourinary      Integumentary      Musculoskeletal Additional Musculoskeletal Details: increased pain in knees - hard to straighten and walk in am, R>L at times, requesting sooner appt with PCP, no falls, taking baclofen  as ordered,  tylenol  not helping, does stretches from PT   Falls in the past year?: No Number of falls in past year: 1 or less Was there an injury with Fall?: No Fall Risk Category Calculator: 0 Patient Fall Risk Level: Low Fall Risk Patient at Risk for Falls Due to: History of fall(s), Impaired balance/gait, Impaired mobility Fall risk Follow up: Falls evaluation completed, Falls prevention discussed  Psychosocial Psychosocial Symptoms Reported: Sadness - if selected complete PHQ 2-9 Additional Psychological Details: sad, does not notice any progress and this is baseline, will not improve, trying every day Behavioral Management Strategies: Support group, Support system Behavioral Health Comment: stroke support group Monday 07/19/24 - last one was missed due to transportation did not come Major Change/Loss/Stressor/Fears (CP): Medical condition, self Techniques to Cope with Loss/Stress/Change: Medication      07/16/2024    PHQ2-9 Depression Screening   Little interest or pleasure in doing things Not at all  Feeling down, depressed, or hopeless More than half the days  PHQ-2 - Total Score 2  Trouble falling or staying asleep, or sleeping too much    Feeling tired or having little energy    Poor appetite or overeating     Feeling bad about yourself - or that you are a failure or have let yourself or your family down    Trouble concentrating on things, such as reading the newspaper or watching television    Moving or speaking  so slowly that other people could have noticed.  Or the opposite - being so fidgety or restless that you have been moving around a lot more than usual    Thoughts that you would be better off dead, or hurting yourself in some way    PHQ2-9 Total Score    If you checked off any problems, how difficult have these problems made it for you to do your work, take care of things at home, or get along with other people    Depression Interventions/Treatment      Vitals:   07/16/24 1046   BP: 134/74    Medications Reviewed Today     Reviewed by Devra Lands, RN (Registered Nurse) on 07/16/24 at 1044  Med List Status: <None>   Medication Order Taking? Sig Documenting Provider Last Dose Status Informant  albuterol  (VENTOLIN  HFA) 108 (90 Base) MCG/ACT inhaler [id number] Yes Inhale 2 puffs into the lungs every 6 (six) hours as needed for wheezing or shortness of breath. Bernardo Fend, DO  Active   baclofen  (LIORESAL ) 10 MG tablet [id number] Yes TAKE ONE-HALF TABLET BY MOUTH IN THE MORNING, ONE TABLET AT LUNCH, AND 2 TABLETS AT ADA Bernardo Fend, DO  Active   Blood Pressure Monitoring (ADULT BLOOD PRESSURE CUFF LG) KIT [id number] Yes 1 each by Does not apply route daily. Bernardo Fend, DO  Active   DULoxetine  (CYMBALTA ) 20 MG capsule [id number] Yes Take 2 capsules by mouth once daily Bernardo Fend, DO  Active   famotidine  (PEPCID ) 20 MG tablet [id number]  Take 1 tablet (20 mg total) by mouth 2 (two) times daily.  Patient not taking: Reported on 07/02/2024   Viviann Pastor, MD  Active Pharmacy Records, Multiple Informants           Med Note GROVER BURNARD GORMAN Stevan Jul 24, 2022  7:57 PM) 07/03/2022 20 MG TABS (disp 60, 30d supply)   fluticasone -salmeterol (ADVAIR) 100-50 MCG/ACT AEPB [id number] Yes Inhale 1 puff into the lungs 2 (two) times daily. Bernardo Fend, DO  Active   lisinopril  (ZESTRIL ) 5 MG tablet [id number] Yes Take 1 tablet (5 mg total) by mouth daily. Bernardo Fend, DO  Active   pregabalin (LYRICA) 50 MG capsule [id number] Yes Take 50 mg by mouth 2 (two) times daily. [provider]  Active             Recommendation:   PCP Follow-up Continue Current Plan of Care  Follow Up Plan:   Telephone follow-up in 1 month  Lands Devra, MSN, RN [facility]  [facility], [facility] RN Care Manager Direct Dial: [phone] Fax: [phone]

## 2024-07-19 ENCOUNTER — Encounter: Payer: Self-pay | Admitting: Internal Medicine

## 2024-07-19 ENCOUNTER — Other Ambulatory Visit: Payer: Self-pay

## 2024-07-19 ENCOUNTER — Ambulatory Visit (INDEPENDENT_AMBULATORY_CARE_PROVIDER_SITE_OTHER): Admitting: Internal Medicine

## 2024-07-19 VITALS — BP 126/72 | HR 92 | Temp 98.2°F | Resp 16 | Ht 60.0 in | Wt 174.9 lb

## 2024-07-19 DIAGNOSIS — M25561 Pain in right knee: Secondary | ICD-10-CM | POA: Diagnosis not present

## 2024-07-19 DIAGNOSIS — Z7689 Persons encountering health services in other specified circumstances: Secondary | ICD-10-CM | POA: Diagnosis not present

## 2024-07-19 NOTE — Patient Instructions (Signed)
 Visit Information  Ms. Cazarez was given information about Medicaid Managed Care team care coordination services as a part of their [facility] Medicaid benefit.   If you would like to schedule transportation through your [facility] plan, please call the following number at least 2 days in advance of your appointment: [phone].   You can also use the MTM portal or MTM mobile app to manage your rides. Reimbursement for transportation is available through [facility]! For the portal, please go to [email or web address]  Call the [facility] Crisis Line at [phone], at any time, 24 hours a day, 7 days a week. If you are in danger or need immediate medical attention call 911.   Ms. Crigler - following are the goals we discussed in your visit today:   Goals Addressed             This Visit's Progress    COMPLETED: BSW VBCI Social Work Care Plan       Problems:   Food Insecurity   CSW Clinical Goal(s):   Over the next 30 days the Patient will will follow up with food resources as directed by Social Work.  Interventions:  SW emailed food resources to email address on file.  Patient Goals/Self-Care Activities:  Patient will contact food resources  Plan:   The patient has been provided with contact information for the care management team and has been advised to call with any health related questions or concerns.         The  Patient                                              has been provided with contact information for the Managed Medicaid care management team and has been advised to call with any health related questions or concerns.   Thersia Hoar, HEDWIG, MHA [place]  [facility] Social Worker, Population Health [phone]   Following is a copy of your plan of care:  There are no care plans that you recently modified to display for this patient.

## 2024-07-19 NOTE — Patient Outreach (Signed)
 Complex Care Management   Visit Note  07/19/2024  Name:  Anna Tapia MRN: [id number] DOB: [birth year]  Situation: Referral received for Complex Care Management related to SDOH Barriers:  Food insecurity Pest control I obtained verbal consent from Patient.  Visit completed with Patient  on the phone  Background:   Past Medical History:  Diagnosis Date   Arteriovenous malformation of brain    a. s/p remote coiling.   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   Congenital CHF (congestive heart failure) (HCC)    a. 03/2023 Echo: EF 60-65%, no rwma, nl RV fxn, RVSP 29.3mmHg. Mild MR. Mild Ao sclerosis w/o stenosis. Ao root 38mm.   COVID 2022   Hemorrhagic stroke (HCC) 2023   a. L-sided midbrain, thalamic ICH in setting of PCA/ACA aneurysm s/p embolization.   History of broken collarbone 2020   Hydrocephalus (HCC)    a. s/p VP shunt in childhood.   Precordial chest pain    a. 03/2023 MV: No ischemia or infarct.  EF greater than 65%.  No significant coronary calcification.   Sepsis secondary to UTI [facility]) 2021    Assessment: SW completed a telephone outreach with patient, she did receive the food resources SW sent. Patient states pest control did come out. No additional resources are needed at this time. SW provided patient with contact information for any future needs.  SDOH Interventions    Flowsheet Row Patient Outreach Telephone from 07/02/2024 in [place] POPULATION HEALTH DEPARTMENT Patient Outreach Telephone from 06/28/2024 in [place] HE[facility] POPULATION HEALTH DEPARTMENT Office Visit from 07/10/2023 in [facility] Office Visit from 02/24/2023 in [facility]  SDOH Interventions      Food Insecurity Interventions Intervention Not Indicated  [Spoke with BSW and received resources - left messages, will check [facility] for food benefit] Community Resources Provided -- --  Housing Interventions  Intervention Not Indicated -- -- --  Transportation Interventions Intervention Not Indicated  [Transportation thru [employer] -- -- --  Utilities Interventions Intervention Not Indicated -- -- --  Depression Interventions/Treatment  -- -- Medication Medication      Recommendation:   No recommendations at this time  Follow Up Plan:   Patient has met all care management goals. Care Management case will be closed. Patient has been provided contact information should new needs arise.   Thersia Hoar, HEDWIG, MHA [place]  [facility] Social Worker, Population Health [phone]

## 2024-07-19 NOTE — Progress Notes (Signed)
 Acute Office Visit  Subjective:     Patient ID: Anna Tapia, female    DOB: [birth year], 33 y.o.   MRN: [id number]  Chief Complaint  Patient presents with   Knee Pain    Bilateral knee pain, right worse then left   Hypertension    BP is normal but has a cough on new medication    Knee Pain   Hypertension   Patient is in today for bilateral knee pain.   Discussed the use of AI scribe software for clinical note transcription with the patient, who gave verbal consent to proceed.  History of Present Illness Anna Tapia Anna Tapia is a 33 year old female who presents with bilateral knee pain and stiffness, particularly in the mornings.  She experiences severe aching pain and stiffness in her knees, especially upon waking, which makes it difficult to straighten her legs. The pain is more frequent in one knee than the other and impacts her ability to walk and perform daily activities. There is no recent trauma, falls, or injuries to her knees, and no popping or dislocation is noted. The pain is not constant and does not occur every morning. There is no significant swelling during these episodes, and the pain does not occur at other times of the day. She has not used any topical medications like Voltaren gel. She has not engaged in new or strenuous physical activities and reports occasional knee weakness. She denies any sensation of her kneecap not moving correctly and sometimes sleeps on her side with her knees bent, which may contribute to morning stiffness.   Review of Systems  Musculoskeletal:  Positive for joint pain.        Objective:    BP 126/72 (Cuff Size: Large)   Pulse 92   Temp 98.2 F (36.8 C) (Oral)   Resp 16   Ht 5' (1.524 m)   Wt 174 lb 14.4 oz (79.3 kg)   SpO2 98%   BMI 34.16 kg/m  BP Readings from Last 3 Encounters:  07/19/24 126/72  07/16/24 134/74  07/02/24 132/82   Wt Readings from Last 3 Encounters:  07/19/24 174 lb 14.4 oz (79.3 kg)   06/22/24 173 lb 12.8 oz (78.8 kg)  05/25/24 176 lb 14.4 oz (80.2 kg)      Physical Exam Constitutional:      Appearance: Normal appearance.  HENT:     Head: Normocephalic and atraumatic.  Eyes:     Conjunctiva/sclera: Conjunctivae normal.  Cardiovascular:     Rate and Rhythm: Normal rate and regular rhythm.  Pulmonary:     Effort: Pulmonary effort is normal.     Breath sounds: Normal breath sounds.  Musculoskeletal:     Right knee: Swelling present. No bony tenderness. Normal range of motion. No tenderness. No LCL laxity, MCL laxity, ACL laxity or PCL laxity. Normal meniscus and normal patellar mobility.     Instability Tests: Anterior drawer test negative. Posterior drawer test negative. Anterior Lachman test negative.     Left knee: Normal. No bony tenderness. Normal range of motion. No tenderness. No LCL laxity, MCL laxity, ACL laxity or PCL laxity.Normal meniscus and normal patellar mobility.     Instability Tests: Anterior drawer test negative. Posterior drawer test negative. Anterior Lachman test negative.  Skin:    General: Skin is warm and dry.  Neurological:     General: No focal deficit present.     Mental Status: She is alert. Mental status is at baseline.  Psychiatric:  Mood and Affect: Mood normal.        Behavior: Behavior normal.     No results found for any visits on 07/19/24.      Assessment & Plan:   Assessment & Plan Bilateral knee pain and stiffness Intermittent bilateral knee pain and stiffness, more pronounced in the right knee, primarily upon waking. Differential includes functional pain, possible effusion, or early degenerative changes. No ligamentous injury or dislocation noted. - Order x-ray of the right knee to assess joint space, patella alignment, and rule out fluid accumulation or degenerative changes. - Advise use of a body pillow to prevent knee flexion during sleep. - Recommend topical anti-inflammatory medication (Voltaren gel) for  acute pain episodes. - Suggest oral anti-inflammatory medications (Aleve  or ibuprofen ) for severe pain episodes. - Discuss potential referral to physical therapy if structural issues are identified.  - DG Knee Complete 4 Views Right; Future   Return for already scheduled.  Sharyle Fischer, DO

## 2024-07-21 ENCOUNTER — Other Ambulatory Visit: Payer: Self-pay | Admitting: *Deleted

## 2024-07-21 ENCOUNTER — Encounter: Payer: Self-pay | Admitting: Internal Medicine

## 2024-07-21 DIAGNOSIS — Z7689 Persons encountering health services in other specified circumstances: Secondary | ICD-10-CM | POA: Diagnosis not present

## 2024-07-21 NOTE — Patient Instructions (Addendum)
 Visit Information  Ms. Dante was given information about Medicaid Managed Care team care coordination services as a part of their [facility] Medicaid benefit.   If you would like to schedule transportation through your [facility] plan, please call the following number at least 2 days in advance of your appointment: [phone].   You can also use the MTM portal or MTM mobile app to manage your rides. Reimbursement for transportation is available through [facility]! For the portal, please go to [email or web address]  Call the [facility] Crisis Line at [phone], at any time, 24 hours a day, 7 days a week. If you are in danger or need immediate medical attention call 911.   Ms. Sarra - following are the goals we discussed in your visit today:   Goals Addressed             This Visit's Progress    VBCI Social Work Care Plan       Problems:   Lacks knowledge of how to connect to ongoing mental health support  CSW Clinical Goal(s):   Over the next 90 days the Patient will will follow up with [facility] as directed by Social Work.  Interventions:  Mental Health:  Evaluation of current treatment plan related to Health issues since birth, stroke 2 years ago Active listening / Reflection utilized Motivational Interviewing employed Participation in counseling encourage : referral completed([facility]) for ongoing mental health support Patient also attending attending support group  PHQ2/PHQ9 completed GAD 7 completed Solution-Focued Strategies employed: discussed continued participation in support group and ongoing mental health support through [facility]  Patient Goals/Self-Care Activities:  Continue with therapy [facility].  Plan:   Telephone follow up appointment with care management team member scheduled for:  08/11/24         Patient verbalizes understanding of instructions and care plan provided today and agrees to view in MyChart.  Active MyChart status and patient understanding of how to access instructions and care plan via MyChart confirmed with patient.     Licensed Clinical Social Worker will follow up 08/13/24 10am   Wael Maestas, LCSW [place]  [facility], [facility] Licensed Clinical Social Worker  Direct Dial: [phone]     Following is a copy of your plan of care:  There are no care plans that you recently modified to display for this patient.

## 2024-07-21 NOTE — Patient Outreach (Signed)
 Complex Care Management   Visit Note  07/21/2024  Name:  Anna Tapia MRN: [id number] DOB: July 13, 1991  Situation: Referral received for Complex Care Management related to Mental/Behavioral Health diagnosis depression I obtained verbal consent from Patient.  Visit completed with Patient  on the phone  Background:   Past Medical History:  Diagnosis Date   Arteriovenous malformation of brain    a. s/p remote coiling.   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   Congenital CHF (congestive heart failure) (HCC)    a. 03/2023 Echo: EF 60-65%, no rwma, nl RV fxn, RVSP 29.42mmHg. Mild MR. Mild Ao sclerosis w/o stenosis. Ao root 38mm.   COVID 2022   Hemorrhagic stroke (HCC) 2023   a. L-sided midbrain, thalamic ICH in setting of PCA/ACA aneurysm s/p embolization.   History of broken collarbone 2020   Hydrocephalus (HCC)    a. s/p VP shunt in childhood.   Precordial chest pain    a. 03/2023 MV: No ischemia or infarct.  EF greater than 65%.  No significant coronary calcification.   Sepsis secondary to UTI [facility]) 2021    Assessment: Patient Reported Symptoms:  Cognitive Cognitive Status: Struggling with memory recall, Alert and oriented to person, place, and time, Insightful and able to interpret abstract concepts Cognitive/Intellectual Conditions Management [RPT]: Other (Brain Injury) Brain Injury: Stroke 2 years ago   Health Maintenance Behaviors: Annual physical exam, Exercise, Healthy diet Healing Pattern: Slow Health Facilitated by: Healthy diet  Neurological Neurological Review of Symptoms: Not assessed    HEENT HEENT Symptoms Reported: No symptoms reported      Cardiovascular Cardiovascular Symptoms Reported: Not assessed    Respiratory Respiratory Symptoms Reported: Not assesed    Endocrine Endocrine Symptoms Reported: No symptoms reported    Gastrointestinal Gastrointestinal Symptoms Reported: No symptoms reported       Genitourinary Genitourinary Symptoms Reported: No symptoms reported    Integumentary Integumentary Symptoms Reported: No symptoms reported    Musculoskeletal Musculoskelatal Symptoms Reviewed: Weakness, Muscle pain Additional Musculoskeletal Details: continued pain in knees Musculoskeletal Management Strategies: Adequate rest, Medication therapy      Psychosocial Additional Psychological Details: depression not as bad as it once was-medicine is helping, would like ongoing counseling Behavioral Management Strategies: Medication therapy, Support system Behavioral Health Comment: Active with stroke support group-1x per month Major Change/Loss/Stressor/Fears (CP): Medical condition, self Behaviors When Feeling Stressed/Fearful: family in general helps out alot, support group, Techniques to [employer] with Loss/Stress/Change: Support group, Medication Quality of Family Relationships: supportive, helpful    07/21/2024    PHQ2-9 Depression Screening   Little interest or pleasure in doing things Not at all  Feeling down, depressed, or hopeless Several days  PHQ-2 - Total Score 1  Trouble falling or staying asleep, or sleeping too much    Feeling tired or having little energy    Poor appetite or overeating     Feeling bad about yourself - or that you are a failure or have let yourself or your family down    Trouble concentrating on things, such as reading the newspaper or watching television    Moving or speaking so slowly that other people could have noticed.  Or the opposite - being so fidgety or restless that you have been moving around a lot more than usual    Thoughts that you would be better off dead, or hurting yourself in some way    PHQ2-9 Total Score  If you checked off any problems, how difficult have these problems made it for you to do your work, take care of things at home, or get along with other people    Depression Interventions/Treatment      There were no vitals filed for  this visit.  Medications Reviewed Today     Reviewed by Ermalinda Lenn HERO, LCSW (Social Worker) on 07/21/24 at 1411  Med List Status: <None>   Medication Order Taking? Sig Documenting Provider Last Dose Status Informant  albuterol  (VENTOLIN  HFA) 108 (90 Base) MCG/ACT inhaler [id number]  Inhale 2 puffs into the lungs every 6 (six) hours as needed for wheezing or shortness of breath. Bernardo Fend, DO  Active   baclofen  (LIORESAL ) 10 MG tablet [id number]  TAKE ONE-HALF TABLET BY MOUTH IN THE MORNING, ONE TABLET AT LUNCH, AND 2 TABLETS AT ADA Bernardo Fend, DO  Active   Blood Pressure Monitoring (ADULT BLOOD PRESSURE CUFF LG) KIT [id number]  1 each by Does not apply route daily. Bernardo Fend, DO  Active   DULoxetine  (CYMBALTA ) 20 MG capsule [id number]  Take 2 capsules by mouth once daily Bernardo Fend, DO  Active   famotidine  (PEPCID ) 20 MG tablet [id number]  Take 1 tablet (20 mg total) by mouth 2 (two) times daily.  Patient not taking: Reported on 07/02/2024   Viviann Pastor, MD  Active Pharmacy Records, Multiple Informants           Med Note GROVER, BURNARD GORMAN [place] Jul 24, 2022  7:57 PM) 07/03/2022 20 MG TABS (disp 60, 30d supply)   fluticasone -salmeterol (ADVAIR) 100-50 MCG/ACT AEPB [id number]  Inhale 1 puff into the lungs 2 (two) times daily. Bernardo Fend, DO  Active   lisinopril  (ZESTRIL ) 5 MG tablet [id number]  Take 1 tablet (5 mg total) by mouth daily. Bernardo Fend, DO  Active   pregabalin (LYRICA) 50 MG capsule [id number]  Take 50 mg by mouth 2 (two) times daily. [provider]  Active             Recommendation:   PCP Follow-up Specialty provider follow-up as scheduled Continued Monthly Stroke Support group Follow up with [facility] counseling   Follow Up Plan:   Telephone follow up appointment date/time:  08/11/24  Lenn Ermalinda, LCSW [place]  [facility], [facility] Licensed Clinical Social Worker  Direct  Dial: [phone]

## 2024-07-22 ENCOUNTER — Ambulatory Visit: Admitting: Physical Therapy

## 2024-07-22 ENCOUNTER — Ambulatory Visit

## 2024-07-22 ENCOUNTER — Other Ambulatory Visit: Payer: Self-pay | Admitting: Internal Medicine

## 2024-07-22 DIAGNOSIS — I10 Essential (primary) hypertension: Secondary | ICD-10-CM

## 2024-07-22 MED ORDER — LOSARTAN POTASSIUM 25 MG PO TABS: 25.0000 mg | ORAL_TABLET | Freq: Every day | ORAL | 1 refills | Status: DC

## 2024-07-22 NOTE — Telephone Encounter (Signed)
 Patient notified

## 2024-07-23 ENCOUNTER — Ambulatory Visit
Admission: RE | Admit: 2024-07-23 | Discharge: 2024-07-23 | Disposition: A | Discharge status: Home / Self Care | Source: Ambulatory Visit | Attending: Internal Medicine | Admitting: Internal Medicine

## 2024-07-23 ENCOUNTER — Ambulatory Visit
Admission: RE | Admit: 2024-07-23 | Discharge: 2024-07-23 | Disposition: A | Discharge status: Home / Self Care | Attending: Internal Medicine | Admitting: Internal Medicine

## 2024-07-23 DIAGNOSIS — M25561 Pain in right knee: Secondary | ICD-10-CM

## 2024-07-23 DIAGNOSIS — M1711 Unilateral primary osteoarthritis, right knee: Secondary | ICD-10-CM | POA: Diagnosis not present

## 2024-07-26 DIAGNOSIS — Z7689 Persons encountering health services in other specified circumstances: Secondary | ICD-10-CM | POA: Diagnosis not present

## 2024-07-29 ENCOUNTER — Ambulatory Visit: Admitting: Physical Therapy

## 2024-07-29 ENCOUNTER — Ambulatory Visit

## 2024-07-29 DIAGNOSIS — R2681 Unsteadiness on feet: Secondary | ICD-10-CM

## 2024-07-29 DIAGNOSIS — I6930 Unspecified sequelae of cerebral infarction: Secondary | ICD-10-CM | POA: Diagnosis not present

## 2024-07-29 DIAGNOSIS — R2689 Other abnormalities of gait and mobility: Secondary | ICD-10-CM

## 2024-07-29 DIAGNOSIS — M6281 Muscle weakness (generalized): Secondary | ICD-10-CM | POA: Diagnosis not present

## 2024-07-29 DIAGNOSIS — R262 Difficulty in walking, not elsewhere classified: Secondary | ICD-10-CM

## 2024-07-29 NOTE — Therapy (Signed)
 OUTPATIENT PHYSICAL THERAPY NEURO TREATMENT  Patient Name: Anna Tapia MRN: [id number] DOB:1991/01/22, 33 y.o., female Today's Date: 07/29/2024   PCP: Bernardo Fend, DO  REFERRING PROVIDER: Bernardo Fend, DO   END OF SESSION:  PT End of Session - 07/29/24 1521     Visit Number 17    Number of Visits 24    Date for Recertification  08/19/24    Progress Note Due on Visit 20    PT Start Time 1400    PT Stop Time 1445    PT Time Calculation (min) 45 min    Equipment Utilized During Treatment Gait belt    Activity Tolerance Patient tolerated treatment well;No increased pain    Behavior During Therapy WFL for tasks assessed/performed                Past Medical History:  Diagnosis Date   Arteriovenous malformation of brain    a. s/p remote coiling.   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   Congenital CHF (congestive heart failure) (HCC)    a. 03/2023 Echo: EF 60-65%, no rwma, nl RV fxn, RVSP 29.42mmHg. Mild MR. Mild Ao sclerosis w/o stenosis. Ao root 38mm.   COVID 2022   Hemorrhagic stroke (HCC) 2023   a. L-sided midbrain, thalamic ICH in setting of PCA/ACA aneurysm s/p embolization.   History of broken collarbone 2020   Hydrocephalus (HCC)    a. s/p VP shunt in childhood.   Precordial chest pain    a. 03/2023 MV: No ischemia or infarct.  EF greater than 65%.  No significant coronary calcification.   Sepsis secondary to UTI (HCC) 2021   Past Surgical History:  Procedure Laterality Date   VENTRICULOPERITONEAL SHUNT     Patient Active Problem List   Diagnosis Date Noted   History of CVA with residual deficit 11/25/2023   CHF (congestive heart failure) (HCC) 03/25/2023   Left-sided nontraumatic intracerebral hemorrhage (HCC) 08/01/2022   AVM (arteriovenous malformation) brain 07/24/2022   Asthma 07/24/2022   Major depressive disorder, recurrent episode, moderate (HCC) 02/07/2022   Sepsis secondary to UTI  (HCC) 05/01/2020   VP (ventriculoperitoneal) shunt status 05/01/2020   Right ovarian cyst 05/01/2020   Elevated LFTs 05/01/2020    ONSET DATE: September  2023  REFERRING DIAG:  Diagnosis  I69.30 (ICD-10-CM) - History of CVA with residual deficit    THERAPY DIAG:  Muscle weakness (generalized)  Difficulty in walking, not elsewhere classified  Unsteadiness on feet  Other abnormalities of gait and mobility  Rationale for Evaluation and Treatment: Rehabilitation  SUBJECTIVE:  SUBJECTIVE STATEMENT:  Pt reports doing well today. Pt denies any recent falls/stumbles since prior session. I eager to go to stroke support group.   No medical updates since last PT session.    Pt accompanied by: self  PERTINENT HISTORY:  From recent MD visit:  [name] presents to follow up on chronic medical conditions. Having some knee pain today, had been doing well with PT but her insurance is no longer paying for this until the new year.    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  Also on Lyrica 50 mg BID  -Difficulty using using right hand and right side of face - completed home PT/OT but interested in continuing with therapy outside the home.  Hx of CHF as a newborn: -Had been following with Cardiology at [facility], last seen in 2018 -Last echo 9/23 EF 58% -Denies chest pain, palpitations, shortness of breath or lower extremity swelling  PAIN:  Are you having  pain? Yes: NPRS scale: 4/10 Pain location: R shoulder  Pain description: dull pain  Aggravating factors: moving arm  Relieving factors: n/a   PRECAUTIONS: Fall  RED FLAGS: None   WEIGHT BEARING RESTRICTIONS: No  FALLS: Has patient fallen in last 6 months? No  LIVING ENVIRONMENT: Lives with: lives with their family Lives in: House/apartment Stairs: Yes: External: 2 steps; on right going up and on left going up Has following equipment at home: Single point cane  PLOF: Independent, Independent with basic ADLs, and prior to CVA   PATIENT GOALS: improve walking. Get around better   OBJECTIVE:  Note: Objective measures were completed at Evaluation unless otherwise noted.  DIAGNOSTIC FINDINGS:   CT 2023 IMPRESSION: 1. 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 Again, prominent arterial contribution to the AVM is seen from the left ACA and posterior circulation, with primary venous drainage into the deep venous system. Associated 7 mm aneurysm along the right anterolateral aspect of the AVM as above. 2. Diffuse tortuosity and ectasia elsewhere about the major arterial vasculature of the head and neck. No large vessel occlusion or hemodynamically significant stenosis. No other acute vascular abnormality.    COGNITION: Overall cognitive status: Within functional limits for tasks assessed   SENSATION: Light touch: Impaired  tight feeling on the R foot due to swelling   COORDINATION: Spastic hemiplegia on the R side.   EDEMA:  Circumferential:  distal to knee in sitting R: 41.6CM L 39cm  MUSCLE TONE: RLE: Mild noted increased tone with gait and mobility.   DTRs:  Patella 1 = Trace   POSTURE: rounded shoulders, forward head, and left pelvic obliquity  LOWER EXTREMITY ROM:     Active  Right Eval Left Eval  Hip flexion [facility]  Hip extension [facility]  Hip abduction    Hip adduction    Hip internal rotation     Hip external rotation    Knee flexion 110 WFL  Knee extension [facility]  Ankle dorsiflexion 8 15  Ankle plantarflexion [facility]  Ankle inversion    Ankle eversion     (Blank rows =  not tested)  LOWER EXTREMITY MMT:    03/25/24 MMT Right Eval Left Eval  Hip flexion 4+ 4+  Hip extension    Hip abduction 5 5  Hip adduction 5 5  Hip internal rotation    Hip external rotation    Knee flexion 4+ 5  Knee extension 5 5  Ankle dorsiflexion 4 5  Ankle plantarflexion    Ankle inversion    Ankle eversion    (Blank rows = not tested)  BED MOBILITY:  Sit to supine SBA Supine to sit SBA Rolling to Right SBA Rolling to Left SBA  TRANSFERS: Assistive device utilized: None  Sit to stand: SBA Stand to sit: SBA Chair to chair: SBA Floor: need to assess  CURB:  Level of Assistance: Modified independence and SBA Assistive device utilized: rail  Curb Comments: step to descent   STAIRS: Level of Assistance: Modified independence Stair Negotiation Technique: Step to Pattern with descent and Single Rail on Left Number of Stairs: 4  Height of Stairs: 6  Comments: mild posterior bias on descent. And step to pattern   GAIT: Gait pattern: step through pattern, decreased stance time- Right, Right hip hike, trunk rotated posterior- Left, wide BOS, and poor foot clearance- Right Distance walked: 60 Assistive device utilized: None Level of assistance: Modified independence and SBA Comments: see description  FUNCTIONAL TESTS:  See Goal assessment.   PATIENT SURVEYS:  ABC scale 48.75  See goals assessment                                                                                                                              TREATMENT DATE: 07/29/24 Self care: informed of BP values and importance of communicating values to physician for improved efficacy of BP treatments  BP: 132/98  mmHg with HR 90  BP: 130/94  mmHg with HR 83   After nustep  BP: 149/95  mmHg   TA- To  improve functional movements patterns for everyday tasks   Nustep rolling hills mode level 2-6 x 6 min for B UE and LE reciprocal movement training  NMR: To facilitate reeducation of movement, balance, posture, coordination, and/or proprioception/kinesthetic sense.  Forward stepping over hurdles and 1/2 bolsters x 4 laps   Forward and retro stepping over 1/2 bolsters x 4 laps   Forward step on and off then retro step on / off airex pad 2 x 10 reps - cues for proper sequence   Lateral airex step on and off 2 x 5 to ea side   BOSU stance 3 x 1 min on hard side   Unless otherwise stated, CGA was provided and gait belt donned in order to ensure pt safety   PATIENT EDUCATION: Education details: POC. Benefits of BWSTT to improve step symmetry of increased repetitions/volume  Person educated: Patient Education method: Explanation and Demonstration Education comprehension: verbalized understanding  HOME EXERCISE PROGRAM: Access Code: [email or web address] URL: [email or web address] Date: 02/19/2024 Prepared by: Massie Dollar  Exercises - Seated March with Ankle Weights at Foot  - 1 x daily - 7 x weekly - 3 sets - 10 reps - Side Stepping with Resistance at Feet  - 1 x daily - 7 x weekly - 3 sets - 10 reps - Tandem Stance  - 1 x daily - 5 x weekly - 3 sets - 4 reps - 20 hold - Staggered Sit-to-Stand  - 1 x daily - 5 x weekly - 2 sets - 5 reps - Sit to Stand with Arms Crossed  - 1 x daily - 5 x weekly - 3 sets - 10 reps - Seated Knee Extension with Resistance  - 1 x daily - 7 x weekly - 3 sets - 10 reps - Seated Hip Abduction with Resistance  - 1 x daily - 7 x weekly - 3 sets - 10 reps - Standing Single Leg Stance with Counter Support  - 1 x daily - 7 x weekly - 3 sets - 10 reps  GOALS: Goals reviewed with patient? Yes  SHORT TERM GOALS: Target date: 07/01/2024  Patient will be independent in home exercise program to improve strength/mobility for better functional independence with  ADLs. Baseline: to be given on visit 2  5/:22: HEP provided and reviewed since 4/17 7/24: reports that she has not complete for ~ 1 MONTH - reprinted on this day  .    Goal status: IN PROGRESS  LONG TERM GOALS: Target date: 08/19/2024  Patient will increase ABC score to equal to or greater than   10%  to demonstrate statistically significant improvement in mobility and quality of life.  Baseline: 48.75 5/22: 62.5% 7/24: 56.25%  Goal status: IN PROGRESS  2.  Patient (> 71 years old) will complete five times sit to stand test in < 11 seconds indicating an increased LE strength and improved balance. Baseline: 12.41sec 5/22: 11.91sec without UE support average of 2 trials. 7/24: 14.34 sec  Goal status: IN PROGRESS  3.  Patient will increase 6 min walk test to >1249ft to demonstrate decreased fall risk during functional activities with community mobility/  Baseline: 932ft without AD. No rest break needed.  5/22: 949ft without AD no rest break required.  7/24: 1068 no AD> no rest breaks  Goal status: IN PROGRESS  4.  Patient will increase 10 meter walk test to >1.63m/s as to improve gait speed for better community ambulation and to reduce fall risk. Baseline: 0.33m/s 5/22:Average Normal speed: 0.87 m/s; Average Fast speed: 1.03 m/s 7/24: 0.80m/s no AD  Goal status: IN PROGRESS  5.  Patient will reduce timed up and go to <10 seconds to reduce fall risk and demonstrate improved transfer/gait ability. Baseline: 10.94 5/22: 9.08 sec without UE support  7/24: 12.86 sec without AD   Goal status: IN PROGRESS  6.  Patient will increase dynamic gait index score to >24 as to demonstrate reduced fall risk and improved dynamic gait balance for better safety with community/home ambulation.   Baseline: 20; 06/03/2024= 20 Goal status: IN PROGRESS   ASSESSMENT:  CLINICAL IMPRESSION:  Patient continues to progress with lower extremity strength training this date.  Pt blood pressure checked ad  although consistent pt did have elevated diastolic BP. Instructed pt to contact MD regarding this elevation as she reports she did have a recent medication change and it has been running a little high since. Also instructed that this can be a result of tone. Today focussed on higher level balance and coordination activities and  pt overall responded well to interventions demonstrating improved confidence and capabilities with harder tasks with practice. Pt will continue to benefit from skilled physical therapy intervention to address impairments, improve QOL, and attain therapy goals.   OBJECTIVE IMPAIRMENTS: Abnormal gait, cardiopulmonary status limiting activity, decreased activity tolerance, decreased balance, decreased coordination, decreased endurance, decreased knowledge of use of DME, decreased mobility, difficulty walking, decreased ROM, decreased strength, hypomobility, increased edema, increased fascial restrictions, increased muscle spasms, impaired flexibility, impaired sensation, impaired tone, impaired UE functional use, improper body mechanics, postural dysfunction, and pain.   ACTIVITY LIMITATIONS: carrying, lifting, bending, squatting, stairs, transfers, bed mobility, dressing, and locomotion level  PARTICIPATION LIMITATIONS: cleaning, laundry, interpersonal relationship, driving, shopping, community activity, occupation, and yard work  PERSONAL FACTORS: 1-2 comorbidities: hs of CHF and CVA are also affecting patient's functional outcome.   REHAB POTENTIAL: Good  CLINICAL DECISION MAKING: Evolving/moderate complexity  EVALUATION COMPLEXITY: Moderate  PLAN:  PT FREQUENCY: 1-2x/week  PT DURATION: 12 weeks  PLANNED INTERVENTIONS: 97110-Therapeutic exercises, 97530- Therapeutic activity, 97112- Neuromuscular re-education, 97535- Self Care, 02859- Manual therapy, [phone]- Gait training, [phone]- Orthotic Fit/training, [phone]- Splinting, [phone]- Electrical stimulation (unattended), [phone]-  Electrical stimulation (manual), Patient/Family education, Balance training, Stair training, Taping, Dry Needling, Joint mobilization, Joint manipulation, Spinal manipulation, Spinal mobilization, Vestibular training, Visual/preceptual remediation/compensation, DME instructions, Cryotherapy, and Moist heat  PLAN FOR NEXT SESSION:   Continue Dynamic balance training.  R ankle and gluteal strengthening  BWSTT?   Lonni KATHEE Gainer PT ,DPT Physical Therapist- [facility]   07/29/24, 3:22 PM

## 2024-07-30 ENCOUNTER — Ambulatory Visit: Payer: Self-pay | Admitting: Internal Medicine

## 2024-07-30 ENCOUNTER — Ambulatory Visit: Payer: Self-pay

## 2024-07-30 NOTE — Telephone Encounter (Signed)
 PCP advised pt on MyChart to take 2 of the losartan  daily over the weekend and schedule appt next week. This RN was unable to find an opening until Oct 7, scheduled. Please contact patient, thank you.   FYI Only or Action Required?: Action required by provider: request for appointment.  Patient was last seen in primary care on 07/19/2024 by Bernardo Fend, DO.  Called Nurse Triage reporting Hypertension.  Symptoms began yesterday.  Interventions attempted: Rest, hydration, or home remedies.  Symptoms are: gradually improving.  Triage Disposition: Discuss With PCP and Callback by Nurse Today (overriding See PCP Within 2 Weeks)  Patient/caregiver understands and will follow disposition?:  Reason for Disposition  [1] Systolic BP >= 130 OR Diastolic >= 80 AND [2] taking BP medications  Answer Assessment - Initial Assessment Questions Patient switched from lisinopril  to losartan  one week ago d/t cough. States BP today was 155/94.  1. BLOOD PRESSURE:      155/94 at 1030 today  3. HOW: How did you take your blood pressure? (e.g., automatic home BP monitor, visiting nurse)     Machine  4. HISTORY: Do you have a history of high blood pressure?     Yes  5. MEDICINES: Are you taking any medicines for blood pressure? Have you missed any doses recently?     Losartan , has not missed any doses  6. OTHER SYMPTOMS: Do you have any symptoms? (e.g., blurred vision, chest pain, difficulty breathing, headache, weakness)     3/10 headache and minor dizziness. Last night had severe headache.  7. PREGNANCY: Is there any chance you are pregnant? When was your last menstrual period?     Denies  Protocols used: Blood Pressure - High-A-AH Copied from CRM [id number]. Topic: Clinical - Red Word Triage >> Jul 30, 2024  3:08 PM Rachelle R wrote: [facility] that prompted transfer to Nurse Triage: Patients BP is high, checked an hour ago and its 155/94. States she has had a headache the past  two days.

## 2024-08-02 ENCOUNTER — Encounter: Payer: Self-pay | Admitting: Internal Medicine

## 2024-08-02 ENCOUNTER — Ambulatory Visit (INDEPENDENT_AMBULATORY_CARE_PROVIDER_SITE_OTHER): Admitting: Internal Medicine

## 2024-08-02 ENCOUNTER — Other Ambulatory Visit: Payer: Self-pay

## 2024-08-02 VITALS — BP 138/86 | HR 100 | Temp 98.5°F | Resp 16 | Ht 60.0 in | Wt 175.6 lb

## 2024-08-02 DIAGNOSIS — I10 Essential (primary) hypertension: Secondary | ICD-10-CM | POA: Diagnosis not present

## 2024-08-02 MED ORDER — OLMESARTAN MEDOXOMIL 20 MG PO TABS: 20.0000 mg | ORAL_TABLET | Freq: Every day | ORAL | 0 refills | Status: DC

## 2024-08-02 NOTE — Progress Notes (Signed)
 Established Patient Office Visit  Subjective    Patient ID: Anna Tapia, female    DOB: [birth year]  Age: 33 y.o. MRN: [id number]  CC:  Chief Complaint  Patient presents with   Hypertension    HPI Anna Tapia presents for recheck of blood pressure.   Discussed the use of AI scribe software for clinical note transcription with the patient, who gave verbal consent to proceed.  History of Present Illness Anna Tapia Anna Tapia is a 33 year old female with hypertension who presents with uncontrolled blood pressure and medication side effects.  Her blood pressure remains elevated, with systolic readings between 140 to 150 mmHg, despite switching from lisinopril  to losartan  due to a cough. She has been on losartan  50 mg daily for two days but had been on the 25 mg. She monitors her blood pressure twice daily.  She experiences decreased appetite and a metallic or bitter aftertaste from losartan , which concerns her. She seeks a medication without these side effects.  She uses Aleve  as needed for knee pain, with a previous x-ray showing mild arthritis. Her mother also has arthritis. She denies dizziness, lightheadedness, or visual disturbances.    Outpatient Encounter Medications as of 08/02/2024  Medication Sig   albuterol  (VENTOLIN  HFA) 108 (90 Base) MCG/ACT inhaler Inhale 2 puffs into the lungs every 6 (six) hours as needed for wheezing or shortness of breath.   baclofen  (LIORESAL ) 10 MG tablet TAKE ONE-HALF TABLET BY MOUTH IN THE MORNING, ONE TABLET AT LUNCH, AND 2 TABLETS AT NIGHT   DULoxetine  (CYMBALTA ) 20 MG capsule Take 2 capsules by mouth once daily   fluticasone -salmeterol (ADVAIR) 100-50 MCG/ACT AEPB Inhale 1 puff into the lungs 2 (two) times daily.   losartan  (COZAAR ) 25 MG tablet Take 1 tablet (25 mg total) by mouth daily.   pregabalin (LYRICA) 50 MG capsule Take 50 mg by mouth 2 (two) times daily.   Blood Pressure Monitoring (ADULT BLOOD PRESSURE CUFF  LG) KIT 1 each by Does not apply route daily.   famotidine  (PEPCID ) 20 MG tablet Take 1 tablet (20 mg total) by mouth 2 (two) times daily. (Patient not taking: Reported on 07/02/2024)   No facility-administered encounter medications on file as of 08/02/2024.    Past Medical History:  Diagnosis Date   Arteriovenous malformation of brain    a. s/p remote coiling.   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   Congenital CHF (congestive heart failure) (HCC)    a. 03/2023 Echo: EF 60-65%, no rwma, nl RV fxn, RVSP 29.76mmHg. Mild MR. Mild Ao sclerosis w/o stenosis. Ao root 38mm.   COVID 2022   Hemorrhagic stroke (HCC) 2023   a. L-sided midbrain, thalamic ICH in setting of PCA/ACA aneurysm s/p embolization.   History of broken collarbone 2020   Hydrocephalus (HCC)    a. s/p VP shunt in childhood.   Precordial chest pain    a. 03/2023 MV: No ischemia or infarct.  EF greater than 65%.  No significant coronary calcification.   Sepsis secondary to UTI [facility]) 2021    Past Surgical History:  Procedure Laterality Date   VENTRICULOPERITONEAL SHUNT      Family History  Problem Relation Age of Onset   Seizures Mother    Hypertension Mother    Diabetes Mother    Cancer Maternal Aunt    Cancer Maternal Uncle     Social History   Socioeconomic History  Marital status: Single    Spouse name: Not on file   Number of children: Not on file   Years of education: Not on file   Highest education level: GED or equivalent  Occupational History   Not on file  Tobacco Use   Smoking status: Former    Current packs/day: 0.00    Types: E-cigarettes, Cigarettes    Start date: 2007    Quit date: 2021    Years since quitting: 4.7    Passive exposure: Past   Smokeless tobacco: Never  Vaping Use   Vaping status: Former  Substance and Sexual Activity   Alcohol use: Not Currently   Drug use: Never   Sexual activity: Not Currently    Partners: Male    Birth  control/protection: Abstinence  Other Topics Concern   Not on file  Social History Narrative   Not on file   Social Drivers of Health   Financial Resource Strain: Medium Risk (06/18/2024)   Overall Financial Resource Strain (CARDIA)    Difficulty of Paying Living Expenses: Somewhat hard  Food Insecurity: Food Insecurity Present (07/02/2024)   Hunger Vital Sign    Worried About Running Out of Food in the Last Year: Sometimes true    Ran Out of Food in the Last Year: Sometimes true  Transportation Needs: No Transportation Needs (07/02/2024)   PRAPARE - Administrator, Civil Service (Medical): No    Lack of Transportation (Non-Medical): No  Physical Activity: Insufficiently Active (06/18/2024)   Exercise Vital Sign    Days of Exercise per Week: 2 days    Minutes of Exercise per Session: 20 min  Stress: Stress Concern Present (06/18/2024)   [employer] of Occupational Health - Occupational Stress Questionnaire    Feeling of Stress: To some extent  Social Connections: Socially Isolated (06/18/2024)   Social Connection and Isolation Panel    Frequency of Communication with Friends and Family: Three times a week    Frequency of Social Gatherings with Friends and Family: Once a week    Attends Religious Services: Never    Database administrator or Organizations: No    Attends Engineer, structural: Not on file    Marital Status: Never married  Intimate Partner Violence: Not At Risk (07/02/2024)   Humiliation, Afraid, Rape, and Kick questionnaire    Fear of Current or Ex-Partner: No    Emotionally Abused: No    Physically Abused: No    Sexually Abused: No    Review of Systems  Respiratory:  Negative for shortness of breath.   Cardiovascular:  Negative for chest pain.  Neurological:  Positive for headaches.        Objective    BP (!) 148/86 (Cuff Size: Normal)   Pulse 100   Temp 98.5 F (36.9 C) (Oral)   Resp 16   Ht 5' (1.524 m)   Wt 175 lb 9.6 oz  (79.7 kg)   SpO2 96%   BMI 34.29 kg/m   Vitals:   08/02/24 1104 08/02/24 1139  BP: (!) 148/86 138/86    Physical Exam Constitutional:      Appearance: Normal appearance.  HENT:     Head: Normocephalic and atraumatic.  Eyes:     Conjunctiva/sclera: Conjunctivae normal.  Cardiovascular:     Rate and Rhythm: Normal rate and regular rhythm.  Pulmonary:     Effort: Pulmonary effort is normal.     Breath sounds: Normal breath sounds.  Musculoskeletal:  Right lower leg: No edema.     Left lower leg: No edema.  Skin:    General: Skin is warm and dry.  Neurological:     Mental Status: She is alert. Mental status is at baseline.  Psychiatric:        Mood and Affect: Mood normal.        Behavior: Behavior normal.         Assessment & Plan:   Assessment & Plan Hypertension Hypertension not controlled on losartan  50 mg, causing metallic taste. - Prescribe olmesartan 20 mg once daily. - Instruct to monitor blood pressure daily for one week. - If blood pressure remains above 140/90 mmHg, increase olmesartan to 40 mg once daily. - Schedule follow-up in two weeks. - Send prescription for olmesartan to [facility].   - olmesartan (BENICAR) 20 MG tablet; Take 1 tablet (20 mg total) by mouth daily.  Dispense: 30 tablet; Refill: 0   Return in about 2 weeks (around 08/16/2024).   Sharyle Fischer, DO

## 2024-08-03 ENCOUNTER — Encounter: Payer: Self-pay | Admitting: Internal Medicine

## 2024-08-05 ENCOUNTER — Ambulatory Visit

## 2024-08-05 ENCOUNTER — Ambulatory Visit: Admitting: Physical Therapy

## 2024-08-10 ENCOUNTER — Ambulatory Visit: Admitting: Internal Medicine

## 2024-08-11 ENCOUNTER — Other Ambulatory Visit: Payer: Self-pay | Admitting: *Deleted

## 2024-08-11 NOTE — Patient Instructions (Signed)
 Visit Information  Thank you for taking time to visit with me today. Please don't hesitate to contact me if I can be of assistance to you before our next scheduled appointment.  Your next care management appointment is by telephone on 08/25/24 at 9:30am    Please call the care guide team at [phone] if you need to cancel, schedule, or reschedule an appointment.   Please call the Suicide and Crisis Lifeline: 988 call the [place]  National Suicide Prevention Lifeline: 970-540-2088 or TTY: 720 886 3165 TTY 7036512607) to talk to a trained counselor call 1-800-273-TALK (toll free, 24 hour hotline) call 911 if you are experiencing a Mental Health or Behavioral Health Crisis or need someone to talk to.  Cielle Aguila, LCSW [place]  [facility], [facility] Licensed Clinical Social Worker  Direct Dial: [phone]

## 2024-08-11 NOTE — Patient Outreach (Signed)
 Complex Care Management   Visit Note  08/11/2024  Name:  Anna Tapia MRN: [id number] DOB: 04/18/1991  Situation: Referral received for Complex Care Management related to Mental/Behavioral Health diagnosis depression I obtained verbal consent from Patient.  Visit completed with Patient  on the phone    Background:   Past Medical History:  Diagnosis Date   Arteriovenous malformation of brain    a. s/p remote coiling.   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   Congenital CHF (congestive heart failure) (HCC)    a. 03/2023 Echo: EF 60-65%, no rwma, nl RV fxn, RVSP 29.67mmHg. Mild MR. Mild Ao sclerosis w/o stenosis. Ao root 38mm.   COVID 2022   Hemorrhagic stroke (HCC) 2023   a. L-sided midbrain, thalamic ICH in setting of PCA/ACA aneurysm s/p embolization.   History of broken collarbone 2020   Hydrocephalus (HCC)    a. s/p VP shunt in childhood.   Precordial chest pain    a. 03/2023 MV: No ischemia or infarct.  EF greater than 65%.  No significant coronary calcification.   Sepsis secondary to UTI [facility]) 2021    Assessment: Patient Reported Symptoms:  Cognitive Cognitive Status: Struggling with memory recall, Alert and oriented to person, place, and time, Insightful and able to interpret abstract concepts Cognitive/Intellectual Conditions Management [RPT]: Other Brain Injury: stroke 2 years ago   Health Maintenance Behaviors: Annual physical exam, Healthy diet Healing Pattern: Slow Health Facilitated by: Healthy diet  Neurological Neurological Review of Symptoms: Headaches Neurological Management Strategies: Medication therapy  HEENT HEENT Symptoms Reported: No symptoms reported      Cardiovascular Cardiovascular Symptoms Reported: No symptoms reported    Respiratory Respiratory Symptoms Reported: No symptoms reported    Endocrine Endocrine Symptoms Reported: No symptoms reported Is patient diabetic?: No    Gastrointestinal  Gastrointestinal Symptoms Reported: No symptoms reported      Genitourinary Genitourinary Symptoms Reported: No symptoms reported    Integumentary Integumentary Symptoms Reported: No symptoms reported    Musculoskeletal Musculoskelatal Symptoms Reviewed: No symptoms reported Additional Musculoskeletal Details: continued pain in knees-patient reports having osteo-arthritis Musculoskeletal Management Strategies: Adequate rest, Medication therapy      Psychosocial Psychosocial Symptoms Reported: Depression - if selected complete PHQ 2-9 Additional Psychological Details: I am having better days Behavioral Health Comment: Active with strok esupport group-1x per mont Major Change/Loss/Stressor/Fears (CP): Medical condition, self Behaviors When Feeling Stressed/Fearful: family remains support, support group Techniques to [employer] with Loss/Stress/Change: Medication, Support group Quality of Family Relationships: supportive, involved, helpful Do you feel physically threatened by others?: No    08/11/2024    PHQ2-9 Depression Screening   Little interest or pleasure in doing things    Feeling down, depressed, or hopeless    PHQ-2 - Total Score    Trouble falling or staying asleep, or sleeping too much    Feeling tired or having little energy    Poor appetite or overeating     Feeling bad about yourself - or that you are a failure or have let yourself or your family down    Trouble concentrating on things, such as reading the newspaper or watching television    Moving or speaking so slowly that other people could have noticed.  Or the opposite - being so fidgety or restless that you have been moving around a lot more than usual    Thoughts that you would be better off dead, or hurting yourself in some  way    PHQ2-9 Total Score    If you checked off any problems, how difficult have these problems made it for you to do your work, take care of things at home, or get along with other people     Depression Interventions/Treatment      There were no vitals filed for this visit.  Medications Reviewed Today     Reviewed by Ermalinda Lenn HERO, LCSW (Social Worker) on 08/11/24 at 1634  Med List Status: <None>   Medication Order Taking? Sig Documenting Provider Last Dose Status Informant  albuterol  (VENTOLIN  HFA) 108 (90 Base) MCG/ACT inhaler [id number] Yes Inhale 2 puffs into the lungs every 6 (six) hours as needed for wheezing or shortness of breath. Bernardo Fend, DO  Active   baclofen  (LIORESAL ) 10 MG tablet [id number] Yes TAKE ONE-HALF TABLET BY MOUTH IN THE MORNING, ONE TABLET AT LUNCH, AND 2 TABLETS AT ADA Bernardo Fend, DO  Active   DULoxetine  (CYMBALTA ) 20 MG capsule [id number] Yes Take 2 capsules by mouth once daily Bernardo Fend, DO  Active   famotidine  (PEPCID ) 20 MG tablet [id number]  Take 1 tablet (20 mg total) by mouth 2 (two) times daily.  Patient not taking: Reported on 08/11/2024   Viviann Pastor, MD  Active Pharmacy Records, Multiple Informants           Med Note GROVER BURNARD GORMAN Stevan Jul 24, 2022  7:57 PM) 07/03/2022 20 MG TABS (disp 60, 30d supply)   fluticasone -salmeterol (ADVAIR) 100-50 MCG/ACT AEPB [id number] Yes Inhale 1 puff into the lungs 2 (two) times daily. Bernardo Fend, DO  Active   olmesartan (BENICAR) 20 MG tablet [id number] Yes Take 1 tablet (20 mg total) by mouth daily. Bernardo Fend, DO  Active   pregabalin (LYRICA) 50 MG capsule [id number] Yes Take 50 mg by mouth 2 (two) times daily. [provider]  Active             Recommendation:   PCP Follow-up Follow up with [facility] to schedule initial appointment Continue with support group monthly Follow Up Plan:   Telephone follow up appointment date/time:  08/25/24 9:30am  Karynn Deblasi Ermalinda HUGHS [place]  [facility], [facility] Licensed Clinical Social Worker  Direct Dial: [phone]

## 2024-08-12 ENCOUNTER — Ambulatory Visit

## 2024-08-12 ENCOUNTER — Ambulatory Visit: Attending: Internal Medicine | Admitting: Physical Therapy

## 2024-08-12 ENCOUNTER — Ambulatory Visit: Admitting: Physical Therapy

## 2024-08-12 ENCOUNTER — Telehealth: Payer: Self-pay | Admitting: Physical Therapy

## 2024-08-12 NOTE — Telephone Encounter (Signed)
 Called pt regarding missed appointment. Informed her she would need to call front desk to schedule another and she verbalized understanding.   Lonni Gainer PT, DPT

## 2024-08-13 ENCOUNTER — Other Ambulatory Visit: Payer: Self-pay

## 2024-08-13 ENCOUNTER — Telehealth: Payer: Self-pay | Admitting: *Deleted

## 2024-08-13 NOTE — Patient Outreach (Signed)
 Phone call from patient confirming that she has made her initial appointment with [facility] is scheduled for Tuesday, 08/17/24 at 10am.   Lenn Mean, LCSW [place]  [facility], [facility] Licensed Clinical Social Worker  Direct Dial: [phone]

## 2024-08-13 NOTE — Patient Outreach (Signed)
 Complex Care Management   Visit Note  08/13/2024  Name:  Anna Tapia MRN: [id number] DOB: 07-Jan-1991  Situation: Referral received for Complex Care Management related to Heart Failure and HTN I obtained verbal consent from Patient.  Visit completed with Patient  on the phone  Background:   Past Medical History:  Diagnosis Date   Arteriovenous malformation of brain    a. s/p remote coiling.   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   Congenital CHF (congestive heart failure) (HCC)    a. 03/2023 Echo: EF 60-65%, no rwma, nl RV fxn, RVSP 29.20mmHg. Mild MR. Mild Ao sclerosis w/o stenosis. Ao root 38mm.   COVID 2022   Hemorrhagic stroke (HCC) 2023   a. L-sided midbrain, thalamic ICH in setting of PCA/ACA aneurysm s/p embolization.   History of broken collarbone 2020   Hydrocephalus (HCC)    a. s/p VP shunt in childhood.   Precordial chest pain    a. 03/2023 MV: No ischemia or infarct.  EF greater than 65%.  No significant coronary calcification.   Sepsis secondary to UTI [facility]) 2021    Assessment: Patient Reported Symptoms:  Cognitive Cognitive Status: Struggling with memory recall, Alert and oriented to person, place, and time, Insightful and able to interpret abstract concepts, Normal speech and language skills (short term memory issues - uses phone reminder) Cognitive/Intellectual Conditions Management [RPT]: None reported or documented in medical history or problem list   Health Maintenance Behaviors: Annual physical exam, Healthy diet, Exercise, Sleep adequate Health Facilitated by: Healthy diet  Neurological Neurological Review of Symptoms: No symptoms reported Neurological Management Strategies: Medication therapy  HEENT HEENT Symptoms Reported: No symptoms reported HEENT Management Strategies: Routine screening    Cardiovascular Cardiovascular Symptoms Reported: No symptoms reported Does patient have uncontrolled Hypertension?:  No Cardiovascular Management Strategies: Medication therapy, Routine screening Weight: 172 lb (78 kg) Cardiovascular Comment: BP improved Olmesartan 40 mg, aware to notify provider of readings over 130/90, no HF sx, dizziness improved with BP control, states did not receive mailed info - will resend  Respiratory Respiratory Symptoms Reported: No symptoms reported Other Respiratory Symptoms: no longer coughing since stopping lisopril Respiratory Management Strategies: Routine screening  Endocrine Is patient diabetic?: No    Gastrointestinal Gastrointestinal Symptoms Reported: No symptoms reported Gastrointestinal Management Strategies: Diet modification Gastrointestinal Comment: good appetite    Genitourinary Genitourinary Symptoms Reported: No symptoms reported Additional Genitourinary Details: no gyn issues    Integumentary Integumentary Symptoms Reported: No symptoms reported    Musculoskeletal Musculoskelatal Symptoms Reviewed: Other Additional Musculoskeletal Details: knee pain improving, baclofen  for spasticity,  injections due it November Musculoskeletal Management Strategies: Medication therapy, Routine screening Falls in the past year?: No Number of falls in past year: 1 or less Was there an injury with Fall?: No Fall Risk Category Calculator: 0 Patient Fall Risk Level: Low Fall Risk Patient at Risk for Falls Due to: History of fall(s), Impaired balance/gait Fall risk Follow up: Falls evaluation completed, Falls prevention discussed  Psychosocial Psychosocial Symptoms Reported: No symptoms reported Additional Psychological Details: LCSW to call her back about [facility] appointment - no return call to patient from [facility] Comment: Stroke support group Major Change/Loss/Stressor/Fears (CP): Medical condition, self      08/13/2024    PHQ2-9 Depression Screening   Little interest or pleasure in doing things Not at all  Feeling down, depressed, or hopeless Several  days  PHQ-2 - Total Score 1  Trouble falling or staying asleep, or sleeping too much    Feeling tired or having little energy    Poor appetite or overeating     Feeling bad about yourself - or that you are a failure or have let yourself or your family down    Trouble concentrating on things, such as reading the newspaper or watching television    Moving or speaking so slowly that other people could have noticed.  Or the opposite - being so fidgety or restless that you have been moving around a lot more than usual    Thoughts that you would be better off dead, or hurting yourself in some way    PHQ2-9 Total Score    If you checked off any problems, how difficult have these problems made it for you to do your work, take care of things at home, or get along with other people    Depression Interventions/Treatment      Vitals:   08/13/24 1006  BP: 126/82    Medications Reviewed Today   Medications were not reviewed in this encounter     Recommendation:   PCP Follow-up Continue Current Plan of Care  Follow Up Plan:   Telephone follow-up in 1 month  [name], MSN, RN [facility]  [facility], [facility] RN Care Manager Direct Dial: [phone] Fax: [phone]

## 2024-08-13 NOTE — Patient Instructions (Signed)
 Visit Information  Anna Tapia was given information about Medicaid Managed Care team care coordination services as a part of their [facility] Medicaid benefit.   If you would like to schedule transportation through your [facility] plan, please call the following number at least 2 days in advance of your appointment: [phone].   You can also use the MTM portal or MTM mobile app to manage your rides. Reimbursement for transportation is available through [facility]! For the portal, please go to [email or web address]  Call the [facility] Crisis Line at [phone], at any time, 24 hours a day, 7 days a week. If you are in danger or need immediate medical attention call 911.  Please see education materials related to CHF, HTN, Falls, Stroke, Memory provided as Financial risk analyst.   The patient verbalized understanding of instructions, educational materials, and care plan provided today and agreed to receive a mailed copy of patient instructions, educational materials, and care plan.   Telephone follow up appointment with Managed Medicaid care management team member scheduled for:09/10/2024 at 10:00 am  [name], MSN, RN [facility]  [facility], [facility] RN Care Manager Direct Dial: [phone] Fax: [phone]   Warning Signs of a Stroke: What to Know A stroke happens when there's less blood flow to part of your brain. This keeps your brain from getting enough oxygen. It can lead to a brain injury. A stroke is an emergency and should be treated right away. You're more likely to get better after a stroke if you get help right away. It's very important to know the symptoms of a stroke. What types of strokes are there? There are 2 main types of stroke: Ischemic stroke. This is the most common type. It happens when a blood vessel that sends blood to the brain is blocked. Hemorrhagic stroke. This happens when there's bleeding in your brain. This may be  from a blood vessel leaking or bursting. A transient ischemic attack (TIA) causes the same symptoms as a stroke. But the symptoms go away quickly and don't cause lasting damage to your brain. TIAs need to be treated right away. Having a TIA is a sign that you are more at risk for having a stroke in the future. What are the warning signs of a stroke? The symptoms of a stroke may differ based on where it is in your brain. Symptoms often happen all of a sudden. BE FAST symptoms BE FAST is an easy way to remember the main warning signs: B - Balance. Feeling dizzy, sudden trouble walking, or loss of balance. E - Eyes. Trouble seeing or a change in how you see. F - Face. Sudden weakness or feeling numb in the face. The face or eyelid may droop on one side. A - Arms. Weakness or loss of feeling in an arm. This happens fast and often only on one side. ILENE - Speech. Sudden trouble speaking, slurred speech, or trouble understanding what people say. T - Time. Time to call 911. Write down what time symptoms started. You may be having a stroke even if you only have one BE FAST symptom. Other signs of a stroke Other signs of a stroke may be: A sudden, very bad headache with no known cause. Feeling like you may throw up. Throwing up. These symptoms may be an emergency. Call 911 right away. Do not wait to see if the symptoms will go away. Do not drive yourself to the hospital.  This information is not intended to replace advice  given to you by your health care provider. Make sure you discuss any questions you have with your health care provider. Document Revised: 02/12/2024 Document Reviewed: 02/12/2024 Elsevier Patient Education  2025 [employer].  Managing Your Hypertension Hypertension, also called high blood pressure, is when the force of the blood pressing against the walls of the arteries is too strong. Arteries are blood vessels that carry blood from your heart throughout your body.  Hypertension forces the heart to work harder to pump blood and may cause the arteries to become narrow or stiff. Understanding blood pressure readings A blood pressure reading includes a higher number over a lower number: The first, or top, number is called the systolic pressure. It is a measure of the pressure in your arteries as your heart beats. The second, or bottom number, is called the diastolic pressure. It is a measure of the pressure in your arteries as the heart relaxes. For most people, a normal blood pressure is below 120/80. Your personal target blood pressure may vary depending on your medical conditions, your age, and other factors. Blood pressure is classified into four stages. Based on your blood pressure reading, your health care provider may use the following stages to determine what type of treatment you need, if any. Systolic pressure and diastolic pressure are measured in a unit called millimeters of mercury (mmHg). Normal Systolic pressure: below 120. Diastolic pressure: below 80. Elevated Systolic pressure: 120-129. Diastolic pressure: below 80. Hypertension stage 1 Systolic pressure: 130-139. Diastolic pressure: 80-89. Hypertension stage 2 Systolic pressure: 140 or above. Diastolic pressure: 90 or above. How can this condition affect me? Managing your hypertension is very important. Over time, hypertension can damage the arteries and decrease blood flow to parts of the body, including the brain, heart, and kidneys. Having untreated or uncontrolled hypertension can lead to: A heart attack. A stroke. A weakened blood vessel (aneurysm). Heart failure. Kidney damage. Eye damage. Memory and concentration problems. Vascular dementia. What actions can I take to manage this condition? Hypertension can be managed by making lifestyle changes and possibly by taking medicines. Your health care provider will help you make a plan to bring your blood pressure within a normal  range. You may be referred for counseling on a healthy diet and physical activity. Nutrition  Eat a diet that is high in fiber and potassium, and low in salt (sodium), added sugar, and fat. An example eating plan is called the DASH diet. DASH stands for Dietary Approaches to Stop Hypertension. To eat this way: Eat plenty of fresh fruits and vegetables. Try to fill one-half of your plate at each meal with fruits and vegetables. Eat whole grains, such as whole-wheat pasta, brown rice, or whole-grain bread. Fill about one-fourth of your plate with whole grains. Eat low-fat dairy products. Avoid fatty cuts of meat, processed or cured meats, and poultry with skin. Fill about one-fourth of your plate with lean proteins such as fish, chicken without skin, beans, eggs, and tofu. Avoid pre-made and processed foods. These tend to be higher in sodium, added sugar, and fat. Reduce your daily sodium intake. Many people with hypertension should eat less than 1,500 mg of sodium a day. Lifestyle  Work with your health care provider to maintain a healthy body weight or to lose weight. Ask what an ideal weight is for you. Get at least 30 minutes of exercise that causes your heart to beat faster (aerobic exercise) most days of the week. Activities may include walking, swimming, or biking.  Include exercise to strengthen your muscles (resistance exercise), such as weight lifting, as part of your weekly exercise routine. Try to do these types of exercises for 30 minutes at least 3 days a week. Do not use any products that contain nicotine or tobacco. These products include cigarettes, chewing tobacco, and vaping devices, such as e-cigarettes. If you need help quitting, ask your health care provider. Control any long-term (chronic) conditions you have, such as high cholesterol or diabetes. Identify your sources of stress and find ways to manage stress. This may include meditation, deep breathing, or making time for fun  activities. Alcohol use Do not drink alcohol if: Your health care provider tells you not to drink. You are pregnant, may be pregnant, or are planning to become pregnant. If you drink alcohol: Limit how much you have to: 0-1 drink a day for women. 0-2 drinks a day for men. Know how much alcohol is in your drink. In the [place] one drink equals one 12 oz bottle of beer (355 mL), one 5 oz glass of wine (148 mL), or one 1 oz glass of hard liquor (44 mL). Medicines Your health care provider may prescribe medicine if lifestyle changes are not enough to get your blood pressure under control and if: Your systolic blood pressure is 130 or higher. Your diastolic blood pressure is 80 or higher. Take medicines only as told by your health care provider. Follow the directions carefully. Blood pressure medicines must be taken as told by your health care provider. The medicine does not work as well when you skip doses. Skipping doses also puts you at risk for problems. Monitoring Before you monitor your blood pressure: Do not smoke, drink caffeinated beverages, or exercise within 30 minutes before taking a measurement. Use the bathroom and empty your bladder (urinate). Sit quietly for at least 5 minutes before taking measurements. Monitor your blood pressure at home as told by your health care provider. To do this: Sit with your back straight and supported. Place your feet flat on the floor. Do not cross your legs. Support your arm on a flat surface, such as a table. Make sure your upper arm is at heart level. Each time you measure, take two or three readings one minute apart and record the results. You may also need to have your blood pressure checked regularly by your health care provider. General information Talk with your health care provider about your diet, exercise habits, and other lifestyle factors that may be contributing to hypertension. Review all the medicines you take with your health care  provider because there may be side effects or interactions. Keep all follow-up visits. Your health care provider can help you create and adjust your plan for managing your high blood pressure. Where to find more information National Heart, Lung, and Blood Institute: PopSteam.is American Heart Association: www.heart.org Contact a health care provider if: You think you are having a reaction to medicines you have taken. You have repeated (recurrent) headaches. You feel dizzy. You have swelling in your ankles. You have trouble with your vision. Get help right away if: You develop a severe headache or confusion. You have unusual weakness or numbness, or you feel faint. You have severe pain in your chest or abdomen. You vomit repeatedly. You have trouble breathing. These symptoms may be an emergency. Get help right away. Call 911. Do not wait to see if the symptoms will go away. Do not drive yourself to the hospital. Summary Hypertension is when the force  of blood pumping through your arteries is too strong. If this condition is not controlled, it may put you at risk for serious complications. Your personal target blood pressure may vary depending on your medical conditions, your age, and other factors. For most people, a normal blood pressure is less than 120/80. Hypertension is managed by lifestyle changes, medicines, or both. Lifestyle changes to help manage hypertension include losing weight, eating a healthy, low-sodium diet, exercising more, stopping smoking, and limiting alcohol. This information is not intended to replace advice given to you by your health care provider. Make sure you discuss any questions you have with your health care provider. Document Revised: 07/05/2021 Document Reviewed: 07/05/2021 Elsevier Patient Education  2024 Elsevier Inc.   Heart Failure Action Plan A heart failure action plan helps you know what to do when you have symptoms of heart failure. Your  action plan is a color-coded plan that lists the symptoms to watch for and indicates what actions to take. If you have symptoms in the green zone, you're doing well. If you have symptoms in the yellow zone, you're having problems. If you have symptoms in the red zone, you need medical care right away. Follow the plan that was created by you and your health care provider. Review your plan each time you visit your provider. Green zone These signs mean you're doing well and can continue what you're doing: You don't have new or worsening shortness of breath. You have very little swelling or no new swelling. Your weight is stable (no gain or loss). You have a normal activity level. You don't have chest pain or any other new symptoms. Yellow zone These signs and symptoms mean your condition may be getting worse and you should make some changes: You have trouble breathing when you're active. You have swelling in your feet or legs or have discomfort in your belly. You gain 2-3 lb (0.9-1.4 kg) in 24 hours, or 5 lb (2.3 kg) in a week. This amount may be more or less depending on your condition. You get tired easily. You have trouble sleeping. You have a dry cough. If you have any of these symptoms: Contact your provider within the next day. Your provider may adjust your medicines. Red zone These signs and symptoms mean you should get medical help right away: You have trouble breathing when resting or cannot lie flat and you need to raise your head to help you breathe. You have a dry cough that's getting worse. You have swelling or pain in your feet or legs or discomfort in your belly that's getting worse. You suddenly gain more than 2-3 lb (0.9-1.4 kg) in 24 hours, or more than 5 lb (2.3 kg) in a week. This amount may be more or less depending on your condition. You have trouble staying awake or you feel confused. You don't have an appetite. You have worsening sadness or depression. These  symptoms may be an emergency. Call 911 right away. Do not wait to see if the symptoms will go away. Do not drive yourself to the hospital. Follow these instructions at home: Take medicines only as told. Eat a heart-healthy diet. Work with a dietitian to create an eating plan that's best for you. Weigh yourself each day. Your target weight is __________ lb (__________ kg). Call your provider if you gain more than __________ lb (__________ kg) in 24 hours, or more than __________ lb (__________ kg) in a week. Health care provider name: _____________________________________________________ Health care provider phone  number: _____________________________________________________ Where to find more information American Heart Association: heart.org This information is not intended to replace advice given to you by your health care provider. Make sure you discuss any questions you have with your health care provider. Document Revised: 06/05/2023 Document Reviewed: 06/05/2023 Elsevier Patient Education  2024 [employer].   Fall Prevention in the Home, Adult Falls can cause injuries and can happen to people of all ages. There are many things you can do to make your home safer and to help prevent falls. What actions can I take to prevent falls? General information Use good lighting in all rooms. Make sure to: Replace any light bulbs that burn out. Turn on the lights in dark areas and use night-lights. Keep items that you use often in easy-to-reach places. Lower the shelves around your home if needed. Move furniture so that there are clear paths around it. Do not use throw rugs or other things on the floor that can make you trip. If any of your floors are uneven, fix them. Add color or contrast paint or tape to clearly mark and help you see: Grab bars or handrails. First and last steps of staircases. Where the edge of each step is. If you use a ladder or stepladder: Make sure that it is fully  opened. Do not climb a closed ladder. Make sure the sides of the ladder are locked in place. Have someone hold the ladder while you use it. Know where your pets are as you move through your home. What can I do in the bathroom?     Keep the floor dry. Clean up any water on the floor right away. Remove soap buildup in the bathtub or shower. Buildup makes bathtubs and showers slippery. Use non-skid mats or decals on the floor of the bathtub or shower. Attach bath mats securely with double-sided, non-slip rug tape. If you need to sit down in the shower, use a non-slip stool. Install grab bars by the toilet and in the bathtub and shower. Do not use towel bars as grab bars. What can I do in the bedroom? Make sure that you have a light by your bed that is easy to reach. Do not use any sheets or blankets on your bed that hang to the floor. Have a firm chair or bench with side arms that you can use for support when you get dressed. What can I do in the kitchen? Clean up any spills right away. If you need to reach something above you, use a step stool with a grab bar. Keep electrical cords out of the way. Do not use floor polish or wax that makes floors slippery. What can I do with my stairs? Do not leave anything on the stairs. Make sure that you have a light switch at the top and the bottom of the stairs. Make sure that there are handrails on both sides of the stairs. Fix handrails that are broken or loose. Install non-slip stair treads on all your stairs if they do not have carpet. Avoid having throw rugs at the top or bottom of the stairs. Choose a carpet that does not hide the edge of the steps on the stairs. Make sure that the carpet is firmly attached to the stairs. Fix carpet that is loose or worn. What can I do on the outside of my home? Use bright outdoor lighting. Fix the edges of walkways and driveways and fix any cracks. Clear paths of anything that can make you trip,  such as tools  or rocks. Add color or contrast paint or tape to clearly mark and help you see anything that might make you trip as you walk through a door, such as a raised step or threshold. Trim any bushes or trees on paths to your home. Check to see if handrails are loose or broken and that both sides of all steps have handrails. Install guardrails along the edges of any raised decks and porches. Have leaves, snow, or ice cleared regularly. Use sand, salt, or ice melter on paths if you live where there is ice and snow during the winter. Clean up any spills in your garage right away. This includes grease or oil spills. What other actions can I take? Review your medicines with your doctor. Some medicines can cause dizziness or changes in blood pressure, which increase your risk of falling. Wear shoes that: Have a low heel. Do not wear high heels. Have rubber bottoms and are closed at the toe. Feel good on your feet and fit well. Use tools that help you move around if needed. These include: Canes. Walkers. Scooters. Crutches. Ask your doctor what else you can do to help prevent falls. This may include seeing a physical therapist to learn to do exercises to move better and get stronger. Where to find more information Centers for Disease Control and Prevention, STEADI: TonerPromos.no [employer] on Aging: BaseRingTones.pl National Institute on Aging: BaseRingTones.pl Contact a doctor if: You are afraid of falling at home. You feel weak, drowsy, or dizzy at home. You fall at home. Get help right away if you: Lose consciousness or have trouble moving after a fall. Have a fall that causes a head injury. These symptoms may be an emergency. Get help right away. Call 911. Do not wait to see if the symptoms will go away. Do not drive yourself to the hospital. This information is not intended to replace advice given to you by your health care provider. Make sure you discuss any questions you have with your health care  provider. Document Revised: 06/24/2022 Document Reviewed: 06/24/2022 Elsevier Patient Education  2024 Elsevier Inc.   Management of Memory Problems  There are some general things you can do to help manage your memory problems.  Your memory may not in fact recover, but by using techniques and strategies you will be able to manage your memory difficulties better.  1)  Establish a routine. Try to establish and then stick to a regular routine.  By doing this, you will get used to what to expect and you will reduce the need to rely on your memory.  Also, try to do things at the same time of day, such as taking your medication or checking your calendar first thing in the morning. Think about think that you can do as a part of a regular routine and make a list.  Then enter them into a daily planner to remind you.  This will help you establish a routine.  2)  Organize your environment. Organize your environment so that it is uncluttered.  Decrease visual stimulation.  Place everyday items such as keys or cell phone in the same place every day (ie.  Basket next to front door) Use post it notes with a brief message to yourself (ie. Turn off light, lock the door) Use labels to indicate where things go (ie. Which cupboards are for food, dishes, etc.) Keep a notepad and pen by the telephone to take messages  3)  Memory Aids  A diary or journal/notebook/daily planner Making a list (shopping list, chore list, to do list that needs to be done) Using an alarm as a reminder (kitchen timer or cell phone alarm) Using cell phone to store information (Notes, Calendar, Reminders) Calendar/White board placed in a prominent position Post-it notes  In order for memory aids to be useful, you need to have good habits.  It's no good remembering to make a note in your journal if you don't remember to look in it.  Try setting aside a certain time of day to look in journal.  4)  Improving mood and managing  fatigue. There may be other factors that contribute to memory difficulties.  Factors, such as anxiety, depression and tiredness can affect memory. Regular gentle exercise can help improve your mood and give you more energy. Simple relaxation techniques may help relieve symptoms of anxiety Try to get back to completing activities or hobbies you enjoyed doing in the past. Learn to pace yourself through activities to decrease fatigue. Find out about some local support groups where you can share experiences with others. Try and achieve 7-8 hours of sleep at night.   Memory Compensation Strategies  Use WARM strategy.  W= write it down  A= associate it  R= repeat it  M= make a mental note  2.   You can keep a Glass blower/designer.  Use a 3-ring notebook with sections for the following: calendar, important names and phone numbers,  medications, doctors' names/phone numbers, lists/reminders, and a section to journal what you did  each day.   3.    Use a calendar to write appointments down.  4.    Write yourself a schedule for the day.  This can be placed on the calendar or in a separate section of the Memory Notebook.  Keeping a  regular schedule can help memory.  5.    Use medication organizer with sections for each day or morning/evening pills.  You may need help loading it  6.    Keep a basket, or pegboard by the door.  Place items that you need to take out with you in the basket or on the pegboard.  You may also want to  include a message board for reminders.  7.    Use sticky notes.  Place sticky notes with reminders in a place where the task is performed.  For example:  turn off the  stove placed by the stove, lock the door placed on the door at eye level,  take your medications on  the bathroom mirror or by the place where you normally take your medications.  8.    Use alarms/timers.  Use while cooking to remind yourself to check on food or as a reminder to take your medicine,  or as a  reminder to make a call, or as a reminder to perform another task, etc.   Following is a copy of your plan of care:  There are no care plans that you recently modified to display for this patient.

## 2024-08-16 ENCOUNTER — Encounter: Payer: Self-pay | Admitting: Cardiology

## 2024-08-16 ENCOUNTER — Ambulatory Visit: Attending: Cardiology | Admitting: Cardiology

## 2024-08-16 VITALS — BP 122/84 | HR 83 | Ht 60.0 in | Wt 172.4 lb

## 2024-08-16 DIAGNOSIS — I10 Essential (primary) hypertension: Secondary | ICD-10-CM | POA: Diagnosis not present

## 2024-08-16 DIAGNOSIS — E7800 Pure hypercholesterolemia, unspecified: Secondary | ICD-10-CM | POA: Insufficient documentation

## 2024-08-16 DIAGNOSIS — Z7689 Persons encountering health services in other specified circumstances: Secondary | ICD-10-CM | POA: Diagnosis not present

## 2024-08-16 NOTE — Patient Instructions (Signed)
 Medication Instructions:   Your physician recommends that you continue on your current medications as directed. Please refer to the Current Medication list given to you today.    *If you need a refill on your cardiac medications before your next appointment, please call your pharmacy*  Lab Work:  None ordered at this time   If you have labs (blood work) drawn today and your tests are completely normal, you will receive your results only by:  MyChart Message (if you have MyChart) OR  A paper copy in the mail If you have any lab test that is abnormal or we need to change your treatment, we will call you to review the results.  Testing/Procedures:  None ordered at this time   Referrals:  None ordered at this time   Follow-Up:  At [facility], you and your health needs are our priority.  As part of our continuing mission to provide you with exceptional heart care, our providers are all part of one team.  This team includes your primary Cardiologist (physician) and Advanced Practice Providers or APPs (Physician Assistants and Nurse Practitioners) who all work together to provide you with the care you need, when you need it.  Your next appointment:    As Needed  Provider:    You may see Redell Cave, MD or one of the following Advanced Practice Providers on your designated Care Team:   Lonni Meager, NP Lesley Maffucci, PA-C Bernardino Bring, PA-C Cadence [place], PA-C Tylene Lunch, NP Barnie Hila, NP    We recommend signing up for the patient portal called MyChart.  Sign up information is provided on this After Visit Summary.  MyChart is used to connect with patients for Virtual Visits (Telemedicine).  Patients are able to view lab/test results, encounter notes, upcoming appointments, etc.  Non-urgent messages can be sent to your provider as well.   To learn more about what you can do with MyChart, go to [email or web address].

## 2024-08-16 NOTE — Progress Notes (Signed)
 Cardiology Office Note:    Date:  08/16/2024   ID:  Anna Tapia, DOB 1991-09-12, MRN [id number]  PCP:  Bernardo Fend, DO   [facility] Providers Cardiologist:  Redell Cave, MD     Referring MD: Bernardo Fend, DO   Chief Complaint  Patient presents with   Follow-up    12 month follow up pt has  complaints of chest pain on Saturday,   no chest pressure or SOB, medciation reviewed verbally with patient    History of Present Illness:    Anna Tapia is a 33 y.o. 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  Previously seen with symptoms of chest pain, shortness of breath.  Workup with echo and Lexiscan  Myoview  were all unrevealing.  Denies any complaints today, has no bleeding issues.  Started on Benicar for BP control, recently titrated to 40 mg daily with good effect.  BP well-controlled at home, denies any shortness of breath or cough.   Prior notes/testing Echo 5/24 EF 60 to 65%, diastolic function normal, GLS normal Lexiscan  Myoview  5/24 no ischemia, low risk study. Outside echo normal systolic function, 07/2022 EF 58%, GLS -20.9%.  No significant change from prior.  Past Medical History:  Diagnosis Date   Arteriovenous malformation of brain    a. s/p remote coiling.   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   Congenital CHF (congestive heart failure) (HCC)    a. 03/2023 Echo: EF 60-65%, no rwma, nl RV fxn, RVSP 29.66mmHg. Mild MR. Mild Ao sclerosis w/o stenosis. Ao root 38mm.   COVID 2022   Hemorrhagic stroke (HCC) 2023   a. L-sided midbrain, thalamic ICH in setting of PCA/ACA aneurysm s/p embolization.   History of broken collarbone 2020   Hydrocephalus (HCC)    a. s/p VP shunt in childhood.   Precordial chest pain    a. 03/2023 MV: No ischemia or infarct.  EF greater  than 65%.  No significant coronary calcification.   Sepsis secondary to UTI (HCC) 2021    Past Surgical History:  Procedure Laterality Date   VENTRICULOPERITONEAL SHUNT      Current Medications: Current Meds  Medication Sig   albuterol  (VENTOLIN  HFA) 108 (90 Base) MCG/ACT inhaler Inhale 2 puffs into the lungs every 6 (six) hours as needed for wheezing or shortness of breath.   baclofen  (LIORESAL ) 10 MG tablet TAKE ONE-HALF TABLET BY MOUTH IN THE MORNING, ONE TABLET AT LUNCH, AND 2 TABLETS AT NIGHT   DULoxetine  (CYMBALTA ) 20 MG capsule Take 2 capsules by mouth once daily   fluticasone -salmeterol (ADVAIR) 100-50 MCG/ACT AEPB Inhale 1 puff into the lungs 2 (two) times daily.   olmesartan (BENICAR) 20 MG tablet Take 1 tablet (20 mg total) by mouth daily. (Patient taking differently: Take 40 mg by mouth daily. Patient reports taking 40 mg daily)   pregabalin (LYRICA) 50 MG capsule Take 50 mg by mouth 2 (two) times daily.     Allergies:   Lisinopril    Social History   Socioeconomic History   Marital status: Single    Spouse name: Not on file   Number of children: Not on file   Years of education: Not on file   Highest education level: Associate degree: occupational, Scientist, product/process development, or vocational program  Occupational History   Not on file  Tobacco Use  Smoking status: Former    Current packs/day: 0.00    Types: E-cigarettes, Cigarettes    Start date: 2007    Quit date: 2021    Years since quitting: 4.7    Passive exposure: Past   Smokeless tobacco: Never  Vaping Use   Vaping status: Former  Substance and Sexual Activity   Alcohol use: Not Currently   Drug use: Never   Sexual activity: Not Currently    Partners: Male    Birth control/protection: Abstinence  Other Topics Concern   Not on file  Social History Narrative   Not on file   Social Drivers of Health   Financial Resource Strain: Medium Risk (08/15/2024)   Overall Financial Resource Strain (CARDIA)    Difficulty  of Paying Living Expenses: Somewhat hard  Food Insecurity: Food Insecurity Present (08/15/2024)   Hunger Vital Sign    Worried About Running Out of Food in the Last Year: Sometimes true    Ran Out of Food in the Last Year: Sometimes true  Transportation Needs: No Transportation Needs (08/15/2024)   PRAPARE - Administrator, Civil Service (Medical): No    Lack of Transportation (Non-Medical): No  Physical Activity: Insufficiently Active (08/15/2024)   Exercise Vital Sign    Days of Exercise per Week: 2 days    Minutes of Exercise per Session: 10 min  Stress: No Stress Concern Present (08/15/2024)   [employer] of Occupational Health - Occupational Stress Questionnaire    Feeling of Stress: Only a little  Recent Concern: Stress - Stress Concern Present (06/18/2024)   [employer] of Occupational Health - Occupational Stress Questionnaire    Feeling of Stress: To some extent  Social Connections: Socially Isolated (08/15/2024)   Social Connection and Isolation Panel    Frequency of Communication with Friends and Family: Three times a week    Frequency of Social Gatherings with Friends and Family: Once a week    Attends Religious Services: Never    Database administrator or Organizations: No    Attends Engineer, structural: Not on file    Marital Status: Never married     Family History: The patient's family history includes Cancer in her maternal aunt and maternal uncle; Diabetes in her mother; Hypertension in her mother; Seizures in her mother.  ROS:   Please see the history of present illness.     All other systems reviewed and are negative.  EKGs/Labs/Other Studies Reviewed:    The following studies were reviewed today:   EKG:  EKG is  ordered today.  The ekg ordered today demonstrates normal sinus rhythm.  Recent Labs: 04/02/2024: ALT 19 05/20/2024: BUN 7; Creatinine, Ser 0.64; Hemoglobin 12.1; Platelets 334; Potassium 3.6; Sodium 139  Recent  Lipid Panel    Component Value Date/Time   CHOL 202 (H) 04/02/2024 0945   TRIG 56 04/02/2024 0945   HDL 68 04/02/2024 0945   CHOLHDL 3.0 04/02/2024 0945   LDLCALC 119 (H) 04/02/2024 0945     Risk Assessment/Calculations:             Physical Exam:    VS:  BP 122/84 (BP Location: Left Arm, Patient Position: Sitting, Cuff Size: Normal)   Pulse 83   Ht 5' (1.524 m)   Wt 172 lb 6.4 oz (78.2 kg)   SpO2 98%   BMI 33.67 kg/m     Wt Readings from Last 3 Encounters:  08/16/24 172 lb 6.4 oz (78.2 kg)  08/13/24 172  lb (78 kg)  08/02/24 175 lb 9.6 oz (79.7 kg)     GEN:  Well nourished, appears tired, soft-spoken HEENT: Normal NECK: No JVD; No carotid bruits CARDIAC: RRR, no murmurs, rubs, gallops RESPIRATORY:  Clear to auscultation without rales, wheezing or rhonchi  ABDOMEN: Soft, non-tender, non-distended MUSCULOSKELETAL:  No edema; right arm weakness SKIN: Warm and dry NEUROLOGIC:  Alert and oriented x 3, right arm weakness PSYCHIATRIC:  Normal affect   ASSESSMENT:    1. Primary hypertension   2. Pure hypercholesterolemia    PLAN:    In order of problems listed above:  Hypertension, BP controlled.  Continue Benicar 20 mg daily. Hyperlipidemia, low-cholesterol, heart healthy diet advised.  Follow-up as needed.      Medication Adjustments/Labs and Tests Ordered: Current medicines are reviewed at length with the patient today.  Concerns regarding medicines are outlined above.  Orders Placed This Encounter  Procedures   EKG 12-Lead   No orders of the defined types were placed in this encounter.   Patient Instructions  Medication Instructions:   Your physician recommends that you continue on your current medications as directed. Please refer to the Current Medication list given to you today.    *If you need a refill on your cardiac medications before your next appointment, please call your pharmacy*  Lab Work:  None ordered at this time   If you have  labs (blood work) drawn today and your tests are completely normal, you will receive your results only by:  MyChart Message (if you have MyChart) OR  A paper copy in the mail If you have any lab test that is abnormal or we need to change your treatment, we will call you to review the results.  Testing/Procedures:  None ordered at this time   Referrals:  None ordered at this time   Follow-Up:  At [facility], you and your health needs are our priority.  As part of our continuing mission to provide you with exceptional heart care, our providers are all part of one team.  This team includes your primary Cardiologist (physician) and Advanced Practice Providers or APPs (Physician Assistants and Nurse Practitioners) who all work together to provide you with the care you need, when you need it.  Your next appointment:    As Needed  Provider:    You may see Redell Cave, MD or one of the following Advanced Practice Providers on your designated Care Team:   Lonni Meager, NP Lesley Maffucci, PA-C Bernardino Bring, PA-C Cadence [place], PA-C Tylene Lunch, NP Barnie Hila, NP    We recommend signing up for the patient portal called MyChart.  Sign up information is provided on this After Visit Summary.  MyChart is used to connect with patients for Virtual Visits (Telemedicine).  Patients are able to view lab/test results, encounter notes, upcoming appointments, etc.  Non-urgent messages can be sent to your provider as well.   To learn more about what you can do with MyChart, go to [email or web address].    Signed, Redell Cave, MD  08/16/2024 11:22 AM    [facility]

## 2024-08-17 ENCOUNTER — Other Ambulatory Visit: Payer: Self-pay

## 2024-08-17 ENCOUNTER — Ambulatory Visit (INDEPENDENT_AMBULATORY_CARE_PROVIDER_SITE_OTHER): Admitting: Internal Medicine

## 2024-08-17 ENCOUNTER — Ambulatory Visit: Admitting: Internal Medicine

## 2024-08-17 ENCOUNTER — Encounter: Payer: Self-pay | Admitting: Internal Medicine

## 2024-08-17 VITALS — BP 124/78 | HR 100 | Temp 98.3°F | Resp 16 | Ht 60.0 in | Wt 172.2 lb

## 2024-08-17 DIAGNOSIS — I6930 Unspecified sequelae of cerebral infarction: Secondary | ICD-10-CM

## 2024-08-17 DIAGNOSIS — I10 Essential (primary) hypertension: Secondary | ICD-10-CM | POA: Diagnosis not present

## 2024-08-17 MED ORDER — OLMESARTAN MEDOXOMIL 40 MG PO TABS: 40.0000 mg | ORAL_TABLET | Freq: Every day | ORAL | 1 refills | Status: AC

## 2024-08-17 NOTE — Progress Notes (Signed)
 Established Patient Office Visit  Subjective    Patient ID: Anna Tapia, female    DOB: 26-Apr-1991  Age: 33 y.o. MRN: [id number]  CC:  Chief Complaint  Patient presents with   Hypertension    2 week recheck    HPI Anna Tapia presents for recheck of blood pressure.   Discussed the use of AI scribe software for clinical note transcription with the patient, who gave verbal consent to proceed.  History of Present Illness  Anna Tapia is a 33 year old female with hypertension who presents for blood pressure management.  Her blood pressure was 124/78 mmHg at a recent cardiology appointment. She is currently taking Benicar 40 mg once daily, having increased from 20 mg after a week without significant change in blood pressure. She experiences no side effects such as cough or symptoms of hypotension.  She has a history of stroke and lives with her mother. She independently manages daily activities including cooking, bathing, dressing, and medication administration. She does not drive but uses Medicaid-supported transportation services and occasionally receives rides from her aunt.  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    Outpatient Encounter Medications as of 08/17/2024  Medication Sig   albuterol  (VENTOLIN  HFA) 108 (90 Base) MCG/ACT inhaler Inhale 2 puffs into the lungs every 6 (six) hours as needed for wheezing or shortness of breath.   baclofen  (LIORESAL ) 10 MG tablet TAKE ONE-HALF TABLET BY MOUTH IN THE MORNING, ONE TABLET AT LUNCH, AND 2 TABLETS AT NIGHT   DULoxetine  (CYMBALTA ) 20 MG capsule Take 2 capsules by mouth once daily   fluticasone -salmeterol (ADVAIR) 100-50 MCG/ACT AEPB Inhale 1 puff into the lungs 2 (two) times daily.   olmesartan (BENICAR) 20 MG tablet Take 1 tablet (20 mg total) by mouth daily.   pregabalin (LYRICA) 50 MG capsule Take 50 mg by mouth  2 (two) times daily.   No facility-administered encounter medications on file as of 08/17/2024.    Past Medical History:  Diagnosis Date   Arteriovenous malformation of brain    a. s/p remote coiling.   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   Congenital CHF (congestive heart failure) (HCC)    a. 03/2023 Echo: EF 60-65%, no rwma, nl RV fxn, RVSP 29.24mmHg. Mild MR. Mild Ao sclerosis w/o stenosis. Ao root 38mm.   COVID 2022   Hemorrhagic stroke (HCC) 2023   a. L-sided midbrain, thalamic ICH in setting of PCA/ACA aneurysm s/p embolization.   History of broken collarbone 2020   Hydrocephalus (HCC)    a. s/p VP shunt in childhood.   Precordial chest pain    a. 03/2023 MV: No ischemia or infarct.  EF greater than 65%.  No significant coronary calcification.   Sepsis secondary to UTI [facility]) 2021    Past Surgical History:  Procedure Laterality Date   VENTRICULOPERITONEAL SHUNT      Family History  Problem Relation Age of Onset   Seizures Mother    Hypertension Mother    Diabetes Mother    Cancer Maternal Aunt    Cancer Maternal Uncle     Social History   Socioeconomic History   Marital status: Single    Spouse name: Not on file   Number of children: Not on file   Years of education: Not on file   Highest education level: Associate degree:  occupational, Scientist, product/process development, or vocational program  Occupational History   Not on file  Tobacco Use   Smoking status: Former    Current packs/day: 0.00    Types: E-cigarettes, Cigarettes    Start date: 2007    Quit date: 2021    Years since quitting: 4.7    Passive exposure: Past   Smokeless tobacco: Never  Vaping Use   Vaping status: Former  Substance and Sexual Activity   Alcohol use: Not Currently   Drug use: Never   Sexual activity: Not Currently    Partners: Male    Birth control/protection: Abstinence  Other Topics Concern   Not on file  Social History Narrative   Not on file   Social  Drivers of Health   Financial Resource Strain: Medium Risk (08/15/2024)   Overall Financial Resource Strain (CARDIA)    Difficulty of Paying Living Expenses: Somewhat hard  Food Insecurity: Food Insecurity Present (08/15/2024)   Hunger Vital Sign    Worried About Running Out of Food in the Last Year: Sometimes true    Ran Out of Food in the Last Year: Sometimes true  Transportation Needs: No Transportation Needs (08/15/2024)   PRAPARE - Administrator, Civil Service (Medical): No    Lack of Transportation (Non-Medical): No  Physical Activity: Insufficiently Active (08/15/2024)   Exercise Vital Sign    Days of Exercise per Week: 2 days    Minutes of Exercise per Session: 10 min  Stress: No Stress Concern Present (08/15/2024)   [employer] of Occupational Health - Occupational Stress Questionnaire    Feeling of Stress: Only a little  Recent Concern: Stress - Stress Concern Present (06/18/2024)   [employer] of Occupational Health - Occupational Stress Questionnaire    Feeling of Stress: To some extent  Social Connections: Socially Isolated (08/15/2024)   Social Connection and Isolation Panel    Frequency of Communication with Friends and Family: Three times a week    Frequency of Social Gatherings with Friends and Family: Once a week    Attends Religious Services: Never    Database administrator or Organizations: No    Attends Engineer, structural: Not on file    Marital Status: Never married  Intimate Partner Violence: Not At Risk (08/13/2024)   Humiliation, Afraid, Rape, and Kick questionnaire    Fear of Current or Ex-Partner: No    Emotionally Abused: No    Physically Abused: No    Sexually Abused: No    Review of Systems  All other systems reviewed and are negative.       Objective    BP 124/78 (Cuff Size: Large)   Pulse 100   Temp 98.3 F (36.8 C) (Oral)   Resp 16   Ht 5' (1.524 m)   Wt 172 lb 3.2 oz (78.1 kg)   SpO2 98%   BMI  33.63 kg/m   Vitals:   08/17/24 0826  BP: 124/78    Physical Exam Constitutional:      Appearance: Normal appearance.  HENT:     Head: Normocephalic and atraumatic.  Eyes:     Conjunctiva/sclera: Conjunctivae normal.  Cardiovascular:     Rate and Rhythm: Normal rate and regular rhythm.  Pulmonary:     Effort: Pulmonary effort is normal.     Breath sounds: Normal breath sounds.  Musculoskeletal:     Right lower leg: No edema.     Left lower leg: No edema.  Skin:  General: Skin is warm and dry.  Neurological:     Mental Status: She is alert. Mental status is at baseline.  Psychiatric:        Mood and Affect: Mood normal.        Behavior: Behavior normal.         Assessment & Plan:   Assessment & Plan  Essential hypertension Blood pressure well-controlled on Phenicar 40 mg. No side effects reported. 40 mg dose effective and well-tolerated. - Prescribe Phenicar 40 mg for six months.  Sequela of cerebrovascular accident (stroke) Independent in activities of daily living. Considering living independently. Aware of limitations and takes breaks as needed. No significant improvement over two years. - Print a letter stating her ability to live independently.  - olmesartan (BENICAR) 40 MG tablet; Take 1 tablet (40 mg total) by mouth daily.  Dispense: 90 tablet; Refill: 1   Return for already scheduled.   Sharyle Fischer, DO

## 2024-08-18 DIAGNOSIS — Z7689 Persons encountering health services in other specified circumstances: Secondary | ICD-10-CM | POA: Diagnosis not present

## 2024-08-19 ENCOUNTER — Ambulatory Visit: Admitting: Physical Therapy

## 2024-08-19 ENCOUNTER — Ambulatory Visit

## 2024-08-20 ENCOUNTER — Ambulatory Visit: Admitting: Physical Therapy

## 2024-08-23 DIAGNOSIS — Z7689 Persons encountering health services in other specified circumstances: Secondary | ICD-10-CM | POA: Diagnosis not present

## 2024-08-25 ENCOUNTER — Telehealth: Payer: Self-pay | Admitting: *Deleted

## 2024-08-25 ENCOUNTER — Encounter: Payer: Self-pay | Admitting: *Deleted

## 2024-08-25 NOTE — Patient Instructions (Signed)
 Anna Tapia - I am sorry I was unable to reach you today for our scheduled appointment. I work with Bernardo Fend, DO and am calling to support your healthcare needs. Please contact me at [phone] at your earliest convenience. I look forward to speaking with you soon.   Thank you,    Mikiyah Glasner, LCSW [place]  [facility], [facility] Licensed Clinical Social Worker  Direct Dial: [phone]

## 2024-08-26 ENCOUNTER — Ambulatory Visit: Admitting: Physical Therapy

## 2024-08-26 ENCOUNTER — Ambulatory Visit

## 2024-09-02 ENCOUNTER — Ambulatory Visit: Admitting: Physical Therapy

## 2024-09-07 DIAGNOSIS — Z7689 Persons encountering health services in other specified circumstances: Secondary | ICD-10-CM | POA: Diagnosis not present

## 2024-09-08 ENCOUNTER — Other Ambulatory Visit: Payer: Self-pay | Admitting: *Deleted

## 2024-09-08 NOTE — Patient Instructions (Signed)
 Visit Information  Thank you for taking time to visit with me today. Please don't hesitate to contact me if I can be of assistance to you before our next scheduled appointment.  Your next care management appointment is by telephone on 1126/25 at 11:30am   Please call the care guide team at [phone] if you need to cancel, schedule, or reschedule an appointment.   Please call the Suicide and Crisis Lifeline: 988 call the [place]  National Suicide Prevention Lifeline: (530) 414-7672 or TTY: 628-255-5406 TTY (475)865-4222) to talk to a trained counselor call 1-800-273-TALK (toll free, 24 hour hotline) call 911 if you are experiencing a Mental Health or Behavioral Health Crisis or need someone to talk to.  Davidmichael Zarazua, LCSW [place]  [facility], [facility] Licensed Clinical Social Worker  Direct Dial: [phone]

## 2024-09-08 NOTE — Patient Outreach (Signed)
 Complex Care Management   Visit Note  09/08/2024  Name:  Anna Tapia MRN: [id number] DOB: 05-31-1991  Situation: Referral received for Complex Care Management related to Mental/Behavioral Health diagnosis depression and anxiety I obtained verbal consent from Patient.  Visit completed with Patient  on the phone  Background:   Past Medical History:  Diagnosis Date   Arteriovenous malformation of brain    a. s/p remote coiling.   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   Congenital CHF (congestive heart failure) (HCC)    a. 03/2023 Echo: EF 60-65%, no rwma, nl RV fxn, RVSP 29.1mmHg. Mild MR. Mild Ao sclerosis w/o stenosis. Ao root 38mm.   COVID 2022   Hemorrhagic stroke (HCC) 2023   a. L-sided midbrain, thalamic ICH in setting of PCA/ACA aneurysm s/p embolization.   History of broken collarbone 2020   Hydrocephalus (HCC)    a. s/p VP shunt in childhood.   Precordial chest pain    a. 03/2023 MV: No ischemia or infarct.  EF greater than 65%.  No significant coronary calcification.   Sepsis secondary to UTI [facility]) 2021    Assessment: Patient Reported Symptoms:  Cognitive        Neurological Neurological Review of Symptoms: No symptoms reported    HEENT HEENT Symptoms Reported: No symptoms reported      Cardiovascular Cardiovascular Symptoms Reported: No symptoms reported    Respiratory Respiratory Symptoms Reported: No symptoms reported    Endocrine Endocrine Symptoms Reported: No symptoms reported Is patient diabetic?: No    Gastrointestinal Gastrointestinal Symptoms Reported: No symptoms reported      Genitourinary Genitourinary Symptoms Reported: No symptoms reported    Integumentary Integumentary Symptoms Reported: No symptoms reported    Musculoskeletal Additional Musculoskeletal Details: problems with right hip since the stroke- uses a cain whem going long distances-provider informed-knee pain-osteoarthritis in knee after  evaluation from orthopedic doctor-couple of weeks ago-recommended using ibuprofen  Musculoskeletal Management Strategies: Coping strategies, Medication therapy, Routine screening Falls in the past year?: No    Psychosocial Psychosocial Symptoms Reported: Depression - if selected complete PHQ 2-9, Anxiety - if selected complete GAD Additional Psychological Details: symptoms have improved-continues to go to the support group, completed assessment with [facility] 09/07/24 Behavioral Management Strategies: Coping strategies, Counseling, Medication therapy Major Change/Loss/Stressor/Fears (CP): Medical condition, self Behaviors When Feeling Stressed/Fearful: attends support group, remains around family Techniques to [place] with Loss/Stress/Change: Diversional activities Quality of Family Relationships: involved, supportive Do you feel physically threatened by others?: No    09/08/2024    PHQ2-9 Depression Screening   Little interest or pleasure in doing things Several days  Feeling down, depressed, or hopeless Not at all  PHQ-2 - Total Score 1  Trouble falling or staying asleep, or sleeping too much    Feeling tired or having little energy    Poor appetite or overeating     Feeling bad about yourself - or that you are a failure or have let yourself or your family down    Trouble concentrating on things, such as reading the newspaper or watching television    Moving or speaking so slowly that other people could have noticed.  Or the opposite - being so fidgety or restless that you have been moving around a lot more than usual    Thoughts that you would be better off dead, or hurting yourself in some way    PHQ2-9 Total Score    If  you checked off any problems, how difficult have these problems made it for you to do your work, take care of things at home, or get along with other people    Depression Interventions/Treatment      There were no vitals filed for this visit.  Medications  Reviewed Today     Reviewed by Ermalinda Lenn HERO, LCSW (Social Worker) on 09/08/24 at [phone]  Med List Status: <None>   Medication Order Taking? Sig Documenting Provider Last Dose Status Informant  albuterol  (VENTOLIN  HFA) 108 (90 Base) MCG/ACT inhaler [id number] Yes Inhale 2 puffs into the lungs every 6 (six) hours as needed for wheezing or shortness of breath. Bernardo Fend, DO  Active   baclofen  (LIORESAL ) 10 MG tablet [id number] Yes TAKE ONE-HALF TABLET BY MOUTH IN THE MORNING, ONE TABLET AT LUNCH, AND 2 TABLETS AT ADA Bernardo Fend, DO  Active   DULoxetine  (CYMBALTA ) 20 MG capsule [id number] Yes Take 2 capsules by mouth once daily Bernardo Fend, DO  Active   fluticasone -salmeterol (ADVAIR) 100-50 MCG/ACT AEPB [id number] Yes Inhale 1 puff into the lungs 2 (two) times daily. Bernardo Fend, DO  Active   olmesartan (BENICAR) 40 MG tablet [id number] Yes Take 1 tablet (40 mg total) by mouth daily. Bernardo Fend, DO  Active   pregabalin (LYRICA) 50 MG capsule [id number] Yes Take 50 mg by mouth 2 (two) times daily. [provider]  Active             Recommendation:   PCP Follow-up [facility]   Follow Up Plan:   Telephone follow up appointment date/time:  09/29/24  Lenn Ermalinda, LCSW [place]  [facility], [facility] Licensed Clinical Social Worker  Direct Dial: [phone]

## 2024-09-09 ENCOUNTER — Ambulatory Visit

## 2024-09-09 ENCOUNTER — Ambulatory Visit: Admitting: Physical Therapy

## 2024-09-09 DIAGNOSIS — Z7689 Persons encountering health services in other specified circumstances: Secondary | ICD-10-CM | POA: Diagnosis not present

## 2024-09-10 ENCOUNTER — Other Ambulatory Visit: Payer: Self-pay

## 2024-09-10 NOTE — Patient Outreach (Signed)
 Complex Care Management   Visit Note  09/10/2024  Name:  Anna Tapia MRN: [id number] DOB: [birth year]  Situation: Referral received for Complex Care Management related to Heart Failure and HTN I obtained verbal consent from Patient.  Visit completed with Patient  on the phone  Background:   Past Medical History:  Diagnosis Date   Arteriovenous malformation of brain    a. s/p remote coiling.   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   Congenital CHF (congestive heart failure) (HCC)    a. 03/2023 Echo: EF 60-65%, no rwma, nl RV fxn, RVSP 29.75mmHg. Mild MR. Mild Ao sclerosis w/o stenosis. Ao root 38mm.   COVID 2022   Hemorrhagic stroke (HCC) 2023   a. L-sided midbrain, thalamic ICH in setting of PCA/ACA aneurysm s/p embolization.   History of broken collarbone 2020   Hydrocephalus (HCC)    a. s/p VP shunt in childhood.   Precordial chest pain    a. 03/2023 MV: No ischemia or infarct.  EF greater than 65%.  No significant coronary calcification.   Sepsis secondary to UTI [facility]) 2021    Assessment: Patient Reported Symptoms:  Cognitive Cognitive Status: Struggling with memory recall Cognitive/Intellectual Conditions Management [RPT]: None reported or documented in medical history or problem list Brain Injury: short term memory issues - uses phne for reminder and no issues   Health Maintenance Behaviors: Annual physical exam, Exercise, Healthy diet, Sleep adequate Health Facilitated by: Healthy diet  Neurological Neurological Review of Symptoms: Numbness Neurological Comment: no change numb in face and hand - no dizziness, spacticity managed with baclofen , 09/22/24 injections scheduled - will ask not to give in hand thinks caused tremors, interfere with use  HEENT HEENT Symptoms Reported: No symptoms reported HEENT Management Strategies: Routine screening    Cardiovascular Cardiovascular Symptoms Reported: No symptoms reported Does patient  have uncontrolled Hypertension?: No Cardiovascular Management Strategies: Medication therapy, Routine screening, Diet modification Weight: 172 lb (78 kg) Cardiovascular Comment: BP stable with Olmesartan 126/82, weight stable no HF sx no elevated BP, received mailed info, no questions  Respiratory Respiratory Symptoms Reported: No symptoms reported Respiratory Management Strategies: Routine screening  Endocrine Endocrine Symptoms Reported: Not assessed Is patient diabetic?: No    Gastrointestinal Gastrointestinal Symptoms Reported: No symptoms reported      Genitourinary Genitourinary Symptoms Reported: No symptoms reported    Integumentary Integumentary Symptoms Reported: No symptoms reported    Musculoskeletal Musculoskelatal Symptoms Reviewed: Limited mobility, Unsteady gait, Joint pain Additional Musculoskeletal Details: cane prn, right hip pain continues with long walks, uses Voltaren on hip/knee ith improvement Musculoskeletal Management Strategies: Routine screening Falls in the past year?: No Was there an injury with Fall?: No Patient at Risk for Falls Due to: History of fall(s), Impaired mobility, Impaired balance/gait, Orthopedic patient Fall risk Follow up: Falls evaluation completed, Falls prevention discussed  Psychosocial Psychosocial Symptoms Reported: Depression - if selected complete PHQ 2-9 Additional Psychological Details: support [facility] scheduled [facility] Monday [facility] - still sad knowing still like this, completed eval with [name] and awaiting scheduling date, set up with peer support person, waiting on disability determination  could take up to 6 months, looking in to moving on own, discussed reconnecting with BSW regarding housing when ready and can always call RNCM Behavioral Management Strategies: Coping strategies, Support group, Medication therapy Major Change/Loss/Stressor/Fears (CP): Medical condition, self Techniques to Cope with  Loss/Stress/Change: Diversional activities, Support group  09/10/2024    PHQ2-9 Depression Screening   Little interest or pleasure in doing things Several days  Feeling down, depressed, or hopeless Nearly every day (feeling down about health status)  PHQ-2 - Total Score 4  Trouble falling or staying asleep, or sleeping too much    Feeling tired or having little energy    Poor appetite or overeating     Feeling bad about yourself - or that you are a failure or have let yourself or your family down    Trouble concentrating on things, such as reading the newspaper or watching television    Moving or speaking so slowly that other people could have noticed.  Or the opposite - being so fidgety or restless that you have been moving around a lot more than usual    Thoughts that you would be better off dead, or hurting yourself in some way    PHQ2-9 Total Score    If you checked off any problems, how difficult have these problems made it for you to do your work, take care of things at home, or get along with other people    Depression Interventions/Treatment      There were no vitals filed for this visit.  Medications Reviewed Today     Reviewed by Devra Lands, RN (Registered Nurse) on 09/10/24 at 1004  Med List Status: <None>   Medication Order Taking? Sig Documenting Provider Last Dose Status Informant  albuterol  (VENTOLIN  HFA) 108 (90 Base) MCG/ACT inhaler [id number] Yes Inhale 2 puffs into the lungs every 6 (six) hours as needed for wheezing or shortness of breath. Bernardo Fend, DO  Active   baclofen  (LIORESAL ) 10 MG tablet [id number] Yes TAKE ONE-HALF TABLET BY MOUTH IN THE MORNING, ONE TABLET AT LUNCH, AND 2 TABLETS AT ADA Bernardo Fend, DO  Active   DULoxetine  (CYMBALTA ) 20 MG capsule [id number] Yes Take 2 capsules by mouth once daily Bernardo Fend, DO  Active   fluticasone -salmeterol (ADVAIR) 100-50 MCG/ACT AEPB [id number] Yes Inhale 1 puff into the lungs 2 (two)  times daily. Bernardo Fend, DO  Active   olmesartan (BENICAR) 40 MG tablet [id number] Yes Take 1 tablet (40 mg total) by mouth daily. Bernardo Fend, DO  Active   pregabalin (LYRICA) 50 MG capsule [id number] Yes Take 50 mg by mouth 2 (two) times daily. [provider]  Active             Recommendation:   PCP Follow-up Continue Current Plan of Care  Follow Up Plan:   Telephone follow-up in 1 month  Lands Devra, MSN, RN [facility]  [facility], [facility] RN Care Manager Direct Dial: [phone] Fax: [phone]

## 2024-09-10 NOTE — Patient Instructions (Signed)
 Visit Information  Anna Tapia was given information about Medicaid Managed Care team care coordination services as a part of their [facility] Medicaid benefit.   If you would like to schedule transportation through your [facility] plan, please call the following number at least 2 days in advance of your appointment: [phone].   You can also use the MTM portal or MTM mobile app to manage your rides. Reimbursement for transportation is available through [facility]! For the portal, please go to [email or web address]  Call the [facility] Crisis Line at [phone], at any time, 24 hours a day, 7 days a week. If you are in danger or need immediate medical attention call 911.  [facility] Asthma Benefits - Call Customer Service on Card for more info: Covers the cost of the whooping cough vaccine for caregivers, ages 17 and older, who live with a pregnant member, Covers the cost of two (2) carpet cleanings per calendar year for members with asthma, Provides one (1) HEPA vacuum cleaner per lifetime at no cost to members with asthma, Members with asthma can get mattress and pillow casings up to $100 per year. These covers are known as hypoallergenic bedding, Provides qualified members with peak flow meters to track their asthma symptoms. Preauthorization not required, [facility]  will offer pest control services for members with a history of asthma or other pest-related issues that could affect the member's health. Qualified members can get up to four (4) pest-control services per household per year.   Please see education materials related to [facility] Asthma Benefit provided as financial risk analyst.   Care plan and visit instructions communicated with the patient verbally today. Patient agrees to receive a copy via mail.   Telephone follow up appointment with Managed Medicaid care management team member scheduled for: 10/08/2024 at 10:00 am.  [name], MSN, RN [facility]   [facility], [facility] RN Care Manager Direct Dial: [phone] Fax: [phone]   Following is a copy of your plan of care:  There are no care plans that you recently modified to display for this patient.

## 2024-09-13 ENCOUNTER — Encounter: Payer: Self-pay | Admitting: Internal Medicine

## 2024-09-13 DIAGNOSIS — Z7689 Persons encountering health services in other specified circumstances: Secondary | ICD-10-CM | POA: Diagnosis not present

## 2024-09-15 ENCOUNTER — Ambulatory Visit (INDEPENDENT_AMBULATORY_CARE_PROVIDER_SITE_OTHER): Admitting: Internal Medicine

## 2024-09-15 ENCOUNTER — Other Ambulatory Visit: Payer: Self-pay

## 2024-09-15 ENCOUNTER — Encounter: Payer: Self-pay | Admitting: Internal Medicine

## 2024-09-15 ENCOUNTER — Telehealth: Payer: Self-pay

## 2024-09-15 VITALS — BP 128/74 | HR 80 | Temp 98.5°F | Resp 16 | Ht 60.0 in | Wt 173.3 lb

## 2024-09-15 DIAGNOSIS — H6123 Impacted cerumen, bilateral: Secondary | ICD-10-CM

## 2024-09-15 DIAGNOSIS — Z23 Encounter for immunization: Secondary | ICD-10-CM

## 2024-09-15 DIAGNOSIS — Z419 Encounter for procedure for purposes other than remedying health state, unspecified: Secondary | ICD-10-CM | POA: Diagnosis not present

## 2024-09-15 NOTE — Progress Notes (Signed)
   Acute Office Visit  Subjective:     Patient ID: Anna Tapia, female    DOB: April 19, 1991, 33 y.o.   MRN: [id number]  Chief Complaint  Patient presents with   Ear Fullness    HPI Patient is in today for ear fullness.   Discussed the use of AI scribe software for clinical note transcription with the patient, who gave verbal consent to proceed.  History of Present Illness Anna Tapia is a 33 year old female who presents with ear discomfort and sensation of drainage.  She experiences a sensation of drainage in her right ear without associated pain or hearing changes. She is concerned about earwax or sinus problems and seeks ear cleaning. She is familiar with ear cleaning procedures.      Review of Systems  HENT:  Positive for ear discharge. Negative for ear pain, hearing loss and tinnitus.   All other systems reviewed and are negative.       Objective:    BP 128/74 (Cuff Size: Normal)   Pulse 80   Temp 98.5 F (36.9 C) (Oral)   Resp 16   Ht 5' (1.524 m)   Wt 173 lb 4.8 oz (78.6 kg)   SpO2 97%   BMI 33.85 kg/m    Physical Exam Constitutional:      Appearance: Normal appearance.  HENT:     Head: Normocephalic and atraumatic.     Right Ear: Tympanic membrane, ear canal and external ear normal. There is impacted cerumen.     Left Ear: Tympanic membrane, ear canal and external ear normal. There is impacted cerumen.  Eyes:     Conjunctiva/sclera: Conjunctivae normal.  Skin:    General: Skin is warm and dry.  Neurological:     Mental Status: She is alert. Mental status is at baseline.  Psychiatric:        Mood and Affect: Mood normal.        Behavior: Behavior normal.     No results found for any visits on 09/15/24.      Assessment & Plan:   Assessment & Plan Impacted cerumen, bilateral Bilateral wax impaction. No infection or fluid present. - Performed ear irrigation. - Recommended Debrox drops for future symptoms. -  Educated on ear wax protection and management options.  General Health Maintenance Received flu shot. - Rescheduled appointment to February 20th.  - Flu vaccine trivalent PF, 6mos and older(Flulaval,Afluria,Fluarix,Fluzone) - Ear Lavage   Return for can cancel Dec. apt keep 2/20.  Sharyle Fischer, DO

## 2024-09-15 NOTE — Patient Instructions (Signed)
 Anna Tapia - I am sorry I was unable to reach you today for our scheduled appointment. I work with Bernardo Fend, DO and am calling to support your healthcare needs. Please contact me at [phone] at your earliest convenience. I look forward to speaking with you soon.   Thank you,  Thersia Hoar, BSW, MHA [place]  [facility] Social Worker, Population Health [phone]

## 2024-09-16 ENCOUNTER — Ambulatory Visit: Admitting: Physical Therapy

## 2024-09-18 ENCOUNTER — Encounter: Payer: Self-pay | Admitting: Internal Medicine

## 2024-09-22 DIAGNOSIS — I6930 Unspecified sequelae of cerebral infarction: Secondary | ICD-10-CM | POA: Diagnosis not present

## 2024-09-22 DIAGNOSIS — I69351 Hemiplegia and hemiparesis following cerebral infarction affecting right dominant side: Secondary | ICD-10-CM | POA: Diagnosis not present

## 2024-09-23 ENCOUNTER — Ambulatory Visit: Admitting: Physical Therapy

## 2024-09-24 DIAGNOSIS — F4310 Post-traumatic stress disorder, unspecified: Secondary | ICD-10-CM | POA: Diagnosis not present

## 2024-09-25 DIAGNOSIS — F4310 Post-traumatic stress disorder, unspecified: Secondary | ICD-10-CM | POA: Diagnosis not present

## 2024-09-27 DIAGNOSIS — F4310 Post-traumatic stress disorder, unspecified: Secondary | ICD-10-CM | POA: Diagnosis not present

## 2024-09-29 ENCOUNTER — Encounter: Payer: Self-pay | Admitting: *Deleted

## 2024-09-29 ENCOUNTER — Telehealth: Payer: Self-pay | Admitting: *Deleted

## 2024-09-29 NOTE — Patient Instructions (Signed)
 Anna Tapia - I am sorry I was unable to reach you today for our scheduled appointment. I work with Bernardo Fend, DO and am calling to support your healthcare needs. Please contact me at [phone] at your earliest convenience. I look forward to speaking with you soon.   Thank you,    Mikiyah Glasner, LCSW [place]  [facility], [facility] Licensed Clinical Social Worker  Direct Dial: [phone]

## 2024-10-04 ENCOUNTER — Ambulatory Visit: Admitting: Internal Medicine

## 2024-10-07 ENCOUNTER — Ambulatory Visit: Admitting: Physical Therapy

## 2024-10-08 ENCOUNTER — Other Ambulatory Visit: Payer: Self-pay

## 2024-10-08 NOTE — Patient Outreach (Signed)
 Complex Care Management   Visit Note  10/08/2024  Name:  Anna Tapia MRN: [id number] DOB: [birth year]  Situation: Referral received for Complex Care Management related to Heart Failure and HTN I obtained verbal consent from Patient.  Visit completed with Patient  on the phone  Background:   Past Medical History:  Diagnosis Date   Arteriovenous malformation of brain    a. s/p remote coiling.   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   Congenital CHF (congestive heart failure) (HCC)    a. 03/2023 Echo: EF 60-65%, no rwma, nl RV fxn, RVSP 29.87mmHg. Mild MR. Mild Ao sclerosis w/o stenosis. Ao root 38mm.   COVID 2022   Hemorrhagic stroke (HCC) 2023   a. L-sided midbrain, thalamic ICH in setting of PCA/ACA aneurysm s/p embolization.   History of broken collarbone 2020   Hydrocephalus (HCC)    a. s/p VP shunt in childhood.   Precordial chest pain    a. 03/2023 MV: No ischemia or infarct.  EF greater than 65%.  No significant coronary calcification.   Sepsis secondary to UTI [facility]) 2021    Assessment: Patient Reported Symptoms:  Cognitive Cognitive Status: Struggling with memory recall (short term memory unchanged) Cognitive/Intellectual Conditions Management [RPT]: None reported or documented in medical history or problem list      Neurological   Neurological Comment: injections for spacticity, numbness face and hand unchanged  HEENT HEENT Symptoms Reported: Other: HEENT Management Strategies: Routine screening HEENT Comment: nasal congestion at times    Cardiovascular Cardiovascular Symptoms Reported: No symptoms reported Does patient have uncontrolled Hypertension?: No Cardiovascular Management Strategies: Medication therapy, Routine screening, Diet modification Weight: 173 lb (78.5 kg) Cardiovascular Comment: reports BP stable 120s/80's , no sx HF  Respiratory Respiratory Symptoms Reported: No symptoms reported Other Respiratory  Symptoms: discussed sx to call provider re illness Respiratory Management Strategies: Routine screening, Medication therapy  Endocrine Endocrine Symptoms Reported: Not assessed Is patient diabetic?: No    Gastrointestinal Gastrointestinal Symptoms Reported: No symptoms reported      Genitourinary Genitourinary Symptoms Reported: No symptoms reported    Integumentary Integumentary Symptoms Reported: No symptoms reported    Musculoskeletal Musculoskelatal Symptoms Reviewed: Limited mobility, Joint pain, Unsteady gait Additional Musculoskeletal Details: cane prn, no falls, right hip continues to be painful all the time 6/10 Voltaren with little improvement, tylenol  prn for hip and knee (no swelling) Musculoskeletal Management Strategies: Medication therapy, Routine screening Falls in the past year?: No Number of falls in past year: 1 or less Was there an injury with Fall?: No Fall Risk Category Calculator: 0 Patient Fall Risk Level: Low Fall Risk Patient at Risk for Falls Due to: History of fall(s), Impaired mobility, Impaired balance/gait, Orthopedic patient  Psychosocial Additional Psychological Details: attending peer support group, reports mood improving, expects to be scheduled with therapist by next week, disability pending, will look for own housing after approved Behavioral Management Strategies: Coping strategies, Support group        10/08/2024    PHQ2-9 Depression Screening   Little interest or pleasure in doing things Several days  Feeling down, depressed, or hopeless Not at all  PHQ-2 - Total Score 1  Trouble falling or staying asleep, or sleeping too much    Feeling tired or having little energy    Poor appetite or overeating     Feeling bad about yourself - or that you are a failure or have let yourself or your  family down    Trouble concentrating on things, such as reading the newspaper or watching television    Moving or speaking so slowly that other people could  have noticed.  Or the opposite - being so fidgety or restless that you have been moving around a lot more than usual    Thoughts that you would be better off dead, or hurting yourself in some way    PHQ2-9 Total Score    If you checked off any problems, how difficult have these problems made it for you to do your work, take care of things at home, or get along with other people    Depression Interventions/Treatment      Today's Vitals   10/08/24 1018  Weight: 173 lb (78.5 kg)      Medications Reviewed Today     Reviewed by Devra Lands, RN (Registered Nurse) on 10/08/24 at 1015  Med List Status: <None>   Medication Order Taking? Sig Documenting Provider Last Dose Status Informant  albuterol  (VENTOLIN  HFA) 108 (90 Base) MCG/ACT inhaler [id number] Yes Inhale 2 puffs into the lungs every 6 (six) hours as needed for wheezing or shortness of breath. Bernardo Fend, DO  Active   baclofen  (LIORESAL ) 10 MG tablet [id number] Yes TAKE ONE-HALF TABLET BY MOUTH IN THE MORNING, ONE TABLET AT LUNCH, AND 2 TABLETS AT ADA Bernardo Fend, DO  Active   DULoxetine  (CYMBALTA ) 20 MG capsule [id number] Yes Take 2 capsules by mouth once daily Bernardo Fend, DO  Active   fluticasone -salmeterol (ADVAIR) 100-50 MCG/ACT AEPB [id number] Yes Inhale 1 puff into the lungs 2 (two) times daily. Bernardo Fend, DO  Active   olmesartan  (BENICAR ) 40 MG tablet [id number] Yes Take 1 tablet (40 mg total) by mouth daily. Bernardo Fend, DO  Active   pregabalin (LYRICA) 50 MG capsule [id number] Yes Take 50 mg by mouth 2 (two) times daily. [provider]  Active             Recommendation:   PCP Follow-up Continue Current Plan of Care  Follow Up Plan:   Telephone follow-up in 1 month  Lands Devra, MSN, RN [facility]  [facility], [facility] RN Care Manager Direct Dial: [phone] Fax: [phone]

## 2024-10-08 NOTE — Patient Instructions (Signed)
 Visit Information  Anna Tapia was given information about Medicaid Managed Care team care coordination services as a part of their [facility] Medicaid benefit.   If you would like to schedule transportation through your [facility] plan, please call the following number at least 2 days in advance of your appointment: [phone].   You can also use the MTM portal or MTM mobile app to manage your rides. Reimbursement for transportation is available through [facility]! For the portal, please go to [email or web address]  Call the [facility] Crisis Line at [phone], at any time, 24 hours a day, 7 days a week. If you are in danger or need immediate medical attention call 911.  Please see education materials related to NONE provided by MyChart link.  Patient verbalizes understanding of instructions and care plan provided today and agrees to view in MyChart. Active MyChart status and patient understanding of how to access instructions and care plan via MyChart confirmed with patient.     Telephone follow up appointment with Managed Medicaid care management team member scheduled for: 11/01/2024 at 9:30 am  [name], MSN, RN [facility]  [facility], [facility] RN Care Manager Direct Dial: [phone] Fax: [phone]   Following is a copy of your plan of care:  There are no care plans that you recently modified to display for this patient.

## 2024-10-11 ENCOUNTER — Encounter: Payer: Self-pay | Admitting: Internal Medicine

## 2024-10-12 ENCOUNTER — Other Ambulatory Visit: Payer: Self-pay | Admitting: Internal Medicine

## 2024-10-12 DIAGNOSIS — F331 Major depressive disorder, recurrent, moderate: Secondary | ICD-10-CM

## 2024-10-12 DIAGNOSIS — I6930 Unspecified sequelae of cerebral infarction: Secondary | ICD-10-CM

## 2024-10-14 ENCOUNTER — Ambulatory Visit: Admitting: Physical Therapy

## 2024-10-14 NOTE — Telephone Encounter (Signed)
 Requested Prescriptions  Pending Prescriptions Disp Refills   DULoxetine  (CYMBALTA ) 20 MG capsule [Pharmacy Med Name: DULoxetine  HCl 20 MG Oral Capsule Delayed Release Particles] 180 capsule 0    Sig: Take 2 capsules by mouth once daily     Psychiatry: Antidepressants - SNRI - duloxetine  Passed - 10/14/2024 10:59 AM      Passed - Cr in normal range and within 360 days    Creat  Date Value Ref Range Status  04/02/2024 0.61 0.50 - 0.97 mg/dL Final   Creatinine, Ser  Date Value Ref Range Status  05/20/2024 0.64 0.44 - 1.00 mg/dL Final         Passed - eGFR is 30 or above and within 360 days    EGFR (African American)  Date Value Ref Range Status  09/19/2014 >60 >58mL/min Final   GFR calc Af Amer  Date Value Ref Range Status  05/06/2020 >60 >60 mL/min Final   EGFR (Non-African Amer.)  Date Value Ref Range Status  09/19/2014 >60 >68mL/min Final    Comment:    eGFR values <80mL/min/1.73 m2 may be an indication of chronic kidney disease (CKD). Calculated eGFR, using the MRDR Study equation, is useful in  patients with stable renal function. The eGFR calculation will not be reliable in acutely ill patients when serum creatinine is changing rapidly. It is not useful in patients on dialysis. The eGFR calculation may not be applicable to patients at the low and high extremes of body sizes, pregnant women, and vegetarians.    GFR, Estimated  Date Value Ref Range Status  05/20/2024 >60 >60 mL/min Final    Comment:    (NOTE) Calculated using the CKD-EPI Creatinine Equation (2021)    eGFR  Date Value Ref Range Status  04/02/2024 121 > OR = 60 mL/min/1.13m2 Final         Passed - Completed PHQ-2 or PHQ-9 in the last 360 days      Passed - Last BP in normal range    BP Readings from Last 1 Encounters:  09/15/24 128/74         Passed - Valid encounter within last 6 months    Recent Outpatient Visits           4 weeks ago Bilateral impacted cerumen   [facility] Bernardo Fend, DO   1 month ago Hypertension, unspecified type   [facility] Bernardo Fend, DO   2 months ago Hypertension, unspecified type   [facility] Bernardo Fend, DO   2 months ago Acute pain of right knee   [facility] Bernardo Fend, DO   3 months ago Hypertension, unspecified type   [facility] Bernardo Fend, DO       Future Appointments             In 2 months Bernardo Fend, DO [facility], [place]

## 2024-10-20 DIAGNOSIS — Z7689 Persons encountering health services in other specified circumstances: Secondary | ICD-10-CM | POA: Diagnosis not present

## 2024-10-21 ENCOUNTER — Ambulatory Visit: Admitting: Physical Therapy

## 2024-10-25 ENCOUNTER — Telehealth: Payer: Self-pay | Admitting: Pharmacy Technician

## 2024-10-25 ENCOUNTER — Other Ambulatory Visit: Payer: Self-pay | Admitting: Internal Medicine

## 2024-10-25 ENCOUNTER — Other Ambulatory Visit (HOSPITAL_COMMUNITY): Payer: Self-pay

## 2024-10-25 DIAGNOSIS — I6930 Unspecified sequelae of cerebral infarction: Secondary | ICD-10-CM

## 2024-10-25 NOTE — Telephone Encounter (Signed)
 Pharmacy Patient Advocate Encounter   Received notification from CoverMyMeds that prior authorization for Fluticasone -Salmeterol 100-50MCG/ACT aerosol powder is required/requested.   Insurance verification completed.   The patient is insured through [facility] MEDICAID.   Per test claim:  BRAND NAME ADVAIR is preferred by the insurance.  If suggested medication is appropriate, Please send in a new RX and discontinue this one. If not, please advise as to why it's not appropriate so that we may request a Prior Authorization. Please note, some preferred medications may still require a PA.  If the suggested medications have not been trialed and there are no contraindications to their use, the PA will not be submitted, as it will not be approved.

## 2024-10-26 ENCOUNTER — Other Ambulatory Visit: Payer: Self-pay | Admitting: *Deleted

## 2024-10-26 NOTE — Patient Outreach (Signed)
 Complex Care Management   Visit Note  10/26/2024  Name:  Anna Tapia MRN: [id number] DOB: [birth year]  Situation: Referral received for Complex Care Management related to Mental/Behavioral Health diagnosis depression I obtained verbal consent from Patient.  Visit completed with Patient  on the phone  Background:   Past Medical History:  Diagnosis Date   Arteriovenous malformation of brain    a. s/p remote coiling.   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   Congenital CHF (congestive heart failure) (HCC)    a. 03/2023 Echo: EF 60-65%, no rwma, nl RV fxn, RVSP 29.65mmHg. Mild MR. Mild Ao sclerosis w/o stenosis. Ao root 38mm.   COVID 2022   Hemorrhagic stroke (HCC) 2023   a. L-sided midbrain, thalamic ICH in setting of PCA/ACA aneurysm s/p embolization.   History of broken collarbone 2020   Hydrocephalus (HCC)    a. s/p VP shunt in childhood.   Precordial chest pain    a. 03/2023 MV: No ischemia or infarct.  EF greater than 65%.  No significant coronary calcification.   Sepsis secondary to UTI [facility]) 2021    Assessment: Patient now active with [facility] for peer support and individual therapy. Patient is also actively participates in a support group that support individuals who have had strokes. Patient Reported Symptoms:  Cognitive Cognitive Status: Struggling with memory recall      Neurological Neurological Review of Symptoms: Numbness Neurological Management Strategies: Medication therapy Neurological Comment: numbness in hands continues, discharged from Neurologist in July continues to receive botox shots from Dr. ileana  HEENT HEENT Symptoms Reported: Other: (reports trouble swallowing-reports get choked on her saliva)      Cardiovascular Cardiovascular Symptoms Reported: No symptoms reported    Respiratory Respiratory Symptoms Reported: No symptoms reported    Endocrine Endocrine Symptoms Reported: No symptoms reported Is  patient diabetic?: No    Gastrointestinal Gastrointestinal Symptoms Reported: No symptoms reported Additional Gastrointestinal Details: takes stool softeners daily      Genitourinary Genitourinary Symptoms Reported: No symptoms reported    Integumentary Integumentary Symptoms Reported: No symptoms reported    Musculoskeletal Musculoskelatal Symptoms Reviewed: Limited mobility, Unsteady gait Additional Musculoskeletal Details: hard to stand or walk long distances, uses cane when walking long distances Musculoskeletal Management Strategies: Medication therapy, Routine screening      Psychosocial Psychosocial Symptoms Reported: Depression - if selected complete PHQ 2-9 Additional Psychological Details: continues to attend support, currently on hoiday break will go back the second  monday in January-active with [employer], has therapist and peer support person Behavioral Management Strategies: Coping strategies, Support group Behaviors When Feeling Stressed/Fearful: attends support group and outpatient therapy, music, withdraws Techniques to [employer] with Loss/Stress/Change: Diversional activities, Support group Quality of Family Relationships: involved, supportive Do you feel physically threatened by others?: No    10/26/2024    PHQ2-9 Depression Screening   Little interest or pleasure in doing things Not at all  Feeling down, depressed, or hopeless Not at all  PHQ-2 - Total Score 0  Trouble falling or staying asleep, or sleeping too much    Feeling tired or having little energy    Poor appetite or overeating     Feeling bad about yourself - or that you are a failure or have let yourself or your family down    Trouble concentrating on things, such as reading the newspaper or watching television    Moving or speaking so slowly that other  people could have noticed.  Or the opposite - being so fidgety or restless that you have been moving around a lot more than usual    Thoughts that  you would be better off dead, or hurting yourself in some way    PHQ2-9 Total Score    If you checked off any problems, how difficult have these problems made it for you to do your work, take care of things at home, or get along with other people    Depression Interventions/Treatment      There were no vitals filed for this visit. Pain Scale: 0-10 Pain Score: 0-No pain  Medications Reviewed Today     Reviewed by Ermalinda Lenn HERO, LCSW (Social Worker) on 10/26/24 at 1008  Med List Status: <None>   Medication Order Taking? Sig Documenting Provider Last Dose Status Informant  albuterol  (VENTOLIN  HFA) 108 (90 Base) MCG/ACT inhaler [id number] Yes Inhale 2 puffs into the lungs every 6 (six) hours as needed for wheezing or shortness of breath. Bernardo Fend, DO  Active   baclofen  (LIORESAL ) 10 MG tablet [id number] Yes TAKE ONE-HALF TABLET BY MOUTH IN THE MORNING, ONE TABLET AT LUNCH, AND 2 TABLETS AT ADA Bernardo Fend, DO  Active   DULoxetine  (CYMBALTA ) 20 MG capsule [id number] Yes Take 2 capsules by mouth once daily Bernardo Fend, DO  Active   olmesartan  (BENICAR ) 40 MG tablet [id number] Yes Take 1 tablet (40 mg total) by mouth daily. Bernardo Fend, DO  Active   pregabalin (LYRICA) 50 MG capsule [id number] Yes Take 50 mg by mouth 2 (two) times daily. [provider]  Active             Recommendation:   PCP Follow-up Specialty provider follow-up as scheduled Ongoing mental health counseling with [facility]  Follow Up Plan:   Closing From:  VBCI social work  [employer], [employer] [place]  [employer], [employer] [facility] Licensed Clinical Social Worker  Direct Dial: [phone]

## 2024-10-26 NOTE — Patient Instructions (Signed)
 Visit Information  Thank you for taking time to visit with me today. Please don't hesitate to contact me if I can be of assistance to you before our next scheduled appointment.  Your next care management appointment is no further scheduled appointments.   Closing From: VBCI social work-goals met  Please call the care guide team at [phone] if you need to cancel, schedule, or reschedule an appointment.   Please call the Suicide and Crisis Lifeline: 988 call the [place]  National Suicide Prevention Lifeline: (364) 822-3090 or TTY: 408-074-9403 TTY (920) 587-6813) to talk to a trained counselor call 1-800-273-TALK (toll free, 24 hour hotline) call 911 if you are experiencing a Mental Health or Behavioral Health Crisis or need someone to talk to.  Cosette Prindle, LCSW [place]  [facility], [facility] Licensed Clinical Social Worker  Direct Dial: [phone]

## 2024-10-26 NOTE — Addendum Note (Signed)
 Addended by: RENTERIA-GARCIA, Leauna Sharber on: 10/26/2024 08:31 AM   Modules accepted: Orders

## 2024-10-27 NOTE — Telephone Encounter (Signed)
 Requested Prescriptions  Pending Prescriptions Disp Refills   baclofen  (LIORESAL ) 10 MG tablet [Pharmacy Med Name: Baclofen  10 MG Oral Tablet] 180 tablet 0    Sig: TAKE ONE-HALF TABLET BY MOUTH IN THE MORNING, ONE TABLET AT LUNCH, AND TWO TABLETS AT NIGHT.     Analgesics:  Muscle Relaxants - baclofen  Passed - 10/27/2024  1:00 PM      Passed - Cr in normal range and within 180 days    Creat  Date Value Ref Range Status  04/02/2024 0.61 0.50 - 0.97 mg/dL Final   Creatinine, Ser  Date Value Ref Range Status  05/20/2024 0.64 0.44 - 1.00 mg/dL Final         Passed - eGFR is 30 or above and within 180 days    EGFR (African American)  Date Value Ref Range Status  09/19/2014 >60 >3mL/min Final   GFR calc Af Amer  Date Value Ref Range Status  05/06/2020 >60 >60 mL/min Final   EGFR (Non-African Amer.)  Date Value Ref Range Status  09/19/2014 >60 >22mL/min Final    Comment:    eGFR values <24mL/min/1.73 m2 may be an indication of chronic kidney disease (CKD). Calculated eGFR, using the MRDR Study equation, is useful in  patients with stable renal function. The eGFR calculation will not be reliable in acutely ill patients when serum creatinine is changing rapidly. It is not useful in patients on dialysis. The eGFR calculation may not be applicable to patients at the low and high extremes of body sizes, pregnant women, and vegetarians.    GFR, Estimated  Date Value Ref Range Status  05/20/2024 >60 >60 mL/min Final    Comment:    (NOTE) Calculated using the CKD-EPI Creatinine Equation (2021)    eGFR  Date Value Ref Range Status  04/02/2024 121 > OR = 60 mL/min/1.81m2 Final         Passed - Valid encounter within last 6 months    Recent Outpatient Visits           1 month ago Bilateral impacted cerumen   [facility] Bernardo Fend, DO   2 months ago Hypertension, unspecified type   [facility] Bernardo Fend,  DO   2 months ago Hypertension, unspecified type   [facility] Bernardo Fend, DO   3 months ago Acute pain of right knee   [facility] Bernardo Fend, DO   4 months ago Hypertension, unspecified type   [facility] Bernardo Fend, DO       Future Appointments             In 1 month Bernardo Fend, DO [facility], [place]

## 2024-11-01 ENCOUNTER — Other Ambulatory Visit: Payer: Self-pay

## 2024-11-01 NOTE — Patient Outreach (Signed)
 Complex Care Management   Visit Note  11/01/2024  Name:  Anna Tapia MRN: [id number] DOB: 06/11/1991  Situation: Referral received for Complex Care Management related to Heart Failure and HTN I obtained verbal consent from Patient.  Visit completed with Patient  on the phone  Background:   Past Medical History:  Diagnosis Date   Arteriovenous malformation of brain    a. s/p remote coiling.   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   Congenital CHF (congestive heart failure) (HCC)    a. 03/2023 Echo: EF 60-65%, no rwma, nl RV fxn, RVSP 29.7mmHg. Mild MR. Mild Ao sclerosis w/o stenosis. Ao root 38mm.   COVID 2022   Hemorrhagic stroke (HCC) 2023   a. L-sided midbrain, thalamic ICH in setting of PCA/ACA aneurysm s/p embolization.   History of broken collarbone 2020   Hydrocephalus (HCC)    a. s/p VP shunt in childhood.   Precordial chest pain    a. 03/2023 MV: No ischemia or infarct.  EF greater than 65%.  No significant coronary calcification.   Sepsis secondary to UTI [facility]) 2021    Assessment: Patient Reported Symptoms:  Cognitive Cognitive Status: Struggling with memory recall      Neurological Neurological Review of Symptoms: Numbness Neurological Comment: numbness in hands and face, next botox injection in March, exploring Robotic glose she saw on Anna Tapia website to assist with contracted right hand - working with PCP office and Anna Tapia for PA  HEENT   HEENT Comment: notice over past couple Anna Tapia increased choking on saliva and at times when eating, will discuss with PCP - aware to call if worsens    Cardiovascular Cardiovascular Symptoms Reported: No symptoms reported Cardiovascular Management Strategies: Medication therapy, Routine screening, Diet modification Cardiovascular Comment: reports checking BP a couple times a week - normal encouraged to record and bring to PCP visit, usually 120's/mid 80's, denies sx of HF   Respiratory Respiratory Symptoms Reported: No symptoms reported Additional Respiratory Details: will contact Anna Tapia for Asthma benefits Respiratory Management Strategies: Routine screening  Endocrine Endocrine Symptoms Reported: Not assessed Is patient diabetic?: No    Gastrointestinal Gastrointestinal Symptoms Reported: No symptoms reported      Genitourinary Genitourinary Symptoms Reported: No symptoms reported    Integumentary Integumentary Symptoms Reported: No symptoms reported    Musculoskeletal Musculoskelatal Symptoms Reviewed: Limited mobility, Unsteady gait, Other Other Musculoskeletal Symptoms: hand contracted wants robotic glove seen on [email or web address] - working with PCP office for PA, states some swelling in right lower leg despite compression hose, does elevate with improvement, no falls, cane prn   Falls in the past year?: No Number of falls in past year: 1 or less Was there an injury with Fall?: No Fall Risk Category Calculator: 0 Patient Fall Risk Level: Low Fall Risk Patient at Risk for Falls Due to: History of fall(s), Impaired balance/gait, Impaired mobility Fall risk Follow up: Falls evaluation completed  Psychosocial Psychosocial Symptoms Reported: Depression - if selected complete PHQ 2-9 Additional Psychological Details: support group, peer support person and therapy with Anna Tapia - reports helpful, looking forward to desiability decision, states being scheudled to see physician for eval Behavioral Management Strategies: Coping strategies, Support group Major Change/Loss/Stressor/Fears (CP): Medical condition, self Techniques to Cope with Loss/Stress/Change: Diversional activities, Support group, Counseling      11/01/2024    PHQ2-9 Depression Screening   Little interest or pleasure in doing things Not at all  Feeling down,  depressed, or hopeless Not at all  PHQ-2 - Total Score 0  Trouble falling or staying asleep, or sleeping too much    Feeling  tired or having little energy    Poor appetite or overeating     Feeling bad about yourself - or that you are a failure or have let yourself or your family down    Trouble concentrating on things, such as reading the newspaper or watching television    Moving or speaking so slowly that other people could have noticed.  Or the opposite - being so fidgety or restless that you have been moving around a lot more than usual    Thoughts that you would be better off dead, or hurting yourself in some way    PHQ2-9 Total Score    If you checked off any problems, how difficult have these problems made it for you to do your work, take care of things at home, or get along with other people    Depression Interventions/Treatment      There were no vitals filed for this visit.    Medications Reviewed Today     Reviewed by Anna Lands, RN (Registered Nurse) on 11/01/24 at [phone]  Med List Status: <None>   Medication Order Taking? Sig Documenting Provider Last Dose Status Informant  albuterol  (VENTOLIN  HFA) 108 (90 Base) MCG/ACT inhaler [id number] Yes Inhale 2 puffs into the lungs every 6 (six) hours as needed for wheezing or shortness of breath. Anna Fend, DO  Active   baclofen  (LIORESAL ) 10 MG tablet [id number] Yes TAKE ONE-HALF TABLET BY MOUTH IN THE MORNING, ONE TABLET AT LUNCH, AND TWO TABLETS AT NIGHT. Anna Fend, DO  Active   DULoxetine  (CYMBALTA ) 20 MG capsule [id number] Yes Take 2 capsules by mouth once daily Anna Fend, DO  Active   olmesartan  (BENICAR ) 40 MG tablet [id number] Yes Take 1 tablet (40 mg total) by mouth daily. Anna Fend, DO  Active   pregabalin (LYRICA) 50 MG capsule [id number] Yes Take 50 mg by mouth 2 (two) times daily. [provider]  Active             Recommendation:   PCP Follow-up Continue Current Plan of Care  Follow Up Plan:   Telephone follow-up 5 Anna Tapia as discussed  Tapia Devra, MSN, RN [facility]  [facility], [facility] RN Care Manager Direct Dial: [phone] Fax: [phone]

## 2024-11-01 NOTE — Patient Instructions (Addendum)
 Visit Information  Ms. Anna Tapia was given information about Medicaid Managed Care team care coordination services as a part of their [facility] Medicaid benefit.   If you would like to schedule transportation through your [facility] plan, please call the following number at least 2 days in advance of your appointment: [phone].   You can also use the MTM portal or MTM mobile app to manage your rides. Reimbursement for transportation is available through [facility]! For the portal, please go to [email or web address]  Call the [facility] Crisis Line at [phone], at any time, 24 hours a day, 7 days a week. If you are in danger or need immediate medical attention call 911.   CALL [facility] TO EXPLORE BENEFITS RELATED TO ASTHMA AS DISCUSSED DURING VISIT  Please see education materials related to NONE provided by MyChart link.  Patient verbalizes understanding of instructions and care plan provided today and agrees to view in MyChart. Active MyChart status and patient understanding of how to access instructions and care plan via MyChart confirmed with patient.     Telephone follow up appointment with Managed Medicaid care management team member scheduled for: 5 Anna Tapia as discussed - 12/06/2024 at 9:30 am  Anna Duos, MSN, RN [place]  [facility], [facility] RN Care Manager Direct Dial: [phone] Fax: [phone]   Following is a copy of your plan of care:   Goals Addressed             This Visit's Progress    VBCI RN Care Plan - CHF   On track    Problems:  Chronic Disease Management support and education needs related to CHF  Goal: Over the next 90 days the Patient will attend all scheduled medical appointments: Patient will attend all appointments as evidenced by chart review        continue to work with RN Care Manager and/or Social Worker to address care management and care coordination needs related to CHF as evidenced by adherence to  care management team scheduled appointments     take all medications exactly as prescribed and will call provider for medication related questions as evidenced by Patient reporting compliance with all medications    verbalize basic understanding of CHF disease process and self health management plan as evidenced by BP and weight checks daily, record and bring to PCP appointments, continue: exercise, low salt cardiac diet, read and follow CHF Action Plan regarding when to call provider and red flags for ED  Interventions:   Heart Failure Interventions: Basic overview and discussion of pathophysiology of Heart Failure reviewed Provided education on low sodium diet Reviewed Heart Failure Action Plan in depth and provided written copy - no sx reported Advised patient to weigh each morning after emptying bladder - does not check weight regularly, discussed importance - weighing several times a week, no sx heart failure Discussed importance of daily weight and advised patient to weigh and record daily Reviewed role of diuretics in prevention of fluid overload and management of heart failure; Discussed the importance of keeping all appointments with provider Provided patient with education about the role of exercise in the management of heart failure Screening for signs and symptoms of depression related to chronic disease state Improved mood, attending peer support group and should be scheduled with therapist next week Assessed social determinant of health barriers Stable - food stamps replenished and using food panties prn, awaiting disability decision - reports awaiting schedule date for disability physician appointment Mailing education - did not  check MyChart, agreed to review prior to next appointment Reports received info, no questions, HF AP reviewed - denies any symptoms at this time. BP controlled.  Reminded to record BP and weight to share with provider next appointment to help with medication  adjustments as needed, connecting BP to over all health and risk for stroke Patient Self-Care Activities:  Attend all scheduled provider appointments Call pharmacy for medication refills 3-7 days in advance of running out of medications Call provider office for new concerns or questions  Take medications as prescribed   Work with the social worker to address care coordination needs and will continue to work with the clinical team to address health care and disease management related needs  Plan:  Telephone follow up appointment with care management team member scheduled for:  12/06/2024 at 9:30 am          VBCI RN Care Plan - HTN   On track    Problems:  Chronic Disease Management support and education needs related to HTN  Goal: Over the next 90 days the Patient will attend all scheduled medical appointments: Patient will attend all appointments as evidenced by chart review        continue to work with RN Care Manager and/or Social Worker to address care management and care coordination needs related to HTN as evidenced by adherence to care management team scheduled appointments     take all medications exactly as prescribed and will call provider for medication related questions as evidenced by patient reporting compliance with all medications    verbalize basic understanding of HTN disease process and self health management plan as evidenced by checking and recording BP daily, reporting concerns to provider and recognizing red flags for ED, continue low salt cardiac diet, exercise + Attend Stroke Support Group  - attending  +Attend Appointment with LCSW for depression management 07/21/24 at 11:00 am -  fu 08/25/24, to get appointment with [name], attending Stroke Support Group - has peer support person and seeing therapist at [facility] + Reapply for disability - RNCM notified BSW assist requested, has appointment for application   Interventions:   Hypertension  Interventions: Last practice recorded BP readings:  BP Readings from Last 3 Encounters:  09/15/24 128/74  08/17/24 124/78  08/16/24 122/84   Most recent eGFR/CrCl:  Lab Results  Component Value Date   EGFR 121 04/02/2024    No components found for: CRCL  Provided education to patient re: stroke prevention, s/s of heart attack and stroke Counseled on the importance of exercise goals with target of 150 minutes per week Advised patient, providing education and rationale, to monitor blood pressure daily and record, calling PCP for findings outside established parameters - reports BP in 120s/80's, discussed to check daily and record to show provider to allow for adjustments in medication as needed Provided education on prescribed diet low salt/ cardiac  Discussed complications of poorly controlled blood pressure such as heart disease, stroke, circulatory complications, vision complications, kidney impairment, sexual dysfunction Screening for signs and symptoms of depression related to chronic disease state  Assessed social determinant of health barriers Patient now on Olmesartan  40 mg with improved BP control, aware to report repeat readings over 130/90 to provider. Reports checking BP daily and recording, no elevations. Tolerating Olmesartan  no side effects.  Completed eval with [name] and awaiting scheduling for therapist and peer support. Plans to attend Stroke Support Group Monday at [facility]. Awaiting disability Determination - told can take  up to 6 mos. Scheduled with BSW to discuss housing options for future. Awaiting disability determination, attending peer support group and improved mood Reports DBP in mid 80's advised to continue monitoring BP and weight,  record and share with PCP next visit  Patient Self-Care Activities:  Attend all scheduled provider appointments Call pharmacy for medication refills 3-7 days in advance of running out of medications Take  medications as prescribed   Work with the social worker to address care coordination needs and will continue to work with the clinical team to address health care and disease management related needs Attend stroke support group Attend LCSW appointment for depression management - completed and connected with therapist at [facility]  Plan:  Telephone follow up appointment with care management team member scheduled for:  12/06/2024 at 9:30 am am

## 2024-11-10 ENCOUNTER — Encounter: Payer: Self-pay | Admitting: Internal Medicine

## 2024-11-11 ENCOUNTER — Other Ambulatory Visit: Payer: Self-pay | Admitting: Internal Medicine

## 2024-11-11 DIAGNOSIS — I6930 Unspecified sequelae of cerebral infarction: Secondary | ICD-10-CM

## 2024-11-22 NOTE — Therapy (Unsigned)
 " OUTPATIENT PHYSICAL THERAPY NEURO EVALUATION   Patient Name: Anna Tapia MRN: [id number] DOB:06-10-1991, 34 y.o., female Today's Date: 11/24/2024   PCP: Bernardo Fend, DO  REFERRING PROVIDER: Bernardo Fend, DO   END OF SESSION:  PT End of Session - 11/23/24 1558     Visit Number 1    Number of Visits 12    Date for Recertification  02/15/25    Progress Note Due on Visit 10    PT Start Time 1615    PT Stop Time 1656    PT Time Calculation (min) 41 min    Equipment Utilized During Treatment Gait belt    Activity Tolerance Patient tolerated treatment well;No increased pain          Past Medical History:  Diagnosis Date   Arteriovenous malformation of brain    a. s/p remote coiling.   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   Congenital CHF (congestive heart failure) (HCC)    a. 03/2023 Echo: EF 60-65%, no rwma, nl RV fxn, RVSP 29.31mmHg. Mild MR. Mild Ao sclerosis w/o stenosis. Ao root 38mm.   COVID 2022   Hemorrhagic stroke (HCC) 2023   a. L-sided midbrain, thalamic ICH in setting of PCA/ACA aneurysm s/p embolization.   History of broken collarbone 2020   Hydrocephalus (HCC)    a. s/p VP shunt in childhood.   Precordial chest pain    a. 03/2023 MV: No ischemia or infarct.  EF greater than 65%.  No significant coronary calcification.   Sepsis secondary to UTI (HCC) 2021   Past Surgical History:  Procedure Laterality Date   VENTRICULOPERITONEAL SHUNT     Patient Active Problem List   Diagnosis Date Noted   History of CVA with residual deficit 11/25/2023   CHF (congestive heart failure) (HCC) 03/25/2023   Left-sided nontraumatic intracerebral hemorrhage (HCC) 08/01/2022   AVM (arteriovenous malformation) brain 07/24/2022   Asthma 07/24/2022   Major depressive disorder, recurrent episode, moderate (HCC) 02/07/2022   Sepsis secondary to UTI (HCC) 05/01/2020   VP (ventriculoperitoneal) shunt status 05/01/2020    Right ovarian cyst 05/01/2020   Elevated LFTs 05/01/2020    ONSET DATE: 07/29/22  REFERRING DIAG: I69.30 (ICD-10-CM) - History of CVA with residual deficit   THERAPY DIAG:  Muscle weakness (generalized)  Difficulty in walking, not elsewhere classified  Unsteadiness on feet  Other abnormalities of gait and mobility  History of CVA with residual deficit  Rationale for Evaluation and Treatment: Rehabilitation  SUBJECTIVE:  SUBJECTIVE STATEMENT: Pt having trouble with endurance. Pt still gets tired when she is walking. Feels like just a few minutes ( maybe 5) and she starts getting tired. Pt also notes hip pain that starts at around the same interval, perhaps a little longer. She reports when the hip hurts it hurts pretty bad.  Pt accompanied by: self  PERTINENT HISTORY:  From recent MD visit:  Anna Tapia presents to follow up on chronic medical conditions. Having some knee pain today, had been doing well with PT but her insurance is no longer paying for this until the new year.    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  Also on Lyrica 50 mg BID  -Difficulty using using right hand and right side of face - completed home PT/OT but interested in continuing with therapy outside the home.  Hx of CHF as a newborn: -Had been following with Cardiology at [facility], last seen in 2018 -Last echo 9/23 EF 58% -Denies chest pain, palpitations, shortness of breath or  lower extremity swelling  PAIN:  Are you having pain? Yes: NPRS scale: 2/10 resting - 5/10 with activity  Pain location: R hip post region along gluteal musculature  Pain description: dull pain  Aggravating factors: moving arm  Relieving factors: n/a   PRECAUTIONS: Fall  RED FLAGS: None   WEIGHT BEARING RESTRICTIONS: No  FALLS: Has patient fallen in last 6 months? No  LIVING ENVIRONMENT: Lives with: lives with their family Lives in: House/apartment Stairs: Yes: External: 2 steps; on right going up and on left going up Has following equipment at home: Single point cane  PLOF: Independent, Independent with basic ADLs, and prior to CVA   PATIENT GOALS: Improve endurance, improve hip strength and consequently reduce pain   OBJECTIVE:  Note: Objective measures were completed at Evaluation unless otherwise noted.  Diagnostic findings:  CT 2023 IMPRESSION: 1. 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 Again, prominent arterial contribution to the AVM is seen from the left ACA and posterior circulation, with primary venous drainage into the deep venous system. Associated 7 mm aneurysm along the right anterolateral aspect of the AVM as above. 2. Diffuse tortuosity and ectasia elsewhere about the major arterial vasculature of the head and neck. No large vessel occlusion or hemodynamically significant stenosis. No other acute vascular abnormality.   COGNITION: Overall cognitive status: Within functional limits for tasks assessed   SENSATION: Not tested   POSTURE: weight shift right  LOWER EXTREMITY ROM:     WNL for tasks assessed  LOWER EXTREMITY MMT:    *MEASURE ON PLINTH AGAINST GRAVITY* MMT Right Eval Left Eval  Hip flexion 5 4*  Hip extension    Hip abduction 5 4*  Hip adduction 5 4*  Hip internal rotation    Hip external rotation    Knee flexion 5 4+  Knee extension 5 4+  Ankle dorsiflexion 5 4  Ankle  plantarflexion 5 4+  Ankle inversion    Ankle eversion    (Blank rows = not tested) *MEASURE ON PLINTH AGAINST GRAVITY*  BED MOBILITY:  WNL  TRANSFERS: WNL  RAMP:  Not tested  CURB:  Not Tested  STAIRS: Not tested GAIT: Findings: Gait Characteristics: decreased arm swing- Left, decreased step length- Right, and trendelenburg, Distance walked: 1050 ft, and Comments: Pain onset 3 min in Left Hip, fatigue onset and visibly slower gait at 5 min  FUNCTIONAL TESTS:  30 seconds chair stand test 11 6 minute walk test: 1050 ft, pain in hip onset at 3 min and visible fatigue and slowed pace at 5 min mark.  : .82 m/s  PATIENT SURVEYS:  LEFS    Survey date:    1: Any of your usual work, housework or school activities 3  2. Usual hobbies, recreational or sporting activities 4  3. Getting into/out of the bath 4  4. Walking between rooms 3  5. Putting on socks/shoes 2  6. Squatting  4  7. Lifting an object, like a bag of groceries from the floor 4  8. Performing light activities around your home 4  9. Performing heavy activities around your home 1  10. Getting into/out of a car 3  11. Walking 2 blocks 2  12. Walking 1 mile 1  13. Going up/down 10 stairs (1 flight) 4  14. Standing for 1 hour 3  15.  sitting for 1 hour 4  16. Running on even ground 2  17. Running on uneven ground 2  18. Making sharp turns while running fast 2  19. Hopping  4  20. Rolling over in bed 3  Score total:  59                                                                                                                                TREATMENT DATE: 11/24/24   SELF CARE Patient instructed in plan of care, findings for evaluation, and ways of physical therapy may improve their function and quality of life.   TE- To improve strength, endurance, mobility, and function of specific targeted muscle groups or improve joint range of motion or improve muscle flexibility Went over the following and  completed as prescribed. Significant weakness and compensation with SL hip ABD   Sidelying Hip Abduction  - 1 x daily - 7 x weekly - 2 sets - 10 reps - Supine Bridge with Resistance Band  - 1 x daily - 7 x weekly - 3 sets - 10 reps  PATIENT EDUCATION: Education details: POC Person educated: Patient Education method: Explanation Education comprehension: verbalized understanding   HOME EXERCISE PROGRAM: Access Code: [email or web address] URL: [email or web address] Date: 11/23/2024 Prepared by: Lonni Gainer  Exercises - Sidelying Hip Abduction  - 1 x daily - 7 x weekly - 2 sets - 10 reps - Supine Bridge with Resistance Band  - 1 x daily - 7 x weekly - 3 sets - 10 reps   GOALS: Goals reviewed with patient? Yes  SHORT TERM GOALS: Target date: 12/21/2024       Patient will be  independent in home exercise program to improve strength/mobility for better functional independence with ADLs. Baseline: No HEP currently  Goal status: INITIAL   LONG TERM GOALS: Target date: 01/18/2025    1.  Patient will complete 13 or more stands with 30 sec chair stand test indicating an increased LE strength and improved balance. Baseline: 11 Goal status: INITIAL  2.  Patient will improve LEFS score to 69   to demonstrate statistically significant improvement in mobility and quality of life as it relates to their LE functional capacity.  Baseline: 59 Goal status: INITIAL   3. The patient will ambulate continuously for six minutes without onset of hip pain in order to improve functional walking tolerance and allow safer, sustained participation in household and community mobility tasks. Baseline: 3 min onset and subsequent worsening of pain during Goal status: INITIAL   4.  The patient will demonstrate improved functional hip abduction strength on the involved side, achieving at least four out of five strength on manual muscle testing without compensatory trunk lean, in order to  enhance pelvic stability, reduce gait asymmetry, and support safer, more efficient community ambulation. Baseline: 4/5 measured in SL against gravity Goal status: INITIAL  5.   The patient will demonstrate improved gait speed to at least one 1.1 meters per second during overground ambulation, indicating enhanced gait efficiency, improved motor control, and increased safety for community mobility. Baseline: .62m/s Goal status: INITIAL  6.   Patient will increase six minute walk test distance to >1200 ft without onset of hip pain for progression to demonstration of improved functional endurance and improve gait ability Baseline: 1050 - fatigue at 5 min and slowed pace and L hip pain at 3 min with increasing pain throughout Goal status: INITIAL     ASSESSMENT:  CLINICAL IMPRESSION:  The patient is a 34 year old female presenting to physical therapy with persistent deficits following a cerebrovascular accident. She ambulates independently but continues to demonstrate marked hemiparesis on the involved side. This neuromuscular impairment limits her motor control, reduces strength and coordination, and contributes to inefficient and compensatory gait patterns. As a result, the patient exhibits decreased gait quality, below-average gait speed, and hip pain with prolonged gait and ambulation. Objective balance measures performed today indicate an increased risk of falls but more of a concern with endurance with functional activities.  Physical therapy is imperative for this patient because the deficits she presents with place her at ongoing risk for functional decline and preventable injury. Without targeted intervention, her impaired gait mechanics, hip pain with prolonged gait, and unilateral weakness may lead to increased fall risk, reduced participation in daily activities, and progressive limitations in community mobility. Evidence consistently supports that early and ongoing rehabilitation after  a cerebrovascular accident is essential to maximize neuroplasticity and recovery of functional movement. Skilled physical therapy will provide structured, repetitive, task-specific training necessary to improve motor control on the involved side, enhance gait symmetry, and restore safer movement patterns.  Through individualized therapeutic exercise, neuromuscular reeducation, balance retraining, and gait training, physical therapy will help the patient improve her safety with daily activities, increase her confidence in community environments, and expand her tolerance for household and work-related tasks. In addition, improving gait efficiency and endurance will support greater independence and contribute to a higher overall quality of life from both a mobility and functional standpoint. Physical therapy intervention is therefore essential in mitigating fall risk, preventing secondary complications, and promoting long-term recovery after stroke.   OBJECTIVE IMPAIRMENTS: Abnormal gait, cardiopulmonary status  limiting activity, decreased activity tolerance, decreased balance, decreased coordination, decreased endurance, decreased knowledge of use of DME, decreased mobility, difficulty walking, decreased ROM, decreased strength, hypomobility, increased edema, increased fascial restrictions, increased muscle spasms, impaired flexibility, impaired sensation, impaired tone, impaired UE functional use, improper body mechanics, postural dysfunction, and pain.   ACTIVITY LIMITATIONS: carrying, lifting, bending, squatting, stairs, transfers, bed mobility, dressing, and locomotion level  PARTICIPATION LIMITATIONS: cleaning, laundry, interpersonal relationship, driving, shopping, community activity, occupation, and yard work  PERSONAL FACTORS: 1-2 comorbidities: hs of CHF and CVA are also affecting patient's functional outcome.   REHAB POTENTIAL: Good  CLINICAL DECISION MAKING: Evolving/moderate  complexity  EVALUATION COMPLEXITY: Moderate  PLAN:  PT FREQUENCY: 1x/week  PT DURATION: 12 weeks  PLANNED INTERVENTIONS: 97750- Physical Performance Testing, 97110-Therapeutic exercises, 97530- Therapeutic activity, 97112- Neuromuscular re-education, 97535- Self Care, 02859- Manual therapy, [phone]- Gait training, [phone]- Canalith repositioning, Patient/Family education, Balance training, Stair training, and Vestibular training  PLAN FOR NEXT SESSION: Progress hip strength, introduce gym based endurance activities to improve functional capacity and educate regarding safe responses to endurance based exercise activities.    Lonni KATHEE Gainer, PT 11/24/2024, 7:54 AM        "

## 2024-11-23 ENCOUNTER — Ambulatory Visit: Attending: Internal Medicine | Admitting: Physical Therapy

## 2024-11-23 ENCOUNTER — Ambulatory Visit

## 2024-11-23 DIAGNOSIS — M25552 Pain in left hip: Secondary | ICD-10-CM | POA: Diagnosis present

## 2024-11-23 DIAGNOSIS — R2681 Unsteadiness on feet: Secondary | ICD-10-CM | POA: Diagnosis present

## 2024-11-23 DIAGNOSIS — M6281 Muscle weakness (generalized): Secondary | ICD-10-CM | POA: Diagnosis present

## 2024-11-23 DIAGNOSIS — I6930 Unspecified sequelae of cerebral infarction: Secondary | ICD-10-CM | POA: Diagnosis present

## 2024-11-23 DIAGNOSIS — R2689 Other abnormalities of gait and mobility: Secondary | ICD-10-CM | POA: Diagnosis present

## 2024-11-23 DIAGNOSIS — R278 Other lack of coordination: Secondary | ICD-10-CM | POA: Diagnosis present

## 2024-11-23 DIAGNOSIS — R262 Difficulty in walking, not elsewhere classified: Secondary | ICD-10-CM | POA: Insufficient documentation

## 2024-11-30 NOTE — Telephone Encounter (Unsigned)
 Copied from CRM #[id number]. Topic: General - Other >> Nov 29, 2024  1:59 PM Zebedee SAUNDERS wrote: Reason for CRM: Pt called needs Letter sent to [facility] Cast Worker fax: [phone] Regarding for food stamps re-certification. Case Id: [id number]

## 2024-12-01 ENCOUNTER — Other Ambulatory Visit: Payer: Self-pay | Admitting: Internal Medicine

## 2024-12-01 ENCOUNTER — Ambulatory Visit

## 2024-12-01 DIAGNOSIS — I6930 Unspecified sequelae of cerebral infarction: Secondary | ICD-10-CM

## 2024-12-01 DIAGNOSIS — M25552 Pain in left hip: Secondary | ICD-10-CM

## 2024-12-01 DIAGNOSIS — M6281 Muscle weakness (generalized): Secondary | ICD-10-CM | POA: Diagnosis not present

## 2024-12-01 DIAGNOSIS — R262 Difficulty in walking, not elsewhere classified: Secondary | ICD-10-CM

## 2024-12-01 DIAGNOSIS — R278 Other lack of coordination: Secondary | ICD-10-CM

## 2024-12-01 DIAGNOSIS — R2681 Unsteadiness on feet: Secondary | ICD-10-CM

## 2024-12-01 DIAGNOSIS — R2689 Other abnormalities of gait and mobility: Secondary | ICD-10-CM

## 2024-12-01 NOTE — Therapy (Signed)
 " OUTPATIENT PHYSICAL THERAPY NEURO TREATMENTv   Patient Name: Anna Tapia MRN: [id number] DOB:June 02, 1991, 34 y.o., female Today's Date: 12/01/2024   PCP: Bernardo Fend, DO  REFERRING PROVIDER: Bernardo Fend, DO   END OF SESSION:  PT End of Session - 12/01/24 0832     Visit Number 2    Number of Visits 12    Date for Recertification  02/15/25    Authorization Type [employer]    Authorization Time Period 1/28-2/26/2026    Authorization - Visit Number 1    Authorization - Number of Visits 4    Progress Note Due on Visit 10    PT Start Time 0800    PT Stop Time 0844    PT Time Calculation (min) 44 min    Equipment Utilized During Treatment Gait belt    Activity Tolerance Patient tolerated treatment well;No increased pain    Behavior During Therapy WFL for tasks assessed/performed           Past Medical History:  Diagnosis Date   Arteriovenous malformation of brain    a. s/p remote coiling.   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   Congenital CHF (congestive heart failure) (HCC)    a. 03/2023 Echo: EF 60-65%, no rwma, nl RV fxn, RVSP 29.56mmHg. Mild MR. Mild Ao sclerosis w/o stenosis. Ao root 38mm.   COVID 2022   Hemorrhagic stroke (HCC) 2023   a. L-sided midbrain, thalamic ICH in setting of PCA/ACA aneurysm s/p embolization.   History of broken collarbone 2020   Hydrocephalus (HCC)    a. s/p VP shunt in childhood.   Precordial chest pain    a. 03/2023 MV: No ischemia or infarct.  EF greater than 65%.  No significant coronary calcification.   Sepsis secondary to UTI (HCC) 2021   Past Surgical History:  Procedure Laterality Date   VENTRICULOPERITONEAL SHUNT     Patient Active Problem List   Diagnosis Date Noted   History of CVA with residual deficit 11/25/2023   CHF (congestive heart failure) (HCC) 03/25/2023   Left-sided nontraumatic intracerebral hemorrhage (HCC) 08/01/2022   AVM (arteriovenous malformation)  brain 07/24/2022   Asthma 07/24/2022   Major depressive disorder, recurrent episode, moderate (HCC) 02/07/2022   Sepsis secondary to UTI (HCC) 05/01/2020   VP (ventriculoperitoneal) shunt status 05/01/2020   Right ovarian cyst 05/01/2020   Elevated LFTs 05/01/2020    ONSET DATE: 07/29/22  REFERRING DIAG: I69.30 (ICD-10-CM) - History of CVA with residual deficit   THERAPY DIAG:  Muscle weakness (generalized)  Difficulty in walking, not elsewhere classified  Unsteadiness on feet  Other abnormalities of gait and mobility  History of CVA with residual deficit  Pain in left hip  Balance disorder  Other lack of coordination  Rationale for Evaluation and Treatment: Rehabilitation  SUBJECTIVE:  SUBJECTIVE STATEMENT: Patient reports her primary goal is to work on her hip and get her leg strong. Reports doing okay so far today.   Pt accompanied by: self  PERTINENT HISTORY:  From recent MD visit:  [name] presents to follow up on chronic medical conditions. Having some knee pain today, had been doing well with PT but her insurance is no longer paying for this until the new year.    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  Also on Lyrica 50 mg BID  -Difficulty using using right hand and right side of face - completed home PT/OT but interested in continuing with therapy outside the home.  Hx of CHF as a newborn: -Had been following with  Cardiology at [facility], last seen in 2018 -Last echo 9/23 EF 58% -Denies chest pain, palpitations, shortness of breath or lower extremity swelling  PAIN:  Are you having pain? Yes: NPRS scale: 2/10 resting - 5/10 with activity  Pain location: R hip post region along gluteal musculature  Pain description: dull pain  Aggravating factors: moving arm  Relieving factors: n/a   PRECAUTIONS: Fall  RED FLAGS: None   WEIGHT BEARING RESTRICTIONS: No  FALLS: Has patient fallen in last 6 months? No  LIVING ENVIRONMENT: Lives with: lives with their family Lives in: House/apartment Stairs: Yes: External: 2 steps; on right going up and on left going up Has following equipment at home: Single point cane  PLOF: Independent, Independent with basic ADLs, and prior to CVA   PATIENT GOALS: Improve endurance, improve hip strength and consequently reduce pain   OBJECTIVE:  Note: Objective measures were completed at Evaluation unless otherwise noted.  Diagnostic findings:  CT 2023 IMPRESSION: 1. 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 Again, prominent arterial contribution to the AVM is seen from the left ACA and posterior circulation, with primary venous drainage into the deep venous system. Associated 7 mm aneurysm along the right anterolateral aspect of the AVM as above. 2. Diffuse tortuosity and ectasia elsewhere about the major arterial vasculature of the head and neck. No large vessel occlusion or hemodynamically significant stenosis. No other acute vascular abnormality.   COGNITION: Overall cognitive status: Within functional limits for tasks assessed   SENSATION: Not tested   POSTURE: weight shift right  LOWER EXTREMITY ROM:     WNL for tasks assessed  LOWER EXTREMITY MMT:    *MEASURE ON PLINTH AGAINST GRAVITY* MMT Right Eval Left Eval  Hip flexion 5 4*  Hip extension    Hip abduction 5 4*  Hip adduction 5 4*  Hip  internal rotation    Hip external rotation    Knee flexion 5 4+  Knee extension 5 4+  Ankle dorsiflexion 5 4  Ankle plantarflexion 5 4+  Ankle inversion    Ankle eversion    (Blank rows = not tested) *MEASURE ON PLINTH AGAINST GRAVITY*  BED MOBILITY:  WNL  TRANSFERS: WNL  RAMP:  Not tested  CURB:  Not Tested  STAIRS: Not tested GAIT: Findings: Gait Characteristics: decreased arm swing- Left, decreased step length- Right, and trendelenburg,  Distance walked: 1050 ft, and Comments: Pain onset 3 min in Left Hip, fatigue onset and visibly slower gait at 5 min  FUNCTIONAL TESTS:  30 seconds chair stand test 11 6 minute walk test: 1050 ft, pain in hip onset at 3 min and visible fatigue and slowed pace at 5 min mark.  : .82 m/s  PATIENT SURVEYS:  LEFS    Survey date:    1: Any of your usual work, housework or school activities 3  2. Usual hobbies, recreational or sporting activities 4  3. Getting into/out of the bath 4  4. Walking between rooms 3  5. Putting on socks/shoes 2  6. Squatting  4  7. Lifting an object, like a bag of groceries from the floor 4  8. Performing light activities around your home 4  9. Performing heavy activities around your home 1  10. Getting into/out of a car 3  11. Walking 2 blocks 2  12. Walking 1 mile 1  13. Going up/down 10 stairs (1 flight) 4  14. Standing for 1 hour 3  15.  sitting for 1 hour 4  16. Running on even ground 2  17. Running on uneven ground 2  18. Making sharp turns while running fast 2  19. Hopping  4  20. Rolling over in bed 3  Score total:  59                                                                                                                                TREATMENT DATE: 12/01/24   The patient is a 34 year old female presenting to physical therapy with persistent deficits following a cerebrovascular accident.   SELF CARE:  Patient instructed in gym based exercises due to fact that patient is  severely limited with insurance restrictions with only 4 approved PT visits. Patient reported her long term plan is to join a gym so wanted some instruction in more gym based activities to improve her overall LE strength. Patient was instructed in the following LE strengthening for gym setting:   In [employer]:  Hip flex (cable) level 2 - 2 x 10 ea LE Hip abd (cable) Level 1 - 2 x 10 reps ea LE Hip ext (cable) Level 1- 2 x 10 reps ea LE Knee ext (level 2) 2 x 10 BLE Knee flex (level 3 ) 2 x 10 reps  BLE Leg press (level 3 for 10 reps and level 5 for 10 reps)  Calf press (level 5) for 10 reps (mild difficulty keeping R foot on footplate)  *No report of increased R hip pain throughout any of activities. Patient required verbal cues for proper technique and posture for decreased risk of injury. Extensive education in how to operate equipment and review of concepts of frequency, duration of all activities.     PATIENT EDUCATION: Education details: POC Person educated: Patient Education method: Explanation Education comprehension: verbalized understanding   HOME EXERCISE  PROGRAM: Access Code: [email or web address] URL: [email or web address] Date: 11/23/2024 Prepared by: Lonni Gainer  Exercises - Sidelying Hip Abduction  - 1 x daily - 7 x weekly - 2 sets - 10 reps - Supine Bridge with Resistance Band  - 1 x daily - 7 x weekly - 3 sets - 10 reps   GOALS: Goals reviewed with patient? Yes  SHORT TERM GOALS: Target date: 12/21/2024       Patient will be independent in home exercise program to improve strength/mobility for better functional independence with ADLs. Baseline: No HEP currently  Goal status: INITIAL   LONG TERM GOALS: Target date: 01/18/2025    1.  Patient will complete 13 or more stands with 30 sec chair stand test indicating an increased LE strength and improved balance. Baseline: 11 Goal status: INITIAL  2.  Patient will improve LEFS score to 69   to  demonstrate statistically significant improvement in mobility and quality of life as it relates to their LE functional capacity.  Baseline: 59 Goal status: INITIAL   3. The patient will ambulate continuously for six minutes without onset of hip pain in order to improve functional walking tolerance and allow safer, sustained participation in household and community mobility tasks. Baseline: 3 min onset and subsequent worsening of pain during Goal status: INITIAL   4.  The patient will demonstrate improved functional hip abduction strength on the involved side, achieving at least four out of five strength on manual muscle testing without compensatory trunk lean, in order to enhance pelvic stability, reduce gait asymmetry, and support safer, more efficient community ambulation. Baseline: 4/5 measured in SL against gravity Goal status: INITIAL  5.   The patient will demonstrate improved gait speed to at least one 1.1 meters per second during overground ambulation, indicating enhanced gait efficiency, improved motor control, and increased safety for community mobility. Baseline: .21m/s Goal status: INITIAL  6.   Patient will increase six minute walk test distance to >1200 ft without onset of hip pain for progression to demonstration of improved functional endurance and improve gait ability Baseline: 1050 - fatigue at 5 min and slowed pace and L hip pain at 3 min with increasing pain throughout Goal status: INITIAL     ASSESSMENT:  CLINICAL IMPRESSION: The patient is a 34 year old female presenting with persistent functional deficits following a cerebrovascular accident. Due to significant insurance limitations allowing only four physical therapy visits, todays session focused on instruction in gym based lower extremity strengthening to support long term independent exercise. The patient demonstrated good tolerance to all prescribed activities without an increase in right hip pain. She  completed hip flexion, hip abduction, hip extension, knee extension, knee flexion, leg press, and calf press exercises using the [employer] equipment with mild difficulty maintaining right foot contact on the calf press footplate. The patient required intermittent verbal cueing for proper positioning and technique to reduce compensations and optimize safety. Extensive education was provided regarding machine setup, movement mechanics, dosage, frequency, and safe progression to support her transition to community based fitness. Continued skilled therapy is indicated to reinforce safe exercise performance, address ongoing strength deficits, and promote improved functional independence.    OBJECTIVE IMPAIRMENTS: Abnormal gait, cardiopulmonary status limiting activity, decreased activity tolerance, decreased balance, decreased coordination, decreased endurance, decreased knowledge of use of DME, decreased mobility, difficulty walking, decreased ROM, decreased strength, hypomobility, increased edema, increased fascial restrictions, increased muscle spasms, impaired flexibility, impaired sensation, impaired tone, impaired UE functional use, improper body mechanics, postural dysfunction,  and pain.   ACTIVITY LIMITATIONS: carrying, lifting, bending, squatting, stairs, transfers, bed mobility, dressing, and locomotion level  PARTICIPATION LIMITATIONS: cleaning, laundry, interpersonal relationship, driving, shopping, community activity, occupation, and yard work  PERSONAL FACTORS: 1-2 comorbidities: hs of CHF and CVA are also affecting patient's functional outcome.   REHAB POTENTIAL: Good  CLINICAL DECISION MAKING: Evolving/moderate complexity  EVALUATION COMPLEXITY: Moderate  PLAN:  PT FREQUENCY: 1x/week  PT DURATION: 12 weeks  PLANNED INTERVENTIONS: 97750- Physical Performance Testing, 97110-Therapeutic exercises, 97530- Therapeutic activity, [id number]- Neuromuscular re-education, 97535- Self Care, [id number]-  Manual therapy, [phone]- Gait training, [phone]- Canalith repositioning, Patient/Family education, Balance training, Stair training, and Vestibular training  PLAN FOR NEXT SESSION:  Reinforce proper technique for the gym based lower extremity strengthening program to support safe transition to independent exercise. Progress LE strength, stability, and endurance while maintaining pain free performance.     Reyes LOISE London, PT 12/01/2024, 12:14 PM        "

## 2024-12-06 ENCOUNTER — Telehealth: Payer: Self-pay

## 2024-12-08 ENCOUNTER — Ambulatory Visit

## 2024-12-08 ENCOUNTER — Ambulatory Visit: Attending: Internal Medicine

## 2024-12-08 DIAGNOSIS — I6930 Unspecified sequelae of cerebral infarction: Secondary | ICD-10-CM

## 2024-12-08 DIAGNOSIS — R278 Other lack of coordination: Secondary | ICD-10-CM

## 2024-12-08 DIAGNOSIS — M25552 Pain in left hip: Secondary | ICD-10-CM

## 2024-12-08 DIAGNOSIS — R262 Difficulty in walking, not elsewhere classified: Secondary | ICD-10-CM

## 2024-12-08 DIAGNOSIS — M6281 Muscle weakness (generalized): Secondary | ICD-10-CM

## 2024-12-08 DIAGNOSIS — R2689 Other abnormalities of gait and mobility: Secondary | ICD-10-CM

## 2024-12-08 DIAGNOSIS — R2681 Unsteadiness on feet: Secondary | ICD-10-CM

## 2024-12-08 NOTE — Therapy (Unsigned)
 " OUTPATIENT OCCUPATIONAL THERAPY NEURO TREATMENT NOTE  Patient Name: Anna Tapia MRN: [id number] DOB:November 09, 1990, 34 y.o., female Today's Date: 12/10/2024  PCP: Dr. Sharyle Fischer  REFERRING PROVIDER: Dr. Sharyle Fischer  END OF SESSION:  OT End of Session - 12/08/24      Visit Number 2    Number of Visits 24     Date for Recertification  02/23/25     Authorization Type [employer]; 1 OT visit from 12/08/24-12/06/24    Progress Note Due on Visit 10     OT Start Time 0845     OT Stop Time 0930     OT Time Calculation (min) 45 min     Activity Tolerance Patient tolerated treatment well     Behavior During Therapy [facility] for tasks assessed/performed     Past Medical History:  Diagnosis Date   Arteriovenous malformation of brain    a. s/p remote coiling.   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   Congenital CHF (congestive heart failure) (HCC)    a. 03/2023 Echo: EF 60-65%, no rwma, nl RV fxn, RVSP 29.7mmHg. Mild MR. Mild Ao sclerosis w/o stenosis. Ao root 38mm.   COVID 2022   Hemorrhagic stroke (HCC) 2023   a. L-sided midbrain, thalamic ICH in setting of PCA/ACA aneurysm s/p embolization.   History of broken collarbone 2020   Hydrocephalus (HCC)    a. s/p VP shunt in childhood.   Precordial chest pain    a. 03/2023 MV: No ischemia or infarct.  EF greater than 65%.  No significant coronary calcification.   Sepsis secondary to UTI (HCC) 2021   Past Surgical History:  Procedure Laterality Date   VENTRICULOPERITONEAL SHUNT     Patient Active Problem List   Diagnosis Date Noted   History of CVA with residual deficit 11/25/2023   CHF (congestive heart failure) (HCC) 03/25/2023   Left-sided nontraumatic intracerebral hemorrhage (HCC) 08/01/2022   AVM (arteriovenous malformation) brain 07/24/2022   Asthma 07/24/2022   Major depressive disorder, recurrent episode, moderate (HCC) 02/07/2022   Sepsis secondary to UTI (HCC) 05/01/2020    VP (ventriculoperitoneal) shunt status 05/01/2020   Right ovarian cyst 05/01/2020   Elevated LFTs 05/01/2020   ONSET DATE: Sept 2023  REFERRING DIAG: I69.30 (ICD-10-CM) - History of CVA with residual deficit   THERAPY DIAG:  Muscle weakness (generalized)  Other lack of coordination  History of CVA with residual deficit  Rationale for Evaluation and Treatment: Rehabilitation  SUBJECTIVE:  SUBJECTIVE STATEMENT: I just got a membership to [employer].  Pt accompanied by: self, aunt  PERTINENT HISTORY: Pt known to this clinic after participation in therapy in 2024 following her L CVA in 2023.  Pt was discharged from OT in Oct of 2024 d/t visit limitations with therapy.  Pt returns today to address RUE residual deficits from CVA after most recent Botox injections on 01/21/24.  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 Yes: NPRS scale: 4-5/10 R shoulder from recent Botox  Pain location: R shoulder  Pain description: achy Aggravating factors: Botox shot Relieving factors: heat, rest  FALLS: Has patient fallen in last 6 months? No  LIVING ENVIRONMENT: Lives with: lives with their family mother and 3 brothers, but mostly staying with her aunt  Lives in: ground level apartment with aunt  Stairs: Yes: External: 3 steps; bilateral but cannot reach both Has following equipment at home: Single point cane  PLOF: Independent prior to CVA  PATIENT GOALS: Using my arm more.   OBJECTIVE:  Note: Objective measures were completed at Evaluation unless otherwise noted.  HAND DOMINANCE: Left  ADLs: updated on 12/01/24:  Overall ADLs: Pt reports  that she tries to engage the R hand into everything she does Transfers/ambulation related to ADLs: indep Eating: Able to stabilize fork with R hand  Grooming: increased time to engage the R  UB Dressing: Increased time with clothing fasteners LB Dressing: wears slip on shoes; increased time with clothing fasteners  Toileting: indep Bathing: shoulder pain when reaching behind head to wash hair, but still able to engage the R with increased effort  Tub Shower transfers: modified indep Equipment: Shower seat with back, Grab bars, and hand held shower hose   IADLs: Shopping: pt can go shopping accompanied by a family member  Light housekeeping: pt reports unable to sweep; pt reports she gets dizzy when looking down   Meal Prep: Pt cooks about 3 nights a week, but has difficulty opening containers/jars/food packages in the [employer] mobility: uses a cane when going to the grocery store for extended periods of walking  Medication management: modified indep; increased time to engage the R hand Financial management: mother manages (pt states she doesn't really have any bills)  Handwriting: NT (pt is L hand dominant)  MOBILITY STATUS: Uses cane for longer distances, but did not bring cane today  POSTURE COMMENTS:  rounded shoulders  ACTIVITY TOLERANCE: Activity tolerance: Modified Borg Scale: 5 (moderate activity) for BADLs; 8 (vigorous activity) for community mobility/grocery store trips  FUNCTIONAL OUTCOME MEASURES: MAM-20 for musculoskeletal conditions: Date: 12/01/24; Score: 54/76 1= cannot do, 2=Very hard to do, 3=A little hard to do, 4=Easy to do Rating Choose only one number (definitions above)  3 Cut nails with a nail clipper  1 2. Tie shoes with laces  4 3. Cut meat on a plate (holds fork in R hand, knife in the L)  4 4. Wring a towel  4 5. Open a medicine bottle with a child proof cap/top   2 6. Zip a jacket  2 7. Button clothes (medium sized buttons)  N/A 8. Write 3-4  lines legibly (L hand dominant)  4 9. Take things/cards out of a wallet   3 10. Open a wide-mouth jar or bottle previously opened   2 11. Handle/count money (bills and coins)  3 12. Pick up a 1/2 full water pitcher  3 13. Turn key (to open a door)  4 14. Squeeze toothpaste onto a toothbrush  2 15. Use spoon or fork  1 16. Brush or comb hair  1 17. Dial or key in telephone numbers  3 18. Brush teeth   4 19. Wash hands  4 20. Use hand(s) to eat a sandwich   54/76      UPPER EXTREMITY ROM:    Active ROM Left Eval 12/01/24 WNL throughout Right OT d/c on 08/12/23 (Previous episode of care) Right Eval on 01/29/24 Right  Eval on  12/01/24   Shoulder flexion  133 (135) 125 (134) 125 (145)  Shoulder abduction  120 (125) 90 (104) 101 (120)  slight scaption  Shoulder adduction      Shoulder extension      Shoulder internal rotation  R thumb to lumbar spine (better clearance of SI joint) To R side of lower back-good clearance of SI joint WFL to mid lower back-good clearance of SI joint  Shoulder external rotation  Hand to back of head without chin tuck and good abd  63* with arm abd (Slight scaption) WFL 85* Slight scaption and chip tuck  Elbow flexion      Elbow extension      Wrist flexion      Wrist extension      Wrist ulnar deviation      Wrist radial deviation      Wrist pronation      Wrist supination      (Blank rows = not tested)  UPPER EXTREMITY MMT:     MMT Left Eval 5/5 throughout Right OT d/c on 08/12/23 (Previous episode of care) Right Eval 01/29/24 Right 11/30/24:  Shoulder flexion  4- 4 4  Shoulder abduction  4- 4 4+  Shoulder adduction      Shoulder extension      Shoulder internal rotation  4 4 4+  Shoulder external rotation  4- 4 4  Middle trapezius      Lower trapezius      Elbow flexion  4+ 4+ 5  Elbow extension  5 5 5   Wrist flexion  4+ 4+ 5  Wrist extension  4+ 4+ 5  Wrist ulnar deviation      Wrist radial deviation      Wrist pronation       Wrist supination      (Blank rows = not tested)  HAND FUNCTION: 08/12/23: Grip strength: Right: 14 lbs, Left 60 lbs; lateral pinch: Right: 6 lbs, Left: 16 lbs , 3 point pinch: Right: 5 (IP flexion; Saehan pinch gauge), Left: 19 lbs  01/29/24 Eval: Grip strength: Right: 18 lbs; Left: 55 lbs, Lateral pinch: Right: 5 lbs, Left: 14 lbs, and 3 point pinch: Right:  Fingers slip- standard pinch gauge used 2 lbs, Left: 15 lbs 12/01/24: Grip strength: Right: 32 lbs (average of 3); Left: 70 lbs, Lateral pinch: Right: 4 lbs, Left: 6 lbs, and 3 point pinch: Right: Fingers slip- standard pinch gauge: 2 lbs, Left: 5 lbs  COORDINATION: 08/12/23: Right: 5 min and 19 sec; 2nd trial 3 min 49 sec, Left 23 sec,  01/29/24 Eval: 9 Hole Peg test: Right: 2 min 41 sec  sec; Left: 23 sec 12/01/24: R: 2 min 37 sec, L 23 sec,   SENSATION: Light touch: Impaired , tingling in the R hand   EDEMA: No visible edema  MUSCLE TONE: RUE: Mild and Moderate (Last round of Botox on 01/21/24)  COGNITION: Overall cognitive status: Within functional limits for tasks assessed  VISION: WFL; no recent changes  PERCEPTION: WFL  PRAXIS: Impaired: Motor planning and Clonus R hand  OBSERVATIONS:  Pt remains very motivated to increase functional use of RUE for daily tasks.  TREATMENT DATE: 12/08/24 Therapeutic Exercise: -Issued FMC/dexterity handout, reviewed with pt, and made recommendations for target activities within list that focus on item storage, translation palm<>fingertips, sustained pinching, and pickig up small items from table top. -Reviewed/completed theraputty exercises targeting R hand strengthening/coordination training: Upgraded to green putty for gross grasping, lateral/2 point/3 point pinching, digit abd/add, and digging coins out of putty.  Min vc for technique to improve quality of movement.  Encouraged  completion 5-10 min, 1-2x per day.    Self Care: -Review of MAM-20 scores with subsequent education provided for AE/compensatory strategies provided as follows:  -Issued built up foam grip for fork>pt to stabilize fork in R hand while cutting food with dominant L hand  -Recommendation for elastic shoe laces and advised on options to obtain.  Encouraged pt practice tying laces bimanually with thicker laces for better prehension/anchored to table top as needed -Issued dycem square for opening tight jars/containers in kitchen.  Pt secures jar in R hand and twists with L.  Practiced with dycem placed around container for R hand stabilization rather than container top; pt found this more effective than dycem on container top. -Advised on button hook/options to obtain  Therapeutic Exercise: -R hand strengthening: Lumbrical strengthening with wide base clothespins.  Instructed in graded lumbrical strengthening, including isometric holds on table edge x20 sec count, tactile cues for form/finger position, lumbrical squeezes into putty, and carrying flat household objects using lumbrical grasp, increasing weight of objects as tolerated.  PATIENT EDUCATION: Education details: HEP progression Person educated: Patient Education method: Explanation, Demonstration, Tactile cues, Verbal cues, and Handouts Education comprehension: verbalized understanding, returned demonstration, verbal cues required, tactile cues required, and needs further education  HOME EXERCISE PROGRAM: Pink theraputty, self passive stretching for R hand digit ext/thumb flex/abd  GOALS:  Goals reviewed with patient? Yes  SHORT TERM GOALS: Target date: 01/12/25  Pt will be indep to perform HEP for improving R hand strength and coordination for daily tasks. Baseline: Eval: Will review and provide adjustments to previous HEP as needed Goal status: INITIAL   LONG TERM GOALS: Target date: 02/23/25  Pt will increase MAM-20 score for  neurological conditions by 6 or more points to indicate improvement in self perceived functional use of the R hand for daily tasks.   Baseline: Eval: 54/76 Goal status: INITIAL  2.  Pt will increase R grip strength by 5 or more lbs to securely stabilize containers and jars for easier opening.  Baseline: Eval: R grip strength 32 lbs (L 70 lbs)  Goal status: INITIAL  3.  Pt will increase R hand dexterity/FMC skills to manipulate clothing fasteners with fair efficiency with bilat hands.  Baseline: Eval: R 9 hole peg test: 2 min 37 sec, (L 23 sec); increased time for clothing fasteners (poor efficiency) Goal status: INITIAL  4.  Pt will increase R 3 point pinch strength by 2 or more lbs without fingers slipping to improve efficiency with tearing open snack packages. Baseline: Eval: R 2 lbs (fingers slip), L 5 lbs Goal status: INITIAL  5.  Pt will increase R hand lumbrical strength to enable engaging R hand into carrying light/flat objects (ie plate, book).  Baseline: Eval: R hand digits 1-3 slip into PIP/DIP flexion; unable to sustain lumbrical position/frequent dropping of flat objects.  Goal status: INITIAL  6.  Pt will increase activity tolerance for IADL tasks as demonstrated by improved RPE on modified Borg scale from 8 to 5 or less.  Baseline: Eval: 8 RPE for IADLs,  requires rest/naps.  Goal status: INITIAL  7.  Pt will be independent to set up and implement at least 1 of 3 tone normalization strategies/modalities listed below in order to reduce flexor tone in  the R hand digits to better engage the R hand into FM components of ADLs (ie: Metaflex glove, Saebostim One unit, or Saebo Glove).  Baseline: 12/08/24: Gloves/stim to be introduced in upcoming sessions  Goals status: New  ASSESSMENT:  CLINICAL IMPRESSION: HEP upgrades made today; min vc/tactile cues required to follow written handouts.  Over the past 2 episodes of care, pt has begun to demo improvements in R hand intrinsic  muscle strength, allowing pt to begin sustaining lumbrical position against very light resistance (~2 lbs) using lumbrical grasp.  Pt requires tactile cues on strategies for continued strength progressions as PIPs flex against stronger resistance.  Further development of intrinsic hand strength will allow better engagement of the R hand into more complex coordination components of daily tasks, including carrying and holding flat objects in R hand and buttoning a shirt.  Pt will continue to benefit from skilled OT to address RUE deficits in order to improve indep and efficiency when engaging the RUE into daily tasks.    PERFORMANCE DEFICITS: in functional skills including ADLs, IADLs, coordination, dexterity, sensation, tone, ROM, strength, pain, flexibility, Fine motor control, Gross motor control, mobility, balance, body mechanics, endurance, decreased knowledge of use of DME, and UE functional use, and psychosocial skills including coping strategies, environmental adaptation, habits, and routines and behaviors.   IMPAIRMENTS: are limiting patient from ADLs, IADLs, work, leisure, and social participation.   CO-MORBIDITIES: has co-morbidities such as CHF, asthma, depression that affects occupational performance. Patient will benefit from skilled OT to address above impairments and improve overall function.  MODIFICATION OR ASSISTANCE TO COMPLETE EVALUATION: No modification of tasks or assist necessary to complete an evaluation.  OT OCCUPATIONAL PROFILE AND HISTORY: Detailed assessment: Review of records and additional review of physical, cognitive, psychosocial history related to current functional performance.  CLINICAL DECISION MAKING: Moderate - several treatment options, min-mod task modification necessary  REHAB POTENTIAL: Good  EVALUATION COMPLEXITY: Moderate    PLAN:  OT FREQUENCY: 1-2x/week  OT DURATION: 12 weeks  PLANNED INTERVENTIONS: 97168 OT Re-evaluation, 97535 self care/ADL  training, 02889 therapeutic exercise, 97530 therapeutic activity, 97112 neuromuscular re-education, 97140 manual therapy, 97010 moist heat, 97010 cryotherapy, passive range of motion, balance training, functional mobility training, psychosocial skills training, energy conservation, coping strategies training, patient/family education, and DME and/or AE instructions  RECOMMENDED OTHER SERVICES: Currently in PT  CONSULTED AND AGREED WITH PLAN OF CARE: Patient  PLAN FOR NEXT SESSION: see above  Inocente Blazing, MS, OTR/L  Inocente MARLA Blazing, OT 12/10/2024, 3:22 PM        "

## 2024-12-08 NOTE — Therapy (Signed)
 " OUTPATIENT PHYSICAL THERAPY NEURO TREATMENT   Patient Name: Anna Tapia MRN: [id number] DOB:[birth year], 34 y.o., female Today's Date: 12/08/2024   PCP: Bernardo Fend, DO  REFERRING PROVIDER: Bernardo Fend, DO   END OF SESSION:  PT End of Session - 12/08/24 1019     Visit Number 3    Number of Visits 12    Date for Recertification  02/15/25    Authorization Type [employer]    Authorization Time Period 1/28-2/26/2026    Authorization - Number of Visits 4    Progress Note Due on Visit 10    PT Start Time 1019    PT Stop Time 1100    PT Time Calculation (min) 41 min    Equipment Utilized During Treatment Gait belt    Activity Tolerance Patient tolerated treatment well;No increased pain    Behavior During Therapy WFL for tasks assessed/performed           Past Medical History:  Diagnosis Date   Arteriovenous malformation of brain    a. s/p remote coiling.   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   Congenital CHF (congestive heart failure) (HCC)    a. 03/2023 Echo: EF 60-65%, no rwma, nl RV fxn, RVSP 29.49mmHg. Mild MR. Mild Ao sclerosis w/o stenosis. Ao root 38mm.   COVID 2022   Hemorrhagic stroke (HCC) 2023   a. L-sided midbrain, thalamic ICH in setting of PCA/ACA aneurysm s/p embolization.   History of broken collarbone 2020   Hydrocephalus (HCC)    a. s/p VP shunt in childhood.   Precordial chest pain    a. 03/2023 MV: No ischemia or infarct.  EF greater than 65%.  No significant coronary calcification.   Sepsis secondary to UTI (HCC) 2021   Past Surgical History:  Procedure Laterality Date   VENTRICULOPERITONEAL SHUNT     Patient Active Problem List   Diagnosis Date Noted   History of CVA with residual deficit 11/25/2023   CHF (congestive heart failure) (HCC) 03/25/2023   Left-sided nontraumatic intracerebral hemorrhage (HCC) 08/01/2022   AVM (arteriovenous malformation) brain 07/24/2022   Asthma 07/24/2022    Major depressive disorder, recurrent episode, moderate (HCC) 02/07/2022   Sepsis secondary to UTI (HCC) 05/01/2020   VP (ventriculoperitoneal) shunt status 05/01/2020   Right ovarian cyst 05/01/2020   Elevated LFTs 05/01/2020    ONSET DATE: 07/29/22  REFERRING DIAG: I69.30 (ICD-10-CM) - History of CVA with residual deficit   THERAPY DIAG:  Muscle weakness (generalized)  History of CVA with residual deficit  Difficulty in walking, not elsewhere classified  Unsteadiness on feet  Other abnormalities of gait and mobility  Pain in left hip  Balance disorder  Rationale for Evaluation and Treatment: Rehabilitation  SUBJECTIVE:  SUBJECTIVE STATEMENT:  Pt reports no new complaints today upon arrival.  Pt reports she is planning on joining [employer] on Friday.    Pt accompanied by: self  PERTINENT HISTORY:  From recent MD visit:  [name] presents to follow up on chronic medical conditions. Having some knee pain today, had been doing well with PT but her insurance is no longer paying for this until the new year.    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  Also on Lyrica 50 mg BID  -Difficulty using using right hand and right side of face - completed home PT/OT but interested in continuing with therapy outside the home.  Hx of CHF as a newborn: -Had been following with Cardiology at [facility], last seen in 2018 -Last echo 9/23 EF 58% -Denies  chest pain, palpitations, shortness of breath or lower extremity swelling  PAIN:  Are you having pain? Yes: NPRS scale: 2/10 resting - 5/10 with activity  Pain location: R hip post region along gluteal musculature  Pain description: dull pain  Aggravating factors: moving arm  Relieving factors: n/a   PRECAUTIONS: Fall  RED FLAGS: None   WEIGHT BEARING RESTRICTIONS: No  FALLS: Has patient fallen in last 6 months? No  LIVING ENVIRONMENT: Lives with: lives with their family Lives in: House/apartment Stairs: Yes: External: 2 steps; on right going up and on left going up Has following equipment at home: Single point cane  PLOF: Independent, Independent with basic ADLs, and prior to CVA   PATIENT GOALS: Improve endurance, improve hip strength and consequently reduce pain   OBJECTIVE:  Note: Objective measures were completed at Evaluation unless otherwise noted.  Diagnostic findings:  CT 2023 IMPRESSION: 1. 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 Again, prominent arterial contribution to the AVM is seen from the left ACA and posterior circulation, with primary venous drainage into the deep venous system. Associated 7 mm aneurysm along the right anterolateral aspect of the AVM as above. 2. Diffuse tortuosity and ectasia elsewhere about the major arterial vasculature of the head and neck. No large vessel occlusion or hemodynamically significant stenosis. No other acute vascular abnormality.   COGNITION: Overall cognitive status: Within functional limits for tasks assessed   SENSATION: Not tested   POSTURE: weight shift right  LOWER EXTREMITY ROM:     WNL for tasks assessed  LOWER EXTREMITY MMT:    *MEASURE ON PLINTH AGAINST GRAVITY* MMT Right Eval Left Eval  Hip flexion 5 4*  Hip extension    Hip abduction 5 4*  Hip adduction 5 4*  Hip internal rotation    Hip external rotation    Knee flexion 5 4+   Knee extension 5 4+  Ankle dorsiflexion 5 4  Ankle plantarflexion 5 4+  Ankle inversion    Ankle eversion    (Blank rows = not tested) *MEASURE ON PLINTH AGAINST GRAVITY*  BED MOBILITY:  WNL  TRANSFERS: WNL  RAMP:  Not tested  CURB:  Not Tested  STAIRS: Not tested GAIT: Findings: Gait Characteristics: decreased arm swing- Left, decreased step length- Right, and trendelenburg,  Distance walked: 1050 ft, and Comments: Pain onset 3 min in Left Hip, fatigue onset and visibly slower gait at 5 min  FUNCTIONAL TESTS:  30 seconds chair stand test 11 6 minute walk test: 1050 ft, pain in hip onset at 3 min and visible fatigue and slowed pace at 5 min mark.  : .82 m/s  PATIENT SURVEYS:  LEFS    Survey date:    1: Any of your usual work, housework or school activities 3  2. Usual hobbies, recreational or sporting activities 4  3. Getting into/out of the bath 4  4. Walking between rooms 3  5. Putting on socks/shoes 2  6. Squatting  4  7. Lifting an object, like a bag of groceries from the floor 4  8. Performing light activities around your home 4  9. Performing heavy activities around your home 1  10. Getting into/out of a car 3  11. Walking 2 blocks 2  12. Walking 1 mile 1  13. Going up/down 10 stairs (1 flight) 4  14. Standing for 1 hour 3  15.  sitting for 1 hour 4  16. Running on even ground 2  17. Running on uneven ground 2  18. Making sharp turns while running fast 2  19. Hopping  4  20. Rolling over in bed 3  Score total:  59                                                                                                                                TREATMENT DATE: 12/08/24   TherEx: Patient was instructed in the following LE strengthening for gym setting:   In [employer]:  Seated leg extensions, 3 plates, 7k89  Seated hamstring curls, 3 plates, 7k89  Seated leg press, 8 plates, 7k89  Hip flex (cable) level 3, 2 x 10 ea LE Hip abd (cable) Level 3, 2  x 10 reps ea LE Hip ext (cable) Level 3, 2 x 10 reps ea LE    PATIENT EDUCATION: Education details: POC Person educated: Patient Education method: Explanation Education comprehension: verbalized understanding   HOME EXERCISE PROGRAM: Access Code: [email or web address] URL: [email or web address] Date: 12/08/2024 Prepared by: Sidra Simpers  Exercises - Full Leg Press  - 1 x daily - 7 x weekly - 3 sets - 10 reps - Hamstring Curl with Weight Machine  - 1 x daily - 7 x weekly - 3 sets - 10 reps - Knee Extension with Weight Machine  - 1 x daily - 7 x weekly - 3 sets - 10 reps - Hip Abduction Machine  - 1 x daily - 7 x weekly - 3 sets - 10 reps - Hip Adduction Machine  - 1 x daily - 7 x weekly - 3 sets - 10 reps   GOALS: Goals reviewed with patient? Yes  SHORT TERM GOALS: Target date: 12/21/2024   Patient will be independent in home exercise program to improve strength/mobility for  better functional independence with ADLs. Baseline: No HEP currently  Goal status: INITIAL   LONG TERM GOALS: Target date: 01/18/2025  1.  Patient will complete 13 or more stands with 30 sec chair stand test indicating an increased LE strength and improved balance. Baseline: 11 Goal status: INITIAL  2.  Patient will improve LEFS score to 69   to demonstrate statistically significant improvement in mobility and quality of life as it relates to their LE functional capacity.  Baseline: 59 Goal status: INITIAL   3. The patient will ambulate continuously for six minutes without onset of hip pain in order to improve functional walking tolerance and allow safer, sustained participation in household and community mobility tasks. Baseline: 3 min onset and subsequent worsening of pain during Goal status: INITIAL   4.  The patient will demonstrate improved functional hip abduction strength on the involved side, achieving at least four out of five strength on manual muscle testing without compensatory  trunk lean, in order to enhance pelvic stability, reduce gait asymmetry, and support safer, more efficient community ambulation. Baseline: 4/5 measured in SL against gravity Goal status: INITIAL  5.  The patient will demonstrate improved gait speed to at least one 1.1 meters per second during overground ambulation, indicating enhanced gait efficiency, improved motor control, and increased safety for community mobility. Baseline: .29m/s Goal status: INITIAL  6.  Patient will increase six minute walk test distance to >1200 ft without onset of hip pain for progression to demonstration of improved functional endurance and improve gait ability Baseline: 1050 - fatigue at 5 min and slowed pace and L hip pain at 3 min with increasing pain throughout Goal status: INITIAL     ASSESSMENT:  CLINICAL IMPRESSION:  Pt responded well to the exercises and put forth great effort throughout the session.  Pt was given gym-based exercises and a print out for gym use.  Pt intends on performing exercises at the gym starting on Friday when she gets her membership.  Pt encouraged to take pictures and ask questions in regards to the exercise equipment at [employer] in order for therapist to assist.  Pt agreeable and will continue to improve moving forward with limited visits.   Pt will continue to benefit from skilled therapy to address remaining deficits in order to improve overall QoL and return to PLOF.       OBJECTIVE IMPAIRMENTS: Abnormal gait, cardiopulmonary status limiting activity, decreased activity tolerance, decreased balance, decreased coordination, decreased endurance, decreased knowledge of use of DME, decreased mobility, difficulty walking, decreased ROM, decreased strength, hypomobility, increased edema, increased fascial restrictions, increased muscle spasms, impaired flexibility, impaired sensation, impaired tone, impaired UE functional use, improper body mechanics, postural dysfunction, and  pain.   ACTIVITY LIMITATIONS: carrying, lifting, bending, squatting, stairs, transfers, bed mobility, dressing, and locomotion level  PARTICIPATION LIMITATIONS: cleaning, laundry, interpersonal relationship, driving, shopping, community activity, occupation, and yard work  PERSONAL FACTORS: 1-2 comorbidities: hs of CHF and CVA are also affecting patient's functional outcome.   REHAB POTENTIAL: Good  CLINICAL DECISION MAKING: Evolving/moderate complexity  EVALUATION COMPLEXITY: Moderate  PLAN:  PT FREQUENCY: 1x/week  PT DURATION: 12 weeks  PLANNED INTERVENTIONS: 97750- Physical Performance Testing, 97110-Therapeutic exercises, 97530- Therapeutic activity, [id number]- Neuromuscular re-education, 97535- Self Care, [id number]- Manual therapy, [phone]- Gait training, [phone]- Canalith repositioning, Patient/Family education, Balance training, Stair training, and Vestibular training  PLAN FOR NEXT SESSION:    Reinforce proper technique for the gym based lower extremity strengthening program to support safe transition to  independent exercise. Progress LE strength, stability, and endurance while maintaining pain free performance.     Fonda Simpers, PT, DPT Physical Therapist - [facility]  12/08/24, 10:20 AM  "

## 2024-12-09 ENCOUNTER — Ambulatory Visit

## 2024-12-09 ENCOUNTER — Ambulatory Visit: Admitting: Occupational Therapy

## 2024-12-15 ENCOUNTER — Ambulatory Visit

## 2024-12-16 ENCOUNTER — Ambulatory Visit

## 2024-12-17 ENCOUNTER — Telehealth: Payer: Self-pay

## 2024-12-23 ENCOUNTER — Ambulatory Visit

## 2024-12-23 ENCOUNTER — Ambulatory Visit: Admitting: Physical Therapy

## 2024-12-24 ENCOUNTER — Ambulatory Visit: Admitting: Internal Medicine

## 2024-12-30 ENCOUNTER — Ambulatory Visit

## 2025-01-06 ENCOUNTER — Ambulatory Visit

## 2025-01-06 ENCOUNTER — Ambulatory Visit: Attending: Internal Medicine

## 2025-01-10 ENCOUNTER — Ambulatory Visit

## 2025-01-13 ENCOUNTER — Ambulatory Visit

## 2025-01-20 ENCOUNTER — Ambulatory Visit

## 2025-01-20 ENCOUNTER — Ambulatory Visit: Admitting: Physical Therapy

## 2025-01-27 ENCOUNTER — Ambulatory Visit

## 2025-02-03 ENCOUNTER — Ambulatory Visit: Attending: Internal Medicine | Admitting: Physical Therapy

## 2025-02-03 ENCOUNTER — Ambulatory Visit

## 2025-02-10 ENCOUNTER — Ambulatory Visit

## 2025-02-10 ENCOUNTER — Ambulatory Visit: Admitting: Physical Therapy

## 2025-02-17 ENCOUNTER — Ambulatory Visit

## 2025-02-17 ENCOUNTER — Ambulatory Visit: Admitting: Physical Therapy

## 2025-02-24 ENCOUNTER — Ambulatory Visit: Admitting: Physical Therapy

## 2025-02-24 ENCOUNTER — Ambulatory Visit

## 2025-03-03 ENCOUNTER — Ambulatory Visit

## 2025-03-03 ENCOUNTER — Ambulatory Visit: Admitting: Physical Therapy

## 2025-03-10 ENCOUNTER — Ambulatory Visit: Attending: Internal Medicine | Admitting: Physical Therapy

## 2025-03-10 ENCOUNTER — Ambulatory Visit

## 2025-03-17 ENCOUNTER — Ambulatory Visit

## 2025-03-17 ENCOUNTER — Ambulatory Visit: Admitting: Physical Therapy

## 2025-03-24 ENCOUNTER — Ambulatory Visit

## 2025-03-24 ENCOUNTER — Ambulatory Visit: Admitting: Physical Therapy
